# Patient Record
Sex: Female | Born: 1940 | Race: White | Hispanic: No | Smoking: Never smoker
Health system: Southern US, Community
[De-identification: ages and names within clinical notes are randomized; demographics above are authoritative.]

## PROBLEM LIST (undated history)

## (undated) DIAGNOSIS — J45909 Unspecified asthma, uncomplicated: Secondary | ICD-10-CM

## (undated) DIAGNOSIS — R519 Headache, unspecified: Secondary | ICD-10-CM

## (undated) DIAGNOSIS — R0600 Dyspnea, unspecified: Secondary | ICD-10-CM

## (undated) DIAGNOSIS — G4733 Obstructive sleep apnea (adult) (pediatric): Secondary | ICD-10-CM

## (undated) DIAGNOSIS — M1990 Unspecified osteoarthritis, unspecified site: Secondary | ICD-10-CM

## (undated) DIAGNOSIS — I10 Essential (primary) hypertension: Secondary | ICD-10-CM

## (undated) DIAGNOSIS — K219 Gastro-esophageal reflux disease without esophagitis: Secondary | ICD-10-CM

## (undated) DIAGNOSIS — N179 Acute kidney failure, unspecified: Secondary | ICD-10-CM

## (undated) DIAGNOSIS — I509 Heart failure, unspecified: Secondary | ICD-10-CM

## (undated) DIAGNOSIS — I480 Paroxysmal atrial fibrillation: Secondary | ICD-10-CM

## (undated) DIAGNOSIS — G8929 Other chronic pain: Secondary | ICD-10-CM

## (undated) DIAGNOSIS — E6609 Other obesity due to excess calories: Secondary | ICD-10-CM

## (undated) DIAGNOSIS — E66812 Other obesity due to excess calories: Secondary | ICD-10-CM

## (undated) HISTORY — PX: ABDOMINAL SURGERY: SHX537

## (undated) HISTORY — PX: CHOLECYSTECTOMY: SHX55

---

## 2000-12-02 ENCOUNTER — Encounter: Payer: Self-pay | Admitting: Family Medicine

## 2000-12-02 ENCOUNTER — Encounter: Admission: RE | Admit: 2000-12-02 | Discharge: 2000-12-02 | Payer: Self-pay | Admitting: Family Medicine

## 2001-01-27 ENCOUNTER — Ambulatory Visit (HOSPITAL_COMMUNITY): Admission: RE | Admit: 2001-01-27 | Discharge: 2001-01-27 | Payer: Self-pay | Admitting: Gastroenterology

## 2005-04-07 ENCOUNTER — Encounter: Admission: RE | Admit: 2005-04-07 | Discharge: 2005-04-07 | Payer: Self-pay | Admitting: Family Medicine

## 2012-12-03 ENCOUNTER — Other Ambulatory Visit: Payer: Self-pay | Admitting: Rehabilitation

## 2012-12-03 DIAGNOSIS — M542 Cervicalgia: Secondary | ICD-10-CM

## 2012-12-18 ENCOUNTER — Ambulatory Visit
Admission: RE | Admit: 2012-12-18 | Discharge: 2012-12-18 | Disposition: A | Discharge status: Home / Self Care | Payer: Medicare Other | Source: Ambulatory Visit | Attending: Rehabilitation | Admitting: Rehabilitation

## 2013-04-05 ENCOUNTER — Ambulatory Visit
Admission: RE | Admit: 2013-04-05 | Discharge: 2013-04-05 | Disposition: A | Discharge status: Home / Self Care | Payer: Medicare Other | Source: Ambulatory Visit | Attending: Family Medicine | Admitting: Family Medicine

## 2013-04-05 ENCOUNTER — Other Ambulatory Visit: Payer: Self-pay | Admitting: Family Medicine

## 2013-04-05 DIAGNOSIS — R51 Headache: Secondary | ICD-10-CM

## 2013-10-17 DIAGNOSIS — J449 Chronic obstructive pulmonary disease, unspecified: Secondary | ICD-10-CM | POA: Diagnosis not present

## 2013-10-17 DIAGNOSIS — J45901 Unspecified asthma with (acute) exacerbation: Secondary | ICD-10-CM | POA: Diagnosis not present

## 2013-10-17 DIAGNOSIS — K219 Gastro-esophageal reflux disease without esophagitis: Secondary | ICD-10-CM | POA: Diagnosis not present

## 2013-10-17 DIAGNOSIS — J479 Bronchiectasis, uncomplicated: Secondary | ICD-10-CM | POA: Diagnosis not present

## 2013-10-17 DIAGNOSIS — R059 Cough, unspecified: Secondary | ICD-10-CM | POA: Diagnosis not present

## 2013-11-04 DIAGNOSIS — S46819A Strain of other muscles, fascia and tendons at shoulder and upper arm level, unspecified arm, initial encounter: Secondary | ICD-10-CM | POA: Diagnosis not present

## 2013-11-04 DIAGNOSIS — S43499A Other sprain of unspecified shoulder joint, initial encounter: Secondary | ICD-10-CM | POA: Diagnosis not present

## 2013-11-14 DIAGNOSIS — F411 Generalized anxiety disorder: Secondary | ICD-10-CM | POA: Diagnosis not present

## 2013-11-14 DIAGNOSIS — J449 Chronic obstructive pulmonary disease, unspecified: Secondary | ICD-10-CM | POA: Diagnosis not present

## 2013-11-14 DIAGNOSIS — E669 Obesity, unspecified: Secondary | ICD-10-CM | POA: Diagnosis not present

## 2013-11-14 DIAGNOSIS — I10 Essential (primary) hypertension: Secondary | ICD-10-CM | POA: Diagnosis not present

## 2013-11-23 DIAGNOSIS — R51 Headache: Secondary | ICD-10-CM | POA: Diagnosis not present

## 2013-11-23 DIAGNOSIS — M4310 Spondylolisthesis, site unspecified: Secondary | ICD-10-CM | POA: Diagnosis not present

## 2013-11-23 DIAGNOSIS — M542 Cervicalgia: Secondary | ICD-10-CM | POA: Diagnosis not present

## 2013-12-14 DIAGNOSIS — E669 Obesity, unspecified: Secondary | ICD-10-CM | POA: Diagnosis not present

## 2013-12-15 DIAGNOSIS — G4733 Obstructive sleep apnea (adult) (pediatric): Secondary | ICD-10-CM | POA: Diagnosis not present

## 2013-12-15 DIAGNOSIS — J449 Chronic obstructive pulmonary disease, unspecified: Secondary | ICD-10-CM | POA: Diagnosis not present

## 2013-12-15 DIAGNOSIS — K219 Gastro-esophageal reflux disease without esophagitis: Secondary | ICD-10-CM | POA: Diagnosis not present

## 2013-12-15 DIAGNOSIS — J45901 Unspecified asthma with (acute) exacerbation: Secondary | ICD-10-CM | POA: Diagnosis not present

## 2013-12-21 DIAGNOSIS — M47812 Spondylosis without myelopathy or radiculopathy, cervical region: Secondary | ICD-10-CM | POA: Diagnosis not present

## 2013-12-21 DIAGNOSIS — M542 Cervicalgia: Secondary | ICD-10-CM | POA: Diagnosis not present

## 2013-12-21 DIAGNOSIS — M67919 Unspecified disorder of synovium and tendon, unspecified shoulder: Secondary | ICD-10-CM | POA: Diagnosis not present

## 2013-12-21 DIAGNOSIS — M719 Bursopathy, unspecified: Secondary | ICD-10-CM | POA: Diagnosis not present

## 2014-01-25 DIAGNOSIS — L570 Actinic keratosis: Secondary | ICD-10-CM | POA: Diagnosis not present

## 2014-01-25 DIAGNOSIS — L723 Sebaceous cyst: Secondary | ICD-10-CM | POA: Diagnosis not present

## 2014-01-25 DIAGNOSIS — M4712 Other spondylosis with myelopathy, cervical region: Secondary | ICD-10-CM | POA: Diagnosis not present

## 2014-01-25 DIAGNOSIS — Q828 Other specified congenital malformations of skin: Secondary | ICD-10-CM | POA: Diagnosis not present

## 2014-01-25 DIAGNOSIS — D239 Other benign neoplasm of skin, unspecified: Secondary | ICD-10-CM | POA: Diagnosis not present

## 2014-01-25 DIAGNOSIS — L821 Other seborrheic keratosis: Secondary | ICD-10-CM | POA: Diagnosis not present

## 2014-01-25 DIAGNOSIS — L719 Rosacea, unspecified: Secondary | ICD-10-CM | POA: Diagnosis not present

## 2014-01-25 DIAGNOSIS — D219 Benign neoplasm of connective and other soft tissue, unspecified: Secondary | ICD-10-CM | POA: Diagnosis not present

## 2014-01-25 DIAGNOSIS — D692 Other nonthrombocytopenic purpura: Secondary | ICD-10-CM | POA: Diagnosis not present

## 2014-01-25 DIAGNOSIS — D1801 Hemangioma of skin and subcutaneous tissue: Secondary | ICD-10-CM | POA: Diagnosis not present

## 2014-01-26 DIAGNOSIS — G4733 Obstructive sleep apnea (adult) (pediatric): Secondary | ICD-10-CM | POA: Diagnosis not present

## 2014-01-26 DIAGNOSIS — J449 Chronic obstructive pulmonary disease, unspecified: Secondary | ICD-10-CM | POA: Diagnosis not present

## 2014-01-26 DIAGNOSIS — J479 Bronchiectasis, uncomplicated: Secondary | ICD-10-CM | POA: Diagnosis not present

## 2014-02-16 DIAGNOSIS — M542 Cervicalgia: Secondary | ICD-10-CM | POA: Diagnosis not present

## 2014-02-16 DIAGNOSIS — M47812 Spondylosis without myelopathy or radiculopathy, cervical region: Secondary | ICD-10-CM | POA: Diagnosis not present

## 2014-02-16 DIAGNOSIS — IMO0002 Reserved for concepts with insufficient information to code with codable children: Secondary | ICD-10-CM | POA: Diagnosis not present

## 2014-02-16 DIAGNOSIS — M75100 Unspecified rotator cuff tear or rupture of unspecified shoulder, not specified as traumatic: Secondary | ICD-10-CM | POA: Diagnosis not present

## 2014-02-20 DIAGNOSIS — M47812 Spondylosis without myelopathy or radiculopathy, cervical region: Secondary | ICD-10-CM | POA: Diagnosis not present

## 2014-02-20 DIAGNOSIS — M25519 Pain in unspecified shoulder: Secondary | ICD-10-CM | POA: Diagnosis not present

## 2014-02-22 DIAGNOSIS — Z1231 Encounter for screening mammogram for malignant neoplasm of breast: Secondary | ICD-10-CM | POA: Diagnosis not present

## 2014-02-22 DIAGNOSIS — M47812 Spondylosis without myelopathy or radiculopathy, cervical region: Secondary | ICD-10-CM | POA: Diagnosis not present

## 2014-02-22 DIAGNOSIS — M25519 Pain in unspecified shoulder: Secondary | ICD-10-CM | POA: Diagnosis not present

## 2014-02-23 DIAGNOSIS — Z0000 Encounter for general adult medical examination without abnormal findings: Secondary | ICD-10-CM | POA: Diagnosis not present

## 2014-02-27 DIAGNOSIS — M47812 Spondylosis without myelopathy or radiculopathy, cervical region: Secondary | ICD-10-CM | POA: Diagnosis not present

## 2014-02-27 DIAGNOSIS — M25519 Pain in unspecified shoulder: Secondary | ICD-10-CM | POA: Diagnosis not present

## 2014-03-01 DIAGNOSIS — J449 Chronic obstructive pulmonary disease, unspecified: Secondary | ICD-10-CM | POA: Diagnosis not present

## 2014-03-01 DIAGNOSIS — G4733 Obstructive sleep apnea (adult) (pediatric): Secondary | ICD-10-CM | POA: Diagnosis not present

## 2014-03-02 DIAGNOSIS — M25519 Pain in unspecified shoulder: Secondary | ICD-10-CM | POA: Diagnosis not present

## 2014-03-02 DIAGNOSIS — M47812 Spondylosis without myelopathy or radiculopathy, cervical region: Secondary | ICD-10-CM | POA: Diagnosis not present

## 2014-03-08 DIAGNOSIS — M47812 Spondylosis without myelopathy or radiculopathy, cervical region: Secondary | ICD-10-CM | POA: Diagnosis not present

## 2014-03-08 DIAGNOSIS — M25519 Pain in unspecified shoulder: Secondary | ICD-10-CM | POA: Diagnosis not present

## 2014-03-10 DIAGNOSIS — M47812 Spondylosis without myelopathy or radiculopathy, cervical region: Secondary | ICD-10-CM | POA: Diagnosis not present

## 2014-03-10 DIAGNOSIS — M25519 Pain in unspecified shoulder: Secondary | ICD-10-CM | POA: Diagnosis not present

## 2014-03-13 DIAGNOSIS — M25519 Pain in unspecified shoulder: Secondary | ICD-10-CM | POA: Diagnosis not present

## 2014-03-13 DIAGNOSIS — M47812 Spondylosis without myelopathy or radiculopathy, cervical region: Secondary | ICD-10-CM | POA: Diagnosis not present

## 2014-03-15 DIAGNOSIS — M47812 Spondylosis without myelopathy or radiculopathy, cervical region: Secondary | ICD-10-CM | POA: Diagnosis not present

## 2014-03-15 DIAGNOSIS — M25519 Pain in unspecified shoulder: Secondary | ICD-10-CM | POA: Diagnosis not present

## 2014-03-21 DIAGNOSIS — M25519 Pain in unspecified shoulder: Secondary | ICD-10-CM | POA: Diagnosis not present

## 2014-03-21 DIAGNOSIS — M47812 Spondylosis without myelopathy or radiculopathy, cervical region: Secondary | ICD-10-CM | POA: Diagnosis not present

## 2014-03-24 DIAGNOSIS — M47812 Spondylosis without myelopathy or radiculopathy, cervical region: Secondary | ICD-10-CM | POA: Diagnosis not present

## 2014-03-24 DIAGNOSIS — M25519 Pain in unspecified shoulder: Secondary | ICD-10-CM | POA: Diagnosis not present

## 2014-03-27 DIAGNOSIS — M47812 Spondylosis without myelopathy or radiculopathy, cervical region: Secondary | ICD-10-CM | POA: Diagnosis not present

## 2014-03-27 DIAGNOSIS — M25519 Pain in unspecified shoulder: Secondary | ICD-10-CM | POA: Diagnosis not present

## 2014-03-29 DIAGNOSIS — M25519 Pain in unspecified shoulder: Secondary | ICD-10-CM | POA: Diagnosis not present

## 2014-03-29 DIAGNOSIS — M47812 Spondylosis without myelopathy or radiculopathy, cervical region: Secondary | ICD-10-CM | POA: Diagnosis not present

## 2014-03-30 DIAGNOSIS — M47812 Spondylosis without myelopathy or radiculopathy, cervical region: Secondary | ICD-10-CM | POA: Diagnosis not present

## 2014-03-30 DIAGNOSIS — M67919 Unspecified disorder of synovium and tendon, unspecified shoulder: Secondary | ICD-10-CM | POA: Diagnosis not present

## 2014-03-30 DIAGNOSIS — M542 Cervicalgia: Secondary | ICD-10-CM | POA: Diagnosis not present

## 2014-03-30 DIAGNOSIS — M719 Bursopathy, unspecified: Secondary | ICD-10-CM | POA: Diagnosis not present

## 2014-04-03 DIAGNOSIS — M47812 Spondylosis without myelopathy or radiculopathy, cervical region: Secondary | ICD-10-CM | POA: Diagnosis not present

## 2014-04-03 DIAGNOSIS — M25519 Pain in unspecified shoulder: Secondary | ICD-10-CM | POA: Diagnosis not present

## 2014-04-05 DIAGNOSIS — M25519 Pain in unspecified shoulder: Secondary | ICD-10-CM | POA: Diagnosis not present

## 2014-04-05 DIAGNOSIS — M47812 Spondylosis without myelopathy or radiculopathy, cervical region: Secondary | ICD-10-CM | POA: Diagnosis not present

## 2014-04-10 DIAGNOSIS — M47812 Spondylosis without myelopathy or radiculopathy, cervical region: Secondary | ICD-10-CM | POA: Diagnosis not present

## 2014-04-10 DIAGNOSIS — M25519 Pain in unspecified shoulder: Secondary | ICD-10-CM | POA: Diagnosis not present

## 2014-04-12 DIAGNOSIS — M25519 Pain in unspecified shoulder: Secondary | ICD-10-CM | POA: Diagnosis not present

## 2014-04-12 DIAGNOSIS — M47812 Spondylosis without myelopathy or radiculopathy, cervical region: Secondary | ICD-10-CM | POA: Diagnosis not present

## 2014-04-17 DIAGNOSIS — M25519 Pain in unspecified shoulder: Secondary | ICD-10-CM | POA: Diagnosis not present

## 2014-04-17 DIAGNOSIS — M47812 Spondylosis without myelopathy or radiculopathy, cervical region: Secondary | ICD-10-CM | POA: Diagnosis not present

## 2014-04-19 DIAGNOSIS — M25519 Pain in unspecified shoulder: Secondary | ICD-10-CM | POA: Diagnosis not present

## 2014-04-19 DIAGNOSIS — M47812 Spondylosis without myelopathy or radiculopathy, cervical region: Secondary | ICD-10-CM | POA: Diagnosis not present

## 2014-04-26 DIAGNOSIS — M4712 Other spondylosis with myelopathy, cervical region: Secondary | ICD-10-CM | POA: Diagnosis not present

## 2014-04-27 DIAGNOSIS — Z780 Asymptomatic menopausal state: Secondary | ICD-10-CM | POA: Diagnosis not present

## 2014-05-10 DIAGNOSIS — R51 Headache: Secondary | ICD-10-CM | POA: Diagnosis not present

## 2014-05-10 DIAGNOSIS — IMO0001 Reserved for inherently not codable concepts without codable children: Secondary | ICD-10-CM | POA: Diagnosis not present

## 2014-05-10 DIAGNOSIS — G518 Other disorders of facial nerve: Secondary | ICD-10-CM | POA: Diagnosis not present

## 2014-05-10 DIAGNOSIS — M542 Cervicalgia: Secondary | ICD-10-CM | POA: Diagnosis not present

## 2014-05-10 DIAGNOSIS — M4712 Other spondylosis with myelopathy, cervical region: Secondary | ICD-10-CM | POA: Diagnosis not present

## 2014-05-24 DIAGNOSIS — M4712 Other spondylosis with myelopathy, cervical region: Secondary | ICD-10-CM | POA: Diagnosis not present

## 2014-05-24 DIAGNOSIS — G518 Other disorders of facial nerve: Secondary | ICD-10-CM | POA: Diagnosis not present

## 2014-05-24 DIAGNOSIS — M542 Cervicalgia: Secondary | ICD-10-CM | POA: Diagnosis not present

## 2014-05-24 DIAGNOSIS — IMO0001 Reserved for inherently not codable concepts without codable children: Secondary | ICD-10-CM | POA: Diagnosis not present

## 2014-05-24 DIAGNOSIS — R51 Headache: Secondary | ICD-10-CM | POA: Diagnosis not present

## 2014-06-01 ENCOUNTER — Other Ambulatory Visit: Payer: Self-pay | Admitting: Rehabilitation

## 2014-06-01 DIAGNOSIS — M545 Low back pain, unspecified: Secondary | ICD-10-CM

## 2014-06-01 DIAGNOSIS — M67919 Unspecified disorder of synovium and tendon, unspecified shoulder: Secondary | ICD-10-CM | POA: Diagnosis not present

## 2014-06-01 DIAGNOSIS — M47817 Spondylosis without myelopathy or radiculopathy, lumbosacral region: Secondary | ICD-10-CM | POA: Diagnosis not present

## 2014-06-01 DIAGNOSIS — M719 Bursopathy, unspecified: Secondary | ICD-10-CM | POA: Diagnosis not present

## 2014-06-01 DIAGNOSIS — M47812 Spondylosis without myelopathy or radiculopathy, cervical region: Secondary | ICD-10-CM | POA: Diagnosis not present

## 2014-06-02 DIAGNOSIS — E669 Obesity, unspecified: Secondary | ICD-10-CM | POA: Diagnosis not present

## 2014-06-02 DIAGNOSIS — Z23 Encounter for immunization: Secondary | ICD-10-CM | POA: Diagnosis not present

## 2014-06-02 DIAGNOSIS — J449 Chronic obstructive pulmonary disease, unspecified: Secondary | ICD-10-CM | POA: Diagnosis not present

## 2014-06-02 DIAGNOSIS — Z0000 Encounter for general adult medical examination without abnormal findings: Secondary | ICD-10-CM | POA: Diagnosis not present

## 2014-06-02 DIAGNOSIS — I10 Essential (primary) hypertension: Secondary | ICD-10-CM | POA: Diagnosis not present

## 2014-06-02 DIAGNOSIS — F411 Generalized anxiety disorder: Secondary | ICD-10-CM | POA: Diagnosis not present

## 2014-06-07 DIAGNOSIS — M542 Cervicalgia: Secondary | ICD-10-CM | POA: Diagnosis not present

## 2014-06-07 DIAGNOSIS — R51 Headache: Secondary | ICD-10-CM | POA: Diagnosis not present

## 2014-06-07 DIAGNOSIS — K219 Gastro-esophageal reflux disease without esophagitis: Secondary | ICD-10-CM | POA: Diagnosis not present

## 2014-06-07 DIAGNOSIS — J449 Chronic obstructive pulmonary disease, unspecified: Secondary | ICD-10-CM | POA: Diagnosis not present

## 2014-06-07 DIAGNOSIS — M4712 Other spondylosis with myelopathy, cervical region: Secondary | ICD-10-CM | POA: Diagnosis not present

## 2014-06-07 DIAGNOSIS — G4733 Obstructive sleep apnea (adult) (pediatric): Secondary | ICD-10-CM | POA: Diagnosis not present

## 2014-06-07 DIAGNOSIS — J479 Bronchiectasis, uncomplicated: Secondary | ICD-10-CM | POA: Diagnosis not present

## 2014-06-07 DIAGNOSIS — G518 Other disorders of facial nerve: Secondary | ICD-10-CM | POA: Diagnosis not present

## 2014-06-07 DIAGNOSIS — IMO0001 Reserved for inherently not codable concepts without codable children: Secondary | ICD-10-CM | POA: Diagnosis not present

## 2014-06-10 ENCOUNTER — Ambulatory Visit
Admission: RE | Admit: 2014-06-10 | Discharge: 2014-06-10 | Disposition: A | Discharge status: Home / Self Care | Payer: Medicare Other | Source: Ambulatory Visit | Attending: Rehabilitation | Admitting: Rehabilitation

## 2014-06-10 DIAGNOSIS — M47817 Spondylosis without myelopathy or radiculopathy, lumbosacral region: Secondary | ICD-10-CM | POA: Diagnosis not present

## 2014-06-10 DIAGNOSIS — M545 Low back pain, unspecified: Secondary | ICD-10-CM

## 2014-06-16 DIAGNOSIS — M2140 Flat foot [pes planus] (acquired), unspecified foot: Secondary | ICD-10-CM | POA: Diagnosis not present

## 2014-06-16 DIAGNOSIS — M25579 Pain in unspecified ankle and joints of unspecified foot: Secondary | ICD-10-CM | POA: Diagnosis not present

## 2014-06-21 DIAGNOSIS — R51 Headache: Secondary | ICD-10-CM | POA: Diagnosis not present

## 2014-06-21 DIAGNOSIS — IMO0001 Reserved for inherently not codable concepts without codable children: Secondary | ICD-10-CM | POA: Diagnosis not present

## 2014-06-21 DIAGNOSIS — M4712 Other spondylosis with myelopathy, cervical region: Secondary | ICD-10-CM | POA: Diagnosis not present

## 2014-06-21 DIAGNOSIS — G518 Other disorders of facial nerve: Secondary | ICD-10-CM | POA: Diagnosis not present

## 2014-06-21 DIAGNOSIS — M542 Cervicalgia: Secondary | ICD-10-CM | POA: Diagnosis not present

## 2014-06-22 DIAGNOSIS — M545 Low back pain, unspecified: Secondary | ICD-10-CM | POA: Diagnosis not present

## 2014-06-22 DIAGNOSIS — M5137 Other intervertebral disc degeneration, lumbosacral region: Secondary | ICD-10-CM | POA: Diagnosis not present

## 2014-06-22 DIAGNOSIS — M47817 Spondylosis without myelopathy or radiculopathy, lumbosacral region: Secondary | ICD-10-CM | POA: Diagnosis not present

## 2014-06-22 DIAGNOSIS — M48061 Spinal stenosis, lumbar region without neurogenic claudication: Secondary | ICD-10-CM | POA: Diagnosis not present

## 2014-07-03 DIAGNOSIS — M545 Low back pain, unspecified: Secondary | ICD-10-CM | POA: Diagnosis not present

## 2014-07-03 DIAGNOSIS — M47817 Spondylosis without myelopathy or radiculopathy, lumbosacral region: Secondary | ICD-10-CM | POA: Diagnosis not present

## 2014-07-05 DIAGNOSIS — M47817 Spondylosis without myelopathy or radiculopathy, lumbosacral region: Secondary | ICD-10-CM | POA: Diagnosis not present

## 2014-07-05 DIAGNOSIS — M545 Low back pain, unspecified: Secondary | ICD-10-CM | POA: Diagnosis not present

## 2014-07-07 DIAGNOSIS — M47817 Spondylosis without myelopathy or radiculopathy, lumbosacral region: Secondary | ICD-10-CM | POA: Diagnosis not present

## 2014-07-07 DIAGNOSIS — M545 Low back pain, unspecified: Secondary | ICD-10-CM | POA: Diagnosis not present

## 2014-07-10 DIAGNOSIS — M47817 Spondylosis without myelopathy or radiculopathy, lumbosacral region: Secondary | ICD-10-CM | POA: Diagnosis not present

## 2014-07-10 DIAGNOSIS — M545 Low back pain, unspecified: Secondary | ICD-10-CM | POA: Diagnosis not present

## 2014-07-12 DIAGNOSIS — M545 Low back pain, unspecified: Secondary | ICD-10-CM | POA: Diagnosis not present

## 2014-07-12 DIAGNOSIS — M47817 Spondylosis without myelopathy or radiculopathy, lumbosacral region: Secondary | ICD-10-CM | POA: Diagnosis not present

## 2014-07-13 DIAGNOSIS — M545 Low back pain: Secondary | ICD-10-CM | POA: Diagnosis not present

## 2014-07-17 DIAGNOSIS — M545 Low back pain: Secondary | ICD-10-CM | POA: Diagnosis not present

## 2014-07-17 DIAGNOSIS — Z23 Encounter for immunization: Secondary | ICD-10-CM | POA: Diagnosis not present

## 2014-07-18 DIAGNOSIS — Z1211 Encounter for screening for malignant neoplasm of colon: Secondary | ICD-10-CM | POA: Diagnosis not present

## 2014-07-18 DIAGNOSIS — G4730 Sleep apnea, unspecified: Secondary | ICD-10-CM | POA: Diagnosis not present

## 2014-07-19 DIAGNOSIS — M545 Low back pain: Secondary | ICD-10-CM | POA: Diagnosis not present

## 2014-07-21 DIAGNOSIS — M545 Low back pain: Secondary | ICD-10-CM | POA: Diagnosis not present

## 2014-07-24 DIAGNOSIS — M545 Low back pain: Secondary | ICD-10-CM | POA: Diagnosis not present

## 2014-07-26 DIAGNOSIS — M545 Low back pain: Secondary | ICD-10-CM | POA: Diagnosis not present

## 2014-07-27 DIAGNOSIS — M545 Low back pain: Secondary | ICD-10-CM | POA: Diagnosis not present

## 2014-07-27 DIAGNOSIS — M47896 Other spondylosis, lumbar region: Secondary | ICD-10-CM | POA: Diagnosis not present

## 2014-07-27 DIAGNOSIS — M5136 Other intervertebral disc degeneration, lumbar region: Secondary | ICD-10-CM | POA: Diagnosis not present

## 2014-07-27 DIAGNOSIS — M4806 Spinal stenosis, lumbar region: Secondary | ICD-10-CM | POA: Diagnosis not present

## 2014-07-28 DIAGNOSIS — M545 Low back pain: Secondary | ICD-10-CM | POA: Diagnosis not present

## 2014-07-31 DIAGNOSIS — G4733 Obstructive sleep apnea (adult) (pediatric): Secondary | ICD-10-CM | POA: Diagnosis not present

## 2014-07-31 DIAGNOSIS — J479 Bronchiectasis, uncomplicated: Secondary | ICD-10-CM | POA: Diagnosis not present

## 2014-07-31 DIAGNOSIS — J449 Chronic obstructive pulmonary disease, unspecified: Secondary | ICD-10-CM | POA: Diagnosis not present

## 2014-07-31 DIAGNOSIS — M545 Low back pain: Secondary | ICD-10-CM | POA: Diagnosis not present

## 2014-07-31 DIAGNOSIS — J309 Allergic rhinitis, unspecified: Secondary | ICD-10-CM | POA: Diagnosis not present

## 2014-08-02 DIAGNOSIS — M545 Low back pain: Secondary | ICD-10-CM | POA: Diagnosis not present

## 2014-08-03 DIAGNOSIS — M545 Low back pain: Secondary | ICD-10-CM | POA: Diagnosis not present

## 2014-08-07 DIAGNOSIS — M545 Low back pain: Secondary | ICD-10-CM | POA: Diagnosis not present

## 2014-08-09 DIAGNOSIS — M545 Low back pain: Secondary | ICD-10-CM | POA: Diagnosis not present

## 2014-08-11 DIAGNOSIS — M545 Low back pain: Secondary | ICD-10-CM | POA: Diagnosis not present

## 2014-08-22 DIAGNOSIS — Z1211 Encounter for screening for malignant neoplasm of colon: Secondary | ICD-10-CM | POA: Diagnosis not present

## 2014-08-22 DIAGNOSIS — K5730 Diverticulosis of large intestine without perforation or abscess without bleeding: Secondary | ICD-10-CM | POA: Diagnosis not present

## 2014-08-29 DIAGNOSIS — M47896 Other spondylosis, lumbar region: Secondary | ICD-10-CM | POA: Diagnosis not present

## 2014-09-13 DIAGNOSIS — M25572 Pain in left ankle and joints of left foot: Secondary | ICD-10-CM | POA: Diagnosis not present

## 2014-09-18 DIAGNOSIS — M47896 Other spondylosis, lumbar region: Secondary | ICD-10-CM | POA: Diagnosis not present

## 2014-09-20 DIAGNOSIS — M25572 Pain in left ankle and joints of left foot: Secondary | ICD-10-CM | POA: Diagnosis not present

## 2014-09-28 DIAGNOSIS — M25572 Pain in left ankle and joints of left foot: Secondary | ICD-10-CM | POA: Diagnosis not present

## 2014-09-28 DIAGNOSIS — M19072 Primary osteoarthritis, left ankle and foot: Secondary | ICD-10-CM | POA: Diagnosis not present

## 2014-09-28 DIAGNOSIS — M2140 Flat foot [pes planus] (acquired), unspecified foot: Secondary | ICD-10-CM | POA: Diagnosis not present

## 2014-11-15 DIAGNOSIS — M47896 Other spondylosis, lumbar region: Secondary | ICD-10-CM | POA: Diagnosis not present

## 2015-09-13 ENCOUNTER — Emergency Department (HOSPITAL_COMMUNITY): Payer: Medicare HMO

## 2015-09-13 ENCOUNTER — Encounter (HOSPITAL_COMMUNITY): Payer: Self-pay | Admitting: Emergency Medicine

## 2015-09-13 ENCOUNTER — Emergency Department (HOSPITAL_COMMUNITY)
Admission: EM | Admit: 2015-09-13 | Discharge: 2015-09-13 | Disposition: A | Discharge status: Home / Self Care | Payer: Medicare HMO | Attending: Emergency Medicine | Admitting: Emergency Medicine

## 2015-09-13 DIAGNOSIS — Y9389 Activity, other specified: Secondary | ICD-10-CM | POA: Insufficient documentation

## 2015-09-13 DIAGNOSIS — J45909 Unspecified asthma, uncomplicated: Secondary | ICD-10-CM | POA: Insufficient documentation

## 2015-09-13 DIAGNOSIS — S6992XA Unspecified injury of left wrist, hand and finger(s), initial encounter: Secondary | ICD-10-CM | POA: Diagnosis present

## 2015-09-13 DIAGNOSIS — Y998 Other external cause status: Secondary | ICD-10-CM | POA: Diagnosis not present

## 2015-09-13 DIAGNOSIS — S60212A Contusion of left wrist, initial encounter: Secondary | ICD-10-CM | POA: Insufficient documentation

## 2015-09-13 DIAGNOSIS — I10 Essential (primary) hypertension: Secondary | ICD-10-CM | POA: Diagnosis not present

## 2015-09-13 DIAGNOSIS — W010XXA Fall on same level from slipping, tripping and stumbling without subsequent striking against object, initial encounter: Secondary | ICD-10-CM | POA: Diagnosis not present

## 2015-09-13 DIAGNOSIS — Z8739 Personal history of other diseases of the musculoskeletal system and connective tissue: Secondary | ICD-10-CM | POA: Insufficient documentation

## 2015-09-13 DIAGNOSIS — M25532 Pain in left wrist: Secondary | ICD-10-CM

## 2015-09-13 DIAGNOSIS — Y92009 Unspecified place in unspecified non-institutional (private) residence as the place of occurrence of the external cause: Secondary | ICD-10-CM | POA: Insufficient documentation

## 2015-09-13 HISTORY — DX: Unspecified asthma, uncomplicated: J45.909

## 2015-09-13 HISTORY — DX: Unspecified osteoarthritis, unspecified site: M19.90

## 2015-09-13 HISTORY — DX: Essential (primary) hypertension: I10

## 2015-09-13 NOTE — Discharge Instructions (Signed)
Continue taking home medications as directed.  May wish to apply heat to help stimulate blood flow to hematoma and promote healing. Area will likely bruise and turn black -- blue-- green-- yellow which is normal. Follow-up with your primary care physician. Heat Therapy Heat therapy can help ease sore, stiff, injured, and tight muscles and joints. Heat relaxes your muscles, which may help ease your pain.  RISKS AND COMPLICATIONS If you have any of the following conditions, do not use heat therapy unless your health care provider has approved:  Poor circulation.  Healing wounds or scarred skin in the area being treated.  Diabetes, heart disease, or high blood pressure.  Not being able to feel (numbness) the area being treated.  Unusual swelling of the area being treated.  Active infections.  Blood clots.  Cancer.  Inability to communicate pain. This may include young children and people who have problems with their brain function (dementia).  Pregnancy. Heat therapy should only be used on old, pre-existing, or long-lasting (chronic) injuries. Do not use heat therapy on new injuries unless directed by your health care provider. HOW TO USE HEAT THERAPY There are several different kinds of heat therapy, including:  Moist heat pack.  Warm water bath.  Hot water bottle.  Electric heating pad.  Heated gel pack.  Heated wrap.  Electric heating pad. Use the heat therapy method suggested by your health care provider. Follow your health care provider's instructions on when and how to use heat therapy. GENERAL HEAT THERAPY RECOMMENDATIONS  Do not sleep while using heat therapy. Only use heat therapy while you are awake.  Your skin may turn pink while using heat therapy. Do not use heat therapy if your skin turns red.  Do not use heat therapy if you have new pain.  High heat or long exposure to heat can cause burns. Be careful when using heat therapy to avoid burning your  skin.  Do not use heat therapy on areas of your skin that are already irritated, such as with a rash or sunburn. SEEK MEDICAL CARE IF:  You have blisters, redness, swelling, or numbness.  You have new pain.  Your pain is worse. MAKE SURE YOU:  Understand these instructions.  Will watch your condition.  Will get help right away if you are not doing well or get worse.   This information is not intended to replace advice given to you by your health care provider. Make sure you discuss any questions you have with your health care provider.   Document Released: 12/22/2011 Document Revised: 10/20/2014 Document Reviewed: 11/22/2013 Elsevier Interactive Patient Education [employer].  Return to the ED for new or worsening symptoms.

## 2015-09-13 NOTE — ED Provider Notes (Signed)
CSN: [id number]     Arrival date & time 09/13/15  1907 History  By signing my name below, I, Evelene Croon, attest that this documentation has been prepared under the direction and in the presence of non-physician practitioner, Quincy Carnes, PA-C. Electronically Signed: Evelene Croon, Scribe. 09/13/2015. 8:51 PM.    Chief Complaint  Patient presents with  . Fall    The history is provided by the patient. No language interpreter was used.    HPI Comments:  Alisha Terrell is a 74 y.o. female who presents to the Emergency Department s/p fall PTA complaining of moderate constant left wrist pain following the incident. She tripped on uneven bricks on the patio and fell onto outstretched hand. She denies pain to fingers and elbow. No head injury and LOC. Pt takes ASA daily;  no other blood thinners. No alleviating factors noted. Pt has no other complaints or symptoms at this time.  Pt is right hand dominant.   Past Medical History  Diagnosis Date  . Asthma   . Hypertension   . Arthritis    Past Surgical History  Procedure Laterality Date  . Abdominal surgery    . Cholecystectomy    . Cesarean section     No family history on file. Social History  Substance Use Topics  . Smoking status: Passive Smoke Exposure - Never Smoker    Types: Cigarettes  . Smokeless tobacco: None  . Alcohol Use: Yes   OB History    No data available     Review of Systems  Constitutional: Negative for fever and chills.  Respiratory: Negative for shortness of breath.   Cardiovascular: Negative for chest pain.  Musculoskeletal: Positive for arthralgias (left wrist).  All other systems reviewed and are negative.   Allergies  Codeine  Home Medications   Prior to Admission medications   Not on File   BP 152/76 mmHg  Pulse 77  Temp(Src) 98 F (36.7 C) (Oral)  Resp 16  Ht 4\' 11"  (1.499 m)  Wt 199 lb (90.266 kg)  BMI 40.17 kg/m2  SpO2 98%   Physical Exam  Constitutional: She is oriented to  person, place, and time. She appears well-developed and well-nourished.  HENT:  Head: Normocephalic and atraumatic.  Mouth/Throat: Oropharynx is clear and moist.  Eyes: Conjunctivae and EOM are normal. Pupils are equal, round, and reactive to light.  Neck: Normal range of motion.  Cardiovascular: Normal rate, regular rhythm and normal heart sounds.   Pulmonary/Chest: Effort normal and breath sounds normal. No respiratory distress. She has no wheezes.  Abdominal: Soft. Bowel sounds are normal.  Musculoskeletal: Normal range of motion.  Left radial wrist with hematoma and bruising noted, no acute bony deformity; full range of motion maintained, minimal pain; strong radial pulse and cap refill, normal sensation throughout, normal grip strength  Neurological: She is alert and oriented to person, place, and time.  Skin: Skin is warm and dry.  Psychiatric: She has a normal mood and affect.  Nursing note and vitals reviewed.   ED Course  ORTHOPEDIC INJURY TREATMENT Date/Time: 09/13/2015 9:00 PM Performed by: Larene Pickett Authorized by: Larene Pickett Consent: Verbal consent obtained. Risks and benefits: risks, benefits and alternatives were discussed Consent given by: patient Patient understanding: patient states understanding of the procedure being performed Required items: required blood products, implants, devices, and special equipment available Patient identity confirmed: verbally with patient Injury location: wrist Location details: left wrist Injury type: soft tissue Pre-procedure neurovascular assessment: neurovascularly intact  Immobilization: brace Supplies used: elastic bandage Post-procedure neurovascular assessment: post-procedure neurovascularly intact Patient tolerance: Patient tolerated the procedure well with no immediate complications     DIAGNOSTIC STUDIES:  Oxygen Saturation is 98% on RA, normal by my interpretation.    COORDINATION OF CARE:  8:49 PM  Discussed treatment plan with pt at bedside and pt agreed to plan. Ace wrap applied to left wrist. Pt NVI.   Imaging Review Dg Wrist Complete Left  09/13/2015  CLINICAL DATA:  74 year old female with history of trauma from a fall complaining of left wrist pain. EXAM: LEFT WRIST - COMPLETE 3+ VIEW COMPARISON:  No priors. FINDINGS: Four views of the left wrist demonstrate no acute displaced fracture or dislocation. Severe degenerative changes are noted, most evident at the first carpal/metacarpal joint where there is profound joint space narrowing, subchondral sclerosis, subchondral cyst formation, osteophyte formation and lateral subluxation, compatible with advanced osteoarthritis. Calcifications in the triangular fibrocartilage complex. IMPRESSION: 1. Soft tissue swelling around the left wrist without acute displaced fracture. 2. Advanced degenerative changes in the left wrist, as above, compatible with severe osteoarthritis. 3. Chondrocalcinosis. Electronically Signed   By: Vinnie Langton M.D.   On: 09/13/2015 20:29   I have personally reviewed and evaluated these images as part of my medical decision-making.   MDM   Final diagnoses:  Left wrist pain   74 year old female with a mechanical fall prior to arrival. No head injury or loss of consciousness. Left wrist with hematoma noted to radial aspect. No acute bony deformity. Hand is neurovascularly intact. X-rays negative for acute bony findings. Ace wrap was applied for comfort. Discussed supportive care home. Patient is to follow-up with her PCP.  Discussed plan with patient, he/she acknowledged understanding and agreed with plan of care.  Return precautions given for new or worsening symptoms.  I personally performed the services described in this documentation, which was scribed in my presence. The recorded information has been reviewed and is accurate.   Larene Pickett, PA-C 09/13/15 2101  Orlie Dakin, MD 09/14/15 [phone]

## 2015-09-13 NOTE — ED Notes (Signed)
PA at bedside applying ace bandage

## 2015-09-13 NOTE — ED Notes (Signed)
PA at bedside with RN doing assessment.  Please see PA assessment note

## 2015-09-13 NOTE — ED Notes (Signed)
Pt able to ambulate independently 

## 2015-09-13 NOTE — ED Notes (Signed)
Pt. tripped and fell at home this evening , no LOC /ambulatory , reports pain with mild swelling at left wrist.

## 2015-10-12 ENCOUNTER — Emergency Department (HOSPITAL_COMMUNITY): Payer: Medicare HMO

## 2015-10-12 ENCOUNTER — Encounter (HOSPITAL_COMMUNITY): Payer: Self-pay | Admitting: Emergency Medicine

## 2015-10-12 ENCOUNTER — Emergency Department (HOSPITAL_COMMUNITY)
Admission: EM | Admit: 2015-10-12 | Discharge: 2015-10-12 | Disposition: A | Discharge status: Home / Self Care | Payer: Medicare HMO | Attending: Emergency Medicine | Admitting: Emergency Medicine

## 2015-10-12 DIAGNOSIS — Z79899 Other long term (current) drug therapy: Secondary | ICD-10-CM | POA: Insufficient documentation

## 2015-10-12 DIAGNOSIS — R200 Anesthesia of skin: Secondary | ICD-10-CM | POA: Diagnosis present

## 2015-10-12 DIAGNOSIS — I10 Essential (primary) hypertension: Secondary | ICD-10-CM | POA: Diagnosis not present

## 2015-10-12 DIAGNOSIS — J45909 Unspecified asthma, uncomplicated: Secondary | ICD-10-CM | POA: Diagnosis not present

## 2015-10-12 DIAGNOSIS — M1990 Unspecified osteoarthritis, unspecified site: Secondary | ICD-10-CM | POA: Diagnosis not present

## 2015-10-12 DIAGNOSIS — Z7982 Long term (current) use of aspirin: Secondary | ICD-10-CM | POA: Insufficient documentation

## 2015-10-12 DIAGNOSIS — Z7951 Long term (current) use of inhaled steroids: Secondary | ICD-10-CM | POA: Diagnosis not present

## 2015-10-12 DIAGNOSIS — G459 Transient cerebral ischemic attack, unspecified: Secondary | ICD-10-CM | POA: Insufficient documentation

## 2015-10-12 DIAGNOSIS — Z791 Long term (current) use of non-steroidal anti-inflammatories (NSAID): Secondary | ICD-10-CM | POA: Insufficient documentation

## 2015-10-12 LAB — I-STAT CHEM 8, ED
BUN: 16 mg/dL (ref 6–20)
CHLORIDE: 103 mmol/L (ref 101–111)
Calcium, Ion: 1.15 mmol/L (ref 1.13–1.30)
Creatinine, Ser: 0.9 mg/dL (ref 0.44–1.00)
Glucose, Bld: 113 mg/dL — ABNORMAL HIGH (ref 65–99)
HEMATOCRIT: 42 % (ref 36.0–46.0)
Hemoglobin: 14.3 g/dL (ref 12.0–15.0)
Potassium: 3.8 mmol/L (ref 3.5–5.1)
Sodium: 139 mmol/L (ref 135–145)
TCO2: 25 mmol/L (ref 0–100)

## 2015-10-12 LAB — DIFFERENTIAL
Basophils Absolute: 0 10*3/uL (ref 0.0–0.1)
Basophils Relative: 0 %
EOS PCT: 2 %
Eosinophils Absolute: 0.2 10*3/uL (ref 0.0–0.7)
Lymphocytes Relative: 22 %
Lymphs Abs: 1.8 10*3/uL (ref 0.7–4.0)
MONO ABS: 0.5 10*3/uL (ref 0.1–1.0)
Monocytes Relative: 7 %
Neutro Abs: 5.6 10*3/uL (ref 1.7–7.7)
Neutrophils Relative %: 69 %

## 2015-10-12 LAB — CBC
HCT: 38.1 % (ref 36.0–46.0)
Hemoglobin: 12.6 g/dL (ref 12.0–15.0)
MCH: 28.5 pg (ref 26.0–34.0)
MCHC: 33.1 g/dL (ref 30.0–36.0)
MCV: 86.2 fL (ref 78.0–100.0)
PLATELETS: 296 10*3/uL (ref 150–400)
RBC: 4.42 MIL/uL (ref 3.87–5.11)
RDW: 14.6 % (ref 11.5–15.5)
WBC: 8.1 10*3/uL (ref 4.0–10.5)

## 2015-10-12 LAB — COMPREHENSIVE METABOLIC PANEL
ALT: 12 U/L — ABNORMAL LOW (ref 14–54)
ANION GAP: 12 (ref 5–15)
AST: 18 U/L (ref 15–41)
Albumin: 3.9 g/dL (ref 3.5–5.0)
Alkaline Phosphatase: 61 U/L (ref 38–126)
BUN: 13 mg/dL (ref 6–20)
CALCIUM: 9.4 mg/dL (ref 8.9–10.3)
CO2: 22 mmol/L (ref 22–32)
Chloride: 105 mmol/L (ref 101–111)
Creatinine, Ser: 1 mg/dL (ref 0.44–1.00)
GFR calc non Af Amer: 54 mL/min — ABNORMAL LOW (ref >60)
GLUCOSE: 116 mg/dL — AB (ref 65–99)
POTASSIUM: 3.9 mmol/L (ref 3.5–5.1)
Sodium: 139 mmol/L (ref 135–145)
TOTAL PROTEIN: 6.8 g/dL (ref 6.5–8.1)
Total Bilirubin: 0.5 mg/dL (ref 0.3–1.2)

## 2015-10-12 LAB — APTT: aPTT: 29 seconds (ref 24–37)

## 2015-10-12 LAB — PROTIME-INR
INR: 1.03 (ref 0.00–1.49)
Prothrombin Time: 13.7 seconds (ref 11.6–15.2)

## 2015-10-12 LAB — I-STAT TROPONIN, ED: Troponin i, poc: 0 ng/mL (ref 0.00–0.08)

## 2015-10-12 NOTE — ED Notes (Signed)
Pt from home for eval of HTN and facial numbness to right side of face that started at 1200 today and lasted 10 minutes. Pt denies any numbness at this time, no neuro deficits noted. Pt noted to have wrist device to left hand. Pt denies any n/v/d. States home BP was [id number] systolic. Nad ntoed.

## 2015-10-12 NOTE — ED Notes (Signed)
Pt remains monitored by blood pressure, pulse ox, and 5 lead. pts family remains at bedside. Dr Kathrynn Humble at bedside.

## 2015-10-12 NOTE — Discharge Instructions (Signed)
We think you had a TIA. Please see your primary care doctor or Neurology for further workup. Keep taking aspirin. Return to the ER immediately if you get one sided numbness, tingling, weakness or confusion, seizures, poor balance or poor vision.   Transient Ischemic Attack A transient ischemic attack (TIA) is a "warning stroke" that causes stroke-like symptoms. Unlike a stroke, a TIA does not cause permanent damage to the brain. The symptoms of a TIA can happen very fast and do not last long. It is important to know the symptoms of a TIA and what to do. This can help prevent a major stroke or death. CAUSES  A TIA is caused by a temporary blockage in an artery in the brain or neck (carotid artery). The blockage does not allow the brain to get the blood supply it needs and can cause different symptoms. The blockage can be caused by either:  A blood clot.  Fatty buildup (plaque) in a neck or brain artery. RISK FACTORS  High blood pressure (hypertension).  High cholesterol.  Diabetes mellitus.  Heart disease.  The buildup of plaque in the blood vessels (peripheral artery disease or atherosclerosis).  The buildup of plaque in the blood vessels that provide blood and oxygen to the brain (carotid artery stenosis).  An abnormal heart rhythm (atrial fibrillation).  Obesity.  Using any tobacco products, including cigarettes, chewing tobacco, or electronic cigarettes.  Taking oral contraceptives, especially in combination with using tobacco.  Physical inactivity.  A diet high in fats, salt (sodium), and calories.  Excessive alcohol use.  Use of illegal drugs (especially cocaine and methamphetamine).  Being female.  Being African American.  Being over the age of 54 years.  Family history of stroke.  Previous history of blood clots, stroke, TIA, or heart attack.  Sickle cell disease. SIGNS AND SYMPTOMS  TIA symptoms are the same as a stroke but are temporary. These symptoms  usually develop suddenly, or may be newly present upon waking from sleep:  Sudden weakness or numbness of the face, arm, or leg, especially on one side of the body.  Sudden trouble walking or difficulty moving arms or legs.  Sudden confusion.  Sudden personality changes.  Trouble speaking (aphasia) or understanding.  Difficulty swallowing.  Sudden trouble seeing in one or both eyes.  Double vision.  Dizziness.  Loss of balance or coordination.  Sudden severe headache with no known cause.  Trouble reading or writing.  Loss of bowel or bladder control.  Loss of consciousness. DIAGNOSIS  Your health care provider may be able to determine the presence or absence of a TIA based on your symptoms, history, and physical exam. CT scan of the brain is usually performed to help identify a TIA. Other tests may include:  Electrocardiography (ECG).  Continuous heart monitoring.  Echocardiography.  Carotid ultrasonography.  MRI.  A scan of the brain circulation.  Blood tests. TREATMENT  Since the symptoms of TIA are the same as a stroke, it is important to seek treatment as soon as possible. You may need a medicine to dissolve a blood clot (thrombolytic) if that is the cause of the TIA. This medicine cannot be given if too much time has passed. Treatment may also include:   Rest, oxygen, fluids through an IV tube, and medicines to thin the blood (anticoagulants).  Measures will be taken to prevent short-term and long-term complications, including infection from breathing foreign material into the lungs (aspiration pneumonia), blood clots in the legs, and falls.  Procedures  to either remove plaque in the carotid arteries or dilate carotid arteries that have narrowed due to plaque. Those procedures are:  Carotid endarterectomy.  Carotid angioplasty and stenting.  Medicines and diet may be used to address diabetes, high blood pressure, and other underlying risk factors. HOME  CARE INSTRUCTIONS   Take medicines only as directed by your health care provider. Follow the directions carefully. Medicines may be used to control risk factors for a stroke. Be sure you understand all your medicine instructions.  You may be told to take aspirin or the anticoagulant warfarin. Warfarin needs to be taken exactly as instructed.  Taking too much or too little warfarin is dangerous. Too much warfarin increases the risk of bleeding. Too little warfarin continues to allow the risk for blood clots. While taking warfarin, you will need to have regular blood tests to measure your blood clotting time. A PT blood test measures how long it takes for blood to clot. Your PT is used to calculate another value called an INR. Your PT and INR help your health care provider to adjust your dose of warfarin. The dose can change for many reasons. It is critically important that you take warfarin exactly as prescribed.  Many foods, especially foods high in vitamin K can interfere with warfarin and affect the PT and INR. Foods high in vitamin K include spinach, kale, broccoli, cabbage, collard and turnip greens, Brussels sprouts, peas, cauliflower, seaweed, and parsley, as well as beef and pork liver, green tea, and soybean oil. You should eat a consistent amount of foods high in vitamin K. Avoid major changes in your diet, or notify your health care provider before changing your diet. Arrange a visit with a dietitian to answer your questions.  Many medicines can interfere with warfarin and affect the PT and INR. You must tell your health care provider about any and all medicines you take; this includes all vitamins and supplements. Be especially cautious with aspirin and anti-inflammatory medicines. Do not take or discontinue any prescribed or over-the-counter medicine except on the advice of your health care provider or pharmacist.  Warfarin can have side effects, such as excessive bruising or bleeding. You  will need to hold pressure over cuts for longer than usual. Your health care provider or pharmacist will discuss other potential side effects.  Avoid sports or activities that may cause injury or bleeding.  Be careful when shaving, flossing your teeth, or handling sharp objects.  Alcohol can change the body's ability to handle warfarin. It is best to avoid alcoholic drinks or consume only very small amounts while taking warfarin. Notify your health care provider if you change your alcohol intake.  Notify your dentist or other health care providers before procedures.  Eat a diet that includes 5 or more servings of fruits and vegetables each day. This may reduce the risk of stroke. Certain diets may be prescribed to address high blood pressure, high cholesterol, diabetes, or obesity.  A diet low in sodium, saturated fat, trans fat, and cholesterol is recommended to manage high blood pressure.  A diet low in saturated fat, trans fat, and cholesterol, and high in fiber may control cholesterol levels.  A controlled-carbohydrate, controlled-sugar diet is recommended to manage diabetes.  A reduced-calorie diet that is low in sodium, saturated fat, trans fat, and cholesterol is recommended to manage obesity.  Maintain a healthy weight.  Stay physically active. It is recommended that you get at least 30 minutes of activity on most or all  days.  Do not use any tobacco products, including cigarettes, chewing tobacco, or electronic cigarettes. If you need help quitting, ask your health care provider.  Limit alcohol intake to no more than 1 drink per day for nonpregnant women and 2 drinks per day for men. One drink equals 12 ounces of beer, 5 ounces of wine, or 1 ounces of hard liquor.  Do not abuse drugs.  A safe home environment is important to reduce the risk of falls. Your health care provider may arrange for specialists to evaluate your home. Having grab bars in the bedroom and bathroom is  often important. Your health care provider may arrange for equipment to be used at home, such as raised toilets and a seat for the shower.  Follow all instructions for follow-up with your health care provider. This is very important. This includes any referrals and lab tests. Proper follow-up can prevent a stroke or another TIA from occurring. PREVENTION  The risk of a TIA can be decreased by appropriately treating high blood pressure, high cholesterol, diabetes, heart disease, and obesity, and by quitting smoking, limiting alcohol, and staying physically active. SEEK MEDICAL CARE IF:  You have personality changes.  You have difficulty swallowing.  You are seeing double.  You have dizziness.  You have a fever. SEEK IMMEDIATE MEDICAL CARE IF:  Any of the following symptoms may represent a serious problem that is an emergency. Do not wait to see if the symptoms will go away. Get medical help right away. Call your local emergency services (911 in [place] Do not drive yourself to the hospital.  You have sudden weakness or numbness of the face, arm, or leg, especially on one side of the body.  You have sudden trouble walking or difficulty moving arms or legs.  You have sudden confusion.  You have trouble speaking (aphasia) or understanding.  You have sudden trouble seeing in one or both eyes.  You have a loss of balance or coordination.  You have a sudden, severe headache with no known cause.  You have new chest pain or an irregular heartbeat.  You have a partial or total loss of consciousness. MAKE SURE YOU:   Understand these instructions.  Will watch your condition.  Will get help right away if you are not doing well or get worse.   This information is not intended to replace advice given to you by your health care provider. Make sure you discuss any questions you have with your health care provider.   Document Released: 07/09/2005 Document Revised: 10/20/2014 Document  Reviewed: 01/04/2014 Elsevier Interactive Patient Education [employer].

## 2015-10-12 NOTE — ED Notes (Signed)
Pt placed into gown and on to monitor upon arrival to room. Pt monitored by blood pressure, pulse ox, and 5 lead. Pt has visitor at bedside.

## 2015-10-12 NOTE — ED Provider Notes (Signed)
CSN: [id number]     Arrival date & time 10/12/15  1326 History   First MD Initiated Contact with Patient 10/12/15 1553     Chief Complaint  Patient presents with  . Numbness  . Hypertension     (Consider location/radiation/quality/duration/timing/severity/associated sxs/prior Treatment) HPI Comments: Pt comes in with cc of R sided numbness and tingling. Around noon today she started noticing numbness to the right side of the lower face and lips and finger 4 and 5. The symptoms lasted for 10 minutes and resolved. Pt also felt unsteady with her gait at that time, no dizziness however. No chest pain, headache, dib, palpitations. Pt has HTN, 2nd hand smoke exposure. She has no hx of strokes, CAD. She had an episode about Month ago of Rt lip numbness for 10-15 minutes.       Patient is a 74 y.o. female presenting with hypertension. The history is provided by the patient and medical records.  Hypertension Pertinent negatives include no chest pain, no abdominal pain, no headaches and no shortness of breath.    Past Medical History  Diagnosis Date  . Asthma   . Hypertension   . Arthritis    Past Surgical History  Procedure Laterality Date  . Abdominal surgery    . Cholecystectomy    . Cesarean section     No family history on file. Social History  Substance Use Topics  . Smoking status: Passive Smoke Exposure - Never Smoker    Types: Cigarettes  . Smokeless tobacco: None  . Alcohol Use: Yes   OB History    No data available     Review of Systems  Constitutional: Positive for activity change.  Respiratory: Negative for shortness of breath.   Cardiovascular: Negative for chest pain.  Gastrointestinal: Negative for nausea, vomiting and abdominal pain.  Musculoskeletal: Negative for neck pain.  Skin: Negative for rash and wound.  Neurological: Positive for numbness. Negative for dizziness, facial asymmetry, weakness and headaches.  All other systems reviewed and are  negative.     Allergies  Codeine  Home Medications   Prior to Admission medications   Medication Sig Start Date End Date Taking? Authorizing Provider  acetaminophen (TYLENOL) 500 MG tablet Take 1,000 mg by mouth daily as needed for mild pain or headache.   Yes Historical Provider, MD  ADVAIR DISKUS 250-50 MCG/DOSE AEPB Inhale 1 puff into the lungs 2 (two) times daily.  08/22/15  Yes Historical Provider, MD  ALPRAZolam Duanne Moron) 0.25 MG tablet Take 0.125-0.25 mg by mouth daily as needed for anxiety or sleep.  08/30/15  Yes Historical Provider, MD  amLODipine (NORVASC) 5 MG tablet Take 5 mg by mouth daily. 07/21/15  Yes Historical Provider, MD  aspirin EC 81 MG tablet Take 81 mg by mouth daily.   Yes Historical Provider, MD  atenolol (TENORMIN) 50 MG tablet Take 50 mg by mouth 2 (two) times daily. 09/10/15  Yes Historical Provider, MD  celecoxib (CELEBREX) 200 MG capsule Take 200 mg by mouth daily. 09/10/15  Yes Historical Provider, MD  Dextromethorphan-Guaifenesin (ROBITUSSIN COUGH/CHEST DM MAX PO) Take 1 tablet by mouth 2 (two) times daily as needed (for cold symptoms).   Yes Historical Provider, MD  fexofenadine (ALLEGRA) 180 MG tablet Take 180 mg by mouth daily.   Yes Historical Provider, MD  fluticasone Asencion Islam) 50 MCG/ACT nasal spray  09/26/15  Yes Historical Provider, MD  losartan (COZAAR) 100 MG tablet Take 100 mg by mouth daily. 07/21/15  Yes Historical Provider, MD  Melatonin 3 MG TABS Take 3 mg by mouth at bedtime as needed (for sleep).   Yes Historical Provider, MD  montelukast (SINGULAIR) 10 MG tablet Take 10 mg by mouth at bedtime. 09/10/15  Yes Historical Provider, MD  Multiple Vitamins-Minerals (CENTRUM SILVER ADULT 50+ PO) Take 1 tablet by mouth daily.   Yes Historical Provider, MD  omeprazole (PRILOSEC) 40 MG capsule Take 40 mg by mouth 2 (two) times daily. 09/10/15  Yes Historical Provider, MD  PROAIR HFA 108 (90 Base) MCG/ACT inhaler Inhale 1-2 puffs into the lungs every 6  (six) hours as needed for wheezing or shortness of breath.  10/05/15  Yes Historical Provider, MD  azelastine (ASTELIN) 0.1 % nasal spray Place 1 spray into both nostrils daily as needed for rhinitis or allergies.  09/26/15   Historical Provider, MD  traMADol (ULTRAM) 50 MG tablet Take 50 mg by mouth daily as needed for moderate pain.  10/03/15   Historical Provider, MD   BP 151/79 mmHg  Pulse 70  Temp(Src) 97.8 F (36.6 C) (Oral)  Resp 12  Ht 4\' 11"  (1.499 m)  Wt 195 lb (88.451 kg)  BMI 39.36 kg/m2  SpO2 98% Physical Exam  Constitutional: She is oriented to person, place, and time. She appears well-developed.  HENT:  Head: Normocephalic and atraumatic.  Eyes: EOM are normal.  Neck: Normal range of motion. Neck supple.  Cardiovascular: Normal rate.   Pulmonary/Chest: Effort normal.  Abdominal: Bowel sounds are normal.  Neurological: She is alert and oriented to person, place, and time. No cranial nerve deficit. Coordination normal.  Skin: Skin is warm and dry.  Nursing note and vitals reviewed.   ED Course  Procedures (including critical care time) Labs Review Labs Reviewed  COMPREHENSIVE METABOLIC PANEL - Abnormal; Notable for the following:    Glucose, Bld 116 (*)    ALT 12 (*)    GFR calc non Af Amer 54 (*)    All other components within normal limits  I-STAT CHEM 8, ED - Abnormal; Notable for the following:    Glucose, Bld 113 (*)    All other components within normal limits  PROTIME-INR  APTT  CBC  DIFFERENTIAL  I-STAT TROPOININ, ED  CBG MONITORING, ED    Imaging Review Ct Head Wo Contrast  10/12/2015  CLINICAL DATA:  Hypertensive this morning come numbness to lips, tongue and right side of face lasting 15-20 minutes beginning at noon. EXAM: CT HEAD WITHOUT CONTRAST TECHNIQUE: Contiguous axial images were obtained from the base of the skull through the vertex without intravenous contrast. COMPARISON:  Head CT dated 04/05/2013. FINDINGS: There is generalized  brain atrophy, frontotemporal lobe predominant, with commensurate dilatation of the ventricles and sulci. This is not significantly changed compared to the previous study of 04/05/2013. There is no mass, hemorrhage, edema or other evidence of acute parenchymal abnormality. No extra-axial hemorrhage. There are chronic calcified atherosclerotic changes of the large vessels at the skull base. No osseous abnormality. Visualized upper paranasal sinuses are clear. Mastoid air cells are clear. Superficial soft tissues are unremarkable. IMPRESSION: 1. No evidence of acute intracranial abnormality. No intracranial mass, hemorrhage or edema. 2. Generalized atrophy, frontotemporal lobe predominant. Electronically Signed   By: Franki Cabot M.D.   On: 10/12/2015 14:27   I have personally reviewed and evaluated these images and lab results as part of my medical decision-making.   EKG Interpretation   Date/Time:  Friday October 12 2015 13:33:01 EST Ventricular Rate:  80 PR Interval:  212  QRS Duration: 120 QT Interval:  392 QTC Calculation: 452 R Axis:   41 Text Interpretation:  Sinus rhythm with 1st degree A-V block with  occasional Premature ventricular complexes Right bundle branch block  Abnormal ECG Nonspecific ST and T wave abnormality No old tracing to  compare Confirmed by Sarabelle Genson, MD, Thelma Comp [id number]) on 10/12/2015 4:17:15 PM      MDM   Final diagnoses:  Transient cerebral ischemia, unspecified transient cerebral ischemia type   Pt comes in with cc of numbness to the R side - lower face and fingers. Symptoms lasted for 10 min.  ABCD2 score is 2 (3 if we state that the symptoms lasted for > 10 min).  Low risk. MRI offered - but pt has severe claustrophobia. Offered Ativan or Valium - but she has used them with extreme stress in the past and prefers the open MRIs.  Outpatient f.u info given. Strict return precautions discussed.    Varney Biles, MD 10/12/15 [phone]

## 2015-11-13 ENCOUNTER — Ambulatory Visit (INDEPENDENT_AMBULATORY_CARE_PROVIDER_SITE_OTHER): Payer: Medicare HMO | Admitting: Neurology

## 2015-11-13 ENCOUNTER — Encounter: Payer: Self-pay | Admitting: Neurology

## 2015-11-13 VITALS — BP 156/94 | HR 90 | Ht 59.0 in | Wt 201.0 lb

## 2015-11-13 DIAGNOSIS — G43109 Migraine with aura, not intractable, without status migrainosus: Secondary | ICD-10-CM

## 2015-11-13 DIAGNOSIS — R739 Hyperglycemia, unspecified: Secondary | ICD-10-CM

## 2015-11-13 DIAGNOSIS — G459 Transient cerebral ischemic attack, unspecified: Secondary | ICD-10-CM | POA: Diagnosis not present

## 2015-11-13 NOTE — Progress Notes (Signed)
NEUROLOGY CONSULTATION NOTE  Alisha Terrell MRN: [id number] DOB: 1940-11-22  Referring provider: Dr. Tollie Pizza Primary care provider: Dr. Tollie Pizza  Reason for consult:  TIA  HISTORY OF PRESENT ILLNESS: Alisha Terrell is a 75 year old right-handed female with hypertension who presents for TIA.  History obtained by patient and ED note.  Labs and imaging of head CT reviewed.  On 10/12/15, she developed numbness on the right lower side of her face and lips, as well as the 4th and 5th digits of her right hand.  It lasted 15 minutes and resolved.  Systolic blood pressure was in the low 200s (typically it is in the 140s).  At the time, she felt unsteady but denied dizziness, visual disturbance or focal weakness.  She was evaluated at the ED.  EKG showed sinus rhythm.  Troponins, CBC and BMP were unremarkable.  Glucose was 113.  CT of head showed no acute intracranial process.  She declined MRI due to claustrophobia.  She has been on ASA 81mg  daily for several years and has doubled the dose since this event.  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  In November, she had increase in these auras.  However, on one occasion, she developed perioral numbness and tingling for short time.  It was preceded earlier in the day by a headache.    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  PAST MEDICAL HISTORY: Past Medical History  Diagnosis Date  . Asthma   . Hypertension   . Arthritis     PAST SURGICAL HISTORY: Past Surgical History  Procedure Laterality Date  . Abdominal surgery    . Cholecystectomy    . Cesarean section      MEDICATIONS: Current Outpatient Prescriptions on File Prior to Visit  Medication Sig Dispense Refill  . acetaminophen (TYLENOL) 500 MG tablet Take 1,000 mg by mouth daily as needed for mild  pain or headache.    . ADVAIR DISKUS 250-50 MCG/DOSE AEPB Inhale 1 puff into the lungs 2 (two) times daily.     Marland Kitchen ALPRAZolam (XANAX) 0.25 MG tablet Take 0.125-0.25 mg by mouth daily as needed for anxiety or sleep.     Marland Kitchen amLODipine (NORVASC) 5 MG tablet Take 5 mg by mouth daily.    Marland Kitchen aspirin EC 81 MG tablet Take 81 mg by mouth daily.    Marland Kitchen atenolol (TENORMIN) 50 MG tablet Take 50 mg by mouth 2 (two) times daily.    Marland Kitchen azelastine (ASTELIN) 0.1 % nasal spray Place 1 spray into both nostrils daily as needed for rhinitis or allergies.     . celecoxib (CELEBREX) 200 MG capsule Take 200 mg by mouth daily.    Marland Kitchen Dextromethorphan-Guaifenesin (ROBITUSSIN COUGH/CHEST DM MAX PO) Take 1 mL by mouth 2 (two) times daily as needed (for cold symptoms).     . fexofenadine (ALLEGRA) 180 MG tablet Take 180 mg by mouth daily.    . fluticasone (FLONASE) 50 MCG/ACT nasal spray     . losartan (COZAAR) 100 MG tablet Take 100 mg by mouth daily.    . Melatonin 3 MG TABS Take 3 mg by mouth at bedtime as needed (for sleep).    . montelukast (SINGULAIR) 10 MG tablet Take 10 mg by mouth at bedtime.    . Multiple Vitamins-Minerals (CENTRUM SILVER ADULT 50+ PO) Take  1 tablet by mouth daily.    Marland Kitchen omeprazole (PRILOSEC) 40 MG capsule Take 40 mg by mouth 2 (two) times daily.    Marland Kitchen PROAIR HFA 108 (90 Base) MCG/ACT inhaler Inhale 1-2 puffs into the lungs every 6 (six) hours as needed for wheezing or shortness of breath.     . traMADol (ULTRAM) 50 MG tablet Take 50 mg by mouth daily as needed for moderate pain.      No current facility-administered medications on file prior to visit.    ALLERGIES: Allergies  Allergen Reactions  . Codeine Other (See Comments)    Agitation, bad dreams  . Omnicef [Cefdinir] Nausea And Vomiting    FAMILY HISTORY: History reviewed. No pertinent family history.  SOCIAL HISTORY: Social History   Social History  . Marital Status: Married    Spouse Name: N/A  . Number of Children: N/A  . Years  of Education: N/A   Occupational History  . Not on file.   Social History Main Topics  . Smoking status: Passive Smoke Exposure - Never Smoker    Types: Cigarettes  . Smokeless tobacco: Not on file  . Alcohol Use: Yes  . Drug Use: No  . Sexual Activity: Not on file   Other Topics Concern  . Not on file   Social History Narrative    REVIEW OF SYSTEMS: Constitutional: No fevers, chills, or sweats, no generalized fatigue, change in appetite Eyes: No visual changes, double vision, eye pain Ear, nose and throat: No hearing loss, ear pain, nasal congestion, sore throat Cardiovascular: No chest pain, palpitations Respiratory:  No shortness of breath at rest or with exertion, wheezes GastrointestinaI: No nausea, vomiting, diarrhea, abdominal pain, fecal incontinence Genitourinary:  No dysuria, urinary retention or frequency Musculoskeletal:  No neck pain, back pain Integumentary: No rash, pruritus, skin lesions Neurological: as above Psychiatric: No depression, insomnia, anxiety Endocrine: No palpitations, fatigue, diaphoresis, mood swings, change in appetite, change in weight, increased thirst Hematologic/Lymphatic:  No anemia, purpura, petechiae. Allergic/Immunologic: no itchy/runny eyes, nasal congestion, recent allergic reactions, rashes  PHYSICAL EXAM: Filed Vitals:   11/13/15 1011  BP: 156/94  Pulse: 90   General: No acute distress.  Patient appears well-groomed.  Head:  Normocephalic/atraumatic Eyes:  fundi unremarkable, without vessel changes, exudates, hemorrhages or papilledema. Neck: supple, no paraspinal tenderness, full range of motion Back: No paraspinal tenderness Heart: regular rate and rhythm Lungs: Clear to auscultation bilaterally. Vascular: No carotid bruits. Neurological Exam: Mental status: alert and oriented to person, place, and time, recent and remote memory intact, fund of knowledge intact, attention and concentration intact, speech fluent and not  dysarthric, language intact. Cranial nerves: CN I: not tested CN II: pupils equal, round and reactive to light, visual fields intact, fundi unremarkable, without vessel changes, exudates, hemorrhages or papilledema. CN III, IV, VI:  full range of motion, no nystagmus, no ptosis CN V: facial sensation intact CN VII: upper and lower face symmetric CN VIII: hearing intact CN IX, X: gag intact, uvula midline CN XI: sternocleidomastoid and trapezius muscles intact CN XII: tongue midline Bulk & Tone: normal, no fasciculations. Motor:  5/5 throughout  Sensation: temperature and vibration sensation intact. Deep Tendon Reflexes:  2+ throughout, toes downgoing.  Finger to nose testing:  Without dysmetria.  Heel to shin:  Without dysmetria.  Gait:  Mild limp.  Able to turn and tandem walk. Romberg with sway.  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 Morbid obesity (BMI  greater than 40)  PLAN: 1.  We will continue ASA 81mg  daily.  I would not switch to Plavix as she is on omeprazole, which decreases efficacy of Plavix anyway.  Regardless, switching from ASA 81mg  to 325mg  or Plavix is a lateral change and statistically, has not been shown to be inferior to these agents. 2.  Will check fasting lipid panel.  Will need to be on some statin.  Will also check Hgb A1c 3.  2D echo and carotid doppler 4.  Optimize blood pressure control.  Recheck with PCP. 5.  Weight loss 6.  Follow up in 6 months.  Thank you for allowing me to take part in the care of this patient.  Metta Clines, DO  CC:  Juanita Craver, MD

## 2015-11-13 NOTE — Patient Instructions (Addendum)
1.  I would continue aspirin 81mg  daily 2.  We will check fasting lipid panel and Hgb A1c 3.  We will check 2D echo and carotid doppler  ECHO: [facility]. [address] [place]  [place], [place] [address]  [id number]  11/19/15 @ 3:45    Carotid Doppler: [facility]   [address], [place] [address]  [id number]  11/19/15 @ 12:40    4.  Optimize blood pressure control 5.  Follow up in 6 months.

## 2015-11-13 NOTE — Progress Notes (Signed)
Chart forwarded.  

## 2015-11-14 ENCOUNTER — Other Ambulatory Visit (INDEPENDENT_AMBULATORY_CARE_PROVIDER_SITE_OTHER): Payer: Medicare HMO

## 2015-11-14 DIAGNOSIS — G459 Transient cerebral ischemic attack, unspecified: Secondary | ICD-10-CM

## 2015-11-14 DIAGNOSIS — I10 Essential (primary) hypertension: Secondary | ICD-10-CM | POA: Insufficient documentation

## 2015-11-14 DIAGNOSIS — M129 Arthropathy, unspecified: Secondary | ICD-10-CM | POA: Insufficient documentation

## 2015-11-14 DIAGNOSIS — Z780 Asymptomatic menopausal state: Secondary | ICD-10-CM | POA: Insufficient documentation

## 2015-11-14 DIAGNOSIS — E669 Obesity, unspecified: Secondary | ICD-10-CM | POA: Insufficient documentation

## 2015-11-14 DIAGNOSIS — M545 Low back pain, unspecified: Secondary | ICD-10-CM | POA: Insufficient documentation

## 2015-11-14 DIAGNOSIS — J309 Allergic rhinitis, unspecified: Secondary | ICD-10-CM | POA: Insufficient documentation

## 2015-11-14 DIAGNOSIS — G4733 Obstructive sleep apnea (adult) (pediatric): Secondary | ICD-10-CM | POA: Insufficient documentation

## 2015-11-14 DIAGNOSIS — K219 Gastro-esophageal reflux disease without esophagitis: Secondary | ICD-10-CM | POA: Insufficient documentation

## 2015-11-14 DIAGNOSIS — R739 Hyperglycemia, unspecified: Secondary | ICD-10-CM | POA: Diagnosis not present

## 2015-11-14 DIAGNOSIS — M542 Cervicalgia: Secondary | ICD-10-CM | POA: Insufficient documentation

## 2015-11-14 DIAGNOSIS — F5109 Other insomnia not due to a substance or known physiological condition: Secondary | ICD-10-CM | POA: Insufficient documentation

## 2015-11-14 LAB — LIPID PANEL
Cholesterol: 134 mg/dL (ref 0–200)
HDL: 78.7 mg/dL (ref >39.00)
LDL Cholesterol: 51 mg/dL (ref 0–99)
NONHDL: 55.68
Total CHOL/HDL Ratio: 2
Triglycerides: 24 mg/dL (ref 0.0–149.0)
VLDL: 4.8 mg/dL (ref 0.0–40.0)

## 2015-11-14 LAB — HEMOGLOBIN A1C: Hgb A1c MFr Bld: 6 % (ref 4.6–6.5)

## 2015-11-16 ENCOUNTER — Telehealth: Payer: Self-pay

## 2015-11-16 NOTE — Telephone Encounter (Signed)
Message relayed to patient. Verbalized understanding and denied questions.   

## 2015-11-16 NOTE — Telephone Encounter (Signed)
-----   Message from Pieter Partridge, DO sent at 11/16/2015 10:04 AM EST ----- Her cholesterol panel looks fantastic.  I would not start any cholesterol medication

## 2015-11-19 ENCOUNTER — Ambulatory Visit
Admission: RE | Admit: 2015-11-19 | Discharge: 2015-11-19 | Disposition: A | Discharge status: Home / Self Care | Payer: Medicare HMO | Source: Ambulatory Visit | Attending: Neurology | Admitting: Neurology

## 2015-11-19 ENCOUNTER — Other Ambulatory Visit: Payer: Self-pay

## 2015-11-19 ENCOUNTER — Ambulatory Visit (HOSPITAL_COMMUNITY): Payer: Medicare HMO | Attending: Cardiovascular Disease

## 2015-11-19 DIAGNOSIS — I10 Essential (primary) hypertension: Secondary | ICD-10-CM | POA: Diagnosis not present

## 2015-11-19 DIAGNOSIS — G459 Transient cerebral ischemic attack, unspecified: Secondary | ICD-10-CM | POA: Insufficient documentation

## 2015-11-19 DIAGNOSIS — I517 Cardiomegaly: Secondary | ICD-10-CM | POA: Insufficient documentation

## 2015-11-20 ENCOUNTER — Telehealth: Payer: Self-pay

## 2015-11-20 NOTE — Telephone Encounter (Signed)
Message relayed to patient. Verbalized understanding and denied questions.   

## 2015-11-20 NOTE — Telephone Encounter (Signed)
-----   Message from Pieter Partridge, DO sent at 11/20/2015  7:00 AM EST ----- Carotid doppler does not reveal any significant narrowing of the carotid arteries, which is good.  The left carotid shows minimal plaque but nothing significant.

## 2015-11-20 NOTE — Telephone Encounter (Signed)
That would be okay

## 2015-11-20 NOTE — Telephone Encounter (Signed)
-----   Message from Pieter Partridge, DO sent at 11/20/2015  7:01 AM EST ----- 2D echo looks okay

## 2015-11-20 NOTE — Telephone Encounter (Signed)
Message relayed to patient. Verbalized understanding. While on the phone pt did ask about having some PT and a crown. Pt stated that her physical therapist and dentist wanted her to make sure that you were okay with her having those respective procedures done. Please advise.

## 2015-12-14 ENCOUNTER — Emergency Department (HOSPITAL_COMMUNITY)
Admission: EM | Admit: 2015-12-14 | Discharge: 2015-12-14 | Disposition: A | Discharge status: Home / Self Care | Payer: Medicare HMO | Attending: Emergency Medicine | Admitting: Emergency Medicine

## 2015-12-14 ENCOUNTER — Emergency Department (HOSPITAL_COMMUNITY): Payer: Medicare HMO

## 2015-12-14 ENCOUNTER — Encounter (HOSPITAL_COMMUNITY): Payer: Self-pay | Admitting: *Deleted

## 2015-12-14 DIAGNOSIS — R2990 Unspecified symptoms and signs involving the nervous system: Secondary | ICD-10-CM

## 2015-12-14 DIAGNOSIS — Z7982 Long term (current) use of aspirin: Secondary | ICD-10-CM | POA: Diagnosis not present

## 2015-12-14 DIAGNOSIS — Q758 Other specified congenital malformations of skull and face bones: Secondary | ICD-10-CM

## 2015-12-14 DIAGNOSIS — M1990 Unspecified osteoarthritis, unspecified site: Secondary | ICD-10-CM | POA: Diagnosis not present

## 2015-12-14 DIAGNOSIS — Z7951 Long term (current) use of inhaled steroids: Secondary | ICD-10-CM | POA: Insufficient documentation

## 2015-12-14 DIAGNOSIS — R200 Anesthesia of skin: Secondary | ICD-10-CM | POA: Diagnosis not present

## 2015-12-14 DIAGNOSIS — Z79899 Other long term (current) drug therapy: Secondary | ICD-10-CM | POA: Insufficient documentation

## 2015-12-14 DIAGNOSIS — R202 Paresthesia of skin: Secondary | ICD-10-CM | POA: Diagnosis not present

## 2015-12-14 DIAGNOSIS — J45909 Unspecified asthma, uncomplicated: Secondary | ICD-10-CM | POA: Diagnosis not present

## 2015-12-14 DIAGNOSIS — Z791 Long term (current) use of non-steroidal anti-inflammatories (NSAID): Secondary | ICD-10-CM | POA: Insufficient documentation

## 2015-12-14 DIAGNOSIS — R29810 Facial weakness: Secondary | ICD-10-CM | POA: Insufficient documentation

## 2015-12-14 DIAGNOSIS — I10 Essential (primary) hypertension: Secondary | ICD-10-CM | POA: Insufficient documentation

## 2015-12-14 LAB — URINALYSIS, ROUTINE W REFLEX MICROSCOPIC
Bilirubin Urine: NEGATIVE
Glucose, UA: NEGATIVE mg/dL
Hgb urine dipstick: NEGATIVE
Ketones, ur: NEGATIVE mg/dL
NITRITE: NEGATIVE
PH: 5 (ref 5.0–8.0)
Protein, ur: NEGATIVE mg/dL
SPECIFIC GRAVITY, URINE: 1.004 — AB (ref 1.005–1.030)

## 2015-12-14 LAB — ETHANOL

## 2015-12-14 LAB — CBC
HEMATOCRIT: 37 % (ref 36.0–46.0)
Hemoglobin: 12.5 g/dL (ref 12.0–15.0)
MCH: 28.7 pg (ref 26.0–34.0)
MCHC: 33.8 g/dL (ref 30.0–36.0)
MCV: 85.1 fL (ref 78.0–100.0)
PLATELETS: 295 10*3/uL (ref 150–400)
RBC: 4.35 MIL/uL (ref 3.87–5.11)
RDW: 14.6 % (ref 11.5–15.5)
WBC: 9.4 10*3/uL (ref 4.0–10.5)

## 2015-12-14 LAB — URINE MICROSCOPIC-ADD ON

## 2015-12-14 LAB — DIFFERENTIAL
BASOS PCT: 0 %
Basophils Absolute: 0 10*3/uL (ref 0.0–0.1)
EOS PCT: 4 %
Eosinophils Absolute: 0.3 10*3/uL (ref 0.0–0.7)
Lymphocytes Relative: 24 %
Lymphs Abs: 2.3 10*3/uL (ref 0.7–4.0)
MONO ABS: 0.5 10*3/uL (ref 0.1–1.0)
MONOS PCT: 6 %
NEUTROS ABS: 6.2 10*3/uL (ref 1.7–7.7)
Neutrophils Relative %: 66 %

## 2015-12-14 LAB — COMPREHENSIVE METABOLIC PANEL
ALK PHOS: 56 U/L (ref 38–126)
ALT: 10 U/L — AB (ref 14–54)
AST: 20 U/L (ref 15–41)
Albumin: 3.7 g/dL (ref 3.5–5.0)
Anion gap: 15 (ref 5–15)
BUN: 12 mg/dL (ref 6–20)
CALCIUM: 9.7 mg/dL (ref 8.9–10.3)
CHLORIDE: 103 mmol/L (ref 101–111)
CO2: 23 mmol/L (ref 22–32)
CREATININE: 0.93 mg/dL (ref 0.44–1.00)
GFR, EST NON AFRICAN AMERICAN: 59 mL/min — AB (ref >60)
Glucose, Bld: 154 mg/dL — ABNORMAL HIGH (ref 65–99)
Potassium: 4 mmol/L (ref 3.5–5.1)
SODIUM: 141 mmol/L (ref 135–145)
Total Bilirubin: 0.3 mg/dL (ref 0.3–1.2)
Total Protein: 6.3 g/dL — ABNORMAL LOW (ref 6.5–8.1)

## 2015-12-14 LAB — I-STAT TROPONIN, ED: TROPONIN I, POC: 0 ng/mL (ref 0.00–0.08)

## 2015-12-14 LAB — RAPID URINE DRUG SCREEN, HOSP PERFORMED
Amphetamines: NOT DETECTED
Barbiturates: NOT DETECTED
Benzodiazepines: NOT DETECTED
Cocaine: NOT DETECTED
OPIATES: NOT DETECTED
Tetrahydrocannabinol: NOT DETECTED

## 2015-12-14 LAB — I-STAT CHEM 8, ED
BUN: 14 mg/dL (ref 6–20)
CHLORIDE: 103 mmol/L (ref 101–111)
Calcium, Ion: 1.17 mmol/L (ref 1.13–1.30)
Creatinine, Ser: 0.8 mg/dL (ref 0.44–1.00)
GLUCOSE: 150 mg/dL — AB (ref 65–99)
HEMATOCRIT: 39 % (ref 36.0–46.0)
Hemoglobin: 13.3 g/dL (ref 12.0–15.0)
POTASSIUM: 3.8 mmol/L (ref 3.5–5.1)
Sodium: 140 mmol/L (ref 135–145)
TCO2: 24 mmol/L (ref 0–100)

## 2015-12-14 LAB — APTT: APTT: 31 s (ref 24–37)

## 2015-12-14 LAB — PROTIME-INR
INR: 1.04 (ref 0.00–1.49)
PROTHROMBIN TIME: 13.8 s (ref 11.6–15.2)

## 2015-12-14 MED ORDER — LORAZEPAM 2 MG/ML IJ SOLN: 1.0000 mg | Freq: Once | INTRAMUSCULAR | Status: DC

## 2015-12-14 MED ORDER — LORAZEPAM 2 MG/ML IJ SOLN
2.0000 mg | Freq: Once | INTRAMUSCULAR | Status: AC
  Administered 2015-12-14: 2 mg via INTRAMUSCULAR

## 2015-12-14 MED ORDER — LORAZEPAM 2 MG/ML IJ SOLN
1.0000 mg | Freq: Once | INTRAMUSCULAR | Status: DC
  Filled 2015-12-14: qty 1

## 2015-12-14 NOTE — ED Notes (Signed)
Pt placed in gown and on cardiac monitor. Nero at bedside along with rapid response.

## 2015-12-14 NOTE — ED Notes (Signed)
Pt verbalized understanding of d/c instructions and has no further questions. Pt stable and NAD, neuro complete intact.

## 2015-12-14 NOTE — ED Notes (Signed)
Pt states R sided facial and arm numbness since 1630.  No other deficits noted.  Hx of tias.

## 2015-12-14 NOTE — ED Provider Notes (Signed)
CSN: [id number]     Arrival date & time 12/14/15  1704 History   First MD Initiated Contact with Patient 12/14/15 1724     Chief Complaint  Patient presents with  . Numbness     (Consider location/radiation/quality/duration/timing/severity/associated sxs/prior Treatment) HPI At approximately 4:30 this afternoon the patient developed numbness feeling over her right lower face and right arm. No associated headache. No associated visual changes. She reports similar symptoms in the past diagnosed as possible TIA. Patient reports that she has had these episodes occur before and had a carotid ultrasound and echocardiogram done without abnormal results. She reports once before she had symptoms that included decreased sensation in her feet. Past Medical History  Diagnosis Date  . Asthma   . Hypertension   . Arthritis    Past Surgical History  Procedure Laterality Date  . Abdominal surgery    . Cholecystectomy    . Cesarean section     No family history on file. Social History  Substance Use Topics  . Smoking status: Passive Smoke Exposure - Never Smoker    Types: Cigarettes  . Smokeless tobacco: None  . Alcohol Use: Yes   OB History    No data available     Review of Systems  10 Systems reviewed and are negative for acute change except as noted in the HPI.   Allergies  Codeine and Omnicef  Home Medications   Prior to Admission medications   Medication Sig Start Date End Date Taking? Authorizing Provider  acetaminophen (TYLENOL) 500 MG tablet Take 1,000 mg by mouth daily as needed for mild pain or headache.   Yes Historical Provider, MD  ADVAIR DISKUS 250-50 MCG/DOSE AEPB Inhale 1 puff into the lungs 2 (two) times daily.  08/22/15  Yes Historical Provider, MD  ALPRAZolam Duanne Moron) 0.25 MG tablet Take 0.125-0.25 mg by mouth daily as needed for anxiety or sleep.  08/30/15  Yes Historical Provider, MD  amLODipine (NORVASC) 5 MG tablet Take 5 mg by mouth daily. 07/21/15  Yes  Historical Provider, MD  aspirin EC 81 MG tablet Take 81 mg by mouth daily.   Yes Historical Provider, MD  atenolol (TENORMIN) 50 MG tablet Take 50 mg by mouth 2 (two) times daily. 09/10/15  Yes Historical Provider, MD  azelastine (ASTELIN) 0.1 % nasal spray Place 1 spray into both nostrils daily as needed for rhinitis or allergies.  09/26/15  Yes Historical Provider, MD  celecoxib (CELEBREX) 200 MG capsule Take 200 mg by mouth every morning.  09/10/15  Yes Historical Provider, MD  Dextromethorphan-Guaifenesin (ROBITUSSIN COUGH/CHEST DM MAX PO) Take 1 mL by mouth 2 (two) times daily as needed (for cold symptoms).    Yes Historical Provider, MD  fexofenadine (ALLEGRA) 180 MG tablet Take 180 mg by mouth daily.   Yes Historical Provider, MD  fluticasone (FLONASE) 50 MCG/ACT nasal spray Place 1 spray into both nostrils at bedtime.  09/26/15  Yes Historical Provider, MD  losartan (COZAAR) 100 MG tablet Take 100 mg by mouth daily. 07/21/15  Yes Historical Provider, MD  Melatonin 3 MG TABS Take 3 mg by mouth at bedtime as needed (for sleep).   Yes Historical Provider, MD  montelukast (SINGULAIR) 10 MG tablet Take 10 mg by mouth at bedtime. 09/10/15  Yes Historical Provider, MD  Multiple Vitamins-Minerals (CENTRUM SILVER ADULT 50+ PO) Take 1 tablet by mouth daily.   Yes Historical Provider, MD  omeprazole (PRILOSEC) 40 MG capsule Take 40 mg by mouth 2 (two) times daily. 09/10/15  Yes Historical Provider, MD  PROAIR HFA 108 [phone] Base) MCG/ACT inhaler Inhale 1-2 puffs into the lungs every 6 (six) hours as needed for wheezing or shortness of breath.  10/05/15  Yes Historical Provider, MD   BP 148/74 mmHg  Pulse 84  Temp(Src) 98.3 F (36.8 C) (Oral)  Resp 18  Ht 4\' 11"  (1.499 m)  Wt 201 lb (91.173 kg)  BMI 40.58 kg/m2  SpO2 99% Physical Exam  Constitutional: She is oriented to person, place, and time. She appears well-developed and well-nourished.  Patient is moderately obese. She is alert and nontoxic.  No respiratory distress.  HENT:  Head: Normocephalic and atraumatic.  Nose: Nose normal.  Mouth/Throat: Oropharynx is clear and moist.  Eyes: EOM are normal. Pupils are equal, round, and reactive to light.  Neck: Neck supple.  Cardiovascular: Normal rate, regular rhythm, normal heart sounds and intact distal pulses.   Pulmonary/Chest: Effort normal and breath sounds normal.  Abdominal: Soft. Bowel sounds are normal. She exhibits no distension. There is no tenderness.  Musculoskeletal: Normal range of motion. She exhibits no edema or tenderness.  Neurological: She is alert and oriented to person, place, and time. She has normal strength. She exhibits normal muscle tone. Coordination normal. GCS eye subscore is 4. GCS verbal subscore is 5. GCS motor subscore is 6.  On initial evaluation, patient did appear to have slight droop to the right mouth. No pronator drift. Bilateral upper extremity strength symmetric. Lower extremity flexion and extension 5 out of 5.  Skin: Skin is warm, dry and intact.  Psychiatric: She has a normal mood and affect.    ED Course  Procedures (including critical care time) Labs Review Labs Reviewed  COMPREHENSIVE METABOLIC PANEL - Abnormal; Notable for the following:    Glucose, Bld 154 (*)    Total Protein 6.3 (*)    ALT 10 (*)    GFR calc non Af Amer 59 (*)    All other components within normal limits  URINALYSIS, ROUTINE W REFLEX MICROSCOPIC (NOT AT [facility]) - Abnormal; Notable for the following:    Specific Gravity, Urine 1.004 (*)    Leukocytes, UA SMALL (*)    All other components within normal limits  URINE MICROSCOPIC-ADD ON - Abnormal; Notable for the following:    Squamous Epithelial / LPF 0-5 (*)    Bacteria, UA RARE (*)    All other components within normal limits  I-STAT CHEM 8, ED - Abnormal; Notable for the following:    Glucose, Bld 150 (*)    All other components within normal limits  ETHANOL  PROTIME-INR  APTT  CBC  DIFFERENTIAL  URINE  RAPID DRUG SCREEN, HOSP PERFORMED  I-STAT TROPOININ, ED    Imaging Review Ct Head Wo Contrast  12/14/2015  ADDENDUM REPORT: 12/14/2015 17:48 ADDENDUM: Critical Value/emergent results were called by telephone at the time of interpretation on 12/14/2015 at 5:30 pm to Dr. Silverio Decamp , who verbally acknowledged these results. Electronically Signed   By: Skipper Cliche M.D.   On: 12/14/2015 17:48  12/14/2015  CLINICAL DATA:  Right-sided face and arm numbness since 4:30 p.m. EXAM: CT HEAD WITHOUT CONTRAST TECHNIQUE: Contiguous axial images were obtained from the base of the skull through the vertex without intravenous contrast. COMPARISON:  10/12/2015 FINDINGS: Severe diffuse age-related atrophy. Mild low attenuation in the deep white matter. No evidence of vascular territory infarct. No intracranial hemorrhage or extra-axial fluid. Calvarium intact. Visualized portions of the paranasal sinuses clear. Questionable empty sella. IMPRESSION: Age-related involutional  change with no acute findings. Electronically Signed: By: Skipper Cliche M.D. On: 12/14/2015 17:33   Mr Brain Wo Contrast (neuro Protocol)  12/14/2015  CLINICAL DATA:  RIGHT facial and RIGHT arm numbness beginning at 1630 hours. History of transient ischemic attack and hypertension. EXAM: MRI HEAD WITHOUT CONTRAST TECHNIQUE: Multiplanar, multiecho pulse sequences of the brain and surrounding structures were obtained without intravenous contrast. COMPARISON:  CT head December 14, 2015 and MRI of the cervical spine December 18, 2012 FINDINGS: The ventricles and sulci are normal for patient's age. No abnormal parenchymal signal, mass lesions, mass effect. A few scattered subcentimeter supratentorial white matter FLAIR T2 hyperintensities are less than expected for age. No reduced diffusion to suggest acute ischemia. Nonspecific small focus susceptibility artifact RIGHT frontal lobe. No abnormal extra-axial fluid collections. No extra-axial masses though, contrast  enhanced sequences would be more sensitive. Normal major intracranial vascular flow voids seen at the skull base. Ocular status post bilateral ocular lens implants. No abnormal sellar expansion. Linear low T1 through base of dens is not present though, there is no definite bone marrow edema on the coronal T2. New basilar invagination, with effacement of the basion axial interval. Odontoid tip is now flattened in appearance, and there is increased synovial proliferation resulting in ventral medullary deformity, and effacement of the cerebral spinal fluid space at the foramen magnum. The Visualized paranasal sinuses and mastoid air cells are well-aerated. IMPRESSION: No acute intracranial process, specifically no acute ischemia. Negative MRI of the brain for age. Interval suspected nondisplaced base of dens (type 2) C2 fracture appears old with progressed basilar invagination, deforming the ventral medulla, effacing the cerebral spinal fluid space at foramen magnum. Recommend MRI of the cervical spine/craniocervical junction on a nonemergent basis. Electronically Signed   By: Elon Alas M.D.   On: 12/14/2015 21:13   I have personally reviewed and evaluated these images and lab results as part of my medical decision-making.   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 No sig change from previous except increase PVCs. Confirmed by  Johnney Killian, MD, Jeannie Done [phone]) on 12/14/2015 10:38:27 PM     Consult: This case was reviewed with Dr. Silverio Decamp who has seen the patient in the emergency department. At this time he advises for MRI brain. She has otherwise had diagnostic workup for TIA. If MRI shows no acute findings  patient is stable for discharge. MDM   Final diagnoses:  Paresthesia of arm  Facial numbness   A she presents as outlined above with right-sided facial numbness and right arm numbness. MRI does not show CVA pattern. She has had symptoms previously that resolved. This time her symptoms are resolved. A she has been seen by neurology in the past. She is advised to follow-up for recheck next week. MRI noted cervical spine anomaly for further outpatient follow-up. 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    Charlesetta Shanks, MD 12/14/15 2241

## 2015-12-14 NOTE — Code Documentation (Signed)
Patient at home this afternoon when she suddenly felt numbness in her right face and half her tongue.  She took her BP which was 176/100.  She arrived via private vehicle with complaints of right face and arm numbness.  Code Stroke called, Stat head CT and labs done.  Upon my assessment symptoms have resolved.  NIHSS 0.  Dr Silverio Decamp at bedside to assess patient.  Patient educated to alert staff if symptoms return.

## 2015-12-14 NOTE — ED Notes (Signed)
Pt taken to MRI  

## 2015-12-14 NOTE — Discharge Instructions (Signed)
Paresthesia Paresthesia is an abnormal burning or prickling sensation. This sensation is generally felt in the hands, arms, legs, or feet. However, it may occur in any part of the body. Usually, it is not painful. The feeling may be described as:  Tingling or numbness.  Pins and needles.  Skin crawling.  Buzzing.  Limbs falling asleep.  Itching. Most people experience temporary (transient) paresthesia at some time in their lives. Paresthesia may occur when you breathe too quickly (hyperventilation). It can also occur without any apparent cause. Commonly, paresthesia occurs when pressure is placed on a nerve. The sensation quickly goes away after the pressure is removed. For some people, however, paresthesia is a long-lasting (chronic) condition that is caused by an underlying disorder. If you continue to have paresthesia, you may need further medical evaluation. HOME CARE INSTRUCTIONS Watch your condition for any changes. Taking the following actions may help to lessen any discomfort that you are feeling:  Avoid drinking alcohol.  Try acupuncture or massage to help relieve your symptoms.  Keep all follow-up visits as directed by your health care provider. This is important. SEEK MEDICAL CARE IF:  You continue to have episodes of paresthesia.  Your burning or prickling feeling gets worse when you walk.  You have pain, cramps, or dizziness.  You develop a rash. SEEK IMMEDIATE MEDICAL CARE IF:  You feel weak.  You have trouble walking or moving.  You have problems with speech, understanding, or vision.  You feel confused.  You cannot control your bladder or bowel movements.  You have numbness after an injury.  You faint.   This information is not intended to replace advice given to you by your health care provider. Make sure you discuss any questions you have with your health care provider.   Document Released: 09/19/2002 Document Revised: 02/13/2015 Document Reviewed:  09/25/2014 Elsevier Interactive Patient Education 2016 [employer]  

## 2015-12-14 NOTE — Progress Notes (Signed)
Requesting Physician: Dr.  Vallery Ridge    Reason for consultation: Stroke code  HPI:                                                                                                                                         Alisha Terrell is an 75 y.o. female patient who presented to the emergency room for evaluation of acute onset of right face and right arm subjective feeling of numbness earlier this afternoon. Last known normal is unclear.. Patient denies any other neurological symptoms, no vision or speech problems or facial droop or motor weakness in upper or lower extremities or gait or balance problems. She reported having some lightheaded feeling and dizziness. No headache or neck pain symptoms. She had similar symptoms in the past with a similar episode lasting over 20 minutes in December 2016 when she presented to the ER. Apparently she was told that she had a TIA had a negative CT scan of the brain at that time. She called her PCP office today with his symptoms and advised her that she should go to the emergency room for further evaluation.  Date last known well:  12/14/15 Time last known well:  Unknown tPA Given: No: Nonfocal neurological examination, unknown time of last normal  Stroke Risk Factors - hypertension  Past Medical History: Past Medical History  Diagnosis Date  . Asthma   . Hypertension   . Arthritis     Past Surgical History  Procedure Laterality Date  . Abdominal surgery    . Cholecystectomy    . Cesarean section      Family History: No family history on file.  Social History:   reports that she has been passively smoking Cigarettes.  She does not have any smokeless tobacco history on file. She reports that she drinks alcohol. She reports that she does not use illicit drugs.  Allergies:  Allergies  Allergen Reactions  . Codeine Rash and Other (See Comments)    Agitation, bad dreams  . Omnicef [Cefdinir] Nausea And Vomiting and Rash     Medications:                                                                                                                         No current facility-administered medications for this encounter.  Current outpatient prescriptions:  .  acetaminophen (TYLENOL) 500 MG tablet, Take  1,000 mg by mouth daily as needed for mild pain or headache., Disp: , Rfl:  .  ADVAIR DISKUS 250-50 MCG/DOSE AEPB, Inhale 1 puff into the lungs 2 (two) times daily. , Disp: , Rfl:  .  ALPRAZolam (XANAX) 0.25 MG tablet, Take 0.125-0.25 mg by mouth daily as needed for anxiety or sleep. , Disp: , Rfl:  .  amLODipine (NORVASC) 5 MG tablet, Take 5 mg by mouth daily., Disp: , Rfl:  .  aspirin EC 81 MG tablet, Take 81 mg by mouth daily., Disp: , Rfl:  .  atenolol (TENORMIN) 50 MG tablet, Take 50 mg by mouth 2 (two) times daily., Disp: , Rfl:  .  azelastine (ASTELIN) 0.1 % nasal spray, Place 1 spray into both nostrils daily as needed for rhinitis or allergies. , Disp: , Rfl:  .  celecoxib (CELEBREX) 200 MG capsule, Take 200 mg by mouth daily., Disp: , Rfl:  .  Dextromethorphan-Guaifenesin (ROBITUSSIN COUGH/CHEST DM MAX PO), Take 1 mL by mouth 2 (two) times daily as needed (for cold symptoms). , Disp: , Rfl:  .  fexofenadine (ALLEGRA) 180 MG tablet, Take 180 mg by mouth daily., Disp: , Rfl:  .  fluticasone (FLONASE) 50 MCG/ACT nasal spray, , Disp: , Rfl:  .  losartan (COZAAR) 100 MG tablet, Take 100 mg by mouth daily., Disp: , Rfl:  .  Melatonin 3 MG TABS, Take 3 mg by mouth at bedtime as needed (for sleep)., Disp: , Rfl:  .  montelukast (SINGULAIR) 10 MG tablet, Take 10 mg by mouth at bedtime., Disp: , Rfl:  .  Multiple Vitamins-Minerals (CENTRUM SILVER ADULT 50+ PO), Take 1 tablet by mouth daily., Disp: , Rfl:  .  omeprazole (PRILOSEC) 40 MG capsule, Take 40 mg by mouth 2 (two) times daily., Disp: , Rfl:  .  PROAIR HFA 108 (90 Base) MCG/ACT inhaler, Inhale 1-2 puffs into the lungs every 6 (six) hours as needed for wheezing or shortness of  breath. , Disp: , Rfl:  .  traMADol (ULTRAM) 50 MG tablet, Take 50 mg by mouth daily as needed for moderate pain. , Disp: , Rfl:    ROS:                                                                                                                                       History obtained from the patient  General ROS: negative for - chills, fatigue, fever, night sweats, weight gain or weight loss Psychological ROS: negative for - behavioral disorder, hallucinations, memory difficulties, mood swings or suicidal ideation Ophthalmic ROS: negative for - blurry vision, double vision, eye pain or loss of vision ENT ROS: negative for - epistaxis, nasal discharge, oral lesions, sore throat, tinnitus or vertigo Allergy and Immunology ROS: negative for - hives or itchy/watery eyes Hematological and Lymphatic ROS: negative for - bleeding problems, bruising or swollen lymph nodes Endocrine ROS: negative for -  galactorrhea, hair pattern changes, polydipsia/polyuria or temperature intolerance Respiratory ROS: negative for - cough, hemoptysis, shortness of breath or wheezing Cardiovascular ROS: negative for - chest pain, dyspnea on exertion, edema or irregular heartbeat Gastrointestinal ROS: negative for - abdominal pain, diarrhea, hematemesis, nausea/vomiting or stool incontinence Genito-Urinary ROS: negative for - dysuria, hematuria, incontinence or urinary frequency/urgency Musculoskeletal ROS: negative for - joint swelling or muscular weakness Neurological ROS: as noted in HPI Dermatological ROS: negative for rash and skin lesion changes  Neurologic Examination:                                                                                                      Blood pressure 161/94, pulse 85, temperature 98.1 F (36.7 C), temperature source Oral, resp. rate 16, height 4\' 11"  (1.499 m), weight 91.173 kg (201 lb), SpO2 99 %.  Evaluation of higher integrative functions including: Level of alertness:  Alert,  Oriented to time, place and person Speech: fluent, no evidence of dysarthria or aphasia noted.  Test the following cranial nerves: 2-12 grossly intact Motor examination: Normal tone, bulk, full 5/5 motor strength in all 4 extremities Examination of sensation : Normal and symmetric sensation to pinprick in all 4 extremities and on face Examination of deep tendon reflexes: 2+, normal and symmetric in all extremities, normal plantars bilaterally Test coordination: Normal finger nose testing, with no evidence of limb appendicular ataxia or abnormal involuntary movements or tremors noted.     Lab Results: Basic Metabolic Panel:  Recent Labs Lab 12/14/15 1726  NA 140  K 3.8  CL 103  GLUCOSE 150*  BUN 14  CREATININE 0.80    Liver Function Tests: No results for input(s): AST, ALT, ALKPHOS, BILITOT, PROT, ALBUMIN in the last 168 hours. No results for input(s): LIPASE, AMYLASE in the last 168 hours. No results for input(s): AMMONIA in the last 168 hours.  CBC:  Recent Labs Lab 12/14/15 1726 12/14/15 1732  WBC  --  9.4  NEUTROABS  --  6.2  HGB 13.3 12.5  HCT 39.0 37.0  MCV  --  85.1  PLT  --  295    Cardiac Enzymes: No results for input(s): CKTOTAL, CKMB, CKMBINDEX, TROPONINI in the last 168 hours.  Lipid Panel: No results for input(s): CHOL, TRIG, HDL, CHOLHDL, VLDL, LDLCALC in the last 168 hours.  CBG: No results for input(s): GLUCAP in the last 168 hours.  Microbiology: No results found for this or any previous visit.   Imaging: Ct Head Wo Contrast  12/14/2015  ADDENDUM REPORT: 12/14/2015 17:48 ADDENDUM: Critical Value/emergent results were called by telephone at the time of interpretation on 12/14/2015 at 5:30 pm to Dr. Silverio Decamp , who verbally acknowledged these results. Electronically Signed   By: Skipper Cliche M.D.   On: 12/14/2015 17:48  12/14/2015  CLINICAL DATA:  Right-sided face and arm numbness since 4:30 p.m. EXAM: CT HEAD WITHOUT CONTRAST TECHNIQUE:  Contiguous axial images were obtained from the base of the skull through the vertex without intravenous contrast. COMPARISON:  10/12/2015 FINDINGS: Severe diffuse age-related atrophy. Mild low attenuation in the  deep white matter. No evidence of vascular territory infarct. No intracranial hemorrhage or extra-axial fluid. Calvarium intact. Visualized portions of the paranasal sinuses clear. Questionable empty sella. IMPRESSION: Age-related involutional change with no acute findings. Electronically Signed: By: Skipper Cliche M.D. On: 12/14/2015 17:33    Assessment and plan:   Alisha Terrell is an 75 y.o. female patient who presented for further evaluation of subjective feeling of right face and right arm numbness symptoms. At the time of evaluation in the ER, she had no numbness to pinprick sensation. Grossly nonfocal neurological examination. NIH SS of 0. Time of onset of her symptoms is also unclear. Hence, she is not considered a candidate for  acute IV TPA.  Given the significant concern about possible TIA, recommend further neurodiagnostic workup with a brain MRI without contrast. If the MRI does not show any acute pathology, she can be discharged home and follow-up with her regular outpatient neurologist, continue her home medications. CT of the head done in the ER did not show any acute pathology.  At the time of finalizing this note, MRI of the brain is completed which showed no evidence of acute stroke. But incidental interval suspected nondisplaced base of dens (type 2) C2 fracture appears old with progressed basilar invagination, deforming the ventral medulla, effacing the cerebral spinal fluid space at foramen magnum. Recommend further evaluation of the cervical spine MRI.  Since patient has no active symptoms or abnormality noted on neurological exam at this time concerning for low brainstem pathology, cervical spine MRI can be completed on a non-emergent basis through her outpatient  neurologist with a follow-up visit in 1-2 weeks.   Several of the patient's, and her husband's questions have been answered to their satisfaction.

## 2015-12-24 ENCOUNTER — Encounter: Payer: Self-pay | Admitting: Neurology

## 2015-12-24 ENCOUNTER — Ambulatory Visit (INDEPENDENT_AMBULATORY_CARE_PROVIDER_SITE_OTHER): Payer: Medicare HMO | Admitting: Neurology

## 2015-12-24 VITALS — BP 134/80 | HR 83 | Ht 59.0 in | Wt 200.8 lb

## 2015-12-24 DIAGNOSIS — G43109 Migraine with aura, not intractable, without status migrainosus: Secondary | ICD-10-CM

## 2015-12-24 DIAGNOSIS — G459 Transient cerebral ischemic attack, unspecified: Secondary | ICD-10-CM

## 2015-12-24 DIAGNOSIS — I10 Essential (primary) hypertension: Secondary | ICD-10-CM | POA: Diagnosis not present

## 2015-12-24 DIAGNOSIS — G9520 Unspecified cord compression: Secondary | ICD-10-CM

## 2015-12-24 NOTE — Progress Notes (Signed)
Chart forwarded.  

## 2015-12-24 NOTE — Patient Instructions (Addendum)
I would increase aspirin to 325mg  daily.  I will send note to Dr. Tollie Pizza to see if there can be any changes made to your GERD medication in order to take Plavix I think you need to be evaluated by a neurosurgeon.  If Dr. Maia Petties has no preference, contact us and we can refer you Follow up as scheduled.

## 2015-12-24 NOTE — Progress Notes (Addendum)
NEUROLOGY FOLLOW UP OFFICE NOTE  TERRILYNN ROUNTREE [id number]  HISTORY OF PRESENT ILLNESS: Alisha Terrell is a 75 year old right-handed female with hypertension who follows up for TIA.  Recent history obtained by patient and ED note.  Labs, echo and carotid reports and imaging of brain MRI reviewed.  UPDATE: Fasting lipid panel from 11/14/15 showed cholesterol 134, TG 24, HDL 78.70, and LDL 51.  Hgb A1c was 6%.   2D echo from 11/19/15 demonstrated EF 55-60% with no cardiac source of emboli.  Carotid doppler showed no hemodynamically significant ICA stenosis.  On 12/14/15, she had a recurrent episode of numbness involving the right lower side of her face and right hand, lasting a few minutes.  She went to the ED where blood pressure was 141/94.  CBC and CMP were unremarkable.  UA showed only small leukocytes but otherwise unremarkable.  EKG showed no acute changes.  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  HISTORY: On 10/12/15, she developed numbness on the right lower side of her face and lips, as well as the 4th and 5th digits of her right hand.  It lasted 15 minutes and resolved.  Systolic blood pressure was in the low 200s (typically it is in the 140s).  At the time, she felt unsteady but denied dizziness, visual disturbance or focal weakness.  She was evaluated at the ED.  EKG showed sinus rhythm.  Troponins, CBC and BMP were unremarkable.  Glucose was 113.  CT of head showed no acute intracranial process.  She declined MRI due to claustrophobia.  She has been on ASA 81mg  daily for several years and has doubled the dose since this event.  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  In November, she had increase in these auras.  However,  on one occasion, she developed perioral numbness and tingling for short time.  It was preceded earlier in the day by a headache.    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  PAST MEDICAL HISTORY: Past Medical History  Diagnosis Date  . Asthma   . Hypertension   . Arthritis     MEDICATIONS: Current Outpatient Prescriptions on File Prior to Visit  Medication Sig Dispense Refill  . acetaminophen (TYLENOL) 500 MG tablet Take 1,000 mg by mouth daily as needed for mild pain or headache.    . ADVAIR DISKUS 250-50 MCG/DOSE AEPB Inhale 1 puff into the lungs 2 (two) times daily.     Marland Kitchen ALPRAZolam (XANAX) 0.25 MG tablet Take 0.125-0.25 mg by mouth daily as needed for anxiety or sleep.     Marland Kitchen amLODipine (NORVASC) 5 MG tablet Take 5 mg by mouth daily.    Marland Kitchen aspirin EC 81 MG tablet Take 81 mg by mouth daily.    Marland Kitchen atenolol (TENORMIN) 50 MG tablet Take 50 mg by mouth 2 (two) times daily.    Marland Kitchen azelastine (ASTELIN) 0.1 % nasal spray Place 1 spray into both nostrils daily as needed for rhinitis or allergies.     . celecoxib (CELEBREX) 200 MG capsule Take 200 mg by mouth every morning.     Marland Kitchen Dextromethorphan-Guaifenesin (ROBITUSSIN COUGH/CHEST DM MAX PO) Take 1 mL  by mouth 2 (two) times daily as needed (for cold symptoms).     . fexofenadine (ALLEGRA) 180 MG tablet Take 180 mg by mouth daily.    . fluticasone (FLONASE) 50 MCG/ACT nasal spray Place 1 spray into both nostrils at bedtime.     Marland Kitchen losartan (COZAAR) 100 MG tablet Take 100 mg by mouth daily.    . Melatonin 3 MG TABS Take 3 mg by mouth at bedtime as needed (for sleep).    . montelukast (SINGULAIR) 10 MG tablet Take 10 mg by mouth at bedtime.    . Multiple Vitamins-Minerals (CENTRUM SILVER ADULT 50+ PO) Take 1 tablet by mouth daily.    Marland Kitchen omeprazole (PRILOSEC) 40 MG capsule Take 40 mg by mouth 2 (two) times daily.    Marland Kitchen PROAIR HFA 108 (90 Base) MCG/ACT inhaler Inhale 1-2  puffs into the lungs every 6 (six) hours as needed for wheezing or shortness of breath.      No current facility-administered medications on file prior to visit.    ALLERGIES: Allergies  Allergen Reactions  . Codeine Rash and Other (See Comments)    Agitation, bad dreams  . Omnicef [Cefdinir] Nausea And Vomiting and Rash    FAMILY HISTORY: History reviewed. No pertinent family history.  SOCIAL HISTORY: Social History   Social History  . Marital Status: Married    Spouse Name: N/A  . Number of Children: N/A  . Years of Education: N/A   Occupational History  . Not on file.   Social History Main Topics  . Smoking status: Passive Smoke Exposure - Never Smoker    Types: Cigarettes  . Smokeless tobacco: Not on file  . Alcohol Use: Yes  . Drug Use: No  . Sexual Activity: Not on file   Other Topics Concern  . Not on file   Social History Narrative    REVIEW OF SYSTEMS: Constitutional: No fevers, chills, or sweats, no generalized fatigue, change in appetite Eyes: No visual changes, double vision, eye pain Ear, nose and throat: No hearing loss, ear pain, nasal congestion, sore throat Cardiovascular: No chest pain, palpitations Respiratory:  No shortness of breath at rest or with exertion, wheezes GastrointestinaI: No nausea, vomiting, diarrhea, abdominal pain, fecal incontinence Genitourinary:  No dysuria, urinary retention or frequency Musculoskeletal:  No neck pain, back pain Integumentary: No rash, pruritus, skin lesions Neurological: as above Psychiatric: No depression, insomnia, anxiety Endocrine: No palpitations, fatigue, diaphoresis, mood swings, change in appetite, change in weight, increased thirst Hematologic/Lymphatic:  No anemia, purpura, petechiae. Allergic/Immunologic: no itchy/runny eyes, nasal congestion, recent allergic reactions, rashes  PHYSICAL EXAM: Filed Vitals:   12/24/15 1513  BP: 134/80  Pulse: 83   General: No acute distress.  Patient  appears well-groomed.  Head:  Normocephalic/atraumatic Eyes:  Fundi examined but not visualized. Neck: supple, no paraspinal tenderness, full range of motion Heart:  Regular rate and rhythm Lungs:  Clear to auscultation bilaterally Back: No paraspinal tenderness Neurological Exam: alert and oriented to person, place, and time. Attention span and concentration intact, recent and remote memory intact, fund of knowledge intact.  Speech fluent and not dysarthric, language intact.  CN II-XII intact. Bulk and tone normal, muscle strength 5/5 throughout.  Sensation to light touch intact.  Deep tendon reflexes 2+ throughout.  Finger to nose testing intact.  Gait normal  IMPRESSION: TIA, affecting the lateral postcentral gyrus.  Complex migraine or simple partial seizure is also considered. 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 Morbid obesity (BMI greater than 40)  PLAN: 1.  I advised her to increase ASA from 81mg  to 325mg  daily.  I would like to switch her antiplatelet therapy to Plavix.  She takes high-dose omeprazole for GERD, which will decrease efficacy of Plavix.  Therefore, I defer to Dr. Tollie Pizza regarding any alternative medication that will not decrease the efficacy of Plavix.  If this is not possible, then she may remain on ASA 325mg  daily. 2.  Will check routine EEG to look for evidence of risk for partial seizures 3.  I think she will need referral to neurosurgery for evaluation of the dens.  She is following up with her long-term pain specialist, Dr. Maia Petties, in 3 days.  She will check with him to see if there is somebody he recommends.  Otherwise, she will contact us and we will make the referral.  ADDENDUM:  Unilateral lower facial and extremity numbness may be seen in upper cord lesions.  Given pathology on MRI of brain, will get MRI of cervical spine as well. 4.  Weight loss 5.  Follow up as already scheduled.  Metta Clines, DO  CC:  Juanita Craver, MD  Zonia Kief, MD

## 2015-12-25 ENCOUNTER — Telehealth: Payer: Self-pay

## 2015-12-25 DIAGNOSIS — R200 Anesthesia of skin: Secondary | ICD-10-CM

## 2015-12-25 NOTE — Telephone Encounter (Signed)
Unilateral lower facial and extremity numbness may be seen in upper cord lesions. Given pathology on MRI of brain, will get MRI of cervical spine as well.

## 2015-12-25 NOTE — Telephone Encounter (Signed)
Scheduled for 01/02/16 @ 1:30 at [facility] [address]. Pt aware.

## 2015-12-25 NOTE — Telephone Encounter (Signed)
Order placed. Will call pt later and have her schedule MRI appointment at GI.

## 2015-12-26 ENCOUNTER — Ambulatory Visit (INDEPENDENT_AMBULATORY_CARE_PROVIDER_SITE_OTHER): Payer: Medicare HMO | Admitting: Neurology

## 2015-12-26 DIAGNOSIS — G459 Transient cerebral ischemic attack, unspecified: Secondary | ICD-10-CM | POA: Diagnosis not present

## 2015-12-26 NOTE — Procedures (Signed)
ELECTROENCEPHALOGRAM REPORT  Date of Study: 12/26/2015  Patient's Name: Alisha Terrell MRN: [id number] Date of Birth: April 19, 1941  Indication: 75 year old female with recurrent left facial and hand numbness  Medications: celebrex Melatonin Xanax Atenolol norvasc Cozaar  Technical Summary: This is a multichannel digital EEG recording, using the international 10-20 placement system with electrodes applied with paste and impedances below 5000 ohms.    Description: The EEG background is symmetric, with a well-developed posterior dominant rhythm of 9 Hz, which is reactive to eye opening and closing.  Diffuse beta activity is seen, with a bilateral frontal preponderance.  No focal or generalized abnormalities are seen.  No focal or generalized epileptiform discharges are seen.  Stage II sleep is not seen.  Photic stimulation was performed, and produced no abnormalities.  ECG revealed normal cardiac rate and rhythm.  Impression: This is a normal routine EEG of the awake and drowsy states, with activating procedures.  A normal study does not rule out the possibility of a seizure disorder in this patient.  Donnalynn Wheeless R. Tomi Likens, DO

## 2015-12-27 ENCOUNTER — Telehealth: Payer: Self-pay

## 2015-12-27 NOTE — Telephone Encounter (Signed)
Mychart message sent.

## 2015-12-27 NOTE — Telephone Encounter (Signed)
-----   Message from Pieter Partridge, DO sent at 12/27/2015  7:07 AM EDT ----- eeg is normal.

## 2016-01-01 MED ORDER — DIAZEPAM 5 MG PO TABS: ORAL_TABLET | ORAL | Status: DC

## 2016-01-01 NOTE — Addendum Note (Signed)
Addended by: Gerda Diss A on: 01/01/2016 12:00 PM   Modules accepted: Orders

## 2016-01-02 ENCOUNTER — Ambulatory Visit
Admission: RE | Admit: 2016-01-02 | Discharge: 2016-01-02 | Disposition: A | Discharge status: Home / Self Care | Payer: Medicare HMO | Source: Ambulatory Visit | Attending: Neurology | Admitting: Neurology

## 2016-01-02 DIAGNOSIS — R200 Anesthesia of skin: Secondary | ICD-10-CM

## 2016-01-08 ENCOUNTER — Telehealth: Payer: Self-pay

## 2016-01-08 NOTE — Telephone Encounter (Signed)
Pt calling for MRI results done 01/02/16. I dont see results. Please advise.

## 2016-01-08 NOTE — Telephone Encounter (Signed)
Pt is aware.  

## 2016-01-08 NOTE — Telephone Encounter (Signed)
See result note for MRI cervical spine

## 2016-01-08 NOTE — Telephone Encounter (Signed)
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 I don't think it is causing episodic numbness into the face, but it is a cause for neck pain. Does she have a rheumatologist? She should probably discuss with PCP regarding treatment.

## 2016-01-08 NOTE — Telephone Encounter (Signed)
Pt wondering if you had decided what she needs to do about the abnormal size of the brain seen on last MRI. Pt states you and her had discussed Neurosurgery. Please advise.

## 2016-01-09 NOTE — Telephone Encounter (Signed)
The question on the previous MRI was the arthritis in the upper neck, not the brain.  I don't think there is anything surgical, but she does have significant arthritis which should be addressed to her PCP.

## 2016-01-17 ENCOUNTER — Emergency Department (HOSPITAL_COMMUNITY)
Admission: EM | Admit: 2016-01-17 | Discharge: 2016-01-17 | Disposition: A | Discharge status: Home / Self Care | Payer: Medicare HMO | Attending: Emergency Medicine | Admitting: Emergency Medicine

## 2016-01-17 ENCOUNTER — Emergency Department (HOSPITAL_COMMUNITY): Payer: Medicare HMO

## 2016-01-17 ENCOUNTER — Telehealth: Payer: Self-pay

## 2016-01-17 DIAGNOSIS — Z7982 Long term (current) use of aspirin: Secondary | ICD-10-CM | POA: Diagnosis not present

## 2016-01-17 DIAGNOSIS — R51 Headache: Secondary | ICD-10-CM | POA: Diagnosis not present

## 2016-01-17 DIAGNOSIS — Q670 Congenital facial asymmetry: Secondary | ICD-10-CM | POA: Diagnosis not present

## 2016-01-17 DIAGNOSIS — Z79899 Other long term (current) drug therapy: Secondary | ICD-10-CM | POA: Diagnosis not present

## 2016-01-17 DIAGNOSIS — R42 Dizziness and giddiness: Secondary | ICD-10-CM | POA: Insufficient documentation

## 2016-01-17 DIAGNOSIS — G459 Transient cerebral ischemic attack, unspecified: Secondary | ICD-10-CM

## 2016-01-17 DIAGNOSIS — R531 Weakness: Secondary | ICD-10-CM | POA: Diagnosis present

## 2016-01-17 DIAGNOSIS — I10 Essential (primary) hypertension: Secondary | ICD-10-CM | POA: Diagnosis not present

## 2016-01-17 DIAGNOSIS — M1990 Unspecified osteoarthritis, unspecified site: Secondary | ICD-10-CM | POA: Insufficient documentation

## 2016-01-17 DIAGNOSIS — J45909 Unspecified asthma, uncomplicated: Secondary | ICD-10-CM | POA: Diagnosis not present

## 2016-01-17 DIAGNOSIS — Z7951 Long term (current) use of inhaled steroids: Secondary | ICD-10-CM | POA: Insufficient documentation

## 2016-01-17 LAB — CBC
HEMATOCRIT: 40.5 % (ref 36.0–46.0)
HEMOGLOBIN: 13.3 g/dL (ref 12.0–15.0)
MCH: 27.7 pg (ref 26.0–34.0)
MCHC: 32.8 g/dL (ref 30.0–36.0)
MCV: 84.4 fL (ref 78.0–100.0)
Platelets: 294 10*3/uL (ref 150–400)
RBC: 4.8 MIL/uL (ref 3.87–5.11)
RDW: 14.5 % (ref 11.5–15.5)
WBC: 12.2 10*3/uL — ABNORMAL HIGH (ref 4.0–10.5)

## 2016-01-17 LAB — COMPREHENSIVE METABOLIC PANEL
ALBUMIN: 3.6 g/dL (ref 3.5–5.0)
ALK PHOS: 57 U/L (ref 38–126)
ALT: 19 U/L (ref 14–54)
ANION GAP: 9 (ref 5–15)
AST: 19 U/L (ref 15–41)
BUN: 18 mg/dL (ref 6–20)
CALCIUM: 9.2 mg/dL (ref 8.9–10.3)
CO2: 24 mmol/L (ref 22–32)
Chloride: 108 mmol/L (ref 101–111)
Creatinine, Ser: 0.91 mg/dL (ref 0.44–1.00)
GFR calc Af Amer: >60 mL/min (ref >60)
GFR calc non Af Amer: >60 mL/min (ref >60)
GLUCOSE: 127 mg/dL — AB (ref 65–99)
Potassium: 3.9 mmol/L (ref 3.5–5.1)
Sodium: 141 mmol/L (ref 135–145)
Total Bilirubin: 0.6 mg/dL (ref 0.3–1.2)
Total Protein: 6.5 g/dL (ref 6.5–8.1)

## 2016-01-17 LAB — DIFFERENTIAL
Basophils Absolute: 0 10*3/uL (ref 0.0–0.1)
Basophils Relative: 0 %
EOS PCT: 1 %
Eosinophils Absolute: 0.1 10*3/uL (ref 0.0–0.7)
LYMPHS ABS: 1.9 10*3/uL (ref 0.7–4.0)
LYMPHS PCT: 16 %
MONO ABS: 1 10*3/uL (ref 0.1–1.0)
Monocytes Relative: 8 %
NEUTROS PCT: 75 %
Neutro Abs: 9.2 10*3/uL — ABNORMAL HIGH (ref 1.7–7.7)

## 2016-01-17 LAB — I-STAT CHEM 8, ED
BUN: 22 mg/dL — ABNORMAL HIGH (ref 6–20)
CHLORIDE: 104 mmol/L (ref 101–111)
Calcium, Ion: 1.15 mmol/L (ref 1.13–1.30)
Creatinine, Ser: 0.7 mg/dL (ref 0.44–1.00)
Glucose, Bld: 120 mg/dL — ABNORMAL HIGH (ref 65–99)
HEMATOCRIT: 43 % (ref 36.0–46.0)
HEMOGLOBIN: 14.6 g/dL (ref 12.0–15.0)
POTASSIUM: 3.8 mmol/L (ref 3.5–5.1)
Sodium: 140 mmol/L (ref 135–145)
TCO2: 23 mmol/L (ref 0–100)

## 2016-01-17 LAB — I-STAT TROPONIN, ED: TROPONIN I, POC: 0 ng/mL (ref 0.00–0.08)

## 2016-01-17 LAB — APTT: aPTT: 26 seconds (ref 24–37)

## 2016-01-17 LAB — PROTIME-INR
INR: 1.03 (ref 0.00–1.49)
Prothrombin Time: 13.7 seconds (ref 11.6–15.2)

## 2016-01-17 MED ORDER — VALPROATE SODIUM 500 MG/5ML IV SOLN
500.0000 mg | Freq: Once | INTRAVENOUS | Status: AC
Start: 2016-01-17 — End: 2016-01-17
  Administered 2016-01-17: 500 mg via INTRAVENOUS
  Filled 2016-01-17: qty 5

## 2016-01-17 MED ORDER — FAMOTIDINE 20 MG PO TABS
20.0000 mg | ORAL_TABLET | Freq: Two times a day (BID) | ORAL | Status: DC
  Administered 2016-01-17: 20 mg via ORAL
  Filled 2016-01-17: qty 1

## 2016-01-17 MED ORDER — DIVALPROEX SODIUM 250 MG PO DR TAB: 250.0000 mg | DELAYED_RELEASE_TABLET | Freq: Two times a day (BID) | ORAL | Status: DC

## 2016-01-17 MED ORDER — KETOROLAC TROMETHAMINE 15 MG/ML IJ SOLN
15.0000 mg | Freq: Once | INTRAMUSCULAR | Status: AC
  Administered 2016-01-17: 15 mg via INTRAVENOUS
  Filled 2016-01-17: qty 1

## 2016-01-17 MED ORDER — DIVALPROEX SODIUM 250 MG PO DR TAB
250.0000 mg | DELAYED_RELEASE_TABLET | Freq: Two times a day (BID) | ORAL | Status: DC
  Filled 2016-01-17: qty 1

## 2016-01-17 NOTE — ED Notes (Signed)
[facility] contacted to cancel Code Stroke 

## 2016-01-17 NOTE — ED Provider Notes (Signed)
CSN: [id number]     Arrival date & time 01/17/16  1116 History   First MD Initiated Contact with Patient 01/17/16 1125     Chief Complaint  Patient presents with  . Code Stroke     (Consider location/radiation/quality/duration/timing/severity/associated sxs/prior Treatment) Patient is a 75 y.o. female presenting with neurologic complaint. The history is provided by the patient and the spouse.  Neurologic Problem This is a recurrent problem. The current episode started today (onset at 10:10am, symptoms of right sided facial droop, right sided paresthesias). The problem has been resolved. Associated symptoms include headaches and weakness. Pertinent negatives include no abdominal pain, arthralgias, chest pain, chills, coughing, diaphoresis, fatigue, fever, myalgias, nausea, rash, sore throat, vertigo (light headedness but no vertigo) or vomiting. Nothing aggravates the symptoms. She has tried nothing for the symptoms.    Past Medical History  Diagnosis Date  . Asthma   . Hypertension   . Arthritis    Past Surgical History  Procedure Laterality Date  . Abdominal surgery    . Cholecystectomy    . Cesarean section     No family history on file. Social History  Substance Use Topics  . Smoking status: Passive Smoke Exposure - Never Smoker    Types: Cigarettes  . Smokeless tobacco: Not on file  . Alcohol Use: Yes   OB History    No data available     Review of Systems  Constitutional: Negative for fever, chills, diaphoresis, activity change, appetite change and fatigue.  HENT: Negative for facial swelling, rhinorrhea, sore throat, trouble swallowing and voice change.   Eyes: Negative for photophobia, pain and visual disturbance.  Respiratory: Negative for cough, shortness of breath, wheezing and stridor.   Cardiovascular: Negative for chest pain, palpitations and leg swelling.  Gastrointestinal: Negative for nausea, vomiting, abdominal pain, constipation and anal bleeding.   Endocrine: Negative.   Genitourinary: Negative for dysuria, vaginal bleeding, vaginal discharge and vaginal pain.  Musculoskeletal: Negative for myalgias, back pain and arthralgias.  Skin: Negative.  Negative for rash.  Allergic/Immunologic: Negative.   Neurological: Positive for facial asymmetry, weakness, light-headedness and headaches. Negative for dizziness, vertigo (light headedness but no vertigo), tremors and syncope.  Psychiatric/Behavioral: Negative for suicidal ideas, sleep disturbance and self-injury.  All other systems reviewed and are negative.     Allergies  Codeine and Omnicef  Home Medications   Prior to Admission medications   Medication Sig Start Date End Date Taking? Authorizing Provider  ADVAIR DISKUS 250-50 MCG/DOSE AEPB Inhale 1 puff into the lungs 2 (two) times daily.  08/22/15  Yes Historical Provider, MD  amLODipine (NORVASC) 5 MG tablet Take 5 mg by mouth daily. 07/21/15  Yes Historical Provider, MD  aspirin 325 MG tablet Take 325 mg by mouth daily.   Yes Historical Provider, MD  atenolol (TENORMIN) 50 MG tablet Take 50 mg by mouth 2 (two) times daily. 09/10/15  Yes Historical Provider, MD  celecoxib (CELEBREX) 200 MG capsule Take 200 mg by mouth every morning.  09/10/15  Yes Historical Provider, MD  Dextromethorphan-Guaifenesin (ROBITUSSIN COUGH/CHEST DM MAX PO) Take 1 mL by mouth 2 (two) times daily as needed (for cold symptoms).    Yes Historical Provider, MD  fexofenadine (ALLEGRA) 180 MG tablet Take 180 mg by mouth daily.   Yes Historical Provider, MD  fluticasone (FLONASE) 50 MCG/ACT nasal spray Place 1 spray into both nostrils at bedtime.  09/26/15  Yes Historical Provider, MD  losartan (COZAAR) 100 MG tablet Take 100 mg by mouth daily.  07/21/15  Yes Historical Provider, MD  montelukast (SINGULAIR) 10 MG tablet Take 10 mg by mouth at bedtime. 09/10/15  Yes Historical Provider, MD  Multiple Vitamins-Minerals (CENTRUM SILVER ADULT 50+ PO) Take 1 tablet by  mouth daily.   Yes Historical Provider, MD  omeprazole (PRILOSEC) 40 MG capsule Take 40 mg by mouth at bedtime.  09/10/15  Yes Historical Provider, MD  PROAIR HFA 108 (90 Base) MCG/ACT inhaler Inhale 1-2 puffs into the lungs every 6 (six) hours as needed for wheezing or shortness of breath.  10/05/15  Yes Historical Provider, MD  sodium chloride (OCEAN) 0.65 % SOLN nasal spray Place 1 spray into both nostrils as needed for congestion.   Yes Historical Provider, MD   BP 115/72 mmHg  Pulse 66  Temp(Src) 97.9 F (36.6 C) (Oral)  Resp 17  SpO2 96% Physical Exam  Constitutional: She is oriented to person, place, and time. She appears well-developed and well-nourished. No distress.  HENT:  Head: Normocephalic and atraumatic.  Right Ear: External ear normal.  Left Ear: External ear normal.  Mouth/Throat: No oropharyngeal exudate.  Eyes: Conjunctivae and EOM are normal. Pupils are equal, round, and reactive to light. No scleral icterus.  Neck: Normal range of motion. Neck supple. No JVD present. No tracheal deviation present. No thyromegaly present.  Cardiovascular: Normal rate, regular rhythm and intact distal pulses.   Pulmonary/Chest: Effort normal and breath sounds normal. No respiratory distress. She has no wheezes. She has no rales.  Abdominal: Soft. Bowel sounds are normal. She exhibits no distension. There is no tenderness.  Musculoskeletal: Normal range of motion. She exhibits no edema or tenderness.  Neurological: She is alert and oriented to person, place, and time. No cranial nerve deficit. She exhibits normal muscle tone. Coordination normal.  Clear speech. No facial droop or CN deficit appreciated. 5/5 strength in all 4 extremities. Sensation intact and normal in all 4 extremities. Normal gait. Normal finger to nose and heel to shin. Negative romberg.   Skin: Skin is warm and dry. She is not diaphoretic. No pallor.  Psychiatric: She has a normal mood and affect. She expresses no  homicidal and no suicidal ideation. She expresses no suicidal plans and no homicidal plans.  Nursing note and vitals reviewed.   ED Course  Procedures (including critical care time) Labs Review Labs Reviewed  CBC - Abnormal; Notable for the following:    WBC 12.2 (*)    All other components within normal limits  DIFFERENTIAL - Abnormal; Notable for the following:    Neutro Abs 9.2 (*)    All other components within normal limits  COMPREHENSIVE METABOLIC PANEL - Abnormal; Notable for the following:    Glucose, Bld 127 (*)    All other components within normal limits  I-STAT CHEM 8, ED - Abnormal; Notable for the following:    BUN 22 (*)    Glucose, Bld 120 (*)    All other components within normal limits  PROTIME-INR  APTT  I-STAT TROPOININ, ED  CBG MONITORING, ED    Imaging Review Ct Head Wo Contrast  01/17/2016  CLINICAL DATA:  Code stroke, sudden onset RIGHT facial numbness at 1000 hours, history asthma, hypertension EXAM: CT HEAD WITHOUT CONTRAST TECHNIQUE: Contiguous axial images were obtained from the base of the skull through the vertex without intravenous contrast. COMPARISON:  12/14/2015 CT head, correlation MR cervical spine 01/02/2016 FINDINGS: Generalized atrophy. Normal ventricular morphology. No midline shift or mass effect. Otherwise normal appearance of brain parenchyma. No intracranial  hemorrhage, mass lesion or evidence acute infarction. No extra-axial fluid collections. Prominent degenerative changes at the tip of the odontoid process again identified, effacing CSF at the ventral aspect of the thecal sac and abutting the brainstem ; no definite mass effect upon the brainstem was seen on recent MR. Atherosclerotic calcifications at the carotid siphons. Bones and sinuses otherwise unremarkable. IMPRESSION: Generalized atrophy. No acute intracranial abnormalities. Findings called to Dr.  Silverio Decamp on 01/17/2016 at 1143 hours. Electronically Signed   By: Lavonia Dana M.D.    On: 01/17/2016 11:43   I have personally reviewed and evaluated these images and lab results as part of my medical decision-making.   EKG Interpretation None      MDM   Final diagnoses:  Transient cerebral ischemia, unspecified transient cerebral ischemia type    The patient is a 75 year old female with a history of hypertension, headaches, and previous TIAs with a negative stroke workup who presents with sudden onset of right-sided facial droop, right hand paresthesias, lightheadedness, and headache today at 10:10 AM. These symptoms have resolved by the time the patient has arrived in the emergency department. Patient is afebrile hemolytically stable. Neurologic exam unremarkable. Head CT shows no acute findings. Neurology's consultation evaluates the patient ED, please see their note for more detail. Given patient's current neurologic exam and previous recent negative workup stay do not feel further workup beyond head CT is indicated in the emergency department. They recommend treatment of headache with Depakote and discharged home in headache has improved with outpatient neurology follow-up. Patient treated with Depakote with significant resolution of headache. She feels comfortable with this plan. Repeat neurologic exam again completely within normal limits. Patient discharged with standard ED return precautions.  Patient seen with attending, Dr. Jeanell Sparrow, who oversaw clinical decision making.   Margaretann Loveless, MD 01/17/16 1438  Pattricia Boss, MD 01/17/16 [id number]

## 2016-01-17 NOTE — ED Notes (Signed)
[facility] paged out at [facility]	[place] by [employer]

## 2016-01-17 NOTE — ED Notes (Signed)
Pt in c/o onset of R sided facial droop, R hand numbness & dizziness onset today @ 10:10, Code Stroke called in Triage, airway intact

## 2016-01-17 NOTE — Telephone Encounter (Signed)
Pt called w/ complaints of R facial numbness, R facial drooping, R hand and finger numbness, right side numbness for 20 minutes. Pt states these are the same symptoms she has experience during her TIAs. Pt was advised to go to emergency room.

## 2016-01-18 ENCOUNTER — Telehealth: Payer: Self-pay | Admitting: Neurology

## 2016-01-18 MED ORDER — VENLAFAXINE HCL ER 37.5 MG PO CP24: ORAL_CAPSULE | ORAL | Status: DC

## 2016-01-18 NOTE — Telephone Encounter (Signed)
Pt presented to ER yesterday with TIA symptoms. Provider felt they were migraine related. Started pt on Depakote. Patient would like to know if there is something else she can take. Feels depakote is too strong, and has too many side effects. Please advise.   "They recommend treatment of headache with Depakote and discharged home in headache has improved with outpatient neurology follow-up. Patient treated with Depakote with significant resolution of headache. She feels comfortable with this plan. Repeat neurologic exam again completely within normal limits. Patient discharged with standard ED return precautions. - ER NOTE

## 2016-01-18 NOTE — Telephone Encounter (Signed)
Message relayed to patient. Verbalized understanding and denied questions.   

## 2016-01-18 NOTE — Telephone Encounter (Signed)
I think PT for the shoulder is fine.

## 2016-01-18 NOTE — Telephone Encounter (Signed)
Pt aware. Would prefer venlafaxine. RX sent in. Pt would like to know if it is still okay for her to proceed with physical therapy?

## 2016-01-18 NOTE — Telephone Encounter (Signed)
I agree that these episodes are migraine.  Instead of Depakote, we can try venlafaxine ER 37.5mg  daily for 7 days, then increase to 75mg  daily.

## 2016-01-18 NOTE — Telephone Encounter (Signed)
Pt would like to talk to someone about some medication that the er gave her when she had a TIA yesterday please call [phone]

## 2016-03-03 DIAGNOSIS — J449 Chronic obstructive pulmonary disease, unspecified: Secondary | ICD-10-CM | POA: Insufficient documentation

## 2016-03-29 IMAGING — MR MR LUMBAR SPINE W/O CM
4 of 5 series · 17 of 48 positions shown · non-contrast
Comparison: MRI cervical spine 12/18/2012.

CLINICAL DATA: Low back pain increasing over the past year.

EXAM:
MRI LUMBAR SPINE WITHOUT CONTRAST
TECHNIQUE: Multiplanar, multisequence MR imaging of the lumbar spine was
performed. No intravenous contrast was administered.

[Series 3: T1 · sagittal · 4.0mm · 0.84mm/px · 3 of 14 slices shown (1 of 2)]
[im 3/14]
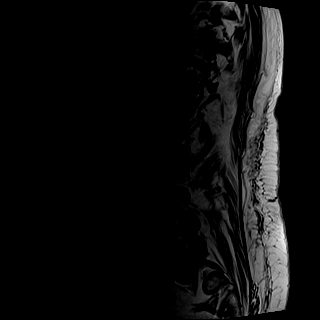
[im 8/14]
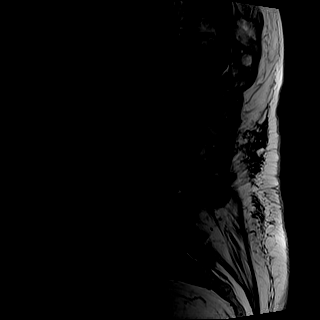
[im 14/14]
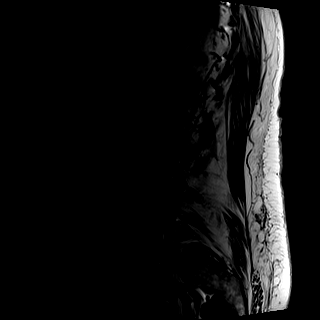

[Series 4: T2 · sagittal · 4.0mm · 0.42mm/px · 6 of 14 slices shown (1 of 2)]
[im 1/14]
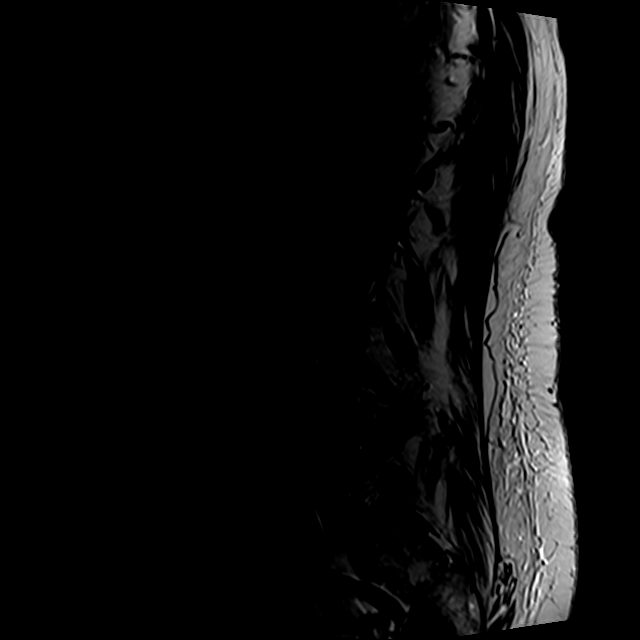
[im 3/14]
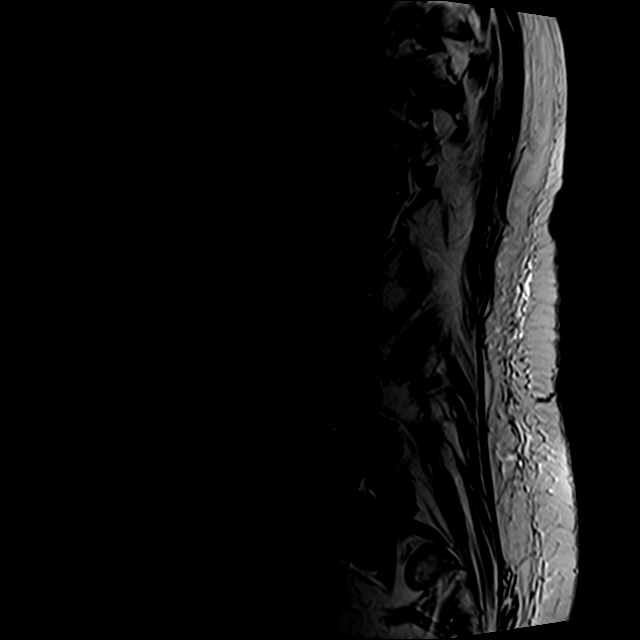
[im 6/14]
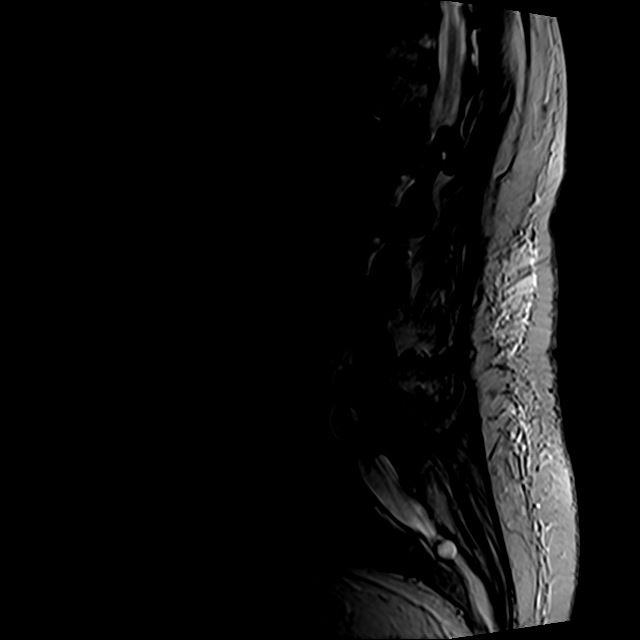
[im 8/14]
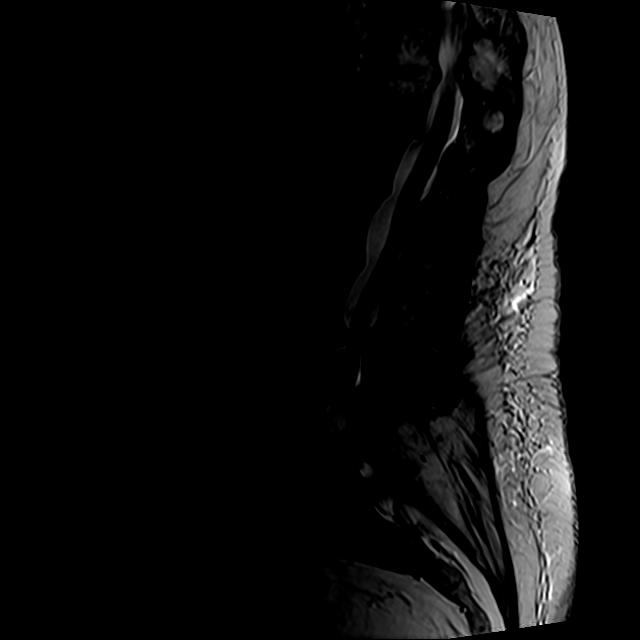
[im 11/14]
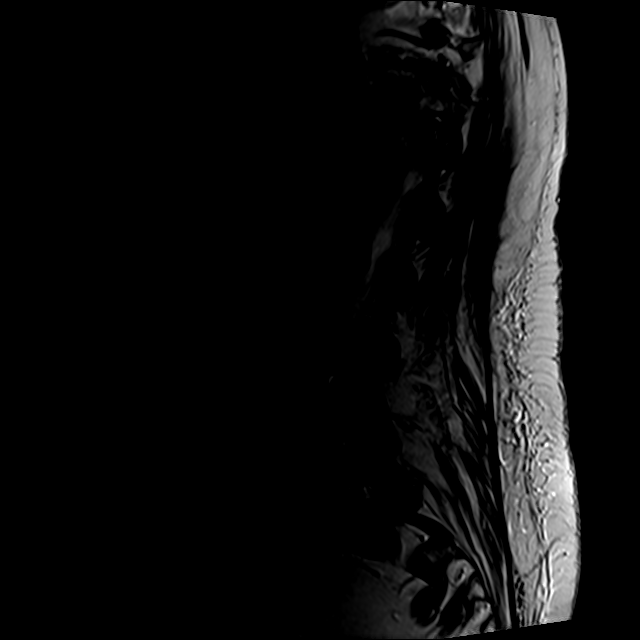
[im 14/14]
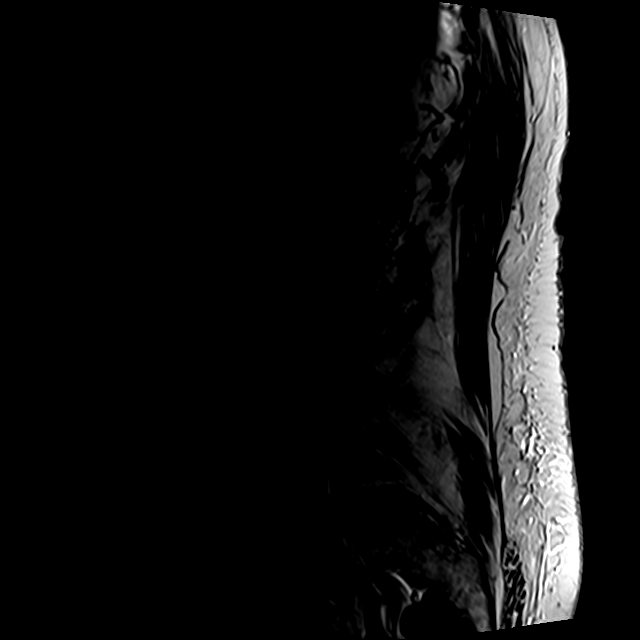

[Series 5: T2 · axial · 4.0mm · 0.37mm/px · z∈[-78,+99]mm · 5 of 38 slices shown (2 of 2)]
[im 1/38]
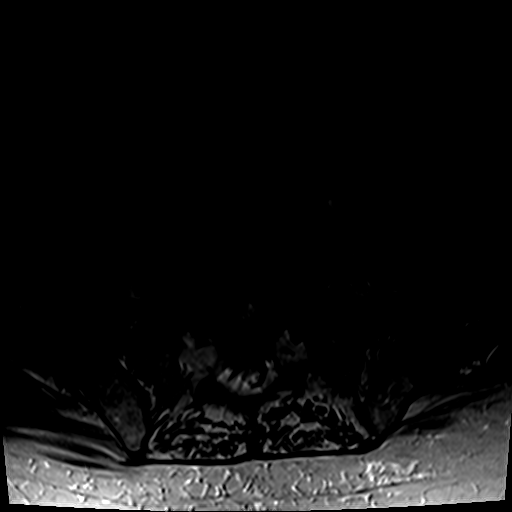
[im 6/38]
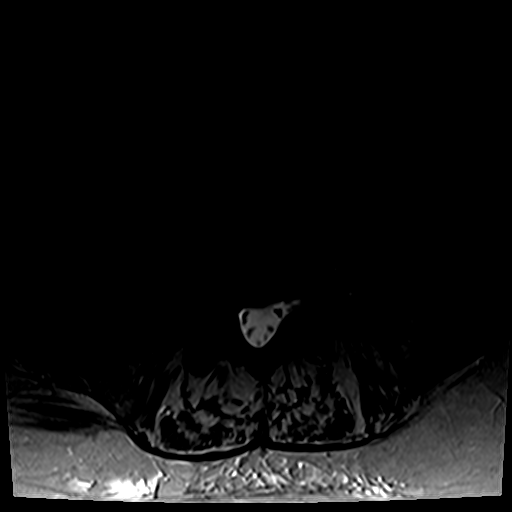
[im 11/38]
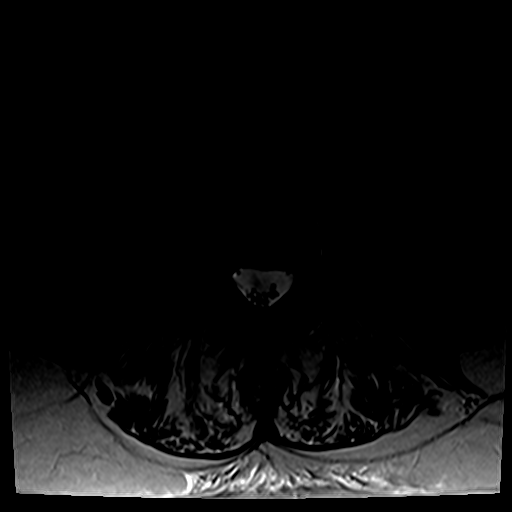
[im 19/38]
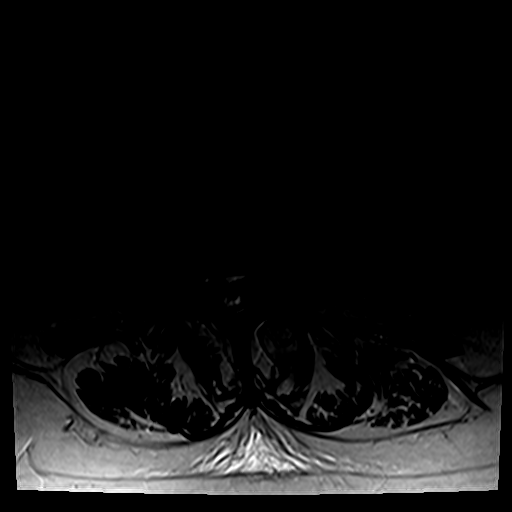
[im 32/38]
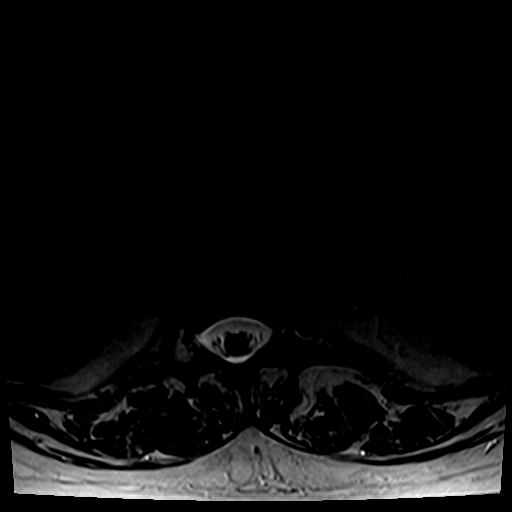

[Series 7: T1 · axial · 4.0mm · 0.37mm/px · z∈[-52,+99]mm · 3 of 38 slices shown (2 of 2)]
[im 6/38]
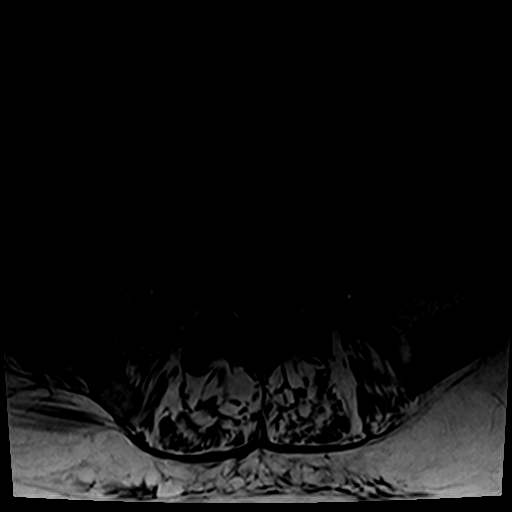
[im 19/38]
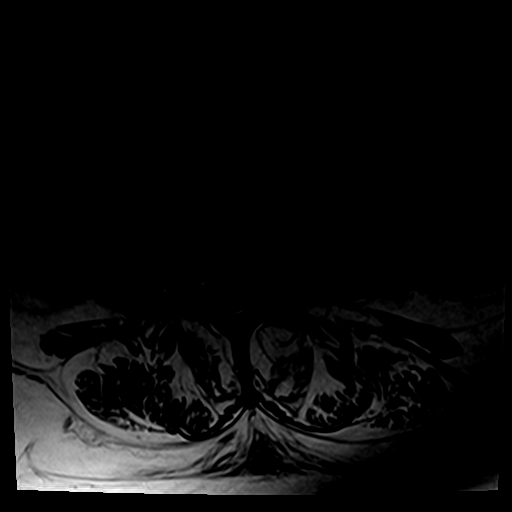
[im 32/38]
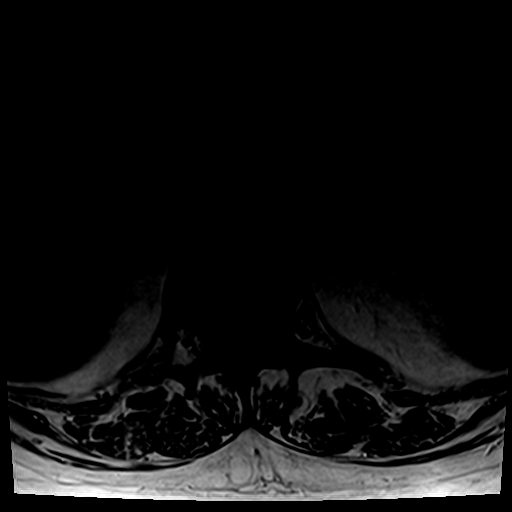

[17 of 48 positions shown; findings below may reference images not displayed]

FINDINGS: There are multiple areas of ankylosis observed in the visualized
portions of the thoracic and lumbar spine. There is ankylosis
between the T9, T10, and T11 vertebral bodies. There is ankylosis
between the L4 and L5 vertebral bodies. In addition, on the cervical
MRI there is ankylosis at C5-6 and T1-2. While ankylosing
spondylitis is more prevalent in men, this may be an early
manifestation of AS in a woman.

Slight bone marrow edema at T12, without loss of vertebral body
height. This may represent a reaction to the severe disc pathology
at T11-12. There is moderately advanced T2 discal hyperintensity of
the T11-12 disc space. This likely represents severe adjacent
segment disease. In the appropriate clinical setting, diskitis can
have this appearance, but this is not strongly favored.

At T11-T12, there is asymmetric facet arthropathy on the LEFT
associated with disc osteophyte complex projecting to the into the
foramen. LEFT T11 nerve root could be compressed.

At T12-L1, the disc space bulges mildly.  There is no impingement.

At L1-2, the disc appears relatively normal. Mild facet arthropathy
is noted.

At L2-L3, there is severe disc space narrowing with a central disc
osteophyte complex. Advanced facet arthropathy and ligamentum flavum
hypertrophy result in moderate to severe spinal stenosis. BILATERAL
L2 and L3 nerve root impingement are observed.

At L3-4, the disc is severely narrowed. There is mild central disc
osteophyte complex. There is advanced facet arthropathy and
ligamentum flavum hypertrophy. Mild to moderate central canal
stenosis could affect the L3 and L4 nerve roots.

At L4-5, there is complete fusion across the interspace this has
occurred in a position of 5 mm anterolisthesis. Moderate to severe
central canal stenosis with BILATERAL RIGHT greater than LEFT L4 and
L5 nerve root impingement.

At L5-S1, there is severe disc space narrowing. There is no
impingement.
IMPRESSION: Multilevel ankylosis. Correlate clinically for ankylosing
spondylitis.

Discal hyperintensity at T11-T12 likely represents severe
degenerative change due to adjacent segment disease. There is also
bone marrow edema in the T12 vertebral body, likely reactive. Doubt
diskitis. See discussion above.

Multilevel spondylosis at L2-3, L3-4, and L4-5 with potentially
symptomatic neural impingement at these levels.

## 2016-05-12 ENCOUNTER — Encounter: Payer: Self-pay | Admitting: Neurology

## 2016-05-12 ENCOUNTER — Ambulatory Visit (INDEPENDENT_AMBULATORY_CARE_PROVIDER_SITE_OTHER): Payer: Medicare HMO | Admitting: Neurology

## 2016-05-12 VITALS — BP 144/80 | HR 74 | Ht 59.0 in | Wt 202.0 lb

## 2016-05-12 DIAGNOSIS — G43909 Migraine, unspecified, not intractable, without status migrainosus: Secondary | ICD-10-CM

## 2016-05-12 DIAGNOSIS — G43109 Migraine with aura, not intractable, without status migrainosus: Secondary | ICD-10-CM

## 2016-05-12 DIAGNOSIS — I10 Essential (primary) hypertension: Secondary | ICD-10-CM | POA: Diagnosis not present

## 2016-05-12 DIAGNOSIS — H8112 Benign paroxysmal vertigo, left ear: Secondary | ICD-10-CM | POA: Diagnosis not present

## 2016-05-12 NOTE — Progress Notes (Signed)
NEUROLOGY FOLLOW UP OFFICE NOTE  MONYETTE CLAYBURN JC:[id number]  HISTORY OF PRESENT ILLNESS: Alisha Terrell is a 75 year old right-handed female with hypertension who follows up for TIA.     UPDATE: EEG from 12/26/15 was normal.  MRI of cervical spine from 01/02/16 was personally reviewed and revealed progressive degenerative changes at C1-2 and dens without upper cord deformity.  She did see the surgeon who did not recommend any intervention.  On 01/17/16, she had a recurrent spell of right sided facial droop and numbness and tingling.  It lasted about 20 minutes.  It was similar to prior spells.  There was associated headache.  She presented to the ED.  Head CT was personally reviewed and was negative for acute abnormality.  CBC was unremarkable except for mildly elevated WBC of 12.2 and unremarkable CMP except for elevated glucose of 127.  She was treated with Depakote in the ED, with resolution of headache.  She had a couple of episodes of visual aura but no recurrent spells since end of April.  She never started the Effexor.  Of note, she reports longstanding episodic dizziness, described as brief sense of movement with head turn to left.  She also reports a roaring sound in the left ear.  No other lateralizing symptoms.     HISTORY: On 10/12/15, she developed numbness on the right lower side of her face and lips, as well as the 4th and 5th digits of her right hand.  It lasted 15 minutes and resolved.  Systolic blood pressure was in the low 200s (typically it is in the 140s).  At the time, she felt unsteady but denied dizziness, visual disturbance or focal weakness.  She was evaluated at the ED.  EKG showed sinus rhythm.  Troponins, CBC and BMP were unremarkable.  Glucose was 113.  CT of head showed no acute intracranial process.  She declined MRI due to claustrophobia.  She has been on ASA 81mg  daily for several years and has doubled the dose since this event.  2D echo from 11/19/15 demonstrated  EF 55-60% with no cardiac source of emboli.  Carotid doppler showed no hemodynamically significant ICA stenosis.  On 12/14/15, she had a recurrent episode of numbness involving the right lower side of her face and right hand, lasting a few minutes.  She went to the ED where blood pressure was 141/94.  CBC and CMP were unremarkable.  UA showed only small leukocytes but otherwise unremarkable.  EKG showed no acute changes.  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  In November, she had increase in these auras.  However, on one occasion, she developed perioral numbness and tingling for short time.  It was preceded earlier in the day by a headache.     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  PAST MEDICAL HISTORY: Past Medical History:  Diagnosis Date  . Arthritis   . Asthma   . Hypertension     MEDICATIONS: Current Outpatient Prescriptions on File Prior to Visit  Medication Sig Dispense Refill  . ADVAIR DISKUS 250-50 MCG/DOSE AEPB Inhale 1 puff into the lungs  2 (two) times daily.     Marland Kitchen amLODipine (NORVASC) 5 MG tablet Take 5 mg by mouth daily.    Marland Kitchen aspirin 325 MG tablet Take 325 mg by mouth daily.    Marland Kitchen atenolol (TENORMIN) 50 MG tablet Take 50 mg by mouth 2 (two) times daily.    . celecoxib (CELEBREX) 200 MG capsule Take 200 mg by mouth every morning.     Marland Kitchen Dextromethorphan-Guaifenesin (ROBITUSSIN COUGH/CHEST DM MAX PO) Take 1 mL by mouth 2 (two) times daily as needed (for cold symptoms).     . fexofenadine (ALLEGRA) 180 MG tablet Take 180 mg by mouth as needed.     . fluticasone (FLONASE) 50 MCG/ACT nasal spray  Place 1 spray into both nostrils at bedtime.     Marland Kitchen losartan (COZAAR) 100 MG tablet Take 100 mg by mouth daily.    . montelukast (SINGULAIR) 10 MG tablet Take 10 mg by mouth at bedtime.    . Multiple Vitamins-Minerals (CENTRUM SILVER ADULT 50+ PO) Take 1 tablet by mouth daily.    Marland Kitchen omeprazole (PRILOSEC) 40 MG capsule Take 40 mg by mouth at bedtime.     Marland Kitchen PROAIR HFA 108 (90 Base) MCG/ACT inhaler Inhale 1-2 puffs into the lungs every 6 (six) hours as needed for wheezing or shortness of breath.     . sodium chloride (OCEAN) 0.65 % SOLN nasal spray Place 1 spray into both nostrils as needed for congestion.     No current facility-administered medications on file prior to visit.     ALLERGIES: Allergies  Allergen Reactions  . Codeine Rash and Other (See Comments)    Agitation, bad dreams  . Omnicef [Cefdinir] Nausea And Vomiting and Rash    FAMILY HISTORY: No family history of seizure, stroke, brain cancer  SOCIAL HISTORY: Social History   Social History  . Marital status: Married    Spouse name: N/A  . Number of children: N/A  . Years of education: N/A   Occupational History  . Not on file.   Social History Main Topics  . Smoking status: Passive Smoke Exposure - Never Smoker    Types: Cigarettes  . Smokeless tobacco: Not on file  . Alcohol use Yes  . Drug use: No  . Sexual activity: Not on file   Other Topics Concern  . Not on file   Social History Narrative  . No narrative on file    REVIEW OF SYSTEMS: Constitutional: No fevers, chills, or sweats, no generalized fatigue, change in appetite Eyes: No visual changes, double vision, eye pain Ear, nose and throat: No hearing loss, ear pain, nasal congestion, sore throat Cardiovascular: No chest pain, palpitations Respiratory:  No shortness of breath at rest or with exertion, wheezes GastrointestinaI: No nausea, vomiting, diarrhea, abdominal pain, fecal incontinence Genitourinary:  No dysuria, urinary retention or  frequency Musculoskeletal:  No neck pain, back pain Integumentary: No rash, pruritus, skin lesions Neurological: as above Psychiatric: No depression, insomnia, anxiety Endocrine: No palpitations, fatigue, diaphoresis, mood swings, change in appetite, change in weight, increased thirst Hematologic/Lymphatic:  No purpura, petechiae. Allergic/Immunologic: no itchy/runny eyes, nasal congestion, recent allergic reactions, rashes  PHYSICAL EXAM: Vitals:   05/12/16 1050  BP: (!) 144/80  Pulse: 74   General: No acute distress.  Patient appears well-groomed.  normal body habitus. Head:  Normocephalic/atraumatic Eyes:  Fundi examined but not visualized Neck: supple, no paraspinal tenderness, full range of motion Heart:  Regular rate and rhythm Lungs:  Clear to auscultation bilaterally Back: No  paraspinal tenderness Neurological Exam: alert and oriented to person, place, and time. Attention span and concentration intact, recent and remote memory intact, fund of knowledge intact.  Speech fluent and not dysarthric, language intact.  CN II-XII intact. Bulk and tone normal, muscle strength 5/5 throughout.  Sensation to light touch intact.  Deep tendon reflexes 2+ throughout.  Finger to nose and heel to shin testing intact.  Gait normal.  IMPRESSION: Spells are now more likely to be complicated migraines.  I do not suspect TIAs.. Dizziness, sounds like positional vertigo.  However, with associated roaring sound in the ear, recommend seeing ENT. HTN.  BP borderline elevated today.  Should follow up with PCP  PLAN: Will continue to monitor. She is feeling well.  We will hold off on starting a migraine preventative. Follow up in 6 months.  25 minutes spent face to face with patient, over 50% spent counseling.  Metta Clines, DO  CC:  Juanita Craver, MD

## 2016-05-12 NOTE — Patient Instructions (Signed)
We will monitor episodes of migraine for now.  If you have a recurrent episode which is like the previous episodes, you do not need to go to the ER.  The dizziness is likely an inner ear issue, but since you hear a "roaring sound", I would recommend seeing ear, nose and throat for evaluation.

## 2016-05-12 NOTE — Progress Notes (Signed)
Chart forwarded.  

## 2016-10-13 DIAGNOSIS — G459 Transient cerebral ischemic attack, unspecified: Secondary | ICD-10-CM

## 2016-10-13 HISTORY — DX: Transient cerebral ischemic attack, unspecified: G45.9

## 2016-11-12 ENCOUNTER — Ambulatory Visit: Payer: Medicare HMO | Admitting: Neurology

## 2017-01-29 ENCOUNTER — Ambulatory Visit (INDEPENDENT_AMBULATORY_CARE_PROVIDER_SITE_OTHER): Payer: Medicare HMO | Admitting: Neurology

## 2017-01-29 ENCOUNTER — Encounter: Payer: Self-pay | Admitting: Neurology

## 2017-01-29 VITALS — BP 110/68 | HR 65 | Ht 59.0 in | Wt 198.0 lb

## 2017-01-29 DIAGNOSIS — G43109 Migraine with aura, not intractable, without status migrainosus: Secondary | ICD-10-CM

## 2017-01-29 NOTE — Progress Notes (Signed)
Note sent to Dr. Tollie Pizza.

## 2017-01-29 NOTE — Progress Notes (Signed)
NEUROLOGY FOLLOW UP OFFICE NOTE  [name] [id number]  HISTORY OF PRESENT ILLNESS: Alisha Terrell is a 76 year old right-handed female with hypertension who follows up for complicated migraines   UPDATE:  She has not had any recurrent visual auras or complicated migraines since last April.   HISTORY: On 10/12/15, she developed numbness on the right lower side of her face and lips, as well as the 4th and 5th digits of her right hand.  It lasted 15 minutes and resolved.  Systolic blood pressure was in the low 200s (typically it is in the 140s).  At the time, she felt unsteady but denied dizziness, visual disturbance or focal weakness.  She was evaluated at the ED.  EKG showed sinus rhythm.  Troponins, CBC and BMP were unremarkable.  Glucose was 113.  CT of head showed no acute intracranial process.  She declined MRI due to claustrophobia.  She has been on ASA 81mg  daily for several years and has doubled the dose since this event.  2D echo from 11/19/15 demonstrated EF 55-60% with no cardiac source of emboli.  Carotid doppler showed no hemodynamically significant ICA stenosis.   On 12/14/15, she had a recurrent episode of numbness involving the right lower side of her face and right hand, lasting a few minutes.  She went to the ED where blood pressure was 141/94.  CBC and CMP were unremarkable.  UA showed only small leukocytes but otherwise unremarkable.  EKG showed no acute changes.  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  EEG from 12/26/15 was normal.  MRI of cervical spine from 01/02/16 was personally reviewed and revealed progressive degenerative changes at C1-2 and dens without upper cord deformity.  She did see the surgeon who did not recommend any intervention.  On 01/17/16, she had a recurrent spell of right sided facial  droop and numbness and tingling.  It lasted about 20 minutes.  It was similar to prior spells.  There was associated headache.  She presented to the ED.  Head CT was personally reviewed and was negative for acute abnormality.  CBC was unremarkable except for mildly elevated WBC of 12.2 and unremarkable CMP except for elevated glucose of 127.  She was treated with Depakote in the ED, with resolution of headache.  She had a couple of episodes of visual aura but no recurrent spells since end of April.  She never started the Effexor.   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  In November, she had increase in these auras.  However, on one occasion, she developed perioral numbness and tingling for short time.  It was preceded earlier in the day by a headache.     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  Of note, she reports longstanding episodic dizziness, described as brief sense of movement with head turn to left.  She also reports a roaring sound in the left ear.  No other lateralizing symptoms.  PAST MEDICAL HISTORY: Past Medical History:  Diagnosis Date  . Arthritis   . Asthma   . Hypertension     MEDICATIONS: Current Outpatient Prescriptions on File Prior to Visit  Medication Sig Dispense  Refill  . ADVAIR DISKUS 250-50 MCG/DOSE AEPB Inhale 1 puff into the lungs 2 (two) times daily.     Marland Kitchen amLODipine (NORVASC) 5 MG tablet Take 5 mg by mouth daily.    Marland Kitchen aspirin 325 MG tablet Take 325 mg by mouth daily.    Marland Kitchen atenolol (TENORMIN) 50 MG tablet Take 50 mg by mouth 2 (two) times daily.    . celecoxib (CELEBREX) 200 MG capsule Take 200 mg by mouth every morning.     Marland Kitchen Dextromethorphan-Guaifenesin (ROBITUSSIN COUGH/CHEST DM MAX PO) Take 1 mL by mouth 2 (two) times daily as needed (for cold symptoms).     . fexofenadine  (ALLEGRA) 180 MG tablet Take 180 mg by mouth as needed.     . fluticasone (FLONASE) 50 MCG/ACT nasal spray Place 1 spray into both nostrils at bedtime.     Marland Kitchen losartan (COZAAR) 100 MG tablet Take 100 mg by mouth daily.    . montelukast (SINGULAIR) 10 MG tablet Take 10 mg by mouth at bedtime.    . Multiple Vitamins-Minerals (CENTRUM SILVER ADULT 50+ PO) Take 1 tablet by mouth daily.    Marland Kitchen omeprazole (PRILOSEC) 40 MG capsule Take 40 mg by mouth at bedtime.     Marland Kitchen PROAIR HFA 108 (90 Base) MCG/ACT inhaler Inhale 1-2 puffs into the lungs every 6 (six) hours as needed for wheezing or shortness of breath.     . sodium chloride (OCEAN) 0.65 % SOLN nasal spray Place 1 spray into both nostrils as needed for congestion.     No current facility-administered medications on file prior to visit.     ALLERGIES: Allergies  Allergen Reactions  . Codeine Rash and Other (See Comments)    Agitation, bad dreams  . Omnicef [Cefdinir] Nausea And Vomiting and Rash    FAMILY HISTORY: No family history on file.  SOCIAL HISTORY: Social History   Social History  . Marital status: Married    Spouse name: N/A  . Number of children: N/A  . Years of education: N/A   Occupational History  . Not on file.   Social History Main Topics  . Smoking status: Passive Smoke Exposure - Never Smoker    Types: Cigarettes  . Smokeless tobacco: Never Used  . Alcohol use Yes  . Drug use: No  . Sexual activity: Not on file   Other Topics Concern  . Not on file   Social History Narrative  . No narrative on file    REVIEW OF SYSTEMS: Constitutional: No fevers, chills, or sweats, no generalized fatigue, change in appetite Eyes: No visual changes, double vision, eye pain Ear, nose and throat: No hearing loss, ear pain, nasal congestion, sore throat Cardiovascular: No chest pain, palpitations Respiratory:  No shortness of breath at rest or with exertion, wheezes GastrointestinaI: No nausea, vomiting, diarrhea,  abdominal pain, fecal incontinence Genitourinary:  No dysuria, urinary retention or frequency Musculoskeletal:  Neck pain Integumentary: No rash, pruritus, skin lesions Neurological: as above Psychiatric: No depression, insomnia, anxiety Endocrine: No palpitations, fatigue, diaphoresis, mood swings, change in appetite, change in weight, increased thirst Hematologic/Lymphatic:  No purpura, petechiae. Allergic/Immunologic: no itchy/runny eyes, nasal congestion, recent allergic reactions, rashes  PHYSICAL EXAM: Vitals:   01/29/17 1056  BP: 110/68  Pulse: 65   General: No acute distress.  Patient appears well-groomed.   Head:  Normocephalic/atraumatic Eyes:  Fundi examined but not visualized Neck: supple, no paraspinal tenderness, full range of motion Heart:  Regular rate and rhythm Lungs:  Clear to  auscultation bilaterally Back: No paraspinal tenderness Neurological Exam: alert and oriented to person, place, and time. Attention span and concentration intact, recent and remote memory intact, fund of knowledge intact.  Speech fluent and not dysarthric, language intact.  CN II-XII intact. Bulk and tone normal, muscle strength 5/5 throughout.  Sensation to light touch  intact.  Deep tendon reflexes trace throughout.  Finger to nose testing intact.  Gait wobbling  IMPRESSION: Complicated migraine Ocular migrane  PLAN: Continue to monitor Follow up in one year or as needed.  16 minutes spent face to face with patient, over 50% spent discussing diagnosis and management.  Metta Clines, DO  CC: Juanita Craver, MD

## 2017-07-02 IMAGING — CR DG WRIST COMPLETE 3+V*L*
4 series · 4 of 4 positions shown · non-contrast
Comparison: No priors.

CLINICAL DATA: 74-year-old female with history of trauma from a
fall complaining of left wrist pain.

EXAM:
LEFT WRIST - COMPLETE 3+ VIEW

[wrist pa]
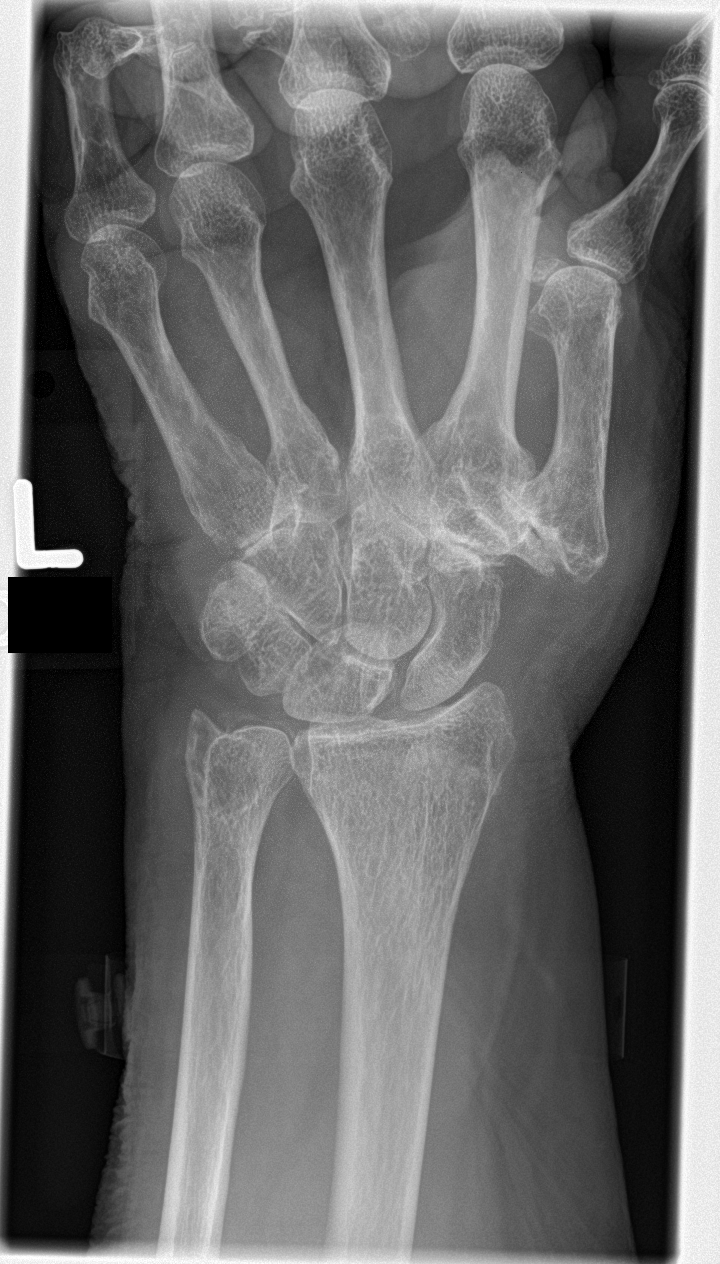

[wrist obl]
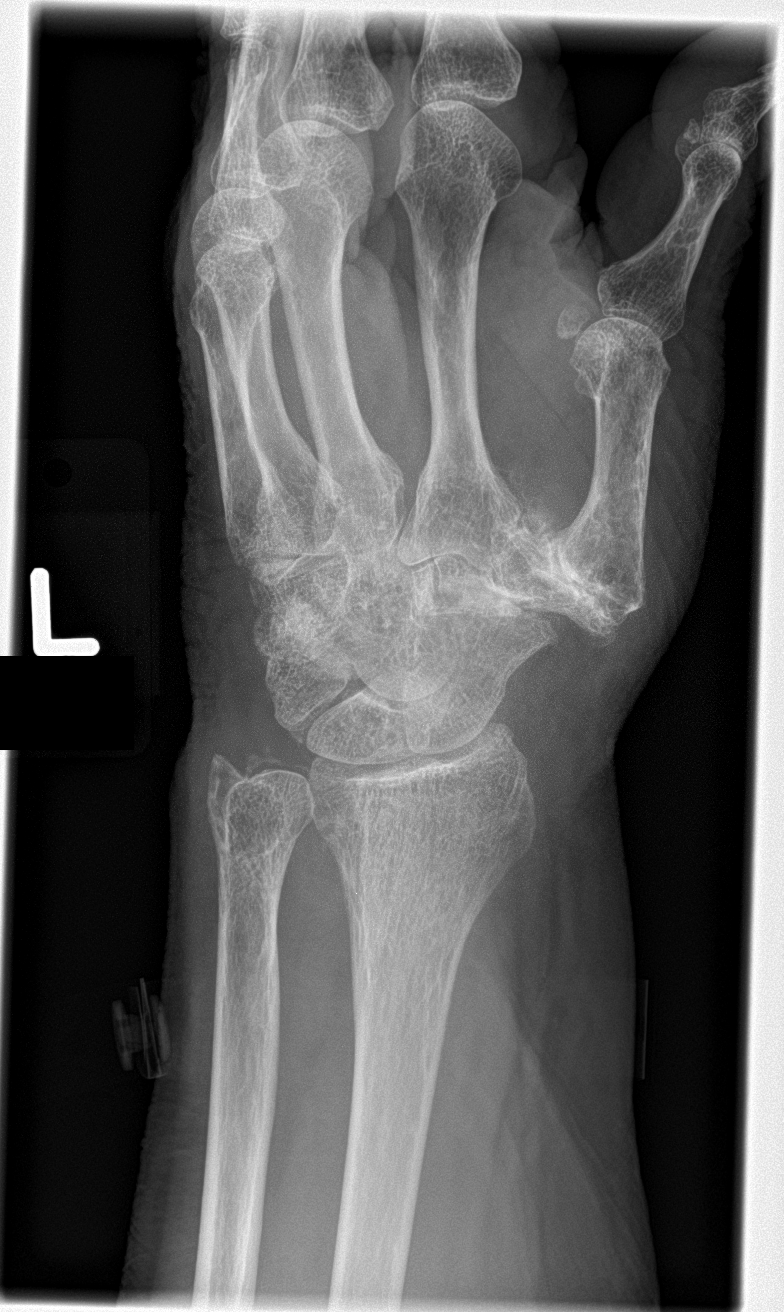

[wrist lat]
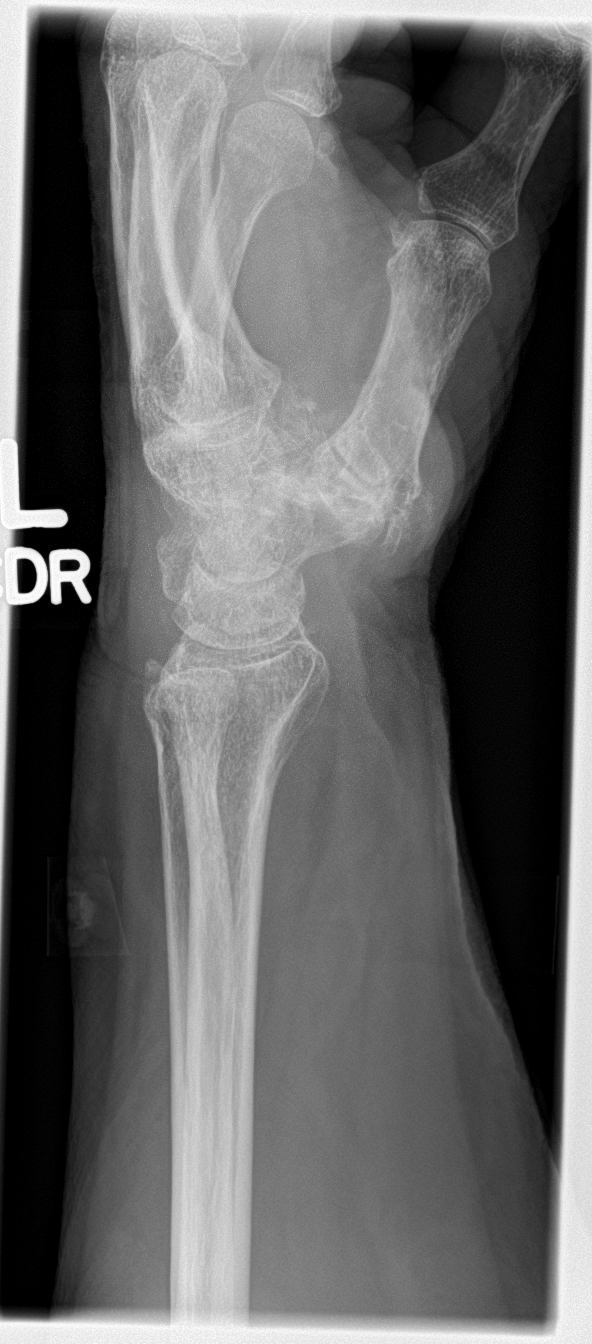

[wrist navicular]
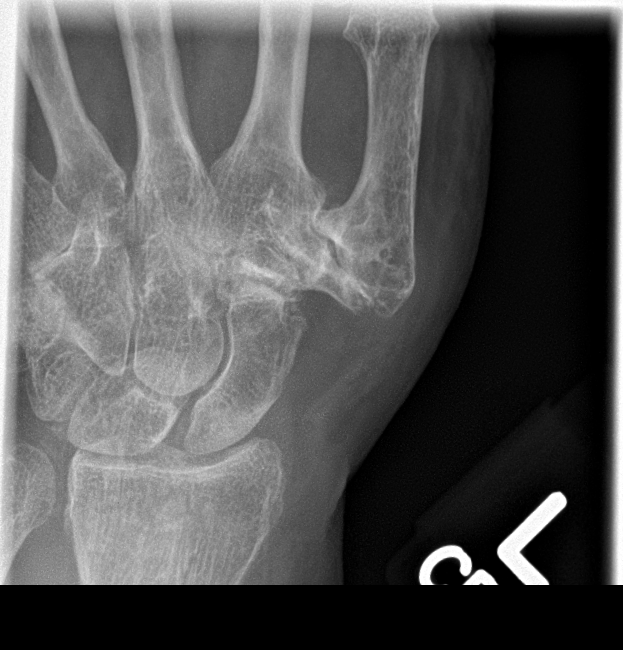

[4 of 4 positions shown; findings below may reference images not displayed]

FINDINGS: Four views of the left wrist demonstrate no acute displaced fracture
or dislocation. Severe degenerative changes are noted, most evident
at the first carpal/metacarpal joint where there is profound joint
space narrowing, subchondral sclerosis, subchondral cyst formation,
osteophyte formation and lateral subluxation, compatible with
advanced osteoarthritis. Calcifications in the triangular
fibrocartilage complex.
IMPRESSION: 1. Soft tissue swelling around the left wrist without acute
displaced fracture.
2. Advanced degenerative changes in the left wrist, as above,
compatible with severe osteoarthritis.
3. Chondrocalcinosis.

## 2017-07-29 DIAGNOSIS — Z0000 Encounter for general adult medical examination without abnormal findings: Secondary | ICD-10-CM | POA: Insufficient documentation

## 2018-01-16 ENCOUNTER — Emergency Department (HOSPITAL_COMMUNITY)
Admission: EM | Admit: 2018-01-16 | Discharge: 2018-01-16 | Disposition: A | Discharge status: Home / Self Care | Payer: Medicare HMO | Attending: Emergency Medicine | Admitting: Emergency Medicine

## 2018-01-16 ENCOUNTER — Encounter (HOSPITAL_COMMUNITY): Payer: Self-pay

## 2018-01-16 ENCOUNTER — Emergency Department (HOSPITAL_COMMUNITY): Payer: Medicare HMO

## 2018-01-16 DIAGNOSIS — Z79899 Other long term (current) drug therapy: Secondary | ICD-10-CM | POA: Insufficient documentation

## 2018-01-16 DIAGNOSIS — J45909 Unspecified asthma, uncomplicated: Secondary | ICD-10-CM | POA: Diagnosis not present

## 2018-01-16 DIAGNOSIS — S0083XA Contusion of other part of head, initial encounter: Secondary | ICD-10-CM | POA: Insufficient documentation

## 2018-01-16 DIAGNOSIS — W19XXXA Unspecified fall, initial encounter: Secondary | ICD-10-CM

## 2018-01-16 DIAGNOSIS — E041 Nontoxic single thyroid nodule: Secondary | ICD-10-CM | POA: Diagnosis not present

## 2018-01-16 DIAGNOSIS — S0990XA Unspecified injury of head, initial encounter: Secondary | ICD-10-CM | POA: Diagnosis present

## 2018-01-16 DIAGNOSIS — W108XXA Fall (on) (from) other stairs and steps, initial encounter: Secondary | ICD-10-CM | POA: Insufficient documentation

## 2018-01-16 DIAGNOSIS — Y9301 Activity, walking, marching and hiking: Secondary | ICD-10-CM | POA: Insufficient documentation

## 2018-01-16 DIAGNOSIS — Z7982 Long term (current) use of aspirin: Secondary | ICD-10-CM | POA: Insufficient documentation

## 2018-01-16 DIAGNOSIS — Z7722 Contact with and (suspected) exposure to environmental tobacco smoke (acute) (chronic): Secondary | ICD-10-CM | POA: Diagnosis not present

## 2018-01-16 DIAGNOSIS — I10 Essential (primary) hypertension: Secondary | ICD-10-CM | POA: Insufficient documentation

## 2018-01-16 DIAGNOSIS — S8001XA Contusion of right knee, initial encounter: Secondary | ICD-10-CM | POA: Insufficient documentation

## 2018-01-16 DIAGNOSIS — Y92008 Other place in unspecified non-institutional (private) residence as the place of occurrence of the external cause: Secondary | ICD-10-CM | POA: Diagnosis not present

## 2018-01-16 DIAGNOSIS — Y998 Other external cause status: Secondary | ICD-10-CM | POA: Diagnosis not present

## 2018-01-16 NOTE — ED Triage Notes (Signed)
To room via EMS.  Onset 30 minutes PTA pt was walking down 2 steps and fell.  Swelling, bruising to left eye, abrasions to left arm, abrasion/bruising to right knee, abrasions/pain to left wrist.  Pt stood at scene but right knee could not support pt.

## 2018-01-16 NOTE — Discharge Instructions (Signed)
You have a nodule on the right side of your thyroid gland. Please follow-up with your family doctor for recheck.

## 2018-01-16 NOTE — ED Notes (Signed)
Pt ambulated with little difficulty. Pt needed one person assist while ambulating. Pt walks with a limp due to knee hurting.

## 2018-01-16 NOTE — ED Provider Notes (Signed)
[facility] Provider Note   CSN: [id number] Arrival date & time: 01/16/18  1150     History   Chief Complaint Chief Complaint  Patient presents with  . Fall  . Abrasion  . Knee Injury  . Wrist Injury  . Facial Swelling    HPI Alisha Terrell is a 77 y.o. female.  The history is provided by the patient. No language interpreter was used.  Fall   Wrist Injury      Alisha Terrell is a 77 y.o. female who presents to the Emergency Department complaining of fall. He presents to the emergency department via EMS for evaluation of injuries following a fall that occurred at 10 AM this morning. She was helping at her daughter's house and taking out the recycling when she slipped down two steps. She struck her head, left wrist and right knee on the ground. No loss of consciousness. She did require assistance to get up. She takes a baby aspirin daily. She denies any head pain. She has chronic neck pain that is at her baseline. She does have pain in the right knee with weight-bearing as well as pain in the left wrist with range of motion. No additional symptoms.  Past Medical History:  Diagnosis Date  . Arthritis   . Asthma   . Hypertension     Patient Active Problem List   Diagnosis Date Noted  . 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 TIA (transient ischemic attack) 11/13/2015    Past Surgical History:  Procedure Laterality Date  . ABDOMINAL SURGERY    . CESAREAN SECTION    . CHOLECYSTECTOMY       OB History   None      Home Medications    Prior to Admission medications   Medication Sig Start Date End Date Taking? Authorizing Provider  ADVAIR DISKUS 250-50 MCG/DOSE AEPB Inhale 1 puff into the lungs 2 (two) times daily.  08/22/15   [provider]  amLODipine (NORVASC) 5 MG tablet Take 5 mg by mouth daily. 07/21/15   [provider]  aspirin 325 MG tablet Take 325 mg by mouth daily.    [provider]  atenolol (TENORMIN) 50 MG tablet Take 50 mg by mouth 2 (two) times daily. 09/10/15   [provider]  azelastine (ASTELIN) 0.1 % nasal spray Place into the nose. 07/31/16   [provider]  celecoxib (CELEBREX) 200 MG capsule Take 200 mg by mouth every morning.  09/10/15   [provider]  Dextromethorphan-Guaifenesin (ROBITUSSIN COUGH/CHEST DM MAX PO) Take 1 mL by mouth 2 (two) times daily as needed (for cold symptoms).     [provider]  fexofenadine (ALLEGRA) 180 MG tablet Take 180 mg by mouth as needed.     [provider]  fluticasone (FLONASE) 50 MCG/ACT nasal spray Place 1 spray into both nostrils at bedtime.  09/26/15   [provider]  losartan (COZAAR) 100 MG tablet Take 100 mg by mouth daily. 07/21/15   [provider]  montelukast (SINGULAIR) 10 MG tablet Take 10 mg by mouth at bedtime. 09/10/15   [provider]  Multiple Vitamins-Minerals (CENTRUM SILVER ADULT 50+ PO) Take 1 tablet by mouth daily.    [provider]  omeprazole (PRILOSEC) 40 MG capsule Take 40 mg by mouth at bedtime.  09/10/15   [provider]  PROAIR HFA 108 (90 Base) MCG/ACT inhaler Inhale 1-2 puffs into the lungs every 6 (  six) hours as needed for wheezing or shortness of breath.  10/05/15   [provider]  sodium chloride (OCEAN) 0.65 % SOLN nasal spray Place 1 spray into both nostrils as needed for congestion.    [provider]    Family History History reviewed. No pertinent family history.  Social History Social History   Tobacco Use  . Smoking status: Passive Smoke Exposure - Never Smoker  . Smokeless tobacco: Never Used  Substance Use Topics  . Alcohol use: Yes    Alcohol/week: 1.8 oz    Types: 3 Glasses of wine per week  . Drug use: No     Allergies   Codeine and Omnicef [cefdinir]   Review of Systems Review of Systems  All other systems reviewed and are  negative.    Physical Exam Updated Vital Signs BP (!) 152/109   Pulse 77   Temp 98.2 F (36.8 C) (Oral)   Resp 16   SpO2 100%   Physical Exam  Constitutional: She is oriented to person, place, and time. She appears well-developed and well-nourished.  HENT:  Head: Normocephalic.  Eyes: Pupils are equal, round, and reactive to light. EOM are normal.  Left periorbital ecchymosis  Cardiovascular: Normal rate and regular rhythm.  No murmur heard. Pulmonary/Chest: Effort normal and breath sounds normal. No respiratory distress.  Abdominal: Soft. There is no tenderness. There is no rebound and no guarding.  Musculoskeletal:  2+ DP pulses in the right lower extremity. There is ecchymosis over the right anterior knee with no significant tenderness to the knee. Flexion extension intact in the knee. There is an abrasion over the left dorsal wrist. There is no significant tenderness to palpation over the wrist but there is pain with flexion extension at the wrist.  Neurological: She is alert and oriented to person, place, and time.  Five out of five strength in all four extremities.  Skin: Skin is warm and dry.  Psychiatric: She has a normal mood and affect. Her behavior is normal.  Nursing note and vitals reviewed.    ED Treatments / Results  Labs (all labs ordered are listed, but only abnormal results are displayed) Labs Reviewed - No data to display  EKG None  Radiology Dg Wrist Complete Left  Result Date: 01/16/2018 CLINICAL DATA:  Pt was walking down 2 steps and fell PTA. Swelling, bruising to left eye, abrasions to left arm, abrasion/bruising to right knee, abrasions/pain to left wrist. Pt stood at scene but right knee could not support pt. Pt landed on her left hand and right knee. EXAM: LEFT WRIST - COMPLETE 3+ VIEW COMPARISON:  09/13/2015 FINDINGS: No acute fracture. Deformity of the distal radius is consistent with an old, healed fracture. There is flattening of the trapezium  with joint space narrowing, subchondral sclerosis and small marginal osteophytes at the trapezium first metacarpal articulation, consistent with osteoarthritis, stable from prior exam. No dislocation. Bones are diffusely demineralized. Soft tissues are unremarkable. IMPRESSION: No acute fracture or dislocation. Electronically Signed   By: Lajean Manes M.D.   On: 01/16/2018 13:58   Ct Head Wo Contrast  Result Date: 01/16/2018 CLINICAL DATA:  77 year old who fell while walking down 2 steps earlier today. Swelling and bruising to the LEFT eye. No loss of consciousness. Initial encounter. EXAM: CT HEAD WITHOUT CONTRAST CT CERVICAL SPINE WITHOUT CONTRAST TECHNIQUE: Multidetector CT imaging of the head and cervical spine was performed following the standard protocol without intravenous contrast. Multiplanar CT image reconstructions of the cervical spine  were also generated. COMPARISON:  CT head 01/17/2016, 12/14/2015 and earlier. Cervical spine MRI 01/02/2016, 12/18/2012. MRI brain 12/14/2015. FINDINGS: CT HEAD FINDINGS Brain: Moderate cortical atrophy and mild to moderate deep atrophy, unchanged. No mass lesion. No midline shift. No acute hemorrhage or hematoma. No extra-axial fluid collections. No evidence of acute infarction. 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 No hyperdense vessel. Skull: No skull fracture or other focal osseous abnormality involving the skull. Sinuses/Orbits: Visualized paranasal sinuses, bilateral mastoid air cells and bilateral middle ear cavities well-aerated. Large LEFT periorbital subcutaneous hematoma extending into the LEFT frontal scalp. No evidence of intraorbital hemorrhage. Normal-appearing RIGHT orbit. Other: None. CT CERVICAL SPINE FINDINGS Alignment: Grade 1 spondylolisthesis of C4 on C5 measuring approximately 3 mm, unchanged dating back to the 2014 MRI. Skull base and vertebrae: No fractures identified involving the cervical spine. No  evidence of perched facets. Severe degenerative changes with partially calcified pannus at the C1-C2 articulation. Since the MRI in 2017, the patient has developed mild basilar invagination. Soft tissues and spinal canal: No evidence of paraspinous or spinal canal hematoma. No evidence of spinal stenosis. At its narrowest, the cervical canal measures 12 mm at the C4-5 level. Disc levels: Chronic fusion of the C5 and C6 vertebral bodies and the LEFT C5-6 facet joint. Chronic fusion of the T1 and T2 vertebral bodies and the BILATERAL T1-T2 facet joints. Partial fusion of the C4 and C5 POSTERIOR vertebral bodies which has progressed since the 2017 MRI and fusion of the RIGHT C4-5 facet joint. Severe disc space narrowing at C3-4. Moderate to severe disc space narrowing at C6-7 and C7-T1. Coronal reformatted images demonstrate intact LATERAL masses throughout. Combination of facet and uncinate hypertrophy account for multilevel foraminal stenoses including: Severe BILATERAL C3-4, moderate BILATERAL C4-5, mild LEFT C5-6, mild RIGHT C6-7. Upper chest: Approximate 1.5 cm nodule involving the RIGHT side of the thyroid isthmus. No other visible thyroid nodules. Calcified tracheobronchial cartilages. Visualized SUPERIOR mediastinum otherwise unremarkable. Visualized lung apices clear. Other: Mild atherosclerosis involving the carotid bulbs bilaterally. IMPRESSION: CT Head: 1. No acute intracranial abnormality. 2. Large hematoma involving the subcutaneous tissues adjacent to the LEFT orbit extending into the LEFT frontal scalp without underlying orbital fracture or skull fracture. No evidence of intraorbital hemorrhage. 3. Stable moderate generalized atrophy. CT Cervical Spine: 1. No cervical spine fractures identified. 2. Severe diffuse degenerative changes as detailed above. 3. 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 Partially calcified pannus at the C1-C2  articulation has been present on prior MRIs. 4. 1.5 cm nodule involving the RIGHT side of the isthmus of the thyroid gland, not imaged on the prior cervical spine MRIs. Non emergent thyroid ultrasound is recommended in further evaluation. This follows ACR consensus guidelines: Managing Incidental Thyroid Nodules Detected on Imaging: White Paper of the ACR Incidental Thyroid Findings Committee. J Am Coll Radiol 2015; 12:143-150. Electronically Signed   By: Evangeline Dakin M.D.   On: 01/16/2018 13:40   Ct Cervical Spine Wo Contrast  Result Date: 01/16/2018 CLINICAL DATA:  77 year old who fell while walking down 2 steps earlier today. Swelling and bruising to the LEFT eye. No loss of consciousness. Initial encounter. EXAM: CT HEAD WITHOUT CONTRAST CT CERVICAL SPINE WITHOUT CONTRAST TECHNIQUE: Multidetector CT imaging of the head and cervical spine was performed following the standard protocol without intravenous contrast. Multiplanar CT image reconstructions of the cervical spine were also generated. COMPARISON:  CT head 01/17/2016, 12/14/2015 and earlier. Cervical spine MRI  01/02/2016, 12/18/2012. MRI brain 12/14/2015. FINDINGS: CT HEAD FINDINGS Brain: Moderate cortical atrophy and mild to moderate deep atrophy, unchanged. No mass lesion. No midline shift. No acute hemorrhage or hematoma. No extra-axial fluid collections. No evidence of acute infarction. 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 No hyperdense vessel. Skull: No skull fracture or other focal osseous abnormality involving the skull. Sinuses/Orbits: Visualized paranasal sinuses, bilateral mastoid air cells and bilateral middle ear cavities well-aerated. Large LEFT periorbital subcutaneous hematoma extending into the LEFT frontal scalp. No evidence of intraorbital hemorrhage. Normal-appearing RIGHT orbit. Other: None. CT CERVICAL SPINE FINDINGS Alignment: Grade 1 spondylolisthesis of C4 on C5 measuring approximately  3 mm, unchanged dating back to the 2014 MRI. Skull base and vertebrae: No fractures identified involving the cervical spine. No evidence of perched facets. Severe degenerative changes with partially calcified pannus at the C1-C2 articulation. Since the MRI in 2017, the patient has developed mild basilar invagination. Soft tissues and spinal canal: No evidence of paraspinous or spinal canal hematoma. No evidence of spinal stenosis. At its narrowest, the cervical canal measures 12 mm at the C4-5 level. Disc levels: Chronic fusion of the C5 and C6 vertebral bodies and the LEFT C5-6 facet joint. Chronic fusion of the T1 and T2 vertebral bodies and the BILATERAL T1-T2 facet joints. Partial fusion of the C4 and C5 POSTERIOR vertebral bodies which has progressed since the 2017 MRI and fusion of the RIGHT C4-5 facet joint. Severe disc space narrowing at C3-4. Moderate to severe disc space narrowing at C6-7 and C7-T1. Coronal reformatted images demonstrate intact LATERAL masses throughout. Combination of facet and uncinate hypertrophy account for multilevel foraminal stenoses including: Severe BILATERAL C3-4, moderate BILATERAL C4-5, mild LEFT C5-6, mild RIGHT C6-7. Upper chest: Approximate 1.5 cm nodule involving the RIGHT side of the thyroid isthmus. No other visible thyroid nodules. Calcified tracheobronchial cartilages. Visualized SUPERIOR mediastinum otherwise unremarkable. Visualized lung apices clear. Other: Mild atherosclerosis involving the carotid bulbs bilaterally. IMPRESSION: CT Head: 1. No acute intracranial abnormality. 2. Large hematoma involving the subcutaneous tissues adjacent to the LEFT orbit extending into the LEFT frontal scalp without underlying orbital fracture or skull fracture. No evidence of intraorbital hemorrhage. 3. Stable moderate generalized atrophy. CT Cervical Spine: 1. No cervical spine fractures identified. 2. Severe diffuse degenerative changes as detailed above. 3. Development of mild  basilar invagination due to severe degenerative changes at the C1-C2 articulation. Partially calcified pannus at the C1-C2 articulation has been present on prior MRIs. 4. 1.5 cm nodule involving the RIGHT side of the isthmus of the thyroid gland, not imaged on the prior cervical spine MRIs. Non emergent thyroid ultrasound is recommended in further evaluation. This follows ACR consensus guidelines: Managing Incidental Thyroid Nodules Detected on Imaging: White Paper of the ACR Incidental Thyroid Findings Committee. J Am Coll Radiol 2015; 12:143-150. Electronically Signed   By: Evangeline Dakin M.D.   On: 01/16/2018 13:40   Dg Knee Complete 4 Views Right  Result Date: 01/16/2018 CLINICAL DATA:  Pt was walking down 2 steps and fell PTA. Swelling, bruising to left eye, abrasions to left arm, abrasion/bruising to right knee, abrasions/pain to left wrist. Pt stood at scene but right knee could not support pt. Pt landed on her left hand and right knee. EXAM: RIGHT KNEE - COMPLETE 4+ VIEW COMPARISON:  None. FINDINGS: No fracture.  No bone lesion. The knee joint is normally spaced and aligned. No arthropathic changes. No joint effusion. Bones are demineralized. Soft tissues are unremarkable. IMPRESSION: No fracture  or joint abnormality. Electronically Signed   By: Lajean Manes M.D.   On: 01/16/2018 13:59    Procedures Procedures (including critical care time)  Medications Ordered in ED Medications - No data to display   Initial Impression / Assessment and Plan / ED Course  I have reviewed the triage vital signs and the nursing notes.  Pertinent labs & imaging results that were available during my care of the patient were reviewed by me and considered in my medical decision making (see chart for details).     Patient here for evaluation of injuries following a mechanical fall. She has swelling and ecchymosis to the left. Orbital region, left wrist and right knee. She is able to arrange the wrist and knee  without difficulty. No evidence of acute intracranial hemorrhage on CT head. CT C-spine stable compared to priors. Playing films with no evidence of fracture. She is able to ambulate in the department. Discussed with patient and husband home care for contusions, fall. Discussed outpatient follow-up as well as return precautions. CT does demonstrate a thyroid nodule, present on prior imaging. Discussed with patient PCP follow-up for further evaluation.  Final Clinical Impressions(s) / ED Diagnoses   Final diagnoses:  Fall, initial encounter  Contusion of right knee, initial encounter  Contusion of face, initial encounter  Thyroid nodule    ED Discharge Orders    None       Quintella Reichert, MD 01/16/18 1432

## 2018-01-29 ENCOUNTER — Ambulatory Visit: Payer: Medicare HMO | Admitting: Neurology

## 2018-02-05 ENCOUNTER — Ambulatory Visit: Payer: Medicare HMO | Admitting: Neurology

## 2018-02-05 ENCOUNTER — Encounter: Payer: Self-pay | Admitting: Neurology

## 2018-02-05 VITALS — BP 144/82 | HR 74 | Ht 59.0 in | Wt 184.0 lb

## 2018-02-05 DIAGNOSIS — G43109 Migraine with aura, not intractable, without status migrainosus: Secondary | ICD-10-CM

## 2018-02-05 DIAGNOSIS — I10 Essential (primary) hypertension: Secondary | ICD-10-CM

## 2018-02-05 DIAGNOSIS — H8112 Benign paroxysmal vertigo, left ear: Secondary | ICD-10-CM

## 2018-02-05 NOTE — Progress Notes (Signed)
NEUROLOGY FOLLOW UP OFFICE NOTE  Alisha Terrell [id number]  HISTORY OF PRESENT ILLNESS: Alisha Terrell is a 77 year old right-handed female with hypertension who follows up for complicated migraines   UPDATE:  She has not had any recurrent visual auras or complicated migraines since April 2017.  She fell on 01/16/18, slipping on the concrete steps without holding on to the rail.  She struck the left side of her temple.  CT of head was personally reviewed and demonstrated no acute findings.  She still gets the brief vertigo when she turns her head to the left, such as while driving.  It only lasts 5 to 10 seconds.  No other associated symptoms.    About 6 months ago, she had an eye appointment and was told there was something concerning on her exam and wanted me to be aware of it.  She had a recheck recently and wasn't told there was anything wrong.   HISTORY: On 10/12/15, she developed numbness on the right lower side of her face and lips, as well as the 4th and 5th digits of her right hand.  It lasted 15 minutes and resolved.  Systolic blood pressure was in the low 200s (typically it is in the 140s).  At the time, she felt unsteady but denied dizziness, visual disturbance or focal weakness.  She was evaluated at the ED.  CT of head showed no acute intracranial process.  She declined MRI due to claustrophobia.  She has been on ASA 81mg  daily for several years and has doubled the dose since this event.  2D echo from 11/19/15 demonstrated EF 55-60% with no cardiac source of emboli.  Carotid doppler showed no hemodynamically significant ICA stenosis.   On 12/14/15, she had a recurrent episode of numbness involving the right lower side of her face and right hand, lasting a few minutes.  She went to the ED where blood pressure was 141/94.  MRI of brain was normal but did show old nondisplaced base of dens C2 fracture causing basilar invagination and deforming the ventral medulla and effacing the  cerebral spinal fluid space at the foramen magnum.  EEG from 12/26/15 was normal.  MRI of cervical spine from 01/02/16 revealed progressive degenerative changes at C1-2 and dens without upper cord deformity.  She did see the surgeon who did not recommend any intervention.   On 01/17/16, she had a recurrent spell of right sided facial droop and numbness and tingling.  It lasted about 20 minutes.  It was similar to prior spells.  There was associated headache.  She presented to the ED.  Head CT was personally reviewed and was negative for acute abnormality.  She was treated with Depakote in the ED, with resolution of headache.  She had a couple of episodes of visual aura but no recurrent spells since end of April.  She never started the Effexor.   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  In November, she had increase in these auras.  However, on one occasion, she developed perioral numbness and tingling for short time.  It was preceded earlier in the day by a headache.     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   Of note, she reports longstanding episodic dizziness, described as brief sense of movement with head turn to left.  She also reports a roaring sound in the left ear.  No other lateralizing symptoms.  PAST MEDICAL HISTORY: Past Medical History:  Diagnosis Date  . Arthritis   . Asthma   . Hypertension     MEDICATIONS: Current Outpatient Medications on File Prior to Visit  Medication Sig Dispense Refill  . ADVAIR DISKUS 250-50 MCG/DOSE AEPB Inhale 1 puff into the lungs 2 (two) times daily.     Marland Kitchen amLODipine (NORVASC) 5 MG tablet Take 5 mg by mouth daily.    Marland Kitchen aspirin 325 MG tablet Take 325 mg by mouth daily.    Marland Kitchen atenolol (TENORMIN) 50 MG tablet Take 50 mg by mouth 2 (two) times daily.    Marland Kitchen azelastine (ASTELIN)  0.1 % nasal spray Place into the nose.    . celecoxib (CELEBREX) 200 MG capsule Take 200 mg by mouth every morning.     Marland Kitchen Dextromethorphan-Guaifenesin (ROBITUSSIN COUGH/CHEST DM MAX PO) Take 1 mL by mouth 2 (two) times daily as needed (for cold symptoms).     . fexofenadine (ALLEGRA) 180 MG tablet Take 180 mg by mouth as needed.     . fluticasone (FLONASE) 50 MCG/ACT nasal spray Place 1 spray into both nostrils at bedtime.     Marland Kitchen losartan (COZAAR) 100 MG tablet Take 100 mg by mouth daily.    . montelukast (SINGULAIR) 10 MG tablet Take 10 mg by mouth at bedtime.    . Multiple Vitamins-Minerals (CENTRUM SILVER ADULT 50+ PO) Take 1 tablet by mouth daily.    Marland Kitchen omeprazole (PRILOSEC) 40 MG capsule Take 40 mg by mouth at bedtime.     Marland Kitchen PROAIR HFA 108 (90 Base) MCG/ACT inhaler Inhale 1-2 puffs into the lungs every 6 (six) hours as needed for wheezing or shortness of breath.     . sodium chloride (OCEAN) 0.65 % SOLN nasal spray Place 1 spray into both nostrils as needed for congestion.     No current facility-administered medications on file prior to visit.     ALLERGIES: Allergies  Allergen Reactions  . Codeine Rash and Other (See Comments)    Agitation, bad dreams  . Omnicef [Cefdinir] Nausea And Vomiting and Rash    FAMILY HISTORY: History reviewed. No pertinent family history.  SOCIAL HISTORY: Social History   Socioeconomic History  . Marital status: Married    Spouse name: Not on file  . Number of children: Not on file  . Years of education: Not on file  . Highest education level: Not on file  Occupational History  . Not on file  Social Needs  . Financial resource strain: Not on file  . Food insecurity:    Worry: Not on file    Inability: Not on file  . Transportation needs:    Medical: Not on file    Non-medical: Not on file  Tobacco Use  . Smoking status: Passive Smoke Exposure - Never Smoker  . Smokeless tobacco: Never Used  Substance and Sexual Activity  . Alcohol use:  Yes    Alcohol/week: 1.8 oz    Types: 3 Glasses of wine per week  . Drug use: No  . Sexual activity: Not on file  Lifestyle  . Physical activity:    Days per week: Not on file    Minutes per session: Not on file  . Stress: Not on file  Relationships  . Social connections:    Talks on phone: Not on file    Gets together: Not on file  Attends religious service: Not on file    Active member of club or organization: Not on file    Attends meetings of clubs or organizations: Not on file    Relationship status: Not on file  . Intimate partner violence:    Fear of current or ex partner: Not on file    Emotionally abused: Not on file    Physically abused: Not on file    Forced sexual activity: Not on file  Other Topics Concern  . Not on file  Social History Narrative  . Not on file    REVIEW OF SYSTEMS: Constitutional: No fevers, chills, or sweats, no generalized fatigue, change in appetite Eyes: No visual changes, double vision, eye pain Ear, nose and throat: No hearing loss, ear pain, nasal congestion, sore throat Cardiovascular: No chest pain, palpitations Respiratory:  No shortness of breath at rest or with exertion, wheezes GastrointestinaI: No nausea, vomiting, diarrhea, abdominal pain, fecal incontinence Genitourinary:  No dysuria, urinary retention or frequency Musculoskeletal:  No neck pain, back pain Integumentary: No rash, pruritus, skin lesions Neurological: as above Psychiatric: No depression, insomnia, anxiety Endocrine: No palpitations, fatigue, diaphoresis, mood swings, change in appetite, change in weight, increased thirst Hematologic/Lymphatic:  No purpura, petechiae. Allergic/Immunologic: no itchy/runny eyes, nasal congestion, recent allergic reactions, rashes  PHYSICAL EXAM: Vitals:   02/05/18 1123  BP: (!) 144/82  Pulse: 74  SpO2: 98%   General: No acute distress.  Patient appears well-groomed.   Head:  Normocephalic/atraumatic Eyes:  Fundi  examined but not visualized Neck: supple, no paraspinal tenderness, full range of motion Heart:  Regular rate and rhythm Lungs:  Clear to auscultation bilaterally Back: No paraspinal tenderness Neurological Exam: alert and oriented to person, place, and time. Attention span and concentration intact, recent and remote memory intact, fund of knowledge intact.  Speech fluent and not dysarthric, language intact.  CN II-XII intact. Bulk and tone normal, muscle strength 5/5 throughout.  Sensation to light touch  intact.  Deep tendon reflexes 2+ throughout.  Finger to nose testing intact.  Gait wobbling.    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  25 minutes spent face to face with patient, over 50% spent discussing management.  Metta Clines, DO  CC:  Kathryne Eriksson, MD

## 2018-02-05 NOTE — Patient Instructions (Signed)
1)  Follow up in one year.

## 2018-02-09 ENCOUNTER — Telehealth: Payer: Self-pay | Admitting: Neurology

## 2018-02-09 NOTE — Telephone Encounter (Signed)
ROI faxed to Dr. Monna Fam.

## 2018-03-05 ENCOUNTER — Telehealth: Payer: Self-pay | Admitting: Neurology

## 2018-03-05 NOTE — Telephone Encounter (Signed)
Rec'd from [facility] forwarded 5 pages to Metta Clines DO

## 2018-06-01 DIAGNOSIS — M79641 Pain in right hand: Secondary | ICD-10-CM | POA: Insufficient documentation

## 2018-06-17 DIAGNOSIS — M19049 Primary osteoarthritis, unspecified hand: Secondary | ICD-10-CM | POA: Insufficient documentation

## 2018-08-11 ENCOUNTER — Other Ambulatory Visit: Payer: Self-pay | Admitting: Family Medicine

## 2018-08-11 DIAGNOSIS — E041 Nontoxic single thyroid nodule: Secondary | ICD-10-CM

## 2018-08-11 DIAGNOSIS — M810 Age-related osteoporosis without current pathological fracture: Secondary | ICD-10-CM | POA: Insufficient documentation

## 2018-08-16 ENCOUNTER — Ambulatory Visit
Admission: RE | Admit: 2018-08-16 | Discharge: 2018-08-16 | Disposition: A | Discharge status: Home / Self Care | Payer: Medicare HMO | Source: Ambulatory Visit | Attending: Family Medicine | Admitting: Family Medicine

## 2018-08-16 DIAGNOSIS — E041 Nontoxic single thyroid nodule: Secondary | ICD-10-CM

## 2019-02-07 ENCOUNTER — Ambulatory Visit: Payer: Medicare HMO | Admitting: Neurology

## 2019-05-16 NOTE — Progress Notes (Signed)
Virtual Visit via Telephone Note The purpose of this virtual visit is to provide medical care while limiting exposure to the novel coronavirus.    Consent was obtained for phone visit:  Yes Answered questions that patient had about telehealth interaction:  Yes I discussed the limitations, risks, security and privacy concerns of performing an evaluation and management service by telephone. I also discussed with the patient that there may be a patient responsible charge related to this service. The patient expressed understanding and agreed to proceed.  Pt location: Home Physician Location: Home Name of referring provider:  Christain Sacramento, MD I connected with .Alisha Terrell at patients initiation/request on 05/18/2019 at  9:50 AM EDT by telephone and verified that I am speaking with the correct person using two identifiers.  Pt MRN:  [id number] Pt DOB:  October 27, 1940   History of Present Illness:  Alisha Terrell is a 78 year old right-handed female with hypertension who follows up for complicated migraines  UPDATE: She has had one ocular migraine in March (zigzag lines), lasting a minute.  No other spells with TIA-like symptoms.    HISTORY: On 10/12/15, she developed numbness on the right lower side of her face and lips, as well as the 4th and 5th digits of her right hand. It lasted 15 minutes and resolved. Systolic blood pressure was in the low 200s (typically it is in the 140s). At the time, she felt unsteady but denied dizziness, visual disturbance or focal weakness. She was evaluated at the ED. CT of head showed no acute intracranial process. She declined MRI due to claustrophobia. She has been on ASA 81mg  daily for several years and has doubled the dose since this event.  2D echo from 11/19/15 demonstrated EF 55-60% with no cardiac source of emboli.  Carotid doppler showed no hemodynamically significant ICA stenosis.  On 12/14/15, she had a recurrent episode of numbness  involving the right lower side of her face and right hand, lasting a few minutes.  She went to the ED where blood pressure was 141/94.  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  EEG from 12/26/15 was normal.  MRI of cervical spine from 01/02/16 revealed progressive degenerative changes at C1-2 and dens without upper cord deformity.  She did see the surgeon who did not recommend any intervention.  On 01/17/16, she had a recurrent spell of right sided facial droop and numbness and tingling.  It lasted about 20 minutes.  It was similar to prior spells.  There was associated headache.  She presented to the ED.  Head CT was personally reviewed and was negative for acute abnormality.  She was treated with Depakote in the ED, with resolution of headache.  She had a couple of episodes of visual aura but no recurrent spells since end of April.  She never started the Effexor.  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 In November, she had increase in these auras. However, on one occasion, she developed perioral numbness and tingling for short time. It was preceded earlier in the day by a headache.   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  Of note, she reports longstanding  episodic dizziness, described as brief sense of movement with head turn to left.  She also reports a roaring sound in the left ear.  No other lateralizing symptoms.    Observations/Objective:   Height 4\' 10"  (1.473 m), weight 189 lb (85.7 kg). No acute distress.  Alert and oriented.  Speech fluent and not dysarthric.  Language intact.    Assessment and Plan:   1.  Migraine with aura, without  status migrainosus, not intractable 2.  Ocular migraine  1.  Continue ASA and optimize secondary stroke prevention. 2.  Follow up in one year  Follow Up Instructions:    -I discussed the assessment and treatment plan with the patient. The patient was provided an opportunity to ask questions and all were answered. The patient agreed with the plan and demonstrated an understanding of the instructions.   The patient was advised to call back or seek an in-person evaluation if the symptoms worsen or if the condition fails to improve as anticipated.    Total Time spent in visit with the patient was:  13 minutes  Dudley Major, DO

## 2019-05-18 ENCOUNTER — Encounter: Payer: Self-pay | Admitting: Neurology

## 2019-05-18 ENCOUNTER — Telehealth (INDEPENDENT_AMBULATORY_CARE_PROVIDER_SITE_OTHER): Payer: Medicare HMO | Admitting: Neurology

## 2019-05-18 ENCOUNTER — Other Ambulatory Visit: Payer: Self-pay

## 2019-05-18 VITALS — Ht <= 58 in | Wt 189.0 lb

## 2019-05-18 DIAGNOSIS — G43109 Migraine with aura, not intractable, without status migrainosus: Secondary | ICD-10-CM | POA: Diagnosis not present

## 2019-08-29 ENCOUNTER — Other Ambulatory Visit: Payer: Self-pay

## 2019-08-29 ENCOUNTER — Encounter: Payer: Self-pay | Admitting: Podiatry

## 2019-08-29 ENCOUNTER — Ambulatory Visit: Payer: Medicare HMO | Admitting: Podiatry

## 2019-08-29 ENCOUNTER — Other Ambulatory Visit: Payer: Self-pay | Admitting: Podiatry

## 2019-08-29 ENCOUNTER — Ambulatory Visit (INDEPENDENT_AMBULATORY_CARE_PROVIDER_SITE_OTHER): Payer: Medicare HMO

## 2019-08-29 DIAGNOSIS — M76822 Posterior tibial tendinitis, left leg: Secondary | ICD-10-CM

## 2019-08-29 DIAGNOSIS — M79671 Pain in right foot: Secondary | ICD-10-CM

## 2019-08-31 NOTE — Progress Notes (Signed)
Subjective:   Patient ID: Alisha Terrell, female   DOB: 78 y.o.   MRN: [id number]   HPI Patient states that she has had complete collapse of her left foot over the last few years and states that she has tried an AFO in the past which helped temporarily but still bothering her and states that it is sore to walk on.  Patient does not smoke likes to be active if possible   Review of Systems  All other systems reviewed and are negative.       Objective:  Physical Exam Vitals signs and nursing note reviewed.  Constitutional:      Appearance: She is well-developed.  Pulmonary:     Effort: Pulmonary effort is normal.  Musculoskeletal: Normal range of motion.  Skin:    General: Skin is warm.  Neurological:     Mental Status: She is alert.     Neurovascular status was found to be intact with range of motion loss of the subtalar joint left collapse of medial longitudinal arch left with inflammation pain around the navicular posterior tib insertion left secondary to the collapse of the medial longitudinal arch.  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  Today her get a try steroidal injection to reduce the inflammatory complex along with continued AFO usage and the utilization of support.  Discussed possible new AFO and ultimately this could require external frame in order to stabilize the arch.  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

## 2019-11-02 ENCOUNTER — Ambulatory Visit: Payer: Medicare Other | Attending: Anesthesiology

## 2019-11-02 DIAGNOSIS — Z23 Encounter for immunization: Secondary | ICD-10-CM

## 2019-11-02 NOTE — Progress Notes (Signed)
   Covid-19 Vaccination Clinic  Name:  Alisha Terrell    MRN: [id number] DOB: [birth year]  11/02/2019  Alisha Terrell was observed post Covid-19 immunization for 15 minutes without incidence. She was provided with Vaccine Information Sheet and instruction to access the V-Safe system.   Alisha Terrell was instructed to call 911 with any severe reactions post vaccine: Alisha Terrell Kitchen Difficulty breathing  . Swelling of your face and throat  . A fast heartbeat  . A bad rash all over your body  . Dizziness and weakness    Immunizations Administered    Name Date Dose VIS Date Route   Pfizer COVID-19 Vaccine 11/02/2019  1:52 PM 0.3 mL 09/23/2019 Intramuscular   Manufacturer: [place]   Lot: [id number]   Aline: [id number]

## 2019-11-12 ENCOUNTER — Ambulatory Visit: Payer: Medicare HMO

## 2019-11-23 ENCOUNTER — Ambulatory Visit: Payer: Medicare HMO | Attending: Internal Medicine

## 2019-11-23 ENCOUNTER — Ambulatory Visit: Payer: Medicare HMO

## 2019-11-23 DIAGNOSIS — Z23 Encounter for immunization: Secondary | ICD-10-CM | POA: Insufficient documentation

## 2019-11-23 NOTE — Progress Notes (Signed)
   Covid-19 Vaccination Clinic  Name:  Alisha Terrell    MRN: [id number] DOB: [birth year]  11/23/2019  [name] was observed post Covid-19 immunization for 15 minutes without incidence. She was provided with Vaccine Information Sheet and instruction to access the V-Safe system.   [name] was instructed to call 911 with any severe reactions post vaccine: Marland Kitchen Difficulty breathing  . Swelling of your face and throat  . A fast heartbeat  . A bad rash all over your body  . Dizziness and weakness    Immunizations Administered    Name Date Dose VIS Date Route   Pfizer COVID-19 Vaccine 11/23/2019  1:08 PM 0.3 mL 09/23/2019 Intramuscular   Manufacturer: [place]   Lot: [id number]   [place]: [id number]

## 2019-12-26 ENCOUNTER — Ambulatory Visit: Payer: Medicare HMO | Admitting: Podiatry

## 2020-01-02 ENCOUNTER — Other Ambulatory Visit: Payer: Self-pay

## 2020-01-02 ENCOUNTER — Encounter: Payer: Self-pay | Admitting: Podiatry

## 2020-01-02 ENCOUNTER — Ambulatory Visit: Payer: Medicare HMO | Admitting: Podiatry

## 2020-01-02 ENCOUNTER — Other Ambulatory Visit: Payer: Self-pay | Admitting: Podiatry

## 2020-01-02 ENCOUNTER — Ambulatory Visit (INDEPENDENT_AMBULATORY_CARE_PROVIDER_SITE_OTHER): Payer: Medicare HMO

## 2020-01-02 VITALS — Temp 97.7°F

## 2020-01-02 DIAGNOSIS — M76822 Posterior tibial tendinitis, left leg: Secondary | ICD-10-CM | POA: Diagnosis not present

## 2020-01-02 DIAGNOSIS — M79671 Pain in right foot: Secondary | ICD-10-CM | POA: Diagnosis not present

## 2020-01-02 DIAGNOSIS — M779 Enthesopathy, unspecified: Secondary | ICD-10-CM | POA: Diagnosis not present

## 2020-01-02 DIAGNOSIS — M76821 Posterior tibial tendinitis, right leg: Secondary | ICD-10-CM

## 2020-01-03 NOTE — Progress Notes (Signed)
Subjective:   Patient ID: Alisha Terrell, female   DOB: 79 y.o.   MRN: [id number]   HPI Patient presents stating I got some relief on my left foot from the turning an injection but it is been sore and now my ankle also hurts in the right foot she knows she needs a new orthotic for this secondary to her orthotic being worn out.  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 with prominent navicular bone creating irritation inflammation and keratotic tissue formation with failure to respond to previous injection trimming procedure with patient noted to have sinus tarsitis left and moderate flatfoot deformity right     Assessment:  Collapsed medial longitudinal arch left with possibility for hypertrophy bone exposure along with inflammatory condition sinus tarsitis and moderate flatfoot deformity right     Plan:  H&P reviewed condition and x-ray of the right foot and today I injected sinus tarsi left 3 mg Dexasone Kenalog 5 mg Xylocaine and debrided lesion on the left.  I am going to have her see Dr. March Rummage for consideration of bone planing of the navicular and we could also consider new AFO which she will need new orthotic right by Liliane Channel but he was unable to do today so is coordinated with Dr. March Rummage at next visit.  We will reevaluate her after she sees Dr. March Rummage  X-rays indicate right foot mild depression of the arch no other indications of pathology or indications that patient is entering a severe flatfoot deformity

## 2020-01-19 ENCOUNTER — Ambulatory Visit: Payer: Medicare HMO | Admitting: Podiatry

## 2020-01-19 ENCOUNTER — Ambulatory Visit: Payer: Medicare HMO | Admitting: Orthotics

## 2020-01-19 ENCOUNTER — Other Ambulatory Visit: Payer: Self-pay

## 2020-01-19 VITALS — Temp 96.1°F

## 2020-01-19 DIAGNOSIS — M76822 Posterior tibial tendinitis, left leg: Secondary | ICD-10-CM

## 2020-01-19 DIAGNOSIS — M779 Enthesopathy, unspecified: Secondary | ICD-10-CM

## 2020-01-19 DIAGNOSIS — M79671 Pain in right foot: Secondary | ICD-10-CM

## 2020-01-19 NOTE — Progress Notes (Signed)
Dr. March Rummage to determine type of device; her old az brace is less than 3 years and she must go back to [place] for another.

## 2020-02-03 ENCOUNTER — Telehealth: Payer: Self-pay | Admitting: Podiatry

## 2020-02-03 NOTE — Telephone Encounter (Signed)
Pt has f/u 02-09-20 with Dr March Rummage for u/s review but hasn't heard from radiology to schedule  yet.  Pt is wanting to know where she can call to schedule.

## 2020-02-07 ENCOUNTER — Telehealth: Payer: Self-pay | Admitting: Podiatry

## 2020-02-07 NOTE — Telephone Encounter (Signed)
Pt called and stated that she still had not been called to sched a ultra sound has appt w/Dr.Price 02/09/20 for a review but has not had ultra spound sched yet please advise

## 2020-02-07 NOTE — Telephone Encounter (Signed)
I reviewed 01/19/2020 and there were no clinicals available or orders.

## 2020-02-09 ENCOUNTER — Ambulatory Visit: Payer: Medicare HMO | Admitting: Podiatry

## 2020-02-24 ENCOUNTER — Other Ambulatory Visit: Payer: Self-pay

## 2020-02-24 ENCOUNTER — Telehealth: Payer: Self-pay | Admitting: *Deleted

## 2020-02-24 ENCOUNTER — Ambulatory Visit: Payer: Medicare HMO | Admitting: Podiatry

## 2020-02-24 DIAGNOSIS — G8929 Other chronic pain: Secondary | ICD-10-CM

## 2020-02-24 DIAGNOSIS — M25572 Pain in left ankle and joints of left foot: Secondary | ICD-10-CM

## 2020-02-24 DIAGNOSIS — M76822 Posterior tibial tendinitis, left leg: Secondary | ICD-10-CM | POA: Diagnosis not present

## 2020-02-24 NOTE — Telephone Encounter (Signed)
Faxed orders to [facility]. 

## 2020-02-24 NOTE — Progress Notes (Signed)
  Subjective:  Patient ID: Alisha Terrell, female    DOB: [birth year],  MRN: [id number]  Chief Complaint  Patient presents with  . Follow-up    L foot and ankle pain. Pt stated, "The pain is a bit worse. I've been doing a lot of walking - my husband was hospitalized twice in the past 2 weeks. Constant 4-5/10 pain, but it increases to 8+/10.     79 y.o. female presents with the above complaint. History confirmed with patient.   Objective:  Physical Exam: warm, good capillary refill, no trophic changes or ulcerative lesions, normal DP and PT pulses and normal sensory exam. Left Foot: POP left PT tendon with local fullness, navicular prominence   Assessment:   1. 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 Capsulitis      Plan:  Patient was evaluated and treated and all questions answered.  PT Tendonitis with Kidner foot  -Order [place] left to eval PT Tendon -Continue bracing and icing.  Return in about 3 weeks (around 02/09/2020) for [place] Review.

## 2020-02-24 NOTE — Telephone Encounter (Signed)
-----   Message from Evelina Bucy, DPM sent at 02/24/2020 11:56 AM EDT ----- Can we order [place] LLE

## 2020-02-24 NOTE — Progress Notes (Signed)
  Subjective:  Patient ID: Alisha Terrell, female    DOB: [birth year],  MRN: [id number]  Chief Complaint  Patient presents with  . Foot Pain    Pt states left foot pain worse than ever.     79 y.o. female presents with the above complaint. History confirmed with patient.   Objective:  Physical Exam: warm, good capillary refill, no trophic changes or ulcerative lesions, normal DP and PT pulses and normal sensory exam. Left Foot: severe pes planus, pain to palpation left PT tendon course with prominent plantar navicular bone    Assessment:   1. 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 Chronic pain of left ankle      Plan:  Patient was evaluated and treated and all questions answered.  Tendonitis, PTT; Pes planus -Prior XR reviewed.  -Order [place] LLE to eval PT tendon. R/o tear.  Return in about 3 weeks (around 03/16/2020).

## 2020-03-14 ENCOUNTER — Ambulatory Visit
Admission: RE | Admit: 2020-03-14 | Discharge: 2020-03-14 | Disposition: A | Discharge status: Home / Self Care | Payer: Medicare HMO | Source: Ambulatory Visit | Attending: Podiatry | Admitting: Podiatry

## 2020-03-14 ENCOUNTER — Other Ambulatory Visit: Payer: Self-pay

## 2020-03-14 DIAGNOSIS — M76822 Posterior tibial tendinitis, left leg: Secondary | ICD-10-CM

## 2020-03-14 DIAGNOSIS — G8929 Other chronic pain: Secondary | ICD-10-CM

## 2020-03-16 ENCOUNTER — Other Ambulatory Visit: Payer: Self-pay

## 2020-03-16 ENCOUNTER — Ambulatory Visit: Payer: Medicare HMO | Admitting: Podiatry

## 2020-03-16 DIAGNOSIS — M79671 Pain in right foot: Secondary | ICD-10-CM | POA: Diagnosis not present

## 2020-03-16 DIAGNOSIS — M76822 Posterior tibial tendinitis, left leg: Secondary | ICD-10-CM

## 2020-03-16 DIAGNOSIS — M25572 Pain in left ankle and joints of left foot: Secondary | ICD-10-CM | POA: Diagnosis not present

## 2020-03-16 DIAGNOSIS — G8929 Other chronic pain: Secondary | ICD-10-CM | POA: Diagnosis not present

## 2020-04-16 NOTE — Progress Notes (Signed)
  Subjective:  Patient ID: Alisha Terrell, female    DOB: 1941-04-29,  MRN: [id number]  Chief Complaint  Patient presents with  . Tendonitis    Pt states ankle pain is even worse than last visit and pain is burning/shooting.    79 y.o. female presents with the above complaint. History confirmed with patient.   Objective:  Physical Exam: warm, good capillary refill, no trophic changes or ulcerative lesions, normal DP and PT pulses and normal sensory exam. Left Foot: severe pes planus, pain to palpation left PT tendon course with prominent plantar navicular bone    Assessment:   1. 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 Chronic pain of left ankle   3. Right foot pain      Plan:  Patient was evaluated and treated and all questions answered.  Tendonitis, PTT; Pes planus -Ultrasound reviewed with patient.  3.3 x 1.1 x 4.0 soft tissue mass and subcu fat at the medial aspect of the arch of the foot adventitial bursitis surrounding the abductor hallucis tendon.  Posterior tibialis tendon without tear but with calcific tendinopathy. -We did discuss the possibility of removing the mass however we discussed the possibility that this could make the pain on her prominence worse we mutually agreed that the best option for right now will be wearing her brace however we advise she could meet with the orthotist for adjustment of the brace as needed  No follow-ups on file.

## 2020-04-19 ENCOUNTER — Other Ambulatory Visit: Payer: Self-pay

## 2020-04-19 ENCOUNTER — Ambulatory Visit: Payer: Medicare HMO | Admitting: Podiatry

## 2020-04-19 VITALS — Temp 95.8°F

## 2020-04-19 DIAGNOSIS — G8929 Other chronic pain: Secondary | ICD-10-CM | POA: Diagnosis not present

## 2020-04-19 DIAGNOSIS — M76822 Posterior tibial tendinitis, left leg: Secondary | ICD-10-CM

## 2020-04-19 DIAGNOSIS — M25572 Pain in left ankle and joints of left foot: Secondary | ICD-10-CM

## 2020-04-19 NOTE — Progress Notes (Signed)
  Subjective:  Patient ID: [name], female    DOB: Aug 16, 1941,  MRN: [id number]  Chief Complaint  Patient presents with  . Follow-up    Left foot. Pt stated, "The pain isn't constant anymore, so that's an important. When it does hurt, it's 8/10. Walking or standing for 30-60 minutes usually causes it".    79 y.o. female presents with the above complaint. History confirmed with patient.   Objective:  Physical Exam: warm, good capillary refill, no trophic changes or ulcerative lesions, normal DP and PT pulses and normal sensory exam. Left Foot: severe pes planus, pain to palpation left PT tendon course with prominent plantar navicular bone . POP sinus tarsi  Assessment:   1. Sinus tarsitis of left foot    Plan:  Patient was evaluated and treated and all questions answered.  Tendonitis, PTT; Pes planus -No pain today -Discussed continued use of brace. I do not think that CMOs will do enough for her. Callus at medial navicular pared courtesy.  Sinus Tarsitis -Injection delivered as below  Procedure: Injection Intermediate Joint Consent: Verbal consent obtained. Location: Left sinus tarsi. Skin Prep: alcohol. Injectate: 1 cc 0.5% marcaine plain, 1 cc dexamethasone phosphate, 0.5 cc kenalog 10. Disposition: Patient tolerated procedure well. Injection site dressed with a band-aid.    No follow-ups on file.

## 2020-05-16 NOTE — Progress Notes (Deleted)
NEUROLOGY FOLLOW UP OFFICE NOTE  Alisha Terrell [id number]  HISTORY OF PRESENT ILLNESS: Alisha Terrell is a 79 year old right-handed female with hypertension who follows up for migraine.  UPDATE: She has had one ocular migraine in March (zigzag lines), lasting a minute.  No other spells with TIA-like symptoms.    HISTORY: On 10/12/15, she developed numbness on the right lower side of her face and lips, as well as the 4th and 5th digits of her right hand.It lasted 15 minutes and resolved.Systolic blood pressure was in the low 200s (typically it is in the 140s).At the time, she felt unsteady but denied dizziness, visual disturbance or focal weakness.She was evaluated at the ED.CT of head showed no acute intracranial process.She declined MRI due to claustrophobia.She has been on ASA 81mg  daily for several years and has doubled the dose since this event. 2D echo from 11/19/15 demonstrated EF 55-60% with no cardiac source of emboli. Carotid doppler showed no hemodynamically significant ICA stenosis.  On 12/14/15, she had a recurrent episode of numbness involving the right lower side of her face and right hand, lasting a few minutes. She went to the ED where blood pressure was 141/94. 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 EEG from 12/26/15 was normal. MRI of cervical spine from 01/02/16 revealed progressive degenerative changes at C1-2 and dens without upper cord deformity. She did see the surgeon who did not recommend any intervention.  On 01/17/16, she had a recurrent spell of right sided facial droop and numbness and tingling. It lasted about 20 minutes. It was similar to prior spells. There was associated headache. She presented to the ED. Head CT was personally reviewed and was negative for acute  abnormality. She was treated with Depakote in the ED, with resolution of headache. She had a couple of episodes of visual aura but no recurrent spells since end of April. She never started the Effexor.  She has a history of visual migraine aura without headache, described as zigzag lines in her visual field, which last about 10 minutes.In November, she had increase in these auras.However, on one occasion, she developed perioral numbness and tingling for short time.It was preceded earlier in the day by a headache.  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  Of note, she reports longstanding episodic dizziness, described as brief sense of movement with head turn to left. She also reports a roaring sound in the left ear. No other lateralizing symptoms.   PAST MEDICAL HISTORY: Past Medical History:  Diagnosis Date   Arthritis    Asthma    Hypertension     MEDICATIONS: Current Outpatient Medications on File Prior to Visit  Medication Sig Dispense Refill   ADVAIR DISKUS 250-50 MCG/DOSE AEPB Inhale 1 puff into the lungs 2 (two) times daily.      amLODipine (NORVASC) 5 MG tablet Take 5 mg by mouth daily.     aspirin EC 81 MG tablet Take 81 mg by mouth daily.     atenolol (TENORMIN) 50 MG tablet Take 50 mg by mouth 2 (two) times daily.     azelastine (ASTELIN) 0.1 % nasal spray Place into the nose.     celecoxib (CELEBREX) 200 MG capsule Take 200 mg by mouth every morning.  Dextromethorphan-Guaifenesin (ROBITUSSIN COUGH/CHEST DM MAX PO) Take 1 mL by mouth 2 (two) times daily as needed (for cold symptoms).      diclofenac Sodium (VOLTAREN) 1 % GEL APPLY TO HANDS TWICE A DAY FOR ARTHRITIS PAIN     fexofenadine (ALLEGRA) 180 MG tablet Take 180 mg by mouth as needed.      fluticasone (FLONASE) 50 MCG/ACT nasal spray Place 1 spray into both nostrils at bedtime.      ibandronate  (BONIVA) 150 MG tablet Take by mouth.     Influenza vac split quadrivalent PF (FLUZONE HIGH-DOSE) 0.5 ML injection Fluzone High-Dose 2019-20 (PF) 180 mcg/0.5 mL intramuscular syringe     losartan (COZAAR) 100 MG tablet Take 100 mg by mouth daily.     montelukast (SINGULAIR) 10 MG tablet Take 10 mg by mouth at bedtime.     Multiple Vitamins-Minerals (CENTRUM SILVER ADULT 50+ PO) Take 1 tablet by mouth daily.     omeprazole (PRILOSEC) 40 MG capsule Take 40 mg by mouth at bedtime.      PROAIR HFA 108 (90 Base) MCG/ACT inhaler Inhale 1-2 puffs into the lungs every 6 (six) hours as needed for wheezing or shortness of breath.      sodium chloride (OCEAN) 0.65 % SOLN nasal spray Place 1 spray into both nostrils as needed for congestion.     traMADol (ULTRAM) 50 MG tablet Take 50 mg by mouth as needed.     No current facility-administered medications on file prior to visit.    ALLERGIES: Allergies  Allergen Reactions   Codeine Rash and Other (See Comments)    Agitation, bad dreams   Omnicef [Cefdinir] Nausea And Vomiting and Rash    FAMILY HISTORY: Family History  Problem Relation Age of Onset   Hypertension Mother 21   Pneumonia Father 21    SOCIAL HISTORY: Social History   Socioeconomic History   Marital status: Married    Spouse name: Dorann Lodge   Number of children: 2   Years of education: Not on file   Highest education level: Not on file  Occupational History   Occupation: retired  Tobacco Use   Smoking status: Passive Smoke Exposure - Never Smoker   Smokeless tobacco: Never Used  Substance and Sexual Activity   Alcohol use: Yes    Alcohol/week: 3.0 standard drinks    Types: 3 Glasses of wine per week   Drug use: No   Sexual activity: Not on file  Other Topics Concern   Not on file  Social History Narrative   Patient is right-handed. She lives with her husband in a one level home. She does not exercise.   Social Determinants of Health    Financial Resource Strain:    Difficulty of Paying Living Expenses:   Food Insecurity:    Worried About Charity fundraiser in the Last Year:    Arboriculturist in the Last Year:   Transportation Needs:    Film/video editor (Medical):    Lack of Transportation (Non-Medical):   Physical Activity:    Days of Exercise per Week:    Minutes of Exercise per Session:   Stress:    Feeling of Stress :   Social Connections:    Frequency of Communication with Friends and Family:    Frequency of Social Gatherings with Friends and Family:    Attends Religious Services:    Active Member of Clubs or Organizations:    Attends Archivist Meetings:  Marital Status:   Intimate Partner Violence:    Fear of Current or Ex-Partner:    Emotionally Abused:    Physically Abused:    Sexually Abused:     PHYSICAL EXAM: *** General: No acute distress.  Patient appears well-groomed.   Head:  Normocephalic/atraumatic Eyes:  Fundi examined but not visualized Neck: supple, no paraspinal tenderness, full range of motion Heart:  Regular rate and rhythm Lungs:  Clear to auscultation bilaterally Back: No paraspinal tenderness Neurological Exam: alert and oriented to person, place, and time. Attention span and concentration intact, recent and remote memory intact, fund of knowledge intact.  Speech fluent and not dysarthric, language intact.  CN II-XII intact. Bulk and tone normal, muscle strength 5/5 throughout.  Sensation to light touch, temperature and vibration intact.  Deep tendon reflexes 2+ throughout, toes downgoing.  Finger to nose and heel to shin testing intact.  Gait normal, Romberg negative.  IMPRESSION: 1.  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

## 2020-05-17 ENCOUNTER — Ambulatory Visit: Payer: Medicare HMO | Admitting: Neurology

## 2020-05-24 ENCOUNTER — Ambulatory Visit: Payer: Medicare HMO | Admitting: Neurology

## 2020-07-30 ENCOUNTER — Other Ambulatory Visit: Payer: Self-pay

## 2020-07-30 ENCOUNTER — Encounter: Payer: Self-pay | Admitting: Internal Medicine

## 2020-07-30 ENCOUNTER — Encounter: Payer: Self-pay | Admitting: *Deleted

## 2020-07-30 ENCOUNTER — Ambulatory Visit: Payer: Medicare HMO | Admitting: Internal Medicine

## 2020-07-30 VITALS — BP 130/80 | HR 69 | Temp 98.4°F | Ht 59.0 in | Wt 179.0 lb

## 2020-07-30 DIAGNOSIS — I10 Essential (primary) hypertension: Secondary | ICD-10-CM

## 2020-07-30 DIAGNOSIS — R002 Palpitations: Secondary | ICD-10-CM | POA: Diagnosis not present

## 2020-07-30 DIAGNOSIS — G459 Transient cerebral ischemic attack, unspecified: Secondary | ICD-10-CM | POA: Diagnosis not present

## 2020-07-30 DIAGNOSIS — R0600 Dyspnea, unspecified: Secondary | ICD-10-CM | POA: Diagnosis not present

## 2020-07-30 DIAGNOSIS — G43109 Migraine with aura, not intractable, without status migrainosus: Secondary | ICD-10-CM | POA: Diagnosis not present

## 2020-07-30 DIAGNOSIS — G4733 Obstructive sleep apnea (adult) (pediatric): Secondary | ICD-10-CM

## 2020-07-30 NOTE — Patient Instructions (Signed)
Medication Instructions:  No Changes In Medications at this time.  *If you need a refill on your cardiac medications before your next appointment, please call your pharmacy*  Lab Work: None Ordered At This Time.  If you have labs (blood work) drawn today and your tests are completely normal, you will receive your results only by: Marland Kitchen MyChart Message (if you have MyChart) OR . A paper copy in the mail If you have any lab test that is abnormal or we need to change your treatment, we will call you to review the results.  Testing/Procedures: Your physician has requested that you have an echocardiogram. Echocardiography is a painless test that uses sound waves to create images of your heart. It provides your doctor with information about the size and shape of your heart and how well your heart's chambers and valves are working. You may receive an ultrasound enhancing agent through an IV if needed to better visualize your heart during the echo.This procedure takes approximately one hour. There are no restrictions for this procedure. This will take place at the [address].   [employer] Cardiac Event Monitor Instructions Your physician has requested you wear your cardiac event monitor for 30 days, (1-30). [employer] may call or text to confirm a shipping address. The monitor will be sent to a land address via UPS. [employer] will not ship a monitor to a PO BOX. It typically takes 3-5 days to receive your monitor after it has been enrolled. [employer] will assist with USPS tracking if your package is delayed. The telephone number for [employer] is [phone]. Once you have received your monitor, please review the enclosed instructions. Instruction tutorials can also be viewed under help and settings on the enclosed cell phone. Your monitor has already been registered assigning a specific monitor serial # to you.  Applying the monitor Remove cell phone from case and turn it on. The  cell phone works as Dealer and needs to be within [employer] of you at all times. The cell phone will need to be charged on a daily basis. We recommend you plug the cell phone into the enclosed charger at your bedside table every night.  Monitor batteries: You will receive two monitor batteries labelled #1 and #2. These are your recorders. Plug battery #2 onto the second connection on the enclosed charger. Keep one battery on the charger at all times. This will keep the monitor battery deactivated. It will also keep it fully charged for when you need to switch your monitor batteries. A small light will be blinking on the battery emblem when it is charging. The light on the battery emblem will remain on when the battery is fully charged.  Open package of a Monitor strip. Insert battery #1 into black hood on strip and gently squeeze monitor battery onto connection as indicated in instruction booklet. Set aside while preparing skin.  Choose location for your strip, vertical or horizontal, as indicated in the instruction booklet. Shave to remove all hair from location. There cannot be any lotions, oils, powders, or colognes on skin where monitor is to be applied. Wipe skin clean with enclosed Saline wipe. Dry skin completely.  Peel paper labeled #1 off the back of the Monitor strip exposing the adhesive. Place the monitor on the chest in the vertical or horizontal position shown in the instruction booklet. One arrow on the monitor strip must be pointing upward. Carefully remove paper labeled #2, attaching remainder of strip to your  skin. Try not to create any folds or wrinkles in the strip as you apply it.  Firmly press and release the circle in the center of the monitor battery. You will hear a small beep. This is turning the monitor battery on. The heart emblem on the monitor battery will light up every 5 seconds if the monitor battery in turned on and connected to the patient securely.  Do not push and hold the circle down as this turns the monitor battery off. The cell phone will locate the monitor battery. A screen will appear on the cell phone checking the connection of your monitor strip. This may read poor connection initially but change to good connection within the next minute. Once your monitor accepts the connection you will hear a series of 3 beeps followed by a climbing crescendo of beeps. A screen will appear on the cell phone showing the two monitor strip placement options. Touch the picture that demonstrates where you applied the monitor strip.  Your monitor strip and battery are waterproof. You are able to shower, bathe, or swim with the monitor on. They just ask you do not submerge deeper than 3 feet underwater. We recommend removing the monitor if you are swimming in a lake, river, or ocean.  Your monitor battery will need to be switched to a fully charged monitor battery approximately once a week. The cell phone will alert you of an action which needs to be made.  On the cell phone, tap for details to reveal connection status, monitor battery status, and cell phone battery status. The green dots indicates your monitor is in good status. A red dot indicates there is something that needs your attention.  To record a symptom, click the circle on the monitor battery. In 30-60 seconds a list of symptoms will appear on the cell phone. Select your symptom and tap save. Your monitor will record a sustained or significant arrhythmia regardless of you clicking the button. Some patients do not feel the heart rhythm irregularities. [employer] will notify us of any serious or critical events.  Refer to instruction booklet for instructions on switching batteries, changing strips, the Do not disturb or Pause features, or any additional questions.  Call [employer] at [phone], to confirm your monitor is transmitting and record your baseline. They will answer any  questions you may have regarding the monitor instructions at that time.  Returning the monitor to [place] all equipment back into blue box. Peel off strip of paper to expose adhesive and close box securely. There is a prepaid UPS shipping label on this box. Drop in a UPS drop box, or at a UPS facility like [employer]. You may also contact [employer] to arrange UPS to pick up monitor package at your home.  Follow-Up: At [facility], you and your health needs are our priority.  As part of our continuing mission to provide you with exceptional heart care, we have created designated Provider Care Teams.  These Care Teams include your primary Cardiologist (physician) and Advanced Practice Providers (APPs -  Physician Assistants and Nurse Practitioners) who all work together to provide you with the care you need, when you need it.  Your next appointment:   2 month(s)  The format for your next appointment:   In Person  Provider:   Cherlynn Kaiser, MD

## 2020-07-30 NOTE — Progress Notes (Signed)
Patient ID: Alisha Terrell, female   DOB: Aug 20, 1941, 79 y.o.   MRN: [id number] Patient enrolled for [employer] to ship a 30 day cardiac event monitor to be shipped to her home.

## 2020-07-30 NOTE — Progress Notes (Signed)
Cardiology Office Note:    Date:  07/30/2020   ID:  Alisha Terrell, DOB 11-Jun-1941, MRN [id number]  PCP:  Christain Sacramento, MD  Cardiologist:  No primary care provider on file.  Electrophysiologist:  None   Referring MD: Elijio Miles, MD   Chief Complaint/Reason for Referral: Irregular heart rhythm  History of Present Illness:    Alisha Terrell is a 79 y.o. female with a history of chronic asthma, OSA, hypertension who presents for evaluation of incidentally noted irregular heart rhythm with a history of prior TIAs.  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  They both contracted Covid in September after a visit from their grandchildren.  She has mostly recovered from this illness and did not require hospitalization.  She has some chronic shortness of breath related to her asthma and is followed closely by pulmonology.  She was started on Lasix 20 mg daily after chest x-ray revealed pulmonary vascular congestion, and she is feeling much better.  She has lost 10 pounds.  She has been told for many years that she has an irregular heart rhythm but has no abnormalities documented on EKGs.  In 2017 through 2019 she has had several TIAs versus complex migraine.  In 2016 she had an episode of numbness on the right lower side of her face and lips as well as numbness of the fourth and fifth digit of the right hand.  Blood pressure was significantly elevated in the 200s.  Prior echocardiogram from 2017 unremarkable.  No carotid artery disease in 2017.  She takes aspirin for stroke prevention.  She does not have bothersome palpitations but can take her carotid pulse and feels the irregular beats.  No chest pain. Sometimes gets a "squiggly thing" in chest, separate from her GERD symptoms of nausea.   Past Medical History:  Diagnosis Date  . Arthritis   . Asthma   . Hypertension     Past Surgical History:  Procedure Laterality Date  . ABDOMINAL SURGERY    .  CESAREAN SECTION    . CHOLECYSTECTOMY      Current Medications: Current Meds  Medication Sig  . ADVAIR DISKUS 250-50 MCG/DOSE AEPB Inhale 1 puff into the lungs 2 (two) times daily.   Marland Kitchen amLODipine (NORVASC) 5 MG tablet Take 5 mg by mouth daily.  Marland Kitchen aspirin EC 81 MG tablet Take 81 mg by mouth daily.  Marland Kitchen atenolol (TENORMIN) 50 MG tablet Take 50 mg by mouth 2 (two) times daily.  Marland Kitchen azelastine (ASTELIN) 0.1 % nasal spray Place into the nose.  Marland Kitchen CALCIUM-VITAMIN D PO Take 1 tablet by mouth daily.  . celecoxib (CELEBREX) 200 MG capsule Take 200 mg by mouth every morning.   Marland Kitchen Dextromethorphan-Guaifenesin (ROBITUSSIN COUGH/CHEST DM MAX PO) Take 1 mL by mouth 2 (two) times daily as needed (for cold symptoms).   . diclofenac Sodium (VOLTAREN) 1 % GEL APPLY TO HANDS TWICE A DAY FOR ARTHRITIS PAIN  . fexofenadine (ALLEGRA) 180 MG tablet Take 180 mg by mouth as needed.   . fluticasone (FLONASE) 50 MCG/ACT nasal spray Place 1 spray into both nostrils at bedtime.   . ibandronate (BONIVA) 150 MG tablet Take 150 mg by mouth every 30 (thirty) days.   . Influenza vac split quadrivalent PF (FLUZONE HIGH-DOSE) 0.5 ML injection Fluzone High-Dose 2019-20 (PF) 180 mcg/0.5 mL intramuscular syringe  . losartan (COZAAR) 100 MG tablet Take 100 mg by mouth daily.  . montelukast (SINGULAIR) 10 MG tablet  Take 10 mg by mouth at bedtime.  . Multiple Vitamins-Minerals (CENTRUM SILVER ADULT 50+ PO) Take 1 tablet by mouth daily.  Marland Kitchen omeprazole (PRILOSEC) 40 MG capsule Take 40 mg by mouth in the morning and at bedtime.   Marland Kitchen PROAIR HFA 108 (90 Base) MCG/ACT inhaler Inhale 1-2 puffs into the lungs every 6 (six) hours as needed for wheezing or shortness of breath.   . sodium chloride (OCEAN) 0.65 % SOLN nasal spray Place 1 spray into both nostrils as needed for congestion.  . traMADol (ULTRAM) 50 MG tablet Take 50 mg by mouth as needed.     Allergies:   Codeine and Omnicef [cefdinir]   Social History   Tobacco Use  . Smoking  status: Passive Smoke Exposure - Never Smoker  . Smokeless tobacco: Never Used  Substance Use Topics  . Alcohol use: Yes    Alcohol/week: 3.0 standard drinks    Types: 3 Glasses of wine per week  . Drug use: No     Family History: The patient's family history includes Hypertension (age of onset: 68) in her mother; Pneumonia (age of onset: 38) in her father.  ROS:   Please see the history of present illness.    All other systems reviewed and are negative.  EKGs/Labs/Other Studies Reviewed:    The following studies were reviewed today:  EKG:  SR, first deg AVB, LVH, T wave abnl anteriorly  Recent Labs: No results found for requested labs within last 8760 hours.  Recent Lipid Panel    Component Value Date/Time   CHOL 134 11/14/2015 1004   TRIG 24.0 11/14/2015 1004   HDL 78.70 11/14/2015 1004   CHOLHDL 2 11/14/2015 1004   VLDL 4.8 11/14/2015 1004   LDLCALC 51 11/14/2015 1004    Physical Exam:    VS:  BP 130/80 (BP Location: Left Arm, Patient Position: Sitting, Cuff Size: Normal)   Pulse 69   Temp 98.4 F (36.9 C)   Ht 4\' 11"  (1.499 m)   Wt 179 lb (81.2 kg)   BMI 36.15 kg/m     Wt Readings from Last 5 Encounters:  05/18/19 189 lb (85.7 kg)  02/05/18 184 lb (83.5 kg)  01/29/17 198 lb (89.8 kg)  05/12/16 202 lb (91.6 kg)  12/24/15 200 lb 12.8 oz (91.1 kg)    Constitutional: No acute distress Eyes: sclera non-icteric, normal conjunctiva and lids ENMT: normal dentition, moist mucous membranes Cardiovascular: regular rhythm, normal rate, 1/6 systolic murmur. S1 and S2 normal. Radial pulses normal bilaterally. No jugular venous distention.  Respiratory: clear to auscultation bilaterally GI : normal bowel sounds, soft and nontender. No distention.   MSK: extremities warm, well perfused. No edema.  NEURO: grossly nonfocal exam, moves all extremities. PSYCH: alert and oriented x 3, normal mood and affect.   ASSESSMENT:    1. TIA (transient ischemic attack)   2.  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 Palpitations   5. Essential hypertension   6. OSA (obstructive sleep apnea)    PLAN:    TIA (transient ischemic attack) -  Palpitations - Plan: EKG 12-Lead, CARDIAC EVENT MONITOR, ECHOCARDIOGRAM COMPLETE - with a history of TIAs and physical exam finding at pulmonary appointment of irregular heart rhythm, we will need to screen for atrial fibrillation. May represent complex migraine as well.   Dyspnea, unspecified type - Plan: EKG 12-Lead, CARDIAC EVENT MONITOR, ECHOCARDIOGRAM COMPLETE  - will obtain echocardiogram. With history of OSA and asthma, would  like to screen for pulmonary hypertension and RV size and function. Diastolic function as well.    Total time of encounter: 45 minutes total time of encounter, including 25 minutes spent in face-to-face patient care on the date of this encounter. This time includes coordination of care and counseling regarding above mentioned problem list. Remainder of non-face-to-face time involved reviewing chart documents/testing relevant to the patient encounter and documentation in the medical record. I have independently reviewed documentation from referring provider. >15 pages of outside records reviewed in conjunction with this consultation.   Alisha Kaiser, MD [place]  [facility]    Medication Adjustments/Labs and Tests Ordered: Current medicines are reviewed at length with the patient today.  Concerns regarding medicines are outlined above.   Orders Placed This Encounter  Procedures  . CARDIAC EVENT MONITOR  . EKG 12-Lead  . ECHOCARDIOGRAM COMPLETE    No orders of the defined types were placed in this encounter.   Patient Instructions  Medication Instructions:  No Changes In Medications at this time.  *If you need a refill on your cardiac medications before your next appointment, please call your pharmacy*  Lab Work: None Ordered At This Time.  If you  have labs (blood work) drawn today and your tests are completely normal, you will receive your results only by: Marland Kitchen MyChart Message (if you have MyChart) OR . A paper copy in the mail If you have any lab test that is abnormal or we need to change your treatment, we will call you to review the results.  Testing/Procedures: Your physician has requested that you have an echocardiogram. Echocardiography is a painless test that uses sound waves to create images of your heart. It provides your doctor with information about the size and shape of your heart and how well your heart's chambers and valves are working. You may receive an ultrasound enhancing agent through an IV if needed to better visualize your heart during the echo.This procedure takes approximately one hour. There are no restrictions for this procedure. This will take place at the [address].   [employer] Cardiac Event Monitor Instructions Your physician has requested you wear your cardiac event monitor for 30 days, (1-30). [employer] may call or text to confirm a shipping address. The monitor will be sent to a land address via UPS. [employer] will not ship a monitor to a PO BOX. It typically takes 3-5 days to receive your monitor after it has been enrolled. [employer] will assist with USPS tracking if your package is delayed. The telephone number for [employer] is [phone]. Once you have received your monitor, please review the enclosed instructions. Instruction tutorials can also be viewed under help and settings on the enclosed cell phone. Your monitor has already been registered assigning a specific monitor serial # to you.  Applying the monitor Remove cell phone from case and turn it on. The cell phone works as Dealer and needs to be within [employer] of you at all times. The cell phone will need to be charged on a daily basis. We recommend you plug the cell phone into the enclosed charger at your  bedside table every night.  Monitor batteries: You will receive two monitor batteries labelled #1 and #2. These are your recorders. Plug battery #2 onto the second connection on the enclosed charger. Keep one battery on the charger at all times. This will keep the monitor battery deactivated. It will also keep it fully charged for when you  need to switch your monitor batteries. A small light will be blinking on the battery emblem when it is charging. The light on the battery emblem will remain on when the battery is fully charged.  Open package of a Monitor strip. Insert battery #1 into black hood on strip and gently squeeze monitor battery onto connection as indicated in instruction booklet. Set aside while preparing skin.  Choose location for your strip, vertical or horizontal, as indicated in the instruction booklet. Shave to remove all hair from location. There cannot be any lotions, oils, powders, or colognes on skin where monitor is to be applied. Wipe skin clean with enclosed Saline wipe. Dry skin completely.  Peel paper labeled #1 off the back of the Monitor strip exposing the adhesive. Place the monitor on the chest in the vertical or horizontal position shown in the instruction booklet. One arrow on the monitor strip must be pointing upward. Carefully remove paper labeled #2, attaching remainder of strip to your skin. Try not to create any folds or wrinkles in the strip as you apply it.  Firmly press and release the circle in the center of the monitor battery. You will hear a small beep. This is turning the monitor battery on. The heart emblem on the monitor battery will light up every 5 seconds if the monitor battery in turned on and connected to the patient securely. Do not push and hold the circle down as this turns the monitor battery off. The cell phone will locate the monitor battery. A screen will appear on the cell phone checking the connection of your monitor strip. This  may read poor connection initially but change to good connection within the next minute. Once your monitor accepts the connection you will hear a series of 3 beeps followed by a climbing crescendo of beeps. A screen will appear on the cell phone showing the two monitor strip placement options. Touch the picture that demonstrates where you applied the monitor strip.  Your monitor strip and battery are waterproof. You are able to shower, bathe, or swim with the monitor on. They just ask you do not submerge deeper than 3 feet underwater. We recommend removing the monitor if you are swimming in a lake, river, or ocean.  Your monitor battery will need to be switched to a fully charged monitor battery approximately once a week. The cell phone will alert you of an action which needs to be made.  On the cell phone, tap for details to reveal connection status, monitor battery status, and cell phone battery status. The green dots indicates your monitor is in good status. A red dot indicates there is something that needs your attention.  To record a symptom, click the circle on the monitor battery. In 30-60 seconds a list of symptoms will appear on the cell phone. Select your symptom and tap save. Your monitor will record a sustained or significant arrhythmia regardless of you clicking the button. Some patients do not feel the heart rhythm irregularities. [employer] will notify us of any serious or critical events.  Refer to instruction booklet for instructions on switching batteries, changing strips, the Do not disturb or Pause features, or any additional questions.  Call [employer] at [phone], to confirm your monitor is transmitting and record your baseline. They will answer any questions you may have regarding the monitor instructions at that time.  Returning the monitor to [place] all equipment back into blue box. Peel off strip of paper to expose adhesive and  close box securely.  There is a prepaid UPS shipping label on this box. Drop in a UPS drop box, or at a UPS facility like [employer]. You may also contact [employer] to arrange UPS to pick up monitor package at your home.  Follow-Up: At [facility], you and your health needs are our priority.  As part of our continuing mission to provide you with exceptional heart care, we have created designated Provider Care Teams.  These Care Teams include your primary Cardiologist (physician) and Advanced Practice Providers (APPs -  Physician Assistants and Nurse Practitioners) who all work together to provide you with the care you need, when you need it.  Your next appointment:   2 month(s)  The format for your next appointment:   In Person  Provider:   Cherlynn Kaiser, MD

## 2020-08-07 ENCOUNTER — Ambulatory Visit (INDEPENDENT_AMBULATORY_CARE_PROVIDER_SITE_OTHER): Payer: Medicare HMO

## 2020-08-07 DIAGNOSIS — R0600 Dyspnea, unspecified: Secondary | ICD-10-CM

## 2020-08-07 DIAGNOSIS — G459 Transient cerebral ischemic attack, unspecified: Secondary | ICD-10-CM

## 2020-08-07 DIAGNOSIS — R002 Palpitations: Secondary | ICD-10-CM

## 2020-08-17 ENCOUNTER — Ambulatory Visit (HOSPITAL_COMMUNITY): Payer: Medicare HMO | Attending: Cardiology

## 2020-08-17 ENCOUNTER — Other Ambulatory Visit: Payer: Self-pay

## 2020-08-17 DIAGNOSIS — R002 Palpitations: Secondary | ICD-10-CM

## 2020-08-17 DIAGNOSIS — R0600 Dyspnea, unspecified: Secondary | ICD-10-CM | POA: Diagnosis not present

## 2020-08-17 DIAGNOSIS — G459 Transient cerebral ischemic attack, unspecified: Secondary | ICD-10-CM | POA: Insufficient documentation

## 2020-08-17 LAB — ECHOCARDIOGRAM COMPLETE
Area-P 1/2: 3.77 cm2
S' Lateral: 3.2 cm

## 2020-08-21 ENCOUNTER — Other Ambulatory Visit: Payer: Self-pay | Admitting: Family Medicine

## 2020-08-21 DIAGNOSIS — Z09 Encounter for follow-up examination after completed treatment for conditions other than malignant neoplasm: Secondary | ICD-10-CM

## 2020-08-31 ENCOUNTER — Other Ambulatory Visit: Payer: Medicare HMO

## 2020-09-13 ENCOUNTER — Ambulatory Visit
Admission: RE | Admit: 2020-09-13 | Discharge: 2020-09-13 | Disposition: A | Discharge status: Home / Self Care | Payer: Medicare HMO | Source: Ambulatory Visit | Attending: Family Medicine | Admitting: Family Medicine

## 2020-09-13 DIAGNOSIS — Z09 Encounter for follow-up examination after completed treatment for conditions other than malignant neoplasm: Secondary | ICD-10-CM

## 2020-09-28 ENCOUNTER — Other Ambulatory Visit: Payer: Self-pay | Admitting: Orthopaedic Surgery

## 2020-09-28 DIAGNOSIS — G894 Chronic pain syndrome: Secondary | ICD-10-CM

## 2020-10-16 ENCOUNTER — Other Ambulatory Visit: Payer: Self-pay

## 2020-10-16 ENCOUNTER — Ambulatory Visit (INDEPENDENT_AMBULATORY_CARE_PROVIDER_SITE_OTHER): Payer: Medicare HMO | Admitting: Internal Medicine

## 2020-10-16 ENCOUNTER — Encounter: Payer: Self-pay | Admitting: Internal Medicine

## 2020-10-16 VITALS — BP 140/88 | HR 71 | Ht 59.0 in | Wt 177.0 lb

## 2020-10-16 DIAGNOSIS — R002 Palpitations: Secondary | ICD-10-CM

## 2020-10-16 DIAGNOSIS — R0600 Dyspnea, unspecified: Secondary | ICD-10-CM

## 2020-10-16 DIAGNOSIS — I10 Essential (primary) hypertension: Secondary | ICD-10-CM | POA: Diagnosis not present

## 2020-10-16 DIAGNOSIS — G459 Transient cerebral ischemic attack, unspecified: Secondary | ICD-10-CM

## 2020-10-16 NOTE — Patient Instructions (Signed)
Medication Instructions:  No Changes In Medications at this time.  *If you need a refill on your cardiac medications before your next appointment, please call your pharmacy*  Follow-Up: At [facility], you and your health needs are our priority.  As part of our continuing mission to provide you with exceptional heart care, we have created designated Provider Care Teams.  These Care Teams include your primary Cardiologist (physician) and Advanced Practice Providers (APPs -  Physician Assistants and Nurse Practitioners) who all work together to provide you with the care you need, when you need it.  Your next appointment:   3 month(s)  The format for your next appointment:   In Person  Provider:   Gayatri Acharya, MD  

## 2020-10-16 NOTE — Progress Notes (Signed)
Cardiology Office Note:    Date:  10/16/2020   ID:  [name], DOB Mar 26, 1941, MRN [id number]  PCP:  Barbie Banner, MD  Cardiologist:  No primary care provider on file.  Electrophysiologist:  None   Referring MD: Barbie Banner, MD   Chief Complaint/Reason for Referral: Irregular heart beats  History of Present Illness:    Alisha Terrell is a 80 y.o. female with a history of chronic asthma, OSA, hypertension who presents for evaluation of incidentally noted irregular heart rhythm with a history of prior TIAs.   Reviewed echo which was grossly normal.  Monitor showed 20-30% burden of bradycardia but no afib. No diary events.   She is overall feeling well and improving from prior covid infection in Sept 2021.  Past Medical History:  Diagnosis Date  . Arthritis   . Asthma   . Hypertension     Past Surgical History:  Procedure Laterality Date  . ABDOMINAL SURGERY    . CESAREAN SECTION    . CHOLECYSTECTOMY      Current Medications: Current Meds  Medication Sig  . ADVAIR DISKUS 250-50 MCG/DOSE AEPB Inhale 1 puff into the lungs 2 (two) times daily.   Marland Kitchen amLODipine (NORVASC) 5 MG tablet Take 5 mg by mouth daily.  Marland Kitchen aspirin EC 81 MG tablet Take 81 mg by mouth daily.  Marland Kitchen atenolol (TENORMIN) 50 MG tablet Take 50 mg by mouth 2 (two) times daily.  Marland Kitchen azelastine (ASTELIN) 0.1 % nasal spray Place into the nose.  Marland Kitchen CALCIUM-VITAMIN D PO Take 1 tablet by mouth daily.  . celecoxib (CELEBREX) 200 MG capsule Take 200 mg by mouth every morning.   Marland Kitchen Dextromethorphan-Guaifenesin (ROBITUSSIN COUGH/CHEST DM MAX PO) Take 1 mL by mouth 2 (two) times daily as needed (for cold symptoms).   . diclofenac Sodium (VOLTAREN) 1 % GEL APPLY TO HANDS TWICE A DAY FOR ARTHRITIS PAIN  . fexofenadine (ALLEGRA) 180 MG tablet Take 180 mg by mouth as needed.   . fluticasone (FLONASE) 50 MCG/ACT nasal spray Place 1 spray into both nostrils at bedtime.   . furosemide (LASIX) 20 MG tablet Take one  tablet every morning for excess fluid  . ibandronate (BONIVA) 150 MG tablet Take 150 mg by mouth every 30 (thirty) days.   . Influenza vac split quadrivalent PF (FLUZONE HIGH-DOSE) 0.5 ML injection Fluzone High-Dose 2019-20 (PF) 180 mcg/0.5 mL intramuscular syringe  . losartan (COZAAR) 100 MG tablet Take 100 mg by mouth daily.  . montelukast (SINGULAIR) 10 MG tablet Take 10 mg by mouth at bedtime.  . Multiple Vitamins-Minerals (CENTRUM SILVER ADULT 50+ PO) Take 1 tablet by mouth daily.  Marland Kitchen omeprazole (PRILOSEC) 40 MG capsule Take 40 mg by mouth in the morning and at bedtime.   Marland Kitchen PROAIR HFA 108 (90 Base) MCG/ACT inhaler Inhale 1-2 puffs into the lungs every 6 (six) hours as needed for wheezing or shortness of breath.   . sodium chloride (OCEAN) 0.65 % SOLN nasal spray Place 1 spray into both nostrils as needed for congestion.  . traMADol (ULTRAM) 50 MG tablet Take 50 mg by mouth as needed.     Allergies:   Codeine and Omnicef [cefdinir]   Social History   Tobacco Use  . Smoking status: Passive Smoke Exposure - Never Smoker  . Smokeless tobacco: Never Used  Substance Use Topics  . Alcohol use: Yes    Alcohol/week: 3.0 standard drinks    Types: 3 Glasses of wine per week  .  Drug use: No     Family History: The patient's family history includes Hypertension (age of onset: 58) in her mother; Pneumonia (age of onset: 73) in her father.  ROS:   Please see the history of present illness.    All other systems reviewed and are negative.  EKGs/Labs/Other Studies Reviewed:    The following studies were reviewed today: Echo Monitor  Recent Labs: No results found for requested labs within last 8760 hours.  Recent Lipid Panel    Component Value Date/Time   CHOL 134 11/14/2015 1004   TRIG 24.0 11/14/2015 1004   HDL 78.70 11/14/2015 1004   CHOLHDL 2 11/14/2015 1004   VLDL 4.8 11/14/2015 1004   LDLCALC 51 11/14/2015 1004    Physical Exam:    VS:  BP 140/88   Pulse 71   Ht 4\' 11"   (1.499 m)   Wt 177 lb (80.3 kg)   SpO2 99%   BMI 35.75 kg/m     Wt Readings from Last 5 Encounters:  10/16/20 177 lb (80.3 kg)  07/30/20 179 lb (81.2 kg)  05/18/19 189 lb (85.7 kg)  02/05/18 184 lb (83.5 kg)  01/29/17 198 lb (89.8 kg)    Constitutional: No acute distress Eyes: sclera non-icteric, normal conjunctiva and lids ENMT: normal dentition, moist mucous membranes Cardiovascular: regular rhythm, normal rate, no murmurs. S1 and S2 normal. Radial pulses normal bilaterally. No jugular venous distention.  Respiratory: clear to auscultation bilaterally GI : normal bowel sounds, soft and nontender. No distention.   MSK: extremities warm, well perfused. No edema.  NEURO: grossly nonfocal exam, moves all extremities. PSYCH: alert and oriented x 3, normal mood and affect.   ASSESSMENT:    1. TIA (transient ischemic attack)   2. Palpitations   3. Dyspnea, unspecified type   4. Essential hypertension    PLAN:    TIA (transient ischemic attack) -  Palpitations - no afib on monitor. No recorded symptoms.  - frequent bradycardia, would consider decrease dose of atenolol. Will consider. Currently no dizziness or lightheadedness.   Dyspnea, unspecified type - grade 1 diastolic dysfunction and no pulmonary htn. Suspect residual from covid infection. Continue to observe.  HTN - BP mildly elevated but has not been significant elevated outside of office. Would continue amlodipine 5 mg daily, lasix, losartan 100 mg daily, atenolol. If we decrease dose of atenolol, could increase amlodipine or add thiazide or spironolactone.  Total time of encounter: 30 minutes total time of encounter, including 25 minutes spent in face-to-face patient care on the date of this encounter. This time includes coordination of care and counseling regarding above mentioned problem list. Remainder of non-face-to-face time involved reviewing chart documents/testing relevant to the patient encounter and  documentation in the medical record. I have independently reviewed documentation from referring provider.   Cherlynn Kaiser, MD [place]  [facility]    Medication Adjustments/Labs and Tests Ordered: Current medicines are reviewed at length with the patient today.  Concerns regarding medicines are outlined above.     Patient Instructions  Medication Instructions:  No Changes In Medications at this time.  *If you need a refill on your cardiac medications before your next appointment, please call your pharmacy*  Follow-Up: At [facility], you and your health needs are our priority.  As part of our continuing mission to provide you with exceptional heart care, we have created designated Provider Care Teams.  These Care Teams include your primary Cardiologist (physician) and Advanced Practice Providers (APPs -  Physician Assistants and Nurse Practitioners) who all work together to provide you with the care you need, when you need it.  Your next appointment:   3 month(s)  The format for your next appointment:   In Person  Provider:   Cherlynn Kaiser, MD

## 2020-10-22 ENCOUNTER — Other Ambulatory Visit: Payer: Self-pay

## 2020-10-22 ENCOUNTER — Ambulatory Visit
Admission: RE | Admit: 2020-10-22 | Discharge: 2020-10-22 | Disposition: A | Discharge status: Home / Self Care | Payer: Medicare HMO | Source: Ambulatory Visit | Attending: Orthopaedic Surgery | Admitting: Orthopaedic Surgery

## 2020-10-22 DIAGNOSIS — G894 Chronic pain syndrome: Secondary | ICD-10-CM

## 2021-01-15 ENCOUNTER — Ambulatory Visit: Payer: Medicare HMO | Admitting: Internal Medicine

## 2021-01-15 ENCOUNTER — Other Ambulatory Visit: Payer: Self-pay

## 2021-01-15 ENCOUNTER — Encounter: Payer: Self-pay | Admitting: Internal Medicine

## 2021-01-15 VITALS — BP 130/82 | HR 84 | Ht 59.0 in | Wt 183.6 lb

## 2021-01-15 DIAGNOSIS — R0600 Dyspnea, unspecified: Secondary | ICD-10-CM

## 2021-01-15 DIAGNOSIS — R002 Palpitations: Secondary | ICD-10-CM | POA: Diagnosis not present

## 2021-01-15 DIAGNOSIS — I10 Essential (primary) hypertension: Secondary | ICD-10-CM

## 2021-01-15 DIAGNOSIS — G459 Transient cerebral ischemic attack, unspecified: Secondary | ICD-10-CM

## 2021-01-15 DIAGNOSIS — G4733 Obstructive sleep apnea (adult) (pediatric): Secondary | ICD-10-CM

## 2021-01-15 NOTE — Patient Instructions (Signed)

## 2021-01-15 NOTE — Progress Notes (Signed)
Cardiology Office Note:    Date:  01/15/2021   ID:  Alisha Terrell, DOB Jun 23, 1941, MRN [id number]  PCP:  Christain Sacramento, MD  Cardiologist:  No primary care provider on file.  Electrophysiologist:  None   Referring MD: Christain Sacramento, MD   Chief Complaint/Reason for Referral: Irregular heart beats  History of Present Illness:    Alisha Terrell is a 80 y.o. female with a history of chronic asthma, OSA, hypertension who presents for evaluation of incidentally noted irregular heart rhythm with a history of prior TIAs.    Reviewed echo which was grossly normal. Monitor showed 20-30% burden of bradycardia but no afib. No diary events.  One episode of palpitations - she had just gotten up - rush of adrenaline in chest, lasted for seconds and then happened again 4-5 x in a row - took pepto and got better.   The patient denies chest pain, chest pressure, dyspnea at rest or with exertion, PND, orthopnea, or leg swelling. Denies cough, fever, chills. Denies nausea, vomiting. Denies syncope or presyncope. Denies dizziness or lightheadedness.    Past Medical History:  Diagnosis Date   Arthritis    Asthma    Hypertension     Past Surgical History:  Procedure Laterality Date   ABDOMINAL SURGERY     CESAREAN SECTION     CHOLECYSTECTOMY      Current Medications: Current Meds  Medication Sig   ADVAIR DISKUS 250-50 MCG/DOSE AEPB Inhale 1 puff into the lungs 2 (two) times daily.    amLODipine (NORVASC) 5 MG tablet Take 5 mg by mouth daily.   aspirin EC 81 MG tablet Take 81 mg by mouth daily.   atenolol (TENORMIN) 50 MG tablet Take 50 mg by mouth 2 (two) times daily.   azelastine (ASTELIN) 0.1 % nasal spray Place into the nose.   CALCIUM-VITAMIN D PO Take 1 tablet by mouth daily.   celecoxib (CELEBREX) 200 MG capsule Take 200 mg by mouth every morning.    Dextromethorphan-Guaifenesin (ROBITUSSIN COUGH/CHEST DM MAX PO) Take 1 mL by mouth 2 (two) times daily as needed (for cold  symptoms).    diclofenac Sodium (VOLTAREN) 1 % GEL APPLY TO HANDS TWICE A DAY FOR ARTHRITIS PAIN   fexofenadine (ALLEGRA) 180 MG tablet Take 180 mg by mouth as needed.    fluticasone (FLONASE) 50 MCG/ACT nasal spray Place 1 spray into both nostrils at bedtime.    furosemide (LASIX) 20 MG tablet Take one tablet every morning for excess fluid   ibandronate (BONIVA) 150 MG tablet Take 150 mg by mouth every 30 (thirty) days.    losartan (COZAAR) 100 MG tablet Take 100 mg by mouth daily.   montelukast (SINGULAIR) 10 MG tablet Take 10 mg by mouth at bedtime.   Multiple Vitamins-Minerals (CENTRUM SILVER ADULT 50+ PO) Take 1 tablet by mouth daily.   omeprazole (PRILOSEC) 40 MG capsule Take 40 mg by mouth in the morning and at bedtime.    PROAIR HFA 108 (90 Base) MCG/ACT inhaler Inhale 1-2 puffs into the lungs every 6 (six) hours as needed for wheezing or shortness of breath.    sodium chloride (OCEAN) 0.65 % SOLN nasal spray Place 1 spray into both nostrils as needed for congestion.   traMADol (ULTRAM) 50 MG tablet Take 50 mg by mouth as needed.   [DISCONTINUED] Influenza vac split quadrivalent PF (FLUZONE HIGH-DOSE) 0.5 ML injection Fluzone High-Dose 2019-20 (PF) 180 mcg/0.5 mL intramuscular syringe     Allergies:  Codeine and Omnicef [cefdinir]   Social History   Tobacco Use   Smoking status: Passive Smoke Exposure - Never Smoker   Smokeless tobacco: Never Used  Substance Use Topics   Alcohol use: Yes    Alcohol/week: 3.0 standard drinks    Types: 3 Glasses of wine per week   Drug use: No     Family History: The patient's family history includes Hypertension (age of onset: 50) in her mother; Pneumonia (age of onset: 29) in her father.  ROS:   Please see the history of present illness.    All other systems reviewed and are negative.  EKGs/Labs/Other Studies Reviewed:    The following studies were reviewed today:  EKG:  SR, first deg AVB, LVH, ST t wave abnl inferior and  anterolateral.   Recent Labs: No results found for requested labs within last 8760 hours.  Recent Lipid Panel    Component Value Date/Time   CHOL 134 11/14/2015 1004   TRIG 24.0 11/14/2015 1004   HDL 78.70 11/14/2015 1004   CHOLHDL 2 11/14/2015 1004   VLDL 4.8 11/14/2015 1004   LDLCALC 51 11/14/2015 1004    Physical Exam:    VS:  BP 130/82   Pulse 84   Ht 4\' 11"  (1.499 m)   Wt 183 lb 9.6 oz (83.3 kg)   BMI 37.08 kg/m     Wt Readings from Last 5 Encounters:  01/15/21 183 lb 9.6 oz (83.3 kg)  10/16/20 177 lb (80.3 kg)  07/30/20 179 lb (81.2 kg)  05/18/19 189 lb (85.7 kg)  02/05/18 184 lb (83.5 kg)    Constitutional: No acute distress Eyes: sclera non-icteric, normal conjunctiva and lids ENMT: normal dentition, moist mucous membranes Cardiovascular: regular rhythm, normal rate, no murmurs. S1 and S2 normal. Radial pulses normal bilaterally. No jugular venous distention.  Respiratory: clear to auscultation bilaterally GI : normal bowel sounds, soft and nontender. No distention.   MSK: extremities warm, well perfused. No edema.  NEURO: grossly nonfocal exam, moves all extremities. PSYCH: alert and oriented x 3, normal mood and affect.   ASSESSMENT:    1. Palpitations   2. TIA (transient ischemic attack)   3. Dyspnea, unspecified type   4. Essential hypertension   5. OSA (obstructive sleep apnea)    PLAN:    Palpitations - Plan: EKG 12-Lead TIA (transient ischemic attack) - no evidence of afib on monitor or ECG. Continue BB.  Dyspnea, unspecified type - stable, no active issues.  Essential hypertension - BP stable continue current meds.  Total time of encounter: 20 minutes total time of encounter, including 15 minutes spent in face-to-face patient care on the date of this encounter. This time includes coordination of care and counseling regarding above mentioned problem list. Remainder of non-face-to-face time involved reviewing chart documents/testing relevant  to the patient encounter and documentation in the medical record. I have independently reviewed documentation from referring provider.   Cherlynn Kaiser, MD, [facility]    Medication Adjustments/Labs and Tests Ordered: Current medicines are reviewed at length with the patient today.  Concerns regarding medicines are outlined above.   Orders Placed This Encounter  Procedures   EKG 12-Lead    No orders of the defined types were placed in this encounter.   Patient Instructions  Medication Instructions:  No Changes In Medications at this time.  *If you need a refill on your cardiac medications before your next appointment, please call your pharmacy*  Follow-Up: At [facility], you and your health needs are our priority.  As part of our continuing mission to provide you with exceptional heart care, we have created designated Provider Care Teams.  These Care Teams include your primary Cardiologist (physician) and Advanced Practice Providers (APPs -  Physician Assistants and Nurse Practitioners) who all work together to provide you with the care you need, when you need it.  Your next appointment:   6 month(s)  The format for your next appointment:   In Person  Provider:   Cherlynn Kaiser, MD

## 2021-03-06 ENCOUNTER — Encounter (HOSPITAL_COMMUNITY): Payer: Self-pay | Admitting: Emergency Medicine

## 2021-03-06 ENCOUNTER — Emergency Department (HOSPITAL_COMMUNITY): Payer: Medicare HMO

## 2021-03-06 ENCOUNTER — Inpatient Hospital Stay (HOSPITAL_COMMUNITY)
Admission: EM | Admit: 2021-03-06 | Discharge: 2021-03-15 | DRG: 871 | Disposition: A | Discharge status: Home Health | Payer: Medicare HMO | Attending: Internal Medicine | Admitting: Internal Medicine

## 2021-03-06 ENCOUNTER — Inpatient Hospital Stay (HOSPITAL_COMMUNITY): Payer: Medicare HMO

## 2021-03-06 DIAGNOSIS — N170 Acute kidney failure with tubular necrosis: Secondary | ICD-10-CM | POA: Diagnosis present

## 2021-03-06 DIAGNOSIS — E876 Hypokalemia: Secondary | ICD-10-CM | POA: Diagnosis present

## 2021-03-06 DIAGNOSIS — Z8673 Personal history of transient ischemic attack (TIA), and cerebral infarction without residual deficits: Secondary | ICD-10-CM

## 2021-03-06 DIAGNOSIS — G4733 Obstructive sleep apnea (adult) (pediatric): Secondary | ICD-10-CM | POA: Diagnosis present

## 2021-03-06 DIAGNOSIS — E860 Dehydration: Secondary | ICD-10-CM | POA: Diagnosis present

## 2021-03-06 DIAGNOSIS — Z6835 Body mass index (BMI) 35.0-35.9, adult: Secondary | ICD-10-CM | POA: Diagnosis not present

## 2021-03-06 DIAGNOSIS — M21612 Bunion of left foot: Secondary | ICD-10-CM | POA: Diagnosis present

## 2021-03-06 DIAGNOSIS — Z79899 Other long term (current) drug therapy: Secondary | ICD-10-CM | POA: Diagnosis not present

## 2021-03-06 DIAGNOSIS — I10 Essential (primary) hypertension: Secondary | ICD-10-CM | POA: Diagnosis present

## 2021-03-06 DIAGNOSIS — E669 Obesity, unspecified: Secondary | ICD-10-CM | POA: Diagnosis present

## 2021-03-06 DIAGNOSIS — E872 Acidosis: Secondary | ICD-10-CM | POA: Diagnosis present

## 2021-03-06 DIAGNOSIS — R000 Tachycardia, unspecified: Secondary | ICD-10-CM | POA: Diagnosis not present

## 2021-03-06 DIAGNOSIS — Z7982 Long term (current) use of aspirin: Secondary | ICD-10-CM

## 2021-03-06 DIAGNOSIS — R627 Adult failure to thrive: Secondary | ICD-10-CM | POA: Diagnosis present

## 2021-03-06 DIAGNOSIS — I4819 Other persistent atrial fibrillation: Secondary | ICD-10-CM | POA: Diagnosis present

## 2021-03-06 DIAGNOSIS — J45909 Unspecified asthma, uncomplicated: Secondary | ICD-10-CM | POA: Diagnosis present

## 2021-03-06 DIAGNOSIS — K219 Gastro-esophageal reflux disease without esophagitis: Secondary | ICD-10-CM | POA: Diagnosis present

## 2021-03-06 DIAGNOSIS — L02612 Cutaneous abscess of left foot: Secondary | ICD-10-CM | POA: Diagnosis present

## 2021-03-06 DIAGNOSIS — N179 Acute kidney failure, unspecified: Secondary | ICD-10-CM | POA: Diagnosis not present

## 2021-03-06 DIAGNOSIS — G43109 Migraine with aura, not intractable, without status migrainosus: Secondary | ICD-10-CM | POA: Diagnosis present

## 2021-03-06 DIAGNOSIS — A419 Sepsis, unspecified organism: Secondary | ICD-10-CM | POA: Diagnosis not present

## 2021-03-06 DIAGNOSIS — Z20822 Contact with and (suspected) exposure to covid-19: Secondary | ICD-10-CM | POA: Diagnosis present

## 2021-03-06 DIAGNOSIS — L03116 Cellulitis of left lower limb: Secondary | ICD-10-CM | POA: Diagnosis present

## 2021-03-06 DIAGNOSIS — R6520 Severe sepsis without septic shock: Secondary | ICD-10-CM

## 2021-03-06 DIAGNOSIS — Z7951 Long term (current) use of inhaled steroids: Secondary | ICD-10-CM | POA: Diagnosis not present

## 2021-03-06 DIAGNOSIS — L03032 Cellulitis of left toe: Secondary | ICD-10-CM | POA: Diagnosis present

## 2021-03-06 DIAGNOSIS — I480 Paroxysmal atrial fibrillation: Secondary | ICD-10-CM | POA: Diagnosis present

## 2021-03-06 DIAGNOSIS — Z7901 Long term (current) use of anticoagulants: Secondary | ICD-10-CM

## 2021-03-06 DIAGNOSIS — I4891 Unspecified atrial fibrillation: Secondary | ICD-10-CM | POA: Diagnosis not present

## 2021-03-06 DIAGNOSIS — A4102 Sepsis due to Methicillin resistant Staphylococcus aureus: Secondary | ICD-10-CM | POA: Diagnosis present

## 2021-03-06 DIAGNOSIS — L97521 Non-pressure chronic ulcer of other part of left foot limited to breakdown of skin: Secondary | ICD-10-CM | POA: Diagnosis present

## 2021-03-06 DIAGNOSIS — L0390 Cellulitis, unspecified: Secondary | ICD-10-CM | POA: Diagnosis not present

## 2021-03-06 DIAGNOSIS — J189 Pneumonia, unspecified organism: Secondary | ICD-10-CM

## 2021-03-06 HISTORY — DX: Other obesity due to excess calories: E66.09

## 2021-03-06 HISTORY — DX: Other chronic pain: G89.29

## 2021-03-06 HISTORY — DX: Obstructive sleep apnea (adult) (pediatric): G47.33

## 2021-03-06 HISTORY — DX: Other obesity due to excess calories: E66.812

## 2021-03-06 LAB — CBC WITH DIFFERENTIAL/PLATELET
Abs Immature Granulocytes: 0.26 10*3/uL — ABNORMAL HIGH (ref 0.00–0.07)
Basophils Absolute: 0 10*3/uL (ref 0.0–0.1)
Basophils Relative: 0 %
Eosinophils Absolute: 0.2 10*3/uL (ref 0.0–0.5)
Eosinophils Relative: 1 %
HCT: 45.9 % (ref 36.0–46.0)
Hemoglobin: 15 g/dL (ref 12.0–15.0)
Immature Granulocytes: 2 %
Lymphocytes Relative: 3 %
Lymphs Abs: 0.6 10*3/uL — ABNORMAL LOW (ref 0.7–4.0)
MCH: 28.8 pg (ref 26.0–34.0)
MCHC: 32.7 g/dL (ref 30.0–36.0)
MCV: 88.3 fL (ref 80.0–100.0)
Monocytes Absolute: 0.2 10*3/uL (ref 0.1–1.0)
Monocytes Relative: 1 %
Neutro Abs: 16.5 10*3/uL — ABNORMAL HIGH (ref 1.7–7.7)
Neutrophils Relative %: 93 %
Platelets: 185 10*3/uL (ref 150–400)
RBC: 5.2 MIL/uL — ABNORMAL HIGH (ref 3.87–5.11)
RDW: 14.7 % (ref 11.5–15.5)
WBC: 17.7 10*3/uL — ABNORMAL HIGH (ref 4.0–10.5)
nRBC: 0 % (ref 0.0–0.2)

## 2021-03-06 LAB — CBC
HCT: 23.8 % — ABNORMAL LOW (ref 36.0–46.0)
Hemoglobin: 7.6 g/dL — ABNORMAL LOW (ref 12.0–15.0)
MCH: 29.7 pg (ref 26.0–34.0)
MCHC: 31.9 g/dL (ref 30.0–36.0)
MCV: 93 fL (ref 80.0–100.0)
Platelets: 73 10*3/uL — ABNORMAL LOW (ref 150–400)
RBC: 2.56 MIL/uL — ABNORMAL LOW (ref 3.87–5.11)
RDW: 14.7 % (ref 11.5–15.5)
WBC: 9.7 10*3/uL (ref 4.0–10.5)
nRBC: 0 % (ref 0.0–0.2)

## 2021-03-06 LAB — PROTIME-INR
INR: 1.2 (ref 0.8–1.2)
Prothrombin Time: 15.6 s — ABNORMAL HIGH (ref 11.4–15.2)

## 2021-03-06 LAB — RESP PANEL BY RT-PCR (FLU A&B, COVID) ARPGX2
Influenza A by PCR: NEGATIVE
Influenza B by PCR: NEGATIVE
SARS Coronavirus 2 by RT PCR: NEGATIVE

## 2021-03-06 LAB — COMPREHENSIVE METABOLIC PANEL
ALT: 23 U/L (ref 0–44)
AST: 26 U/L (ref 15–41)
Albumin: 3 g/dL — ABNORMAL LOW (ref 3.5–5.0)
Alkaline Phosphatase: 49 U/L (ref 38–126)
Anion gap: 18 — ABNORMAL HIGH (ref 5–15)
BUN: 55 mg/dL — ABNORMAL HIGH (ref 8–23)
CO2: 19 mmol/L — ABNORMAL LOW (ref 22–32)
Calcium: 8.2 mg/dL — ABNORMAL LOW (ref 8.9–10.3)
Chloride: 99 mmol/L (ref 98–111)
Creatinine, Ser: 3.41 mg/dL — ABNORMAL HIGH (ref 0.44–1.00)
GFR, Estimated: 13 mL/min — ABNORMAL LOW (ref >60)
Glucose, Bld: 135 mg/dL — ABNORMAL HIGH (ref 70–99)
Potassium: 3.4 mmol/L — ABNORMAL LOW (ref 3.5–5.1)
Sodium: 136 mmol/L (ref 135–145)
Total Bilirubin: 1.2 mg/dL (ref 0.3–1.2)
Total Protein: 6.3 g/dL — ABNORMAL LOW (ref 6.5–8.1)

## 2021-03-06 LAB — LACTIC ACID, PLASMA
Lactic Acid, Venous: 2.2 mmol/L (ref 0.5–1.9)
Lactic Acid, Venous: 2.3 mmol/L (ref 0.5–1.9)
Lactic Acid, Venous: 2.7 mmol/L (ref 0.5–1.9)

## 2021-03-06 LAB — C-REACTIVE PROTEIN: CRP: 26.6 mg/dL — ABNORMAL HIGH (ref <1.0)

## 2021-03-06 LAB — PROCALCITONIN: Procalcitonin: 12.36 ng/mL

## 2021-03-06 LAB — PREALBUMIN: Prealbumin: 7 mg/dL — ABNORMAL LOW (ref 18–38)

## 2021-03-06 LAB — APTT: aPTT: 21 s — ABNORMAL LOW (ref 24–36)

## 2021-03-06 MED ORDER — VANCOMYCIN HCL 1500 MG/300ML IV SOLN
1500.0000 mg | Freq: Once | INTRAVENOUS | Status: AC
  Administered 2021-03-06: 1500 mg via INTRAVENOUS
  Filled 2021-03-06: qty 300

## 2021-03-06 MED ORDER — LACTATED RINGERS IV BOLUS (SEPSIS)
1000.0000 mL | Freq: Once | INTRAVENOUS | Status: AC
  Administered 2021-03-06: 1000 mL via INTRAVENOUS

## 2021-03-06 MED ORDER — SODIUM CHLORIDE 0.9 % IV SOLN
2.0000 g | INTRAVENOUS | Status: DC
  Administered 2021-03-06 – 2021-03-08 (×3): 2 g via INTRAVENOUS
  Filled 2021-03-06 (×3): qty 20

## 2021-03-06 MED ORDER — VANCOMYCIN HCL 1000 MG/200ML IV SOLN: 1000.0000 mg | Freq: Once | INTRAVENOUS | Status: DC

## 2021-03-06 MED ORDER — ONDANSETRON HCL 4 MG PO TABS
4.0000 mg | ORAL_TABLET | Freq: Four times a day (QID) | ORAL | Status: DC | PRN
  Administered 2021-03-13 – 2021-03-14 (×2): 4 mg via ORAL
  Filled 2021-03-06 (×2): qty 1

## 2021-03-06 MED ORDER — DILTIAZEM HCL-DEXTROSE 125-5 MG/125ML-% IV SOLN (PREMIX)
5.0000 mg/h | INTRAVENOUS | Status: DC
Start: 2021-03-06 — End: 2021-03-08
  Administered 2021-03-07: 5 mg/h via INTRAVENOUS
  Filled 2021-03-06 (×2): qty 125

## 2021-03-06 MED ORDER — MAGNESIUM SULFATE 2 GM/50ML IV SOLN
2.0000 g | Freq: Once | INTRAVENOUS | Status: AC
  Administered 2021-03-06: 2 g via INTRAVENOUS
  Filled 2021-03-06: qty 50

## 2021-03-06 MED ORDER — DILTIAZEM LOAD VIA INFUSION
20.0000 mg | Freq: Once | INTRAVENOUS | Status: AC
  Administered 2021-03-07: 20 mg via INTRAVENOUS
  Filled 2021-03-06: qty 20

## 2021-03-06 MED ORDER — HEPARIN (PORCINE) 25000 UT/250ML-% IV SOLN
1800.0000 [IU]/h | INTRAVENOUS | Status: DC
  Administered 2021-03-06: 750 [IU]/h via INTRAVENOUS
  Administered 2021-03-08: 1450 [IU]/h via INTRAVENOUS
  Administered 2021-03-09 – 2021-03-11 (×5): 1800 [IU]/h via INTRAVENOUS
  Filled 2021-03-06 (×4): qty 250
  Filled 2021-03-06: qty 500
  Filled 2021-03-06 (×5): qty 250

## 2021-03-06 MED ORDER — PANTOPRAZOLE SODIUM 40 MG PO TBEC
40.0000 mg | DELAYED_RELEASE_TABLET | Freq: Two times a day (BID) | ORAL | Status: DC
  Administered 2021-03-06 – 2021-03-15 (×18): 40 mg via ORAL
  Filled 2021-03-06 (×18): qty 1

## 2021-03-06 MED ORDER — TRAMADOL HCL 50 MG PO TABS
50.0000 mg | ORAL_TABLET | Freq: Four times a day (QID) | ORAL | Status: DC | PRN
Start: 2021-03-06 — End: 2021-03-15
  Administered 2021-03-06 – 2021-03-15 (×15): 50 mg via ORAL
  Filled 2021-03-06 (×15): qty 1

## 2021-03-06 MED ORDER — SODIUM CHLORIDE 0.9% FLUSH
3.0000 mL | Freq: Two times a day (BID) | INTRAVENOUS | Status: DC
  Administered 2021-03-06 – 2021-03-15 (×15): 3 mL via INTRAVENOUS

## 2021-03-06 MED ORDER — LACTATED RINGERS IV BOLUS (SEPSIS)
500.0000 mL | Freq: Once | INTRAVENOUS | Status: AC
  Administered 2021-03-06: 500 mL via INTRAVENOUS

## 2021-03-06 MED ORDER — LACTATED RINGERS IV SOLN: INTRAVENOUS | Status: DC

## 2021-03-06 MED ORDER — LACTATED RINGERS IV BOLUS: 1000.0000 mL | Freq: Once | INTRAVENOUS | Status: DC

## 2021-03-06 MED ORDER — ACETAMINOPHEN 325 MG PO TABS
650.0000 mg | ORAL_TABLET | Freq: Four times a day (QID) | ORAL | Status: DC | PRN
  Administered 2021-03-10: 650 mg via ORAL
  Filled 2021-03-06: qty 2

## 2021-03-06 MED ORDER — MOMETASONE FURO-FORMOTEROL FUM 200-5 MCG/ACT IN AERO
2.0000 | INHALATION_SPRAY | Freq: Two times a day (BID) | RESPIRATORY_TRACT | Status: DC
  Administered 2021-03-07 – 2021-03-15 (×17): 2 via RESPIRATORY_TRACT
  Filled 2021-03-06: qty 8.8

## 2021-03-06 MED ORDER — VANCOMYCIN VARIABLE DOSE PER UNSTABLE RENAL FUNCTION (PHARMACIST DOSING): Status: DC

## 2021-03-06 MED ORDER — ACETAMINOPHEN 650 MG RE SUPP: 650.0000 mg | Freq: Four times a day (QID) | RECTAL | Status: DC | PRN

## 2021-03-06 MED ORDER — ONDANSETRON HCL 4 MG/2ML IJ SOLN
4.0000 mg | Freq: Four times a day (QID) | INTRAMUSCULAR | Status: DC | PRN
  Administered 2021-03-06 – 2021-03-11 (×3): 4 mg via INTRAVENOUS
  Filled 2021-03-06 (×3): qty 2

## 2021-03-06 MED ORDER — AMIODARONE HCL IN DEXTROSE 360-4.14 MG/200ML-% IV SOLN
60.0000 mg/h | INTRAVENOUS | Status: AC
  Administered 2021-03-06: 60 mg/h via INTRAVENOUS
  Filled 2021-03-06: qty 200

## 2021-03-06 MED ORDER — HEPARIN BOLUS VIA INFUSION
3500.0000 [IU] | Freq: Once | INTRAVENOUS | Status: AC
  Administered 2021-03-06: 3500 [IU] via INTRAVENOUS
  Filled 2021-03-06: qty 3500

## 2021-03-06 MED ORDER — MONTELUKAST SODIUM 10 MG PO TABS
10.0000 mg | ORAL_TABLET | Freq: Every day | ORAL | Status: DC
  Administered 2021-03-06 – 2021-03-14 (×9): 10 mg via ORAL
  Filled 2021-03-06 (×10): qty 1

## 2021-03-06 MED ORDER — POTASSIUM CHLORIDE 10 MEQ/100ML IV SOLN
10.0000 meq | INTRAVENOUS | Status: AC
  Administered 2021-03-06 (×3): 10 meq via INTRAVENOUS
  Filled 2021-03-06 (×3): qty 100

## 2021-03-06 MED ORDER — ALBUTEROL SULFATE (2.5 MG/3ML) 0.083% IN NEBU
3.0000 mL | INHALATION_SOLUTION | Freq: Four times a day (QID) | RESPIRATORY_TRACT | Status: DC | PRN
  Administered 2021-03-07 (×3): 3 mL via RESPIRATORY_TRACT
  Filled 2021-03-06 (×3): qty 3

## 2021-03-06 MED ORDER — SODIUM CHLORIDE 0.9 % IV SOLN: 2.0000 g | Freq: Once | INTRAVENOUS | Status: DC

## 2021-03-06 MED ORDER — SODIUM CHLORIDE 0.9 % IV BOLUS
1000.0000 mL | Freq: Once | INTRAVENOUS | Status: AC
  Administered 2021-03-06: 1000 mL via INTRAVENOUS

## 2021-03-06 MED ORDER — LACTATED RINGERS IV BOLUS (SEPSIS)
1000.0000 mL | Freq: Once | INTRAVENOUS | Status: AC
Start: 2021-03-06 — End: 2021-03-06
  Administered 2021-03-06: 1000 mL via INTRAVENOUS

## 2021-03-06 MED ORDER — AMIODARONE HCL IN DEXTROSE 360-4.14 MG/200ML-% IV SOLN
30.0000 mg/h | INTRAVENOUS | Status: DC
  Administered 2021-03-06 – 2021-03-11 (×10): 30 mg/h via INTRAVENOUS
  Filled 2021-03-06 (×11): qty 200

## 2021-03-06 NOTE — Progress Notes (Signed)
ANTICOAGULATION CONSULT NOTE - Initial Consult  Pharmacy Consult for heparin Indication: atrial fibrillation  Allergies  Allergen Reactions  . Codeine Rash and Other (See Comments)    Agitation, bad dreams  . Omnicef [Cefdinir] Nausea And Vomiting    Confirmed with patient at bedside that no rash - just had significant GI effects    Patient Measurements: Height: 4\' 11"  (149.9 cm) Weight: 79.4 kg (175 lb) IBW/kg (Calculated) : 43.2 Heparin Dosing Weight: 61 kg   Vital Signs: Temp: 98.4 F (36.9 C) (05/25 1556) Temp Source: Oral (05/25 1134) BP: 151/72 (05/25 1645) Pulse Rate: 108 (05/25 1700)  Labs: Recent Labs    03/06/21 1133 03/06/21 1201 03/06/21 1305  HGB 7.6*  --  15.0  HCT 23.8*  --  45.9  PLT 73*  --  185  APTT  --  21*  --   LABPROT  --  15.6*  --   INR  --  1.2  --   CREATININE  --   --  3.41*    Estimated Creatinine Clearance: 12 mL/min (A) (by C-G formula based on SCr of 3.41 mg/dL (H)).   Medical History: Past Medical History:  Diagnosis Date  . Arthritis   . Asthma   . Hypertension     Medications:  (Not in a hospital admission)   Assessment: 46 YOF who presented with Afib to start IV heparin. Not currently on anticoagulation.   H/H and Plt wnl. SCr 3.41  Goal of Therapy:  Heparin level 0.3-0.7 units/ml Monitor platelets by anticoagulation protocol: Yes   Plan:  -Heparin 3500 units IV bolus followed by heparin infusion at 750 units/hr  -F/u 8 hr HL -Monitor daily HL, CBC and s/s of bleeding  Albertina Parr, PharmD., BCPS, BCCCP Clinical Pharmacist Please refer to [facility] for unit-specific pharmacist

## 2021-03-06 NOTE — Consult Note (Signed)
Reason for Consult: Cellulitis, sepsis Referring Physician: Dr. Karmen Bongo, MD  Alisha Terrell is an 80 y.o. female.  HPI: 80 year old female with past medical history significant for TIA, OSA, presented to the emergency department dehydration, AKI with elevated lactate A. fib with RVR found to have swelling or redness of her toes and possible sepsis.  Podiatry was consulted for cellulitis, large blister on the left foot.  Patient states that she noticed last week that the toe started become sore.  On Sunday symptoms seem to worsen in the progress since then.  EMS was called today where she was brought to the emergency department.  She states that she wears a brace on the left ankle and she is worn the same shoe.  Denies any recent injury or trauma to her foot.  No change in activity level that she reports when this started.  Past Medical History:  Diagnosis Date  . Arthritis   . Asthma   . Hypertension     Past Surgical History:  Procedure Laterality Date  . ABDOMINAL SURGERY    . CESAREAN SECTION    . CHOLECYSTECTOMY      Family History  Problem Relation Age of Onset  . Hypertension Mother 45  . Pneumonia Father 39    Social History:  reports that she is a non-smoker but has been exposed to tobacco smoke. She has never used smokeless tobacco. She reports current alcohol use of about 3.0 standard drinks of alcohol per week. She reports that she does not use drugs.  Allergies:  Allergies  Allergen Reactions  . Codeine Rash and Other (See Comments)    Agitation, bad dreams  . Omnicef [Cefdinir] Nausea And Vomiting    Confirmed with patient at bedside that no rash - just had significant GI effects    Medications: I have reviewed the patient's current medications.  Results for orders placed or performed during the hospital encounter of 03/06/21 (from the past 48 hour(s))  CBC     Status: Abnormal   Collection Time: 03/06/21 11:33 AM  Result Value Ref Range   WBC 9.7  4.0 - 10.5 K/uL   RBC 2.56 (L) 3.87 - 5.11 MIL/uL   Hemoglobin 7.6 (L) 12.0 - 15.0 g/dL   HCT 23.8 (L) 36.0 - 46.0 %   MCV 93.0 80.0 - 100.0 fL   MCH 29.7 26.0 - 34.0 pg   MCHC 31.9 30.0 - 36.0 g/dL   RDW 14.7 11.5 - 15.5 %   Platelets 73 (L) 150 - 400 K/uL    Comment: Immature Platelet Fraction may be clinically indicated, consider ordering this additional test [id number] REPEATED TO VERIFY    nRBC 0.0 0.0 - 0.2 %    Comment: Performed at [facility], [place] [address], [place], [place] [address]  Lactic acid, plasma     Status: Abnormal   Collection Time: 03/06/21 12:01 PM  Result Value Ref Range   Lactic Acid, Venous 2.2 (HH) 0.5 - 1.9 mmol/L    Comment: CRITICAL RESULT CALLED TO, READ BACK BY AND VERIFIED WITH: LYON,J RN @ 1327 03/06/21 LEONARD,A Performed at [facility], [place] [address] [place], [place] [address]   Protime-INR     Status: Abnormal   Collection Time: 03/06/21 12:01 PM  Result Value Ref Range   Prothrombin Time 15.6 (H) 11.4 - 15.2 seconds   INR 1.2 0.8 - 1.2    Comment: (NOTE) INR goal varies based on device and disease states. Performed  at [facility], [place] [address], [place], [place] [address]   APTT     Status: Abnormal   Collection Time: 03/06/21 12:01 PM  Result Value Ref Range   aPTT 21 (L) 24 - 36 seconds    Comment: Performed at [place] [address], [place], [place] [address]  Comprehensive metabolic panel     Status: Abnormal   Collection Time: 03/06/21  1:05 PM  Result Value Ref Range   Sodium 136 135 - 145 mmol/L   Potassium 3.4 (L) 3.5 - 5.1 mmol/L   Chloride 99 98 - 111 mmol/L   CO2 19 (L) 22 - 32 mmol/L   Glucose, Bld 135 (H) 70 - 99 mg/dL    Comment: Glucose reference range applies only to samples taken after fasting for at least 8 hours.   BUN 55 (H) 8 - 23 mg/dL   Creatinine, Ser 3.41 (H) 0.44 - 1.00 mg/dL   Calcium 8.2 (L) 8.9 - 10.3 mg/dL   Total Protein 6.3 (L) 6.5 - 8.1 g/dL   Albumin 3.0 (L)  3.5 - 5.0 g/dL   AST 26 15 - 41 U/L   ALT 23 0 - 44 U/L   Alkaline Phosphatase 49 38 - 126 U/L   Total Bilirubin 1.2 0.3 - 1.2 mg/dL   GFR, Estimated 13 (L) >60 mL/min    Comment: (NOTE) Calculated using the CKD-EPI Creatinine Equation (2021)    Anion gap 18 (H) 5 - 15    Comment: Performed at [facility], [place] [address], [place], [place] [address]  CBC with Differential     Status: Abnormal   Collection Time: 03/06/21  1:05 PM  Result Value Ref Range   WBC 17.7 (H) 4.0 - 10.5 K/uL   RBC 5.20 (H) 3.87 - 5.11 MIL/uL   Hemoglobin 15.0 12.0 - 15.0 g/dL    Comment: REPEATED TO VERIFY RESULTS VERIFIED VIA RECOLLECT NOTIFIED J LYONS RN 03/06/21 215P BY PGRAHAM    HCT 45.9 36.0 - 46.0 %   MCV 88.3 80.0 - 100.0 fL   MCH 28.8 26.0 - 34.0 pg   MCHC 32.7 30.0 - 36.0 g/dL   RDW 14.7 11.5 - 15.5 %   Platelets 185 150 - 400 K/uL    Comment: REPEATED TO VERIFY DELTA CHECK NOTED    nRBC 0.0 0.0 - 0.2 %   Neutrophils Relative % 93 %   Neutro Abs 16.5 (H) 1.7 - 7.7 K/uL   Lymphocytes Relative 3 %   Lymphs Abs 0.6 (L) 0.7 - 4.0 K/uL   Monocytes Relative 1 %   Monocytes Absolute 0.2 0.1 - 1.0 K/uL   Eosinophils Relative 1 %   Eosinophils Absolute 0.2 0.0 - 0.5 K/uL   Basophils Relative 0 %   Basophils Absolute 0.0 0.0 - 0.1 K/uL   Immature Granulocytes 2 %   Abs Immature Granulocytes 0.26 (H) 0.00 - 0.07 K/uL    Comment: Performed at [facility], [address] [place], [place] [address]  Resp Panel by RT-PCR (Flu A&B, Covid) Nasopharyngeal Swab     Status: None   Collection Time: 03/06/21  2:23 PM   Specimen: Nasopharyngeal Swab; Nasopharyngeal(NP) swabs in vial transport medium  Result Value Ref Range   SARS Coronavirus 2 by RT PCR NEGATIVE NEGATIVE    Comment: (NOTE) SARS-CoV-2 target nucleic acids are NOT DETECTED.  The SARS-CoV-2 RNA is generally detectable in upper respiratory specimens during the acute phase of  infection. The lowest concentration of SARS-CoV-2  viral copies this assay can detect is 138 copies/mL. A negative result does not preclude SARS-Cov-2 infection and should not be used as the sole basis for treatment or other patient management decisions. A negative result may occur with  improper specimen collection/handling, submission of specimen other than nasopharyngeal swab, presence of viral mutation(s) within the areas targeted by this assay, and inadequate number of viral copies(<138 copies/mL). A negative result must be combined with clinical observations, patient history, and epidemiological information. The expected result is Negative.  Fact Sheet for Patients:  EntrepreneurPulse.com.au  Fact Sheet for Healthcare Providers:  IncredibleEmployment.be  This test is no t yet approved or cleared by the [place] FDA and  has been authorized for detection and/or diagnosis of SARS-CoV-2 by FDA under an Emergency Use Authorization (EUA). This EUA will remain  in effect (meaning this test can be used) for the duration of the COVID-19 declaration under Section 564(b)(1) of the Act, 21 U.S.C.section 360bbb-3(b)(1), unless the authorization is terminated  or revoked sooner.       Influenza A by PCR NEGATIVE NEGATIVE   Influenza B by PCR NEGATIVE NEGATIVE    Comment: (NOTE) The Xpert Xpress SARS-CoV-2/FLU/RSV plus assay is intended as an aid in the diagnosis of influenza from Nasopharyngeal swab specimens and should not be used as a sole basis for treatment. Nasal washings and aspirates are unacceptable for Xpert Xpress SARS-CoV-2/FLU/RSV testing.  Fact Sheet for Patients: EntrepreneurPulse.com.au  Fact Sheet for Healthcare Providers: IncredibleEmployment.be  This test is not yet approved or cleared by the [place] FDA and has been authorized for detection and/or diagnosis of SARS-CoV-2 by FDA under an Emergency Use Authorization (EUA). This EUA  will remain in effect (meaning this test can be used) for the duration of the COVID-19 declaration under Section 564(b)(1) of the Act, 21 U.S.C. section 360bbb-3(b)(1), unless the authorization is terminated or revoked.  Performed at [facility], [place] [address] [place], [place] [address]     DG Chest Port 1 View  Result Date: 03/06/2021 CLINICAL DATA:  Atrial fibrillation EXAM: PORTABLE CHEST 1 VIEW COMPARISON:  06/13/2020 FINDINGS: Low lung volumes. Probable central bronchiectasis. Mild interstitial prominence. No significant pleural effusion. No pneumothorax. Cardiomediastinal contours are likely within normal limits for technique. IMPRESSION: Mild interstitial prominence, which may reflect chronic changes or edema. Electronically Signed   By: Macy Mis M.D.   On: 03/06/2021 12:53    Review of Systems Blood pressure (!) 151/72, pulse 64, temperature 98.4 F (36.9 C), resp. rate (!) 31, height 4\' 11"  (1.499 m), weight 79.4 kg, SpO2 93 %. Physical Exam General: AAO x3-nauseous during the visit today.  Dermatological: Large bulla present on the left second toe with edema erythema to the toe and surrounding erythema with mild streaking of the foot.  There is warmth of the foot.  Upon drainage of the blister extensive purulence was identified.  I debrided the tissue and underlying skin intact without any deep ulceration only superficial ulcers identified.      Vascular: Dorsalis Pedis artery and Posterior Tibial artery pedal pulses are 2/4 bilateral with immedate capillary fill time.   Neruologic: Sensation decreased.  Musculoskeletal: Tenderness of the second toe along the bulla but no other areas of discomfort.  Assessment/Plan: Superficial abscess left second toe, cellulitis with sepsis  White blood cell count is elevated to 17.7 with a lactic acid level of 2.3 and creatinine up to 3.41.  Obvious infection noted to  the left foot.  With verbal consent I cleaned the  foot with Betadine and I utilized an 18-gauge needle in order to drain the blister.  Extensive purulence was identified in the culture was given to the nurse.  X-rays ordered.  Continue IV antibiotics, elevation with close monitoring.  X-rays were negative.  If symptoms continue recommend MRI.  Podiatry will continue to follow  Trula Slade 03/06/2021, 5:00 PM

## 2021-03-06 NOTE — Consult Note (Addendum)
CONSULTATION NOTE   Patient Name: Alisha Terrell Date of Encounter: 03/06/2021 Cardiologist: Elouise Munroe, MD Electrophysiologist: None Advanced Heart Failure: None   Chief Complaint   Weakness, nausea, vomiting  Patient Profile   80 yo female with history of asthma, TIA, OSA, presented to the ER with dehydration and AKI, with elevated lactate and afib with RVR - found to have swelling and redness of her toes and possible sepsis. Cardiology asked to evaluate for afib.  HPI   Alisha Terrell is a 80 y.o. female who is being seen today for the evaluation of afib at the request of Dr. Lorin Mercy. This is an 80 year old female who had established with Dr. Margaretann Loveless, with a history of OSA, asthma, TIA, palpitations and hypertension who presents to the ER with several days of progressive weakness, nausea, vomiting and failure to thrive, found to have elevated lactate, acute renal failure and A. fib with RVR.  In addition she has swelling and redness of her toes including a large bullae on her left foot which was currently being drained by Dr. Jacqualyn Posey in the emergency department.  On my arrival she was in A. fib with RVR and had been started on amiodarone per the critical care service.  He had recently seen Dr. Margaretann Loveless in April 2022 for palpitations, but had also previously been seen for TIA.  She had worn monitors in the office which showed about 20 to 30% burden of bradycardia but no evidence for A. fib.  She has not been anticoagulated.  Her CHA2DS2-VASc score is 5.  Currently she denies chest pain or worsening shortness of breath but is nauseated and did vomit when I was in the room.  PMHx   Past Medical History:  Diagnosis Date  . Arthritis   . Asthma   . Hypertension     Past Surgical History:  Procedure Laterality Date  . ABDOMINAL SURGERY    . CESAREAN SECTION    . CHOLECYSTECTOMY      FAMHx   Family History  Problem Relation Age of Onset  . Hypertension Mother 27   . Pneumonia Father 80    SOCHx    reports that she is a non-smoker but has been exposed to tobacco smoke. She has never used smokeless tobacco. She reports current alcohol use of about 3.0 standard drinks of alcohol per week. She reports that she does not use drugs.  Outpatient Medications   No current facility-administered medications on file prior to encounter.   Current Outpatient Medications on File Prior to Encounter  Medication Sig Dispense Refill  . ADVAIR DISKUS 250-50 MCG/DOSE AEPB Inhale 1 puff into the lungs 2 (two) times daily.     Marland Kitchen amLODipine (NORVASC) 5 MG tablet Take 5 mg by mouth daily.    Marland Kitchen aspirin EC 81 MG tablet Take 81 mg by mouth daily.    Marland Kitchen atenolol (TENORMIN) 50 MG tablet Take 50 mg by mouth 2 (two) times daily.    Marland Kitchen azelastine (ASTELIN) 0.1 % nasal spray Place into the nose.    Marland Kitchen CALCIUM-VITAMIN D PO Take 1 tablet by mouth daily.    . celecoxib (CELEBREX) 200 MG capsule Take 200 mg by mouth every morning.     Marland Kitchen Dextromethorphan-Guaifenesin (ROBITUSSIN COUGH/CHEST DM MAX PO) Take 1 mL by mouth 2 (two) times daily as needed (for cold symptoms).     . diclofenac Sodium (VOLTAREN) 1 % GEL APPLY TO HANDS TWICE A DAY FOR ARTHRITIS PAIN    .  fexofenadine (ALLEGRA) 180 MG tablet Take 180 mg by mouth as needed.     . fluticasone (FLONASE) 50 MCG/ACT nasal spray Place 1 spray into both nostrils at bedtime.     . furosemide (LASIX) 20 MG tablet Take one tablet every morning for excess fluid    . ibandronate (BONIVA) 150 MG tablet Take 150 mg by mouth every 30 (thirty) days.     Marland Kitchen losartan (COZAAR) 100 MG tablet Take 100 mg by mouth daily.    . montelukast (SINGULAIR) 10 MG tablet Take 10 mg by mouth at bedtime.    . Multiple Vitamins-Minerals (CENTRUM SILVER ADULT 50+ PO) Take 1 tablet by mouth daily.    Marland Kitchen omeprazole (PRILOSEC) 40 MG capsule Take 40 mg by mouth in the morning and at bedtime.     Marland Kitchen PROAIR HFA 108 (90 Base) MCG/ACT inhaler Inhale 1-2 puffs into the lungs  every 6 (six) hours as needed for wheezing or shortness of breath.     . sodium chloride (OCEAN) 0.65 % SOLN nasal spray Place 1 spray into both nostrils as needed for congestion.    . traMADol (ULTRAM) 50 MG tablet Take 50 mg by mouth as needed.      Inpatient Medications    Scheduled Meds: . vancomycin variable dose per unstable renal function (pharmacist dosing)   Does not apply See admin instructions    Continuous Infusions: . amiodarone 60 mg/hr (03/06/21 1633)  . amiodarone    . cefTRIAXone (ROCEPHIN)  IV Stopped (03/06/21 1419)  . lactated ringers    . magnesium sulfate bolus IVPB      PRN Meds:    ALLERGIES   Allergies  Allergen Reactions  . Codeine Rash and Other (See Comments)    Agitation, bad dreams  . Omnicef [Cefdinir] Nausea And Vomiting    Confirmed with patient at bedside that no rash - just had significant GI effects    ROS   Pertinent items noted in HPI and remainder of comprehensive ROS otherwise negative.  Vitals   Vitals:   03/06/21 1530 03/06/21 1545 03/06/21 1556 03/06/21 1645  BP: 104/79   (!) 151/72  Pulse: (!) 141 82  64  Resp: (!) 26 (!) 25  (!) 31  Temp:   98.4 F (36.9 C)   TempSrc:      SpO2: 98% 97%  93%  Weight:      Height:        Intake/Output Summary (Last 24 hours) at 03/06/2021 1653 Last data filed at 03/06/2021 1632 Gross per 24 hour  Intake 2600 ml  Output --  Net 2600 ml   Filed Weights   03/06/21 1149  Weight: 79.4 kg    Physical Exam   General appearance: alert, mild distress and toxic Neck: no carotid bruit, no JVD and thyroid not enlarged, symmetric, no tenderness/mass/nodules Lungs: diminished breath sounds bilaterally Heart: irregularly irregular rhythm and Tachycardic Abdomen: soft, non-tender; bowel sounds normal; no masses,  no organomegaly Extremities: extremities normal, atraumatic, no cyanosis or edema Pulses: 2+ and symmetric Skin: Skin color, texture, turgor normal. No rashes or  lesions Neurologic: Mental status: Alert, oriented, thought content appropriate Psych: Pleasant  Labs   Results for orders placed or performed during the hospital encounter of 03/06/21 (from the past 48 hour(s))  CBC     Status: Abnormal   Collection Time: 03/06/21 11:33 AM  Result Value Ref Range   WBC 9.7 4.0 - 10.5 K/uL   RBC 2.56 (L) 3.87 - 5.11  MIL/uL   Hemoglobin 7.6 (L) 12.0 - 15.0 g/dL   HCT 23.8 (L) 36.0 - 46.0 %   MCV 93.0 80.0 - 100.0 fL   MCH 29.7 26.0 - 34.0 pg   MCHC 31.9 30.0 - 36.0 g/dL   RDW 14.7 11.5 - 15.5 %   Platelets 73 (L) 150 - 400 K/uL    Comment: Immature Platelet Fraction may be clinically indicated, consider ordering this additional test [id number] REPEATED TO VERIFY    nRBC 0.0 0.0 - 0.2 %    Comment: Performed at [facility], [place] [address], [place], [place] [address]  Lactic acid, plasma     Status: Abnormal   Collection Time: 03/06/21 12:01 PM  Result Value Ref Range   Lactic Acid, Venous 2.2 (HH) 0.5 - 1.9 mmol/L    Comment: CRITICAL RESULT CALLED TO, READ BACK BY AND VERIFIED WITH: LYON,J RN @ 1327 03/06/21 LEONARD,A Performed at [facility], [place] [address], [place], [place] [address]   Protime-INR     Status: Abnormal   Collection Time: 03/06/21 12:01 PM  Result Value Ref Range   Prothrombin Time 15.6 (H) 11.4 - 15.2 seconds   INR 1.2 0.8 - 1.2    Comment: (NOTE) INR goal varies based on device and disease states. Performed at [facility], [place] [address] [place], [place] [address]   APTT     Status: Abnormal   Collection Time: 03/06/21 12:01 PM  Result Value Ref Range   aPTT 21 (L) 24 - 36 seconds    Comment: Performed at [facility] [address] [place], [place] [address]  Comprehensive metabolic panel     Status: Abnormal   Collection Time: 03/06/21  1:05 PM  Result Value Ref Range   Sodium 136 135 - 145 mmol/L   Potassium 3.4 (L) 3.5 - 5.1 mmol/L   Chloride 99 98 - 111 mmol/L   CO2 19 (L) 22 -  32 mmol/L   Glucose, Bld 135 (H) 70 - 99 mg/dL    Comment: Glucose reference range applies only to samples taken after fasting for at least 8 hours.   BUN 55 (H) 8 - 23 mg/dL   Creatinine, Ser 3.41 (H) 0.44 - 1.00 mg/dL   Calcium 8.2 (L) 8.9 - 10.3 mg/dL   Total Protein 6.3 (L) 6.5 - 8.1 g/dL   Albumin 3.0 (L) 3.5 - 5.0 g/dL   AST 26 15 - 41 U/L   ALT 23 0 - 44 U/L   Alkaline Phosphatase 49 38 - 126 U/L   Total Bilirubin 1.2 0.3 - 1.2 mg/dL   GFR, Estimated 13 (L) >60 mL/min    Comment: (NOTE) Calculated using the CKD-EPI Creatinine Equation (2021)    Anion gap 18 (H) 5 - 15    Comment: Performed at [facility], [place] [address], [place], [place] [address]  CBC with Differential     Status: Abnormal   Collection Time: 03/06/21  1:05 PM  Result Value Ref Range   WBC 17.7 (H) 4.0 - 10.5 K/uL   RBC 5.20 (H) 3.87 - 5.11 MIL/uL   Hemoglobin 15.0 12.0 - 15.0 g/dL    Comment: REPEATED TO VERIFY RESULTS VERIFIED VIA RECOLLECT NOTIFIED J LYONS RN 03/06/21 215P BY PGRAHAM    HCT 45.9 36.0 - 46.0 %   MCV 88.3 80.0 - 100.0 fL   MCH 28.8 26.0 - 34.0 pg   MCHC 32.7 30.0 - 36.0 g/dL  RDW 14.7 11.5 - 15.5 %   Platelets 185 150 - 400 K/uL    Comment: REPEATED TO VERIFY DELTA CHECK NOTED    nRBC 0.0 0.0 - 0.2 %   Neutrophils Relative % 93 %   Neutro Abs 16.5 (H) 1.7 - 7.7 K/uL   Lymphocytes Relative 3 %   Lymphs Abs 0.6 (L) 0.7 - 4.0 K/uL   Monocytes Relative 1 %   Monocytes Absolute 0.2 0.1 - 1.0 K/uL   Eosinophils Relative 1 %   Eosinophils Absolute 0.2 0.0 - 0.5 K/uL   Basophils Relative 0 %   Basophils Absolute 0.0 0.0 - 0.1 K/uL   Immature Granulocytes 2 %   Abs Immature Granulocytes 0.26 (H) 0.00 - 0.07 K/uL    Comment: Performed at [facility] [address] [place], [place] [address]  Resp Panel by RT-PCR (Flu A&B, Covid) Nasopharyngeal Swab     Status: None   Collection Time: 03/06/21  2:23 PM   Specimen: Nasopharyngeal Swab; Nasopharyngeal(NP) swabs in vial  transport medium  Result Value Ref Range   SARS Coronavirus 2 by RT PCR NEGATIVE NEGATIVE    Comment: (NOTE) SARS-CoV-2 target nucleic acids are NOT DETECTED.  The SARS-CoV-2 RNA is generally detectable in upper respiratory specimens during the acute phase of infection. The lowest concentration of SARS-CoV-2 viral copies this assay can detect is 138 copies/mL. A negative result does not preclude SARS-Cov-2 infection and should not be used as the sole basis for treatment or other patient management decisions. A negative result may occur with  improper specimen collection/handling, submission of specimen other than nasopharyngeal swab, presence of viral mutation(s) within the areas targeted by this assay, and inadequate number of viral copies(<138 copies/mL). A negative result must be combined with clinical observations, patient history, and epidemiological information. The expected result is Negative.  Fact Sheet for Patients:  EntrepreneurPulse.com.au  Fact Sheet for Healthcare Providers:  IncredibleEmployment.be  This test is no t yet approved or cleared by the [place] FDA and  has been authorized for detection and/or diagnosis of SARS-CoV-2 by FDA under an Emergency Use Authorization (EUA). This EUA will remain  in effect (meaning this test can be used) for the duration of the COVID-19 declaration under Section 564(b)(1) of the Act, 21 U.S.C.section 360bbb-3(b)(1), unless the authorization is terminated  or revoked sooner.       Influenza A by PCR NEGATIVE NEGATIVE   Influenza B by PCR NEGATIVE NEGATIVE    Comment: (NOTE) The Xpert Xpress SARS-CoV-2/FLU/RSV plus assay is intended as an aid in the diagnosis of influenza from Nasopharyngeal swab specimens and should not be used as a sole basis for treatment. Nasal washings and aspirates are unacceptable for Xpert Xpress SARS-CoV-2/FLU/RSV testing.  Fact Sheet for  Patients: EntrepreneurPulse.com.au  Fact Sheet for Healthcare Providers: IncredibleEmployment.be  This test is not yet approved or cleared by the [place] FDA and has been authorized for detection and/or diagnosis of SARS-CoV-2 by FDA under an Emergency Use Authorization (EUA). This EUA will remain in effect (meaning this test can be used) for the duration of the COVID-19 declaration under Section 564(b)(1) of the Act, 21 U.S.C. section 360bbb-3(b)(1), unless the authorization is terminated or revoked.  Performed at [facility], [place] [address] [place], [place] [address]     ECG   A. fib with RVR, RBBB at 163- Personally Reviewed  Telemetry   A. fib with RVR with rates into the 180s- Personally Reviewed  Radiology  DG Chest Port 1 View  Result Date: 03/06/2021 CLINICAL DATA:  Atrial fibrillation EXAM: PORTABLE CHEST 1 VIEW COMPARISON:  06/13/2020 FINDINGS: Low lung volumes. Probable central bronchiectasis. Mild interstitial prominence. No significant pleural effusion. No pneumothorax. Cardiomediastinal contours are likely within normal limits for technique. IMPRESSION: Mild interstitial prominence, which may reflect chronic changes or edema. Electronically Signed   By: Macy Mis M.D.   On: 03/06/2021 12:53    Cardiac Studies   N/A  Impression   1. Active Problems: 2.   New onset atrial fibrillation ([place]) 3.   Recommendation   1. Ms. Harnish has new onset A. fib with RVR with rapid ventricular response in the setting of presumed sepsis with what appears to be bullae on her left toes, acute renal failure and lactic acidosis.  She has been started on amiodarone for rate control although continues to conduct rapidly.  She was hypotensive on admission, but now actually is hypertensive, with systolic blood pressure between 140 and 150.  Better rate control would be helpful and I recommend starting diltiazem in addition to  the amiodarone for rate control.  Cardioversion is not advised given the fact that her underlying illness likely is driving her A. fib and I suspect she will either not convert or revert at this point. Also, she is not yet anticoagulated - agree with stating heparin given CHADSVASC score of 5 and her acute kidney injury would preclude a DOAC. Perhaps that may be an option down the road.  Would recommend continue to treat underlying illness.  Thanks for the consultation.  Cardiology will follow with you.  Time Spent Directly with Patient:  I have spent a total of 65 minutes with the patient reviewing hospital notes, telemetry, EKGs, labs and examining the patient as well as establishing an assessment and plan that was discussed personally with the patient.  > 50% of time was spent in direct patient care.  Length of Stay:  LOS: 0 days   Pixie Casino, MD, [facility], [place] Director of the Advanced Lipid Disorders &  Cardiovascular Risk Reduction Clinic Diplomate of the American Board of Clinical Lipidology Attending Cardiologist  Direct Dial: [phone]  Fax: [phone]  Website:  [email or web address] Moncure 03/06/2021, 4:53 PM

## 2021-03-06 NOTE — ED Notes (Signed)
Report given to rn on 3e 

## 2021-03-06 NOTE — ED Notes (Signed)
RN only able to get one culture set at this time. Pt extremely difficult stick attempted x 3.

## 2021-03-06 NOTE — Progress Notes (Signed)
Pharmacy Antibiotic Note  Alisha Terrell is a 80 y.o. female admitted on 03/06/2021 with sepsis.  Pharmacy has been consulted for vancomycin dosing.  Presented hypotension and tachycardia - has boil on leg. WBC 9.7, LA 2.2, afebrile. Scr 3.41 (CrCl 12 mL/min).   Plan: Vancomycin 1500 mg IV once then will dose by level based on Scr trend Ceftriaxone 2g IV every 24 hours Monitor renal fx, cx results, clinical pic, and vanc random level  Height: 4\' 11"  (149.9 cm) Weight: 79.4 kg (175 lb) IBW/kg (Calculated) : 43.2  Temp (24hrs), Avg:98.3 F (36.8 C), Min:98.3 F (36.8 C), Max:98.3 F (36.8 C)  Recent Labs  Lab 03/06/21 1133  WBC 9.7    CrCl cannot be calculated (Patient's most recent lab result is older than the maximum 21 days allowed.).    Allergies  Allergen Reactions  . Codeine Rash and Other (See Comments)    Agitation, bad dreams  . Omnicef [Cefdinir] Nausea And Vomiting    Confirmed with patient at bedside that no rash - just had significant GI effects    Antimicrobials this admission: Vancomcyin 5/25 >>  Ceftriaxone 5/25 >>   Dose adjustments this admission: N/A  Microbiology results: 5/25 BCx: sent  Thank you for allowing pharmacy to be a part of this patient's care.  Antonietta Jewel, PharmD, [place] Clinical Pharmacist  Phone: [phone] 03/06/2021 1:07 PM  Please check AMION for all [place] phone numbers After 10:00 PM, call [place] [phone]

## 2021-03-06 NOTE — ED Notes (Signed)
Dr Elissa Hefty at  The bedside

## 2021-03-06 NOTE — Consult Note (Signed)
NAME:  Alisha Terrell, MRN:  [id number], DOB:  1941/01/05, LOS: 0 ADMISSION DATE:  03/06/2021, CONSULTATION DATE:  5/25 REFERRING MD:  Vanita Panda MD, Reason for Consult:  Sepsis  History of Present Illness:  Per chart review and patient exam,  Alisha Terrell is a 80 y.o. F who presented to the [facility] ED on 5/25 via EMS with new onset A.fib with RVR.  She states she has a past medical history of asthma, HTN, TIA, and osteoarthritis. She reports having been seen by Dr. Audelia Acton with [facility] cardiology in the past.   Alisha Terrell lives at home with her husband who she takes care of. "Starting last weekend" she had stopped eating and drinking. She endorses nausea, some vomiting, diarrhea, and decreased UOP over the last week. She also endorses chills, shortness of breath, increased dark yellow sputum production, and wheezing. She denies chest pain and palpitations. She also started do develop swelling and redness to one of her left toes with redness and pain.  EMS was called by family on 5/25 with concerns of dehydration. When EMS arrived she was found to have new onset Afib with RVR. ED workup at [facility] was notable for a new AKI (creatine 3.41, BUN 55), AGMA 18, lactate 2.2,  K 3.4, WBC 17.7. She received 2L LR and initiation of broad spectrum antibiotics.  PCCM was consulted for consideration for admission.    Pertinent  Medical History  Asthma, TIA,GERD, OSA (wears CPAP), HTN, Arthritis   Significant Hospital Events: Including procedures, antibiotic start and stop dates in addition to other pertinent events   . 5/25 Presented to [facility] with new onset AFIB with RVR. Code sepsis called  Interim History / Subjective:  See above.  Objective   Blood pressure (!) 112/59, pulse (!) 140, temperature 98.3 F (36.8 C), temperature source Oral, resp. rate (!) 24, height 4\' 11"  (1.499 m), weight 79.4 kg, SpO2 95 %.        Intake/Output Summary (Last 24 hours) at 03/06/2021 1518 Last data filed at  03/06/2021 1419 Gross per 24 hour  Intake 100 ml  Output --  Net 100 ml   Filed Weights   03/06/21 1149  Weight: 79.4 kg    Examination: General:  Conversational, no acute distress, obese HEENT: MM pink and dry, anicteric, trachea midline  Neuro: GCS 15, RASS 0, PERRL 18mm CV: Afib on bedside telemetry, irregular rate and rhythm, no m/r/g appreciated PULM:  clear in the upper lobe and  in the lower lobes, chest expansion symmetric, air movement throught GI: soft, bsx4 active, non tender Extremities: warm/dry, no pretibial edema, capillary refill less than 3 seconds  Skin: cellulitis to left toe with edema and erythema with blistering, no other rashes or lesions   Labs/imaging that I havepersonally reviewed  (right click and "Reselect all SmartList Selections" daily)  CMP CBC BMP Lactate CXR 12 lead  Resolved Hospital Problem list     Assessment & Plan:  Sepsis AGMA Lactate 2.2, WBC 17.7, glucose 135, cellulitis to Left toe. CXR does not suggest pneumonia. ?osteo. Tachycardic but normotensive upon exam. Suspect AGMA secondary to sepsis.  P: -S/P 2 Liters LR. Give another 1L of LR -Continue broad spectrum antibiotics- Vanc and ceftriaxone. Narrow as cultures result -Obtain BC and UA/UC  Afib with RVR- New onset ?secondary to hypovolemia VS electrolyte disturbances.   K 3.4 -Start amioadarone drip with load -Cardiology consulted -Fluid administration as discussed. -Goal K 4.0, MG 2.0 -Concern for contaminate CMP per  nursing. Obtain another CMP and MG level  AKI Creatinine 3.4, endorses decreased UOP, diarrhea, and inability to drink over past few days. Suspect prerenal. HGB 15, HCT 45, suspect patient is dry. Home meds include lasix, losartan, and celebrex which could have contributed further to renal insult. P: -S/P 2 Liters LR. Give another 1L of LR -Gentile continuous IVF administration.  -Hold home antihypertensives -Ensure renal perfusion. Goal MAP 65 or  greater. -Avoid neprotoxic drugs as possible. Renally dose medications -Strict I&O's -Follow up AM creatinine  Cellulitis of Left Toe P: -On Vanc and ceftriaxone -Continue to monitor -Consider orthopedics or podiatry consult if no improvement of blistering  To be admitted by the hospitalist service. Please call PCCM if needed.   Best practice (right click and "Reselect all SmartList Selections" daily)  Per Primary  Labs   CBC: Recent Labs  Lab 03/06/21 1133 03/06/21 1305  WBC 9.7 17.7*  NEUTROABS  --  16.5*  HGB 7.6* 15.0  HCT 23.8* 45.9  MCV 93.0 88.3  PLT 73* 735    Basic Metabolic Panel: Recent Labs  Lab 03/06/21 1305  NA 136  K 3.4*  CL 99  CO2 19*  GLUCOSE 135*  BUN 55*  CREATININE 3.41*  CALCIUM 8.2*   GFR: Estimated Creatinine Clearance: 12 mL/min (A) (by C-G formula based on SCr of 3.41 mg/dL (H)). Recent Labs  Lab 03/06/21 1133 03/06/21 1201 03/06/21 1305  WBC 9.7  --  17.7*  LATICACIDVEN  --  2.2*  --     Liver Function Tests: Recent Labs  Lab 03/06/21 1305  AST 26  ALT 23  ALKPHOS 49  BILITOT 1.2  PROT 6.3*  ALBUMIN 3.0*   No results for input(s): LIPASE, AMYLASE in the last 168 hours. No results for input(s): AMMONIA in the last 168 hours.  ABG    Component Value Date/Time   TCO2 23 01/17/2016 1148     Coagulation Profile: Recent Labs  Lab 03/06/21 1201  INR 1.2    Cardiac Enzymes: No results for input(s): CKTOTAL, CKMB, CKMBINDEX, TROPONINI in the last 168 hours.  HbA1C: Hgb A1c MFr Bld  Date/Time Value Ref Range Status  11/14/2015 10:04 AM 6.0 4.6 - 6.5 % Final    Comment:    Glycemic Control Guidelines for People with Diabetes:Non Diabetic:  <6%Goal of Therapy: <7%Additional Action Suggested:  >8%     CBG: No results for input(s): GLUCAP in the last 168 hours.  Review of Systems:   Positives in [place]  Gen:  fever, chills, weight change, fatigue, night sweats HEENT:  blurred vision, double vision, hearing  loss, tinnitus, sinus congestion, rhinorrhea, sore throat, neck stiffness, dysphagia PULM:shortness of breath, cough, sputum production, hemoptysis, wheezing CV:  chest pain, edema, orthopnea, paroxysmal nocturnal dyspnea, palpitations GI:  abdominal pain, nausea, vomiting, diarrhea, hematochezia, melena, constipation, change in bowel habits GU:  dysuria, hematuria, polyuria, oliguria, urethral discharge Endocrine:  hot or cold intolerance, polyuria, polyphagia or appetite change Derm: rash, dry skin, scaling or peeling skin change Heme:  easy bruising, bleeding, bleeding gums Neuro: headache, numbness, weakness, slurred speech, loss of memory or consciousness    Past Medical History:  She,  has a past medical history of Arthritis, Asthma, and Hypertension.   Surgical History:   Past Surgical History:  Procedure Laterality Date  . ABDOMINAL SURGERY    . CESAREAN SECTION    . CHOLECYSTECTOMY       Social History:   reports that she is a non-smoker but  has been exposed to tobacco smoke. She has never used smokeless tobacco. She reports current alcohol use of about 3.0 standard drinks of alcohol per week. She reports that she does not use drugs.   Family History:  Her family history includes Hypertension (age of onset: 27) in her mother; Pneumonia (age of onset: 105) in her father.   Allergies Allergies  Allergen Reactions  . Codeine Rash and Other (See Comments)    Agitation, bad dreams  . Omnicef [Cefdinir] Nausea And Vomiting    Confirmed with patient at bedside that no rash - just had significant GI effects     Home Medications  Prior to Admission medications   Medication Sig Start Date End Date Taking? Authorizing Provider  ADVAIR DISKUS 250-50 MCG/DOSE AEPB Inhale 1 puff into the lungs 2 (two) times daily.  08/22/15   [provider]  amLODipine (NORVASC) 5 MG tablet Take 5 mg by mouth daily. 07/21/15   [provider]  aspirin EC 81 MG tablet Take 81 mg  by mouth daily.    [provider]  atenolol (TENORMIN) 50 MG tablet Take 50 mg by mouth 2 (two) times daily. 09/10/15   [provider]  azelastine (ASTELIN) 0.1 % nasal spray Place into the nose. 07/31/16   [provider]  CALCIUM-VITAMIN D PO Take 1 tablet by mouth daily.    [provider]  celecoxib (CELEBREX) 200 MG capsule Take 200 mg by mouth every morning.  09/10/15   [provider]  Dextromethorphan-Guaifenesin (ROBITUSSIN COUGH/CHEST DM MAX PO) Take 1 mL by mouth 2 (two) times daily as needed (for cold symptoms).     [provider]  diclofenac Sodium (VOLTAREN) 1 % GEL APPLY TO HANDS TWICE A DAY FOR ARTHRITIS PAIN 08/16/19   [provider]  fexofenadine (ALLEGRA) 180 MG tablet Take 180 mg by mouth as needed.     [provider]  fluticasone (FLONASE) 50 MCG/ACT nasal spray Place 1 spray into both nostrils at bedtime.  09/26/15   [provider]  furosemide (LASIX) 20 MG tablet Take one tablet every morning for excess fluid 09/26/20   [provider]  ibandronate (BONIVA) 150 MG tablet Take 150 mg by mouth every 30 (thirty) days.  08/16/19   [provider]  losartan (COZAAR) 100 MG tablet Take 100 mg by mouth daily. 07/21/15   [provider]  montelukast (SINGULAIR) 10 MG tablet Take 10 mg by mouth at bedtime. 09/10/15   [provider]  Multiple Vitamins-Minerals (CENTRUM SILVER ADULT 50+ PO) Take 1 tablet by mouth daily.    [provider]  omeprazole (PRILOSEC) 40 MG capsule Take 40 mg by mouth in the morning and at bedtime.  09/10/15   [provider]  PROAIR HFA 108 (90 Base) MCG/ACT inhaler Inhale 1-2 puffs into the lungs every 6 (six) hours as needed for wheezing or shortness of breath.  10/05/15   [provider]  sodium chloride (OCEAN) 0.65 % SOLN nasal spray Place 1 spray into both nostrils as needed for congestion.    [provider]  traMADol (ULTRAM) 50 MG tablet Take 50 mg by mouth as needed. 03/23/19   [provider]     Critical care time: N/A   [employer] MSN, APRN, AGACNP-BC [facility]  03/06/2021 , 4:23 PM  Please see [email or web address] for pager details  If no response, please call [phone] After hours, please call Elink at  [phone]      

## 2021-03-06 NOTE — Progress Notes (Signed)
Pt admitted to 3e10 in Afib w/ RVR. Pt arrived on 2L O2 satting high 90's. Pt alert and oriented. Attached to tele monitor. Pt oriented to room and call bell. Falls precautions in place.

## 2021-03-06 NOTE — ED Notes (Signed)
I soke with dr Lorin Mercy about all the iv meds ordered  The pt has numerus complaints nausea some sob  .  C/o pain in both arms from arthritis  Wants some volteran ointment

## 2021-03-06 NOTE — ED Triage Notes (Signed)
Patient BIB [facility] for evaluation of afib with RVR, rate 160-200, denies history of atrial fibrillation. Initially 911 called by family for concern of dehydration. BP 92 palpated with EMS. Patient is alert and oriented. 20g saline lock in right wrist. Received 10mg  of diltiazem at approximately 1125. Patient denies chest pain and shortness of breath. SpO2 on room air 88%, 98% on 3L O2 [place].

## 2021-03-06 NOTE — ED Provider Notes (Signed)
[facility] EMERGENCY DEPARTMENT Provider Note   CSN: [id number] Arrival date & time: 03/06/21  1129     History Chief Complaint  Patient presents with  . Tachycardia    Alisha Terrell is a 80 y.o. female.  HPI Patient presents with concern of feeling poorly, malaise for several days.  Patient cannot identify specific change from baseline, nor time, but it seems as though over the past 2 or 3 days she has felt capable of performing previously normal ADL.  She now complains of weakness, fatigue, nausea, anorexia.  Patient has no history of A. fib, but per EMS the patient had A. fib in the ambulance, rate 160.  Per EMS the patient was short of breath initially as well, received 3 L via nasal cannula for appropriate saturation. Patient denies focal pain, does note an wound in her left second toe, erythematous.  She denies vomiting, diarrhea.    Past Medical History:  Diagnosis Date  . Arthritis   . Asthma   . Hypertension     Patient Active Problem List   Diagnosis Date Noted  . Age-related osteoporosis without current pathological fracture 08/11/2018  . Thyroid nodule 08/11/2018  . Localized, primary osteoarthritis of hand 06/17/2018  . Bilateral hand pain 06/01/2018  . Encounter for Medicare annual wellness exam 07/29/2017  . 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 Allergic rhinitis 11/14/2015  . Arthritis involving multiple sites 11/14/2015  . Essential hypertension 11/14/2015  . GERD (gastroesophageal reflux disease) 11/14/2015  . Low back pain 11/14/2015  . Neck pain 11/14/2015  . Obesity 11/14/2015  . OSA (obstructive sleep apnea) 11/14/2015  . Postmenopausal estrogen deficiency 11/14/2015  . Situational insomnia 11/14/2015  . 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 TIA (transient ischemic attack) 11/13/2015    Past Surgical History:  Procedure Laterality Date  . ABDOMINAL SURGERY    . CESAREAN SECTION    . CHOLECYSTECTOMY        OB History   No obstetric history on file.     Family History  Problem Relation Age of Onset  . Hypertension Mother 95  . Pneumonia Father 91    Social History   Tobacco Use  . Smoking status: Passive Smoke Exposure - Never Smoker  . Smokeless tobacco: Never Used  Substance Use Topics  . Alcohol use: Yes    Alcohol/week: 3.0 standard drinks    Types: 3 Glasses of wine per week  . Drug use: No    Home Medications Prior to Admission medications   Medication Sig Start Date End Date Taking? Authorizing Provider  ADVAIR DISKUS 250-50 MCG/DOSE AEPB Inhale 1 puff into the lungs 2 (two) times daily.  08/22/15   [provider]  amLODipine (NORVASC) 5 MG tablet Take 5 mg by mouth daily. 07/21/15   [provider]  aspirin EC 81 MG tablet Take 81 mg by mouth daily.    [provider]  atenolol (TENORMIN) 50 MG tablet Take 50 mg by mouth 2 (two) times daily. 09/10/15   [provider]  azelastine (ASTELIN) 0.1 % nasal spray Place into the nose. 07/31/16   [provider]  CALCIUM-VITAMIN D PO Take 1 tablet by mouth daily.    [provider]  celecoxib (CELEBREX) 200 MG capsule Take 200 mg by mouth every morning.  09/10/15   [provider]  Dextromethorphan-Guaifenesin (ROBITUSSIN COUGH/CHEST DM MAX PO) Take 1 mL by mouth 2 (two) times daily as needed (for cold symptoms).  [provider]  diclofenac Sodium (VOLTAREN) 1 % GEL APPLY TO HANDS TWICE A DAY FOR ARTHRITIS PAIN 08/16/19   [provider]  fexofenadine (ALLEGRA) 180 MG tablet Take 180 mg by mouth as needed.     [provider]  fluticasone (FLONASE) 50 MCG/ACT nasal spray Place 1 spray into both nostrils at bedtime.  09/26/15   [provider]  furosemide (LASIX) 20 MG tablet Take one tablet every morning for excess fluid 09/26/20   [provider]  ibandronate (BONIVA) 150 MG tablet Take 150 mg by mouth every 30  (thirty) days.  08/16/19   [provider]  losartan (COZAAR) 100 MG tablet Take 100 mg by mouth daily. 07/21/15   [provider]  montelukast (SINGULAIR) 10 MG tablet Take 10 mg by mouth at bedtime. 09/10/15   [provider]  Multiple Vitamins-Minerals (CENTRUM SILVER ADULT 50+ PO) Take 1 tablet by mouth daily.    [provider]  omeprazole (PRILOSEC) 40 MG capsule Take 40 mg by mouth in the morning and at bedtime.  09/10/15   [provider]  PROAIR HFA 108 (90 Base) MCG/ACT inhaler Inhale 1-2 puffs into the lungs every 6 (six) hours as needed for wheezing or shortness of breath.  10/05/15   [provider]  sodium chloride (OCEAN) 0.65 % SOLN nasal spray Place 1 spray into both nostrils as needed for congestion.    [provider]  traMADol (ULTRAM) 50 MG tablet Take 50 mg by mouth as needed. 03/23/19   [provider]    Allergies    Codeine and Omnicef [cefdinir]  Review of Systems   Review of Systems  Constitutional:       Per HPI, otherwise negative  HENT:       Per HPI, otherwise negative  Respiratory:       Per HPI, otherwise negative  Cardiovascular:       Per HPI, otherwise negative  Gastrointestinal: Negative for vomiting.  Endocrine:       Negative aside from HPI  Genitourinary:       Neg aside from HPI   Musculoskeletal:       Per HPI, otherwise negative  Skin: Positive for color change.  Neurological: Negative for syncope.    Physical Exam Updated Vital Signs BP 105/82   Pulse 95   Temp 98.3 F (36.8 C) (Oral)   Resp 19   Ht 4\' 11"  (1.499 m)   Wt 79.4 kg   SpO2 92%   BMI 35.35 kg/m   Physical Exam Vitals and nursing note reviewed.  Constitutional:      General: She is not in acute distress.    Appearance: She is well-developed.  HENT:     Head: Normocephalic and atraumatic.  Eyes:     Conjunctiva/sclera: Conjunctivae normal.  Cardiovascular:     Rate and Rhythm: Tachycardia  present. Rhythm irregular.  Pulmonary:     Effort: Pulmonary effort is normal. No respiratory distress.     Breath sounds: Normal breath sounds. No stridor.  Abdominal:     General: There is no distension.  Skin:    General: Skin is warm and dry.       Neurological:     Mental Status: She is alert and oriented to person, place, and time.     Cranial Nerves: No cranial nerve deficit.      ED Results / Procedures / Treatments   Labs (all labs ordered are listed, but only  abnormal results are displayed) Labs Reviewed  CBC - Abnormal; Notable for the following components:      Result Value   RBC 2.56 (*)    Hemoglobin 7.6 (*)    HCT 23.8 (*)    Platelets 73 (*)    All other components within normal limits  LACTIC ACID, PLASMA - Abnormal; Notable for the following components:   Lactic Acid, Venous 2.2 (*)    All other components within normal limits  PROTIME-INR - Abnormal; Notable for the following components:   Prothrombin Time 15.6 (*)    All other components within normal limits  APTT - Abnormal; Notable for the following components:   aPTT 21 (*)    All other components within normal limits  COMPREHENSIVE METABOLIC PANEL - Abnormal; Notable for the following components:   Potassium 3.4 (*)    CO2 19 (*)    Glucose, Bld 135 (*)    BUN 55 (*)    Creatinine, Ser 3.41 (*)    Calcium 8.2 (*)    Total Protein 6.3 (*)    Albumin 3.0 (*)    GFR, Estimated 13 (*)    Anion gap 18 (*)    All other components within normal limits  CBC WITH DIFFERENTIAL/PLATELET - Abnormal; Notable for the following components:   WBC 17.7 (*)    RBC 5.20 (*)    Neutro Abs 16.5 (*)    Lymphs Abs 0.6 (*)    Abs Immature Granulocytes 0.26 (*)    All other components within normal limits  RESP PANEL BY RT-PCR (FLU A&B, COVID) ARPGX2  CULTURE, BLOOD (ROUTINE X 2)  CULTURE, BLOOD (ROUTINE X 2)  LACTIC ACID, PLASMA  OCCULT BLOOD X 1 CARD TO LAB, STOOL    EKG EKG  Interpretation  Date/Time:  Wednesday Mar 06 2021 11:37:12 EDT Ventricular Rate:  163 PR Interval:    QRS Duration: 130 QT Interval:  313 QTC Calculation: 516 R Axis:   41 Text Interpretation: Right bundle branch block Atrial fibrillation Abnormal ECG Confirmed by Carmin Muskrat [phone]) on 03/06/2021 1:09:34 PM   Radiology DG Chest Port 1 View  Result Date: 03/06/2021 CLINICAL DATA:  Atrial fibrillation EXAM: PORTABLE CHEST 1 VIEW COMPARISON:  06/13/2020 FINDINGS: Low lung volumes. Probable central bronchiectasis. Mild interstitial prominence. No significant pleural effusion. No pneumothorax. Cardiomediastinal contours are likely within normal limits for technique. IMPRESSION: Mild interstitial prominence, which may reflect chronic changes or edema. Electronically Signed   By: Macy Mis M.D.   On: 03/06/2021 12:53    Procedures Procedures   Medications Ordered in ED Medications  lactated ringers bolus 1,000 mL (1,000 mLs Intravenous New Bag/Given 03/06/21 1230)    And  lactated ringers bolus 1,000 mL (1,000 mLs Intravenous New Bag/Given 03/06/21 1214)    And  lactated ringers bolus 500 mL (has no administration in time range)  vancomycin (VANCOREADY) IVPB 1500 mg/300 mL (1,500 mg Intravenous New Bag/Given 03/06/21 1303)  cefTRIAXone (ROCEPHIN) 2 g in sodium chloride 0.9 % 100 mL IVPB (2 g Intravenous New Bag/Given 03/06/21 1235)    ED Course  I have reviewed the triage vital signs and the nursing notes.  Pertinent labs & imaging results that were available during my care of the patient were reviewed by me and considered in my medical decision making (see chart for details). On initial evaluation patient found to be tachycardic, hypotensive, and with concern for cellulitis patient was designated as a code sepsis.  Fluid resuscitation initiated.  Update:, Patient  notes that she is feeling somewhat better.  However, she remains hypotensive after initial fluid resuscitation.  Heart  rate has reduced.  Update:, Now, following 2.5 L fluid resuscitation initiation of antibiotics, the patient has persistent hypotension, A. fib, rapid ventricular response.  I discussed her case with her cardiology colleagues, critical care colleagues for consultation and admission respectively.     3:50 PM I discussed patient's case at bedside with our critical care colleagues. Given her improved blood pressure following 2.5 L fluid resuscitation, patient is appropriate for stepdown care.  In regards to the patient's ongoing A. fib, amiodarone and started, and patient will continue fluid resuscitation. I discussed her case with our cardiology colleagues, they will follow as a consulting service as well.   Elderly female no history of A. fib presents with weakness, is septic on arrival with tachycardia, tachypnea, arrhythmia, hypotension.  Patient may have source of left foot cellulitis, though urinary tract infection remains a consideration.  Patient received empiric antibiotics, fluid resuscitation per sepsis protocol, had substantial improvement clinically, but had persistent A. fib.  Given her resolution of hypotension use it as needed is appropriate for stepdown admission by her critical care colleagues. MDM Rules/Calculators/A&P MDM Number of Diagnoses or Management Options AKI (acute kidney injury) ([place]): new, needed workup Severe sepsis ([place]): new, needed workup   Amount and/or Complexity of Data Reviewed Clinical lab tests: ordered and reviewed Tests in the radiology section of CPT: ordered and reviewed Tests in the medicine section of CPT: reviewed and ordered Decide to obtain previous medical records or to obtain history from someone other than the patient: yes Obtain history from someone other than the patient: yes Review and summarize past medical records: yes Discuss the patient with other providers: yes Independent visualization of images, tracings, or specimens: yes  Risk  of Complications, Morbidity, and/or Mortality Presenting problems: high Diagnostic procedures: high Management options: high  Critical Care Total time providing critical care: 30-74 minutes (45)  Patient Progress Patient progress: improved  Final Clinical Impression(s) / ED Diagnoses Final diagnoses:  Severe sepsis ([place])  AKI (acute kidney injury) ([place])     Carmin Muskrat, MD 03/06/21 1557

## 2021-03-06 NOTE — ED Notes (Signed)
DMITTING AT  THE BEDSIDE

## 2021-03-06 NOTE — ED Notes (Signed)
RN Notified that possible CMP was contaminated. RN requested from MD Vanita Panda to redraw CBC, CMP to verify results.

## 2021-03-06 NOTE — Sepsis Progress Note (Addendum)
Notified bedside nurse of need to draw repeat lactic acid. Difficult stick per bedside RN. Reattempting lactic acid draw.

## 2021-03-06 NOTE — ED Notes (Signed)
Dr. Lorin Mercy paged to RN per her request

## 2021-03-06 NOTE — H&P (Signed)
History and Physical    Alisha Terrell TML:[id number] DOB: 1941-05-11 DOA: 03/06/2021  PCP: Alisha Sacramento, MD Consultants:  Alisha Terrell - cardiology; Alisha Terrell - podiatry; Alisha Terrell - pulmonology; Alisha Terrell - pain management Patient coming from:  Home - lives with husband; NOK: Daughter, Alisha Terrell, [phone]  Chief Complaint: Weakness  HPI: Alisha Terrell is a 80 y.o. female with medical history significant of HTN; obesity; chronic back pain; and OSA on CPAP presenting with afib.  She reports nothing to eat and virtually nothing to drink in 3 days.  She felt terrible.  She was too weak to even get out of bed.  One episode of diarrhea today, also one episode yesterday and some on Monday.  She took 2 Imodium this AM.  She has had headache, nausea.  Emesis 2-3 times.  She is coughing up a bit of thick mucus, new for her.  Tmax 99, no true fevers.  She is not able to tell that she is in afib.  She has a history of "feeling like her heart was beating" with skipping beats and pounding in her chest - none recently.  She has never been told she had afib.  No sick contacts.  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    ED Course:   Here with sepsis, new afib.  Improved BP with IVF.  Cardiology and PCCM consulting.  Starting Amiodarone.  ?source for sepsis.  Review of Systems: As per HPI; otherwise review of systems reviewed and negative.   Ambulatory Status:  Ambulates with a cane  COVID Vaccine Status:   Complete plus booster  Past Medical History:  Diagnosis Date  . Arthritis   . Asthma   . Chronic back pain   . Class 2 obesity due to excess calories with body mass index (BMI) of 35.0 to 35.9 in adult   . Hypertension   . OSA (obstructive sleep apnea)     Past Surgical History:  Procedure Laterality Date  . ABDOMINAL SURGERY    . CESAREAN SECTION    .  CHOLECYSTECTOMY      Social History   Socioeconomic History  . Marital status: Married    Spouse name: Alisha Terrell  . Number of children: 2  . Years of education: Not on file  . Highest education level: Not on file  Occupational History  . Occupation: retired  Tobacco Use  . Smoking status: Passive Smoke Exposure - Never Smoker  . Smokeless tobacco: Never Used  Substance and Sexual Activity  . Alcohol use: Yes    Alcohol/week: 3.0 standard drinks    Types: 3 Glasses of wine per week  . Drug use: No  . Sexual activity: Not on file  Other Topics Concern  . Not on file  Social History Narrative   Patient is right-handed. She lives with her husband in a one level home. She does not exercise.   Social Determinants of Health   Financial Resource Strain: Not on file  Food Insecurity: Not on file  Transportation Needs: Not on file  Physical Activity: Not on file  Stress: Not on file  Social Connections: Not on file  Intimate Partner Violence: Not on file    Allergies  Allergen Reactions  . Codeine Rash and Other (See Comments)    Agitation, bad dreams  .  Omnicef [Cefdinir] Nausea And Vomiting    Confirmed with patient at bedside that no rash - just had significant GI effects    Family History  Problem Relation Age of Onset  . Hypertension Mother 12  . Pneumonia Father 100    Prior to Admission medications   Medication Sig Start Date End Date Taking? Authorizing Provider  ADVAIR DISKUS 250-50 MCG/DOSE AEPB Inhale 1 puff into the lungs 2 (two) times daily.  08/22/15   [provider]  amLODipine (NORVASC) 5 MG tablet Take 5 mg by mouth daily. 07/21/15   [provider]  aspirin EC 81 MG tablet Take 81 mg by mouth daily.    [provider]  atenolol (TENORMIN) 50 MG tablet Take 50 mg by mouth 2 (two) times daily. 09/10/15   [provider]  azelastine (ASTELIN) 0.1 % nasal spray Place into the nose. 07/31/16   [provider]   CALCIUM-VITAMIN D PO Take 1 tablet by mouth daily.    [provider]  celecoxib (CELEBREX) 200 MG capsule Take 200 mg by mouth every morning.  09/10/15   [provider]  Dextromethorphan-Guaifenesin (ROBITUSSIN COUGH/CHEST DM MAX PO) Take 1 mL by mouth 2 (two) times daily as needed (for cold symptoms).     [provider]  diclofenac Sodium (VOLTAREN) 1 % GEL APPLY TO HANDS TWICE A DAY FOR ARTHRITIS PAIN 08/16/19   [provider]  fexofenadine (ALLEGRA) 180 MG tablet Take 180 mg by mouth as needed.     [provider]  fluticasone (FLONASE) 50 MCG/ACT nasal spray Place 1 spray into both nostrils at bedtime.  09/26/15   [provider]  furosemide (LASIX) 20 MG tablet Take one tablet every morning for excess fluid 09/26/20   [provider]  ibandronate (BONIVA) 150 MG tablet Take 150 mg by mouth every 30 (thirty) days.  08/16/19   [provider]  losartan (COZAAR) 100 MG tablet Take 100 mg by mouth daily. 07/21/15   [provider]  montelukast (SINGULAIR) 10 MG tablet Take 10 mg by mouth at bedtime. 09/10/15   [provider]  Multiple Vitamins-Minerals (CENTRUM SILVER ADULT 50+ PO) Take 1 tablet by mouth daily.    [provider]  omeprazole (PRILOSEC) 40 MG capsule Take 40 mg by mouth in the morning and at bedtime.  09/10/15   [provider]  PROAIR HFA 108 (90 Base) MCG/ACT inhaler Inhale 1-2 puffs into the lungs every 6 (six) hours as needed for wheezing or shortness of breath.  10/05/15   [provider]  sodium chloride (OCEAN) 0.65 % SOLN nasal spray Place 1 spray into both nostrils as needed for congestion.    [provider]  traMADol (ULTRAM) 50 MG tablet Take 50 mg by mouth as needed. 03/23/19   [provider]    Physical Exam: Vitals:   03/06/21 1545 03/06/21 1556 03/06/21 1645 03/06/21 1700  BP:   (!) 151/72   Pulse: 82  64 (!) 108  Resp: (!)  25  (!) 31 (!) 22  Temp:  98.4 F (36.9 C)    TempSrc:      SpO2: 97%  93% 91%  Weight:      Height:         . General:  Appears ill with periodic productive cough and apparent nausea . Eyes:  PERRL, EOMI, normal lids, iris . ENT:  grossly normal hearing, lips & tongue, mmm; appropriate dentition . Neck:  no  LAD, masses or thyromegaly . Cardiovascular:  Severe tachycardia up to 170s. No LE edema.  Marland Kitchen Respiratory:   CTA bilaterally with no wheezes/rales/rhonchi.  Mildly to moderately increased respiratory effort. . Abdomen:  soft, NT, ND . Skin:  Bullous lesion on left 2nd toe with surrounding erythema extending along the dorsum of the foot     . Musculoskeletal:  grossly normal tone BUE/BLE, good ROM, no bony abnormality . Lower extremity:  No LE edema.  Limited foot exam with no ulcerations other than as noted above and with ?bunion along the medial collapsed arch on the left.  2+ distal pulses. Marland Kitchen Psychiatric:  blunted mood and affect, speech fluent and appropriate, AOx3 . Neurologic:  CN 2-12 grossly intact, moves all extremities in coordinated fashion    Radiological Exams on Admission: Independently reviewed - see discussion in A/P where applicable  DG Chest Port 1 View  Result Date: 03/06/2021 CLINICAL DATA:  Atrial fibrillation EXAM: PORTABLE CHEST 1 VIEW COMPARISON:  06/13/2020 FINDINGS: Low lung volumes. Probable central bronchiectasis. Mild interstitial prominence. No significant pleural effusion. No pneumothorax. Cardiomediastinal contours are likely within normal limits for technique. IMPRESSION: Mild interstitial prominence, which may reflect chronic changes or edema. Electronically Signed   By: Macy Mis M.D.   On: 03/06/2021 12:53    EKG: Independently reviewed.  Afib with rate 163; RBBB  Labs on Admission: I have personally reviewed the available labs and imaging studies at the time of the admission.  Pertinent labs:   K+ 3.4 Glucose 135 BUN  55/Creatinine 3.41/GFR 13; 16/0.85/65 on 08/22/20 Anion gap 18 WBC 17.7 Lactate 2.2 INR 1.2 COVID/flu negative  Assessment/Plan Principal Problem:   New onset atrial fibrillation (HCC) Active Problems:   Essential hypertension   Obesity   OSA (obstructive sleep apnea)   AKI (acute kidney injury) ([place])   Severe sepsis (HCC)   Toe ulcer, left, limited to breakdown of skin ([place])   Afib with RVR -Patient presenting with new-onset afib.  -Etiology is thought to be related to sepsis, although it is also possible that the afib and toe ulcer with foot infection are true-true-and unrelated -Since the afib onset is unknown, will focus on rate control and searching for the underlying cause at this time. -She previously had an event monitor in place in Dec and her HR ranged from 44 to 110 without evidence of afib - possibly a precursor to development of this issue? -Will admit to SDU for Amiodarone drip as per protocol; patient needs admission based on unstable vital signs and/or afib associated with a high-risk situation (possible sepsis). -Rate is as high as 170 sustained and Amio bolus has just been initiated -She is more hemodynamically stable at this time, but if instability recurs then DCCV may need to be considered -Will consult cardiology  -CHA2DS2-VASc Score is >2 and so patient would benefit from oral anticoagulation. There is evidence of net benefit even in the extremely elderly population. -Will start Heparin for now  Severe sepsis   -SIRS criteria in this patient includes: Leukocytosis, tachycardia  -Patient has evidence of acute organ failure with elevated lactate >2; recurrent hypotension (SBP < 90 or MAP < 65 x 2 readings); creatinine >2 that may not easily explained by another condition (afib could be the driver of these issues or the sepsis could be causing the afib). -While awaiting blood cultures, this appears to be a preseptic condition. -Sepsis protocol  initiated -Suspected source is toe infection (see below) -Blood and urine cultures pending -  Treat with IV Cefepime/Vanc for undifferentiated sepsis/toe infection -Will trend lactate to ensure improvement -Will order procalcitonin level.   -This patient is at risk for shock and may require vasopressors to keep MAP >65 and/or due to lactate >2 despite volume resuscitation; shock is associated with >40% mortality.  Toe ulcer -Patient with foot deformity (Charcot foot, likely) and probable friction blister with infection -Podiatry was consulted; Dr. Jacqualyn Posey saw the patient, unroofed the blister; obtained a culture; and ordered xray -Ongoing antibiotic treatment, as above -If sepsis is present, it appears likely related to this issue -I have ordered ABIs in case revascularization may be indicated, although she appears to have good circulation by pulses at this time -LE wound order set utilized including labs (CRP, ESR, A1c, prealbumin, HIV, and blood cultures) and consults (peripheral vascular navigator; TOC team; wound care; and nutrition)  AKI -Likely related to volume depletion in the setting of sepsis and decreased renal perfusion due to severe RVR -Hold Cozaar  HTN -Hold Norvasc and Atenolol in the setting of hypotension while in the ER  OSA -Continue CPAP  Asthma -Continue Advair ([place] formulary substitution) ,Singulair, Albuterol  Obesity -Body mass index is 35.35 kg/m..  -Weight loss should be encouraged -Outpatient PCP/bariatric medicine/bariatric surgery f/u encouraged    Note: This patient has been tested and is negative for the novel coronavirus COVID-19. She has been fully vaccinated against COVID-19.    DVT prophylaxis:  Heparin Code Status:  Full - confirmed with patient/family Family Communication: Daughter present throughout evaluation  Disposition Plan:  The patient is from: home  Anticipated d/c is to: home without [facility] services   Anticipated d/c date will  depend on clinical response to treatment, likely several days  Patient is currently: acutely ill Consults called: PCCM; Cardiology; Podiatry; peripheral vascular navigator; nutrition; TOC team Admission status: Admit - It is my clinical opinion that admission to INPATIENT is reasonable and necessary because of the expectation that this patient will require hospital care that crosses at least 2 midnights to treat this condition based on the medical complexity of the problems presented.  Given the aforementioned information, the predictability of an adverse outcome is felt to be significant.   Karmen Bongo MD [facility]   How to contact the [facility] Attending or Consulting provider [place] or covering provider during after hours [place], for this patient?  1. Check the care team in [facility] and look for a) attending/consulting [facility] provider listed and b) the [facility] team listed 2. Log into [email or web address] and use [place]'s universal password to access. If you do not have the password, please contact the hospital operator. 3. Locate the [facility] provider you are looking for under [facility] and page to a number that you can be directly reached. 4. If you still have difficulty reaching the provider, please page the [facility] (Director on Call) for the Hospitalists listed on amion for assistance.   03/06/2021, 6:06 PM

## 2021-03-06 NOTE — Sepsis Progress Note (Signed)
[facility] is following this code sepsis ?

## 2021-03-06 NOTE — ED Notes (Signed)
Unsuccessful attempt to call report  Many rings over 20 then somone answered and said she would check if report could be given  She never came back to the phone

## 2021-03-06 NOTE — ED Notes (Signed)
The pt is c/o burning in her rt iv site  Pot run slowed to 62ml /hr  Lactated ringers applied to run at 100 ml to dilute the pot drip

## 2021-03-06 NOTE — ED Notes (Signed)
Alisha Terrell was given report

## 2021-03-07 ENCOUNTER — Inpatient Hospital Stay (HOSPITAL_COMMUNITY): Payer: Medicare HMO

## 2021-03-07 DIAGNOSIS — I4891 Unspecified atrial fibrillation: Secondary | ICD-10-CM | POA: Diagnosis not present

## 2021-03-07 DIAGNOSIS — A419 Sepsis, unspecified organism: Secondary | ICD-10-CM | POA: Diagnosis not present

## 2021-03-07 DIAGNOSIS — L97521 Non-pressure chronic ulcer of other part of left foot limited to breakdown of skin: Secondary | ICD-10-CM

## 2021-03-07 DIAGNOSIS — N179 Acute kidney failure, unspecified: Secondary | ICD-10-CM | POA: Diagnosis not present

## 2021-03-07 LAB — BASIC METABOLIC PANEL
Anion gap: 13 (ref 5–15)
BUN: 53 mg/dL — ABNORMAL HIGH (ref 8–23)
CO2: 19 mmol/L — ABNORMAL LOW (ref 22–32)
Calcium: 7.4 mg/dL — ABNORMAL LOW (ref 8.9–10.3)
Chloride: 103 mmol/L (ref 98–111)
Creatinine, Ser: 2.32 mg/dL — ABNORMAL HIGH (ref 0.44–1.00)
GFR, Estimated: 21 mL/min — ABNORMAL LOW (ref >60)
Glucose, Bld: 120 mg/dL — ABNORMAL HIGH (ref 70–99)
Potassium: 3.4 mmol/L — ABNORMAL LOW (ref 3.5–5.1)
Sodium: 135 mmol/L (ref 135–145)

## 2021-03-07 LAB — CBC
HCT: 35 % — ABNORMAL LOW (ref 36.0–46.0)
Hemoglobin: 11.8 g/dL — ABNORMAL LOW (ref 12.0–15.0)
MCH: 29.2 pg (ref 26.0–34.0)
MCHC: 33.7 g/dL (ref 30.0–36.0)
MCV: 86.6 fL (ref 80.0–100.0)
Platelets: 165 10*3/uL (ref 150–400)
RBC: 4.04 MIL/uL (ref 3.87–5.11)
RDW: 14.9 % (ref 11.5–15.5)
WBC: 16.2 10*3/uL — ABNORMAL HIGH (ref 4.0–10.5)
nRBC: 0 % (ref 0.0–0.2)

## 2021-03-07 LAB — SEDIMENTATION RATE: Sed Rate: 22 mm/hr (ref 0–22)

## 2021-03-07 LAB — LACTIC ACID, PLASMA: Lactic Acid, Venous: 2.2 mmol/L (ref 0.5–1.9)

## 2021-03-07 LAB — HEPARIN LEVEL (UNFRACTIONATED)
Heparin Unfractionated: <0.1 IU/mL — ABNORMAL LOW (ref 0.30–0.70)
Heparin Unfractionated: <0.1 IU/mL — ABNORMAL LOW (ref 0.30–0.70)
Heparin Unfractionated: <0.1 IU/mL — ABNORMAL LOW (ref 0.30–0.70)

## 2021-03-07 LAB — HEMOGLOBIN A1C
Hgb A1c MFr Bld: 6.1 % — ABNORMAL HIGH (ref 4.8–5.6)
Mean Plasma Glucose: 128 mg/dL

## 2021-03-07 LAB — MAGNESIUM: Magnesium: 2.1 mg/dL (ref 1.7–2.4)

## 2021-03-07 MED ORDER — POTASSIUM CHLORIDE CRYS ER 20 MEQ PO TBCR
40.0000 meq | EXTENDED_RELEASE_TABLET | Freq: Four times a day (QID) | ORAL | Status: AC
  Administered 2021-03-07 (×2): 40 meq via ORAL
  Filled 2021-03-07 (×2): qty 2

## 2021-03-07 MED ORDER — ADULT MULTIVITAMIN W/MINERALS CH
1.0000 | ORAL_TABLET | Freq: Every day | ORAL | Status: DC
  Administered 2021-03-07 – 2021-03-12 (×5): 1 via ORAL
  Filled 2021-03-07 (×7): qty 1

## 2021-03-07 MED ORDER — HEPARIN BOLUS VIA INFUSION
2500.0000 [IU] | Freq: Once | INTRAVENOUS | Status: AC
  Administered 2021-03-07: 2500 [IU] via INTRAVENOUS
  Filled 2021-03-07: qty 2500

## 2021-03-07 MED ORDER — METOPROLOL TARTRATE 12.5 MG HALF TABLET
12.5000 mg | ORAL_TABLET | Freq: Two times a day (BID) | ORAL | Status: DC
  Administered 2021-03-07 – 2021-03-08 (×3): 12.5 mg via ORAL
  Filled 2021-03-07 (×3): qty 1

## 2021-03-07 MED ORDER — SODIUM CHLORIDE 0.9 % IV BOLUS
500.0000 mL | Freq: Once | INTRAVENOUS | Status: AC
  Administered 2021-03-07: 500 mL via INTRAVENOUS

## 2021-03-07 MED ORDER — DICLOFENAC SODIUM 1 % EX GEL
2.0000 g | Freq: Four times a day (QID) | CUTANEOUS | Status: DC
  Administered 2021-03-07 – 2021-03-15 (×30): 2 g via TOPICAL
  Filled 2021-03-07: qty 100

## 2021-03-07 MED ORDER — ENSURE MAX PROTEIN PO LIQD
11.0000 [oz_av] | Freq: Every day | ORAL | Status: DC
  Administered 2021-03-07 – 2021-03-11 (×5): 11 [oz_av] via ORAL
  Filled 2021-03-07 (×7): qty 330

## 2021-03-07 MED ORDER — GLUCERNA SHAKE PO LIQD
237.0000 mL | Freq: Two times a day (BID) | ORAL | Status: DC
  Administered 2021-03-07 – 2021-03-13 (×9): 237 mL via ORAL

## 2021-03-07 NOTE — Progress Notes (Signed)
Progress Note  Patient Name: Alisha Terrell Date of Encounter: 03/07/2021  [facility] Cardiologist: Elouise Munroe, MD   Subjective   Patient is eating breakfast. She states this is her first meal for the past few days. She is feeling slight improved, less dizzy. She denied any heart palpitation and chest pain. She is mildly SOB with movement. She states her foot was drained by the podiatry yesterday and dressing was changed today. She noted herself stating to urinate some today.   Inpatient Medications    Scheduled Meds: . mometasone-formoterol  2 puff Inhalation BID  . montelukast  10 mg Oral QHS  . pantoprazole  40 mg Oral BID  . sodium chloride flush  3 mL Intravenous Q12H  . vancomycin variable dose per unstable renal function (pharmacist dosing)   Does not apply See admin instructions   Continuous Infusions: . amiodarone 30 mg/hr (03/06/21 2112)  . cefTRIAXone (ROCEPHIN)  IV Stopped (03/06/21 1419)  . diltiazem (CARDIZEM) infusion Stopped (03/07/21 0105)  . heparin 1,000 Units/hr (03/07/21 0403)  . lactated ringers    . lactated ringers 100 mL/hr at 03/06/21 2024   PRN Meds: acetaminophen **OR** acetaminophen, albuterol, ondansetron **OR** ondansetron (ZOFRAN) IV, traMADol   Vital Signs    Vitals:   03/07/21 0106 03/07/21 0116 03/07/21 0300 03/07/21 0404  BP: (!) 84/49 (!) 91/46 (!) 91/53 94/61  Pulse: 70 77 78 82  Resp: (!) 23 (!) 25 (!) 23 20  Temp:    99 F (37.2 C)  TempSrc:    Oral  SpO2:   100% 99%  Weight:      Height:        Intake/Output Summary (Last 24 hours) at 03/07/2021 0701 Last data filed at 03/07/2021 0105 Gross per 24 hour  Intake 3607.29 ml  Output --  Net 3607.29 ml   Last 3 Weights 03/06/2021 03/06/2021 01/15/2021  Weight (lbs) 180 lb 8.9 oz 175 lb 183 lb 9.6 oz  Weight (kg) 81.9 kg 79.379 kg 83.28 kg      Telemetry    A fib with RVR 100-120s- Personally Reviewed  ECG    Repeat EKG today pending - Personally  Reviewed  Physical Exam   GEN: No acute distress. Fatigued   Neck: Short and thick neck.  Cardiac: Irregularly irregular, no murmurs, rubs, or gallops.  Respiratory: Clear but diminished at base to auscultation bilaterally. On 2LNC. Speaks full sentence.  GI: Soft, nontender, non-distended  MS: No edema; No deformity. Neuro:  Alert and oriented x3, follow commands appropriately, no focal deficit  Psych: Normal affect  Left foot with dressing in place, not interrupted for exam   Labs    High Sensitivity Troponin:  No results for input(s): TROPONINIHS in the last 720 hours.    Chemistry Recent Labs  Lab 03/06/21 1305 03/07/21 0250  NA 136 135  K 3.4* 3.4*  CL 99 103  CO2 19* 19*  GLUCOSE 135* 120*  BUN 55* 53*  CREATININE 3.41* 2.32*  CALCIUM 8.2* 7.4*  PROT 6.3*  --   ALBUMIN 3.0*  --   AST 26  --   ALT 23  --   ALKPHOS 49  --   BILITOT 1.2  --   GFRNONAA 13* 21*  ANIONGAP 18* 13     Hematology Recent Labs  Lab 03/06/21 1133 03/06/21 1305 03/07/21 0250  WBC 9.7 17.7* 16.2*  RBC 2.56* 5.20* 4.04  HGB 7.6* 15.0 11.8*  HCT 23.8* 45.9 35.0*  MCV  93.0 88.3 86.6  MCH 29.7 28.8 29.2  MCHC 31.9 32.7 33.7  RDW 14.7 14.7 14.9  PLT 73* 185 165    BNPNo results for input(s): BNP, PROBNP in the last 168 hours.   DDimer No results for input(s): DDIMER in the last 168 hours.   Radiology    DG Chest Port 1 View  Result Date: 03/06/2021 CLINICAL DATA:  Atrial fibrillation EXAM: PORTABLE CHEST 1 VIEW COMPARISON:  06/13/2020 FINDINGS: Low lung volumes. Probable central bronchiectasis. Mild interstitial prominence. No significant pleural effusion. No pneumothorax. Cardiomediastinal contours are likely within normal limits for technique. IMPRESSION: Mild interstitial prominence, which may reflect chronic changes or edema. Electronically Signed   By: Macy Mis M.D.   On: 03/06/2021 12:53   DG Foot Complete Left  Result Date: 03/06/2021 CLINICAL DATA:  Left foot  ulcer and pain. EXAM: LEFT FOOT - COMPLETE 3+ VIEW COMPARISON:  August 29, 2019. FINDINGS: There is no evidence of fracture or dislocation. There is no evidence of arthropathy or other focal bone abnormality. Soft tissues are unremarkable. IMPRESSION: Negative. Electronically Signed   By: Marijo Conception M.D.   On: 03/06/2021 18:53    Cardiac Studies   24 hour event monitor from 08/07/20:  Minimum HR (bpm): 45, burden of bradycardia appears on average to be 20-30% of the time.  Maximum HR (bpm): 111 Supraventricular Ectopy: <1% SVT: none Ventricular Ectopy: <1% NSVT: none Ventricular Tachycardia: none Pauses: none AV block: first degree AVB appears consistently.  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 No Afib, no pauses. Infrequent ectopy. No diary events.    Echo from 08/17/20:  1. Left ventricular ejection fraction, by estimation, is 60 to 65%. The  left ventricle has normal function. The left ventricle has no regional  wall motion abnormalities. There is mild concentric left ventricular  hypertrophy. Left ventricular diastolic  parameters are consistent with Grade I diastolic dysfunction (impaired  relaxation). The average left ventricular global longitudinal strain is  -23.9 %. The global longitudinal strain is normal.  2. Right ventricular systolic function is normal. The right ventricular  size is normal.  3. The mitral valve is normal in structure. Trivial mitral valve  regurgitation.  4. The aortic valve is tricuspid. Aortic valve regurgitation is not  visualized.  5. There is mild dilatation of the ascending aorta, measuring 38 mm.  6. The inferior vena cava is normal in size with <50% respiratory  variability, suggesting right atrial pressure of 8 mmHg.   Comparison(s): Compared to prior TTE in 2017, there is no significant  change.    Patient Profile     80 yo  female with history of HTN, asthma, TIA, OSA on CPAP,obesity, chronic back pain, who presented to the ER with c/o weakness, nausea, vomiting, heart palpitation, and nothing to eat and drink for 3 days, found to have sepsis with lactic acidosis >2 2/2 presumed toe blister with infection, AKI, and A. fib with RVR. Cardiology is consulted and  following for afib.  Assessment & Plan    New onset A fib with RVR - follows with Dr Margaretann Loveless outpatient for heart palpitation, 08/07/20 30 days event monitor showed sinus rhythm with first degree AVB with frequent sinus bradycardia. No Afib, no pauses. Infrequent ectopy. No diary events.   -presented with sepsis 2/2 toe infection, with new onset A fib RVR - likely acute illness mediated A fib RVR - started on amiodarone  gtt by PCCM at admission due to hypotension, diltiazem gtt was added later due to inadequate rate control, BP low and Cardizem gtt stopped at 1AM,  rate control is improved at 110s (reasonable in acute sepsis), will repeat EKG today, continue amiodarone gtt  - CHA2DS2VASc score is 6 due to age, gender, HTN, TIA; recommend anticoagulation, started on heparin gtt, may transition to Eliquis when AKI recovers  - replete K >4 and Mag >2, K 3.4, added K 80 MEQ supplement today, Mag level pending   Sepsis 2/2 left 2nd toe abscess with cellulitis  - presented with weakness, N/V, poor PO intake, hypotension, tachycardia, left 2nd toe bulla with surrounding erythema; leukocytosis, lactic acidosis >2,  and AKI, POA  - see by podiatry, s/p bedside I&D with purulent drainage  - Blood Cx no sent, Wound culture no growth so far  - currently on IV CTX +Vanco , managed per primary team   AKI/ATN - due to sepsis  - IVF continued, renal index improving, producing urine now - monitor UOP, trend BMP daily   HTN Hypotension  - home med include amlodipine, atenolol, lasix, losartan  held for hypotension currently   OSA - CPAP at night   Asthma - No acute  exacerbation, wean off oxygen as tolerated, managed per primary team       For questions or updates, please contact [facility] Please consult [email or web address] for contact info under        Signed, Margie Billet, NP  03/07/2021, 7:01 AM

## 2021-03-07 NOTE — Progress Notes (Addendum)
Initial Nutrition Assessment  DOCUMENTATION CODES:   Obesity unspecified  INTERVENTION:   -Glucerna Shake po BID, each supplement provides 220 kcal and 10 grams of protein -Ensure Max po daily, each supplement provides 150 kcal and 30 grams of protein -MVI with minerals daily  NUTRITION DIAGNOSIS:   Increased nutrient needs related to wound healing as evidenced by estimated needs.  GOAL:   Patient will meet greater than or equal to 90% of their needs  MONITOR:   PO intake,Supplement acceptance,Labs,Weight trends,Skin,I & O's  REASON FOR ASSESSMENT:   Consult Assessment of nutrition requirement/status  ASSESSMENT:   ELLIONA DODDRIDGE is a 80 y.o. female with medical history significant of HTN; obesity; chronic back pain; and OSA on CPAP presenting with afib  Pt admitted with a-fib with RVR and severe sepsis.   Reviewed I/O's: +3.6 L x 24 hours  Spoke with pt and son at bedside. Per pt, she has experienced a general decline in health over the past 4 days secondary to nausea and dizziness. Per pt, she was unable to keep food and liquids down during this time due to severe nausea. Since being admitted, her intake has improved- consumed half of a banana and oatmeal for breakfast.   Prior to acute illness, pt reports fair appetite. She is very regimented in her food intake (Breakfast: special K, fruit, and coffee; Lunch: canned soup; Dinner: meat, starch, and vegetable).   Reviewed wt hx; wt has been stable over the past 4 months. Per pt, she suspects she lost 15# within the past 4 days due to not eating. Pt reports her UBW is around 175-180# and admits to slow moderate weight loss from portion control and increased consumption of fresh fruits and vegetables.   Per podiatry notes, pt with Superficial abscess left foot (cellulitis); plan for MRI. 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 Pt then noticed that her toe started to swell- she did not seek medical attention for this. She also shares her mobility varies by the day.   Discussed importance of good meal and supplement intake to promote healing. Pt amenable to supplements. Also provided pty with apple juice and comb per her request.   Medications reviewed and include cardizem.  Labs reviewed: K: 3.4.   NUTRITION - FOCUSED PHYSICAL EXAM:  Flowsheet Row Most Recent Value  Orbital Region No depletion  Upper Arm Region Mild depletion  Thoracic and Lumbar Region No depletion  Buccal Region No depletion  Temple Region No depletion  Clavicle Bone Region No depletion  Clavicle and Acromion Bone Region No depletion  Scapular Bone Region No depletion  Dorsal Hand No depletion  Patellar Region Mild depletion  Anterior Thigh Region Mild depletion  Posterior Calf Region Mild depletion  Edema (RD Assessment) Mild  Hair Reviewed  Eyes Reviewed  Mouth Reviewed  Skin Reviewed  Nails Reviewed       Diet Order:   Diet Order            Diet Heart Room service appropriate? Yes; Fluid consistency: Thin  Diet effective now                 EDUCATION NEEDS:   Education needs have been addressed  Skin:  Skin Assessment: Skin Integrity Issues: Skin Integrity Issues:: Other (Comment) Other: Superficial abscess left foot, cellulitis  Last BM:  03/06/21  Height:   Ht Readings from Last 1 Encounters:  03/06/21 4\' 11"  (1.499 m)    Weight:   Wt Readings from Last 1 Encounters:  03/06/21 81.9 kg    Ideal Body Weight:  44.7 kg  BMI:  Body mass index is 36.47 kg/m.  Estimated Nutritional Needs:   Kcal:  1550-1750  Protein:  90-105 grams  Fluid:  > 1.5 L    Loistine Chance, RD, LDN, [place] Registered Dietitian II Certified Diabetes Care and Education Specialist Please refer to [facility] for RD and/or RD on-call/weekend/after hours pager

## 2021-03-07 NOTE — Progress Notes (Signed)
Paged by RN for HR 130-180s. Telemetry appears to still be Afib. Amiodarone gtt running. Will add metoprolol 12.5 mg OTO for now and monitor. BP 133/100 while I was in the room, but has been hypotensive. If no improvement in rate, may add another 12.5 mg as pressure tolerates.  Pt is asymptomatic.

## 2021-03-07 NOTE — Plan of Care (Signed)
  Problem: Clinical Measurements: Goal: Ability to maintain clinical measurements within normal limits will improve Outcome: Progressing Goal: Will remain free from infection Outcome: Progressing Goal: Diagnostic test results will improve Outcome: Progressing Goal: Respiratory complications will improve Outcome: Progressing Goal: Cardiovascular complication will be avoided Outcome: Progressing   Problem: Activity: Goal: Risk for activity intolerance will decrease Outcome: Progressing   Problem: Nutrition: Goal: Adequate nutrition will be maintained Outcome: Progressing   Problem: Safety: Goal: Ability to remain free from injury will improve Outcome: Progressing

## 2021-03-07 NOTE — Hospital Course (Signed)
80 year old community dwelling white female Known history of migraines followed by Dr. Tomi Likens of neurology-TIA [phone] 10/12/2015 Obstructive sleep apnea followed right coronary stone pulmonology [place] (AutoPap 5-10) Asthma COVID recovered 07/02/2020 Dry weight about 184 pounds Follows locally with cardiology-event monitor shows 20 to 30% bradycardia burden-no A. fib was noted  Presented to [facility] with several days of malaise weakness fatigue anorexia in the setting of having swelling of the toe about a week prior to coming to the ED ED with heart rates in the 200s blood pressure 92 O2 sat 88% up to 98% on 3 L  Lactic acid 2.2, BUN/creatinine up from baseline 255/3.4 Anion gap 18 WBC 17  Patient given 2.5 L crystalloid-amiodarone started-empiric vancomycin ceftriaxone started Found to have left second toe with bullae Critical care evaluated felt severe sepsis without shock Podiatrist consulted-unroofed the blister obtain cultures and got x-rays  Potassium 3.4 BUNs/creatinine 55/3.4-->53/2.3 Calcium 7.4 Prealbumin 7.0 CO2 19 White count 17.7-->16.2 Hemoglobin 15.0-->11.8 Platelet 185-->165 Foot x-rays   [-]

## 2021-03-07 NOTE — Progress Notes (Signed)
ANTICOAGULATION + ANTIBIOTIC CONSULT NOTE - Follow Up Consult  Pharmacy Consult for Heparin; Vancomycin Indication: atrial fibrillation; cellultis  Allergies  Allergen Reactions  . Codeine Rash and Other (See Comments)    Agitation, bad dreams  . Omnicef [Cefdinir] Nausea And Vomiting    Confirmed with patient at bedside that no rash - just had significant GI effects    Patient Measurements: Height: 4\' 11"  (149.9 cm) Weight: 81.9 kg (180 lb 8.9 oz) IBW/kg (Calculated) : 43.2 Heparin Dosing Weight: 62 kg  Vital Signs: Temp: 97.8 F (36.6 C) (05/26 1330) Temp Source: Oral (05/26 1330) BP: 92/57 (05/26 1330) Pulse Rate: 100 (05/26 1330)  Labs: Recent Labs    03/06/21 1133 03/06/21 1201 03/06/21 1305 03/07/21 0250 03/07/21 1229  HGB 7.6*  --  15.0 11.8*  --   HCT 23.8*  --  45.9 35.0*  --   PLT 73*  --  185 165  --   APTT  --  21*  --   --   --   LABPROT  --  15.6*  --   --   --   INR  --  1.2  --   --   --   HEPARINUNFRC  --   --   --  <0.10* <0.10*  CREATININE  --   --  3.41* 2.32*  --     Estimated Creatinine Clearance: 17.9 mL/min (A) (by C-G formula based on SCr of 2.32 mg/dL (H)).  Assessment: 85 YOF who presented with Afib, was started on IV heparin. Not currently on anticoagulation.    Heparin level remains undetectable (< 0.10) after 2000 unit bolus and rate increase from 750 > 1000 units/hr ~4am.  No known infusion issues.   Vancomycin 1500 mg IV loading dose given 5/25 at 1pm. AKI, creatinine trended down.  Estimate next Vanc dose will be needed tomorrow.  Also on Ceftriaxone 2 gm IV q24h.    S/p drainage of large blister on left toe per Podiatry. Abundant Staph aureus in culture. Also covering for sepsis  5/25Blood x 2: no growth < 24 hrs to date 5/25 urine: sent 5/25 left toe abscess: abundant Staph aureus, re-incubated 5/25 COVID and flu: neg  Goal of Therapy:  Heparin level 0.3-0.7 units/ml Monitor platelets by anticoagulation protocol: Yes    Plan:  Heparin 2500 units bolus Increase heparin drip to 1200 units/hr Heparin level ~8 hrs after increase.  Random Vancomycin level in am to help guide further dosing. Will re-dose when level is </= 15-20 mcg/ml. Follow renal function, culture data, clinical progress and antibiotic plans.  Arty Baumgartner, RPh 03/07/2021,1:36 PM

## 2021-03-07 NOTE — Progress Notes (Signed)
[place] for heparin Indication: atrial fibrillation  Allergies  Allergen Reactions  . Codeine Rash and Other (See Comments)    Agitation, bad dreams  . Omnicef [Cefdinir] Nausea And Vomiting    Confirmed with patient at bedside that no rash - just had significant GI effects    Patient Measurements: Height: 4\' 11"  (149.9 cm) Weight: 81.9 kg (180 lb 8.9 oz) IBW/kg (Calculated) : 43.2 Heparin Dosing Weight: 61 kg   Vital Signs: Temp: 99.3 F (37.4 C) (05/26 0008) Temp Source: Oral (05/26 0008) BP: 91/53 (05/26 0300) Pulse Rate: 78 (05/26 0300)  Labs: Recent Labs    03/06/21 1133 03/06/21 1201 03/06/21 1305 03/07/21 0250  HGB 7.6*  --  15.0 11.8*  HCT 23.8*  --  45.9 35.0*  PLT 73*  --  185 165  APTT  --  21*  --   --   LABPROT  --  15.6*  --   --   INR  --  1.2  --   --   HEPARINUNFRC  --   --   --  <0.10*  CREATININE  --   --  3.41*  --     Estimated Creatinine Clearance: 12.2 mL/min (A) (by C-G formula based on SCr of 3.41 mg/dL (H)).   Assessment: 38 YOF who presented with Afib to start IV heparin. Not currently on anticoagulation.   Heparin level undetectable on gtt at 750 units/hr. No issues with line or bleeding reported per RN.  Goal of Therapy:  Heparin level 0.3-0.7 units/ml Monitor platelets by anticoagulation protocol: Yes   Plan:  Rebolus heparin 2000 units Increase heparin infusion to 1000 units/hr  F/u 8 hr HL  Sherlon Handing, PharmD, BCPS Please see amion for complete clinical pharmacist phone list 03/07/2021 4:01 AM

## 2021-03-07 NOTE — Progress Notes (Signed)
PROGRESS NOTE   Alisha Terrell  ESP:[id number] DOB: [birth year] DOA: 03/06/2021 PCP: Christain [place], MD  Brief Narrative:  80 year old community dwelling white female Known history of migraines followed by Dr. Tomi Likens of neurology-TIA [phone] 10/12/2015 Obstructive sleep apnea followed right coronary stone pulmonology [place] (AutoPap 5-10) Asthma COVID recovered 07/02/2020 Dry weight about 184 pounds Follows locally with cardiology-event monitor shows 20 to 30% bradycardia burden-no A. fib was noted  Presented to [facility] with several days of malaise weakness fatigue anorexia in the setting of having swelling of the toe about a week prior to coming to the ED ED with heart rates in the 200s blood pressure 92 O2 sat 88% up to 98% on 3 L  Lactic acid 2.2, BUN/creatinine up from baseline 255/3.4 Anion gap 18 WBC 17  Patient given 2.5 L crystalloid-amiodarone started-empiric vancomycin ceftriaxone started Found to have left second toe with bullae Critical care evaluated felt severe sepsis without shock Podiatrist consulted-unroofed the blister obtain cultures and got x-rays  Hospital-Problem based course  Severe sepsis secondary to left foot cellulitis/bullae Empiric ceftriaxone vancomycin Podiatry following hopeful for recovery with supportive management-defer to them further steps including?  Surgery Continue LR 50/H Pain control tramadol 50 every 6 as needed New onset A. fib RVR CHADS2 score >4/heparin currently Appreciate input per cardiology Adjust meds as per them-note Cardizem DC 2/2 hypotension-HR = 110-112 Continues amiodarone gtt. Magnesium 2.1 [Home meds atenolol 50 twice daily]?  Addition of this HTN Holding currently losartan 100, Lasix 20, amlodipine Reimplement when pressures are improved OSA/CPAP AutoPap settings 5-10 Ordered at bedtime mandatory CPAP Complicated migraines/prior TIA followed by Dr. Susa Raring neurology Not currently on any meds Continue aspirin  81 GDMT Obstructive asthma followed by [facility] pulmonology Continue Singulair 10, ProAir/albuterol nebs, Flonase Hold at this time dextromethorphan  DVT prophylaxis: Heparin Code Status: Full Family Communication:  Disposition:  Status is: Inpatient  Remains inpatient appropriate because:Hemodynamically unstable, Unsafe d/c plan and IV treatments appropriate due to intensity of illness or inability to take PO   Dispo: The patient is from: Home              Anticipated d/c is to: Unclear at this time              Patient currently is not medically stable to d/c.   Difficult to place patient No       Consultants:   Cardiology  Critical care  Procedures: Multiple  Antimicrobials: As above   Subjective: Awake alert looks very comfortable sitting in the bed Tells me she feels fair has had something to eat did not like the oatmeal No chest pain Cannot remember what the podiatrist told her this morning-I reassured her that at this time I do not think there is any surgery planned Tells me that she had thick sputum yesterday but that is cleared up and worries if there is something going on in her lungs I reassured her that she just has a wheeze Cardiology has seen the patient and adjusted medications this morning Also asking for cream for her hands as she has arthritis and has had apparently cervical spine shots in the past  Objective: Vitals:   03/07/21 0116 03/07/21 0300 03/07/21 0404 03/07/21 0755  BP: (!) 91/46 (!) 91/53 94/61   Pulse: 77 78 82   Resp: (!) 25 (!) 23 20   Temp:   99 F (37.2 C)   TempSrc:   Oral   SpO2:  100% 99%  95%  Weight:      Height:        Intake/Output Summary (Last 24 hours) at 03/07/2021 0905 Last data filed at 03/07/2021 0854 Gross per 24 hour  Intake 3610.29 ml  Output --  Net 3610.29 ml   Filed Weights   03/06/21 1149 03/06/21 2057  Weight: 79.4 kg 81.9 kg    Examination: Awake pleasant coherent thick neck Mallampati  4 Cannot appreciate JVD at 30 degrees Chest clear but decreased air entry bilaterally posterolaterally she does have some wheeze Abdomen obese nontender no rebound I did not examine her toe as it had been debrided and worked on this morning by podiatry   Data Reviewed: personally reviewed   Potassium 3.4 BUNs/creatinine 55/3.4-->53/2.3 Calcium 7.4 Prealbumin 7.0 CO2 19 White count 17.7-->16.2 Hemoglobin 15.0-->11.8 Platelet 185-->165 Foot x-rays   [-]  Radiology Studies: DG Chest Port 1 View  Result Date: 03/06/2021 CLINICAL DATA:  Atrial fibrillation EXAM: PORTABLE CHEST 1 VIEW COMPARISON:  06/13/2020 FINDINGS: Low lung volumes. Probable central bronchiectasis. Mild interstitial prominence. No significant pleural effusion. No pneumothorax. Cardiomediastinal contours are likely within normal limits for technique. IMPRESSION: Mild interstitial prominence, which may reflect chronic changes or edema. Electronically Signed   By: Macy Mis M.D.   On: 03/06/2021 12:53   DG Foot Complete Left  Result Date: 03/06/2021 CLINICAL DATA:  Left foot ulcer and pain. EXAM: LEFT FOOT - COMPLETE 3+ VIEW COMPARISON:  August 29, 2019. FINDINGS: There is no evidence of fracture or dislocation. There is no evidence of arthropathy or other focal bone abnormality. Soft tissues are unremarkable. IMPRESSION: Negative. Electronically Signed   By: Marijo Conception M.D.   On: 03/06/2021 18:53     Scheduled Meds: . diclofenac Sodium  2 g Topical QID  . mometasone-formoterol  2 puff Inhalation BID  . montelukast  10 mg Oral QHS  . pantoprazole  40 mg Oral BID  . potassium chloride  40 mEq Oral Q6H  . sodium chloride flush  3 mL Intravenous Q12H  . vancomycin variable dose per unstable renal function (pharmacist dosing)   Does not apply See admin instructions   Continuous Infusions: . amiodarone 30 mg/hr (03/07/21 0906)  . cefTRIAXone (ROCEPHIN)  IV Stopped (03/06/21 1419)  . diltiazem (CARDIZEM)  infusion Stopped (03/07/21 0105)  . heparin 1,000 Units/hr (03/07/21 0403)  . lactated ringers    . lactated ringers 100 mL/hr at 03/06/21 2024     LOS: 1 day   Time spent: 84 minutes  Nita Sells, MD [facility] To contact the attending provider between 7A-7P or the covering provider during after hours 7P-7A, please log into the web site [email or web address] and access using universal [place] password for that web site. If you do not have the password, please call the hospital operator.  03/07/2021, 9:05 AM

## 2021-03-07 NOTE — Progress Notes (Signed)
ANTICOAGULATION CONSULT- Follow Up Consult  Pharmacy Consult for Heparin Indication: atrial fibrillation  Allergies  Allergen Reactions  . Codeine Rash and Other (See Comments)    Agitation, bad dreams  . Omnicef [Cefdinir] Nausea And Vomiting    Confirmed with patient at bedside that no rash - just had significant GI effects    Patient Measurements: Height: 4\' 11"  (149.9 cm) Weight: 81.9 kg (180 lb 8.9 oz) IBW/kg (Calculated) : 43.2 Heparin Dosing Weight: 62 kg  Vital Signs: Temp: 98 F (36.7 C) (05/26 1946) Temp Source: Oral (05/26 1946) BP: 84/60 (05/26 1946) Pulse Rate: 103 (05/26 1946)  Labs: Recent Labs    03/06/21 1133 03/06/21 1201 03/06/21 1305 03/07/21 0250 03/07/21 1229 03/07/21 2205  HGB 7.6*  --  15.0 11.8*  --   --   HCT 23.8*  --  45.9 35.0*  --   --   PLT 73*  --  185 165  --   --   APTT  --  21*  --   --   --   --   LABPROT  --  15.6*  --   --   --   --   INR  --  1.2  --   --   --   --   HEPARINUNFRC  --   --   --  <0.10* <0.10* <0.10*  CREATININE  --   --  3.41* 2.32*  --   --     Estimated Creatinine Clearance: 17.9 mL/min (A) (by C-G formula based on SCr of 2.32 mg/dL (H)).  Assessment: 29 YOF who presented with Afib, was started on IV heparin.   Heparin level remains undetectable (< 0.10) on 1200 units/hr. No issues with line or bleeding reported per RN.  Goal of Therapy:  Heparin level 0.3-0.7 units/ml Monitor platelets by anticoagulation protocol: Yes   Plan:  Heparin 2500 units bolus Increase heparin drip to 1450 units/hr Heparin level ~8 hrs after increase.  Sherlon Handing, PharmD, BCPS Please see amion for complete clinical pharmacist phone list 03/07/2021,11:15 PM

## 2021-03-07 NOTE — Progress Notes (Signed)
Subjective: 80 year old female admitted to the hospital with concerns of sepsis, infection of her left foot found to be in A. fib.  She had a large blister, superficial abscess which was drained yesterday.  She is having some occasional discomfort but the toe otherwise she is feeling a little bit better today this morning.  Objective: AAO x3, NAD DP/PT pulses palpable bilaterally, CRT less than 3 seconds Superficial area skin breakdown on the dorsal aspect left second toe with still erythema to the first MPJ with ascending cellulitis at the foot.  Still mild warmth and swelling to the foot.  There is no fluctuation or crepitation today.  No active purulence identified. No pain with calf compression, swelling, warmth, erythema        Assessment: Superficial abscess left foot, cellulitis; sepsis  Plan: X-rays were negative.  White blood cell count 16.2 this morning.  T-max of 100.1 last night.  At this point recommended continue with IV antibiotics.  This point no acute surgical intervention is needed.  However if no improvement in the cellulitis recommend MRI.  Hopefully toe will continue improved with local wound care and IV antibiotics.  Podiatry will continue to follow.  Celesta Gentile, DPM

## 2021-03-08 ENCOUNTER — Inpatient Hospital Stay (HOSPITAL_COMMUNITY): Payer: Medicare HMO

## 2021-03-08 DIAGNOSIS — A419 Sepsis, unspecified organism: Secondary | ICD-10-CM | POA: Diagnosis not present

## 2021-03-08 DIAGNOSIS — N179 Acute kidney failure, unspecified: Secondary | ICD-10-CM | POA: Diagnosis not present

## 2021-03-08 DIAGNOSIS — I4891 Unspecified atrial fibrillation: Secondary | ICD-10-CM

## 2021-03-08 DIAGNOSIS — R6520 Severe sepsis without septic shock: Secondary | ICD-10-CM | POA: Diagnosis not present

## 2021-03-08 DIAGNOSIS — L0390 Cellulitis, unspecified: Secondary | ICD-10-CM | POA: Diagnosis not present

## 2021-03-08 DIAGNOSIS — L97521 Non-pressure chronic ulcer of other part of left foot limited to breakdown of skin: Secondary | ICD-10-CM

## 2021-03-08 LAB — COMPREHENSIVE METABOLIC PANEL
ALT: 19 U/L (ref 0–44)
AST: 19 U/L (ref 15–41)
Albumin: 2 g/dL — ABNORMAL LOW (ref 3.5–5.0)
Alkaline Phosphatase: 38 U/L (ref 38–126)
Anion gap: 8 (ref 5–15)
BUN: 54 mg/dL — ABNORMAL HIGH (ref 8–23)
CO2: 19 mmol/L — ABNORMAL LOW (ref 22–32)
Calcium: 7.5 mg/dL — ABNORMAL LOW (ref 8.9–10.3)
Chloride: 106 mmol/L (ref 98–111)
Creatinine, Ser: 1.58 mg/dL — ABNORMAL HIGH (ref 0.44–1.00)
GFR, Estimated: 33 mL/min — ABNORMAL LOW (ref >60)
Glucose, Bld: 121 mg/dL — ABNORMAL HIGH (ref 70–99)
Potassium: 3.4 mmol/L — ABNORMAL LOW (ref 3.5–5.1)
Sodium: 133 mmol/L — ABNORMAL LOW (ref 135–145)
Total Bilirubin: 0.5 mg/dL (ref 0.3–1.2)
Total Protein: 4.3 g/dL — ABNORMAL LOW (ref 6.5–8.1)

## 2021-03-08 LAB — LACTIC ACID, PLASMA: Lactic Acid, Venous: 1.6 mmol/L (ref 0.5–1.9)

## 2021-03-08 LAB — CBC WITH DIFFERENTIAL/PLATELET
Abs Immature Granulocytes: 0.06 10*3/uL (ref 0.00–0.07)
Basophils Absolute: 0 10*3/uL (ref 0.0–0.1)
Basophils Relative: 0 %
Eosinophils Absolute: 0.4 10*3/uL (ref 0.0–0.5)
Eosinophils Relative: 4 %
HCT: 30.4 % — ABNORMAL LOW (ref 36.0–46.0)
Hemoglobin: 10.5 g/dL — ABNORMAL LOW (ref 12.0–15.0)
Immature Granulocytes: 1 %
Lymphocytes Relative: 5 %
Lymphs Abs: 0.6 10*3/uL — ABNORMAL LOW (ref 0.7–4.0)
MCH: 29.2 pg (ref 26.0–34.0)
MCHC: 34.5 g/dL (ref 30.0–36.0)
MCV: 84.7 fL (ref 80.0–100.0)
Monocytes Absolute: 0.6 10*3/uL (ref 0.1–1.0)
Monocytes Relative: 5 %
Neutro Abs: 9.5 10*3/uL — ABNORMAL HIGH (ref 1.7–7.7)
Neutrophils Relative %: 85 %
Platelets: 130 10*3/uL — ABNORMAL LOW (ref 150–400)
RBC: 3.59 MIL/uL — ABNORMAL LOW (ref 3.87–5.11)
RDW: 15 % (ref 11.5–15.5)
WBC: 11.1 10*3/uL — ABNORMAL HIGH (ref 4.0–10.5)
nRBC: 0 % (ref 0.0–0.2)

## 2021-03-08 LAB — ECHOCARDIOGRAM COMPLETE
AR max vel: 2.16 cm2
AV Area VTI: 1.97 cm2
AV Area mean vel: 1.73 cm2
AV Mean grad: 6 mmHg
AV Peak grad: 9.1 mmHg
Ao pk vel: 1.51 m/s
Calc EF: 49.9 %
Height: 59 in
Single Plane A2C EF: 52.4 %
Single Plane A4C EF: 46.9 %
Weight: 3075.86 oz

## 2021-03-08 LAB — BRAIN NATRIURETIC PEPTIDE: B Natriuretic Peptide: 309 pg/mL — ABNORMAL HIGH (ref 0.0–100.0)

## 2021-03-08 LAB — MAGNESIUM: Magnesium: 2.1 mg/dL (ref 1.7–2.4)

## 2021-03-08 LAB — HEPARIN LEVEL (UNFRACTIONATED)
Heparin Unfractionated: 0.19 IU/mL — ABNORMAL LOW (ref 0.30–0.70)
Heparin Unfractionated: 0.28 IU/mL — ABNORMAL LOW (ref 0.30–0.70)

## 2021-03-08 LAB — VANCOMYCIN, RANDOM: Vancomycin Rm: 9

## 2021-03-08 LAB — T4, FREE: Free T4: 1.68 ng/dL — ABNORMAL HIGH (ref 0.61–1.12)

## 2021-03-08 LAB — TSH: TSH: 3.166 u[IU]/mL (ref 0.350–4.500)

## 2021-03-08 MED ORDER — VANCOMYCIN HCL 1250 MG/250ML IV SOLN
1250.0000 mg | Freq: Once | INTRAVENOUS | Status: AC
  Administered 2021-03-08: 1250 mg via INTRAVENOUS
  Filled 2021-03-08: qty 250

## 2021-03-08 MED ORDER — HEPARIN BOLUS VIA INFUSION
2500.0000 [IU] | Freq: Once | INTRAVENOUS | Status: AC
  Administered 2021-03-08: 2500 [IU] via INTRAVENOUS
  Filled 2021-03-08: qty 2500

## 2021-03-08 MED ORDER — VANCOMYCIN HCL 1500 MG/300ML IV SOLN
1500.0000 mg | Freq: Once | INTRAVENOUS | Status: DC
  Filled 2021-03-08: qty 300

## 2021-03-08 MED ORDER — POTASSIUM CHLORIDE CRYS ER 20 MEQ PO TBCR
40.0000 meq | EXTENDED_RELEASE_TABLET | Freq: Four times a day (QID) | ORAL | Status: AC
  Administered 2021-03-08 (×2): 40 meq via ORAL
  Filled 2021-03-08 (×2): qty 2

## 2021-03-08 NOTE — Progress Notes (Signed)
PROGRESS NOTE   Alisha Terrell  [facility]:[id number] DOB: 1941-05-28 DOA: 03/06/2021 PCP: Christain [place], MD  Brief Narrative:  80 year old community dwelling white female Known history of migraines followed by Dr. Tomi Likens of neurology-TIA [phone] 10/12/2015 Obstructive sleep apnea followed right coronary [facility] pulmon[facility] [place] (AutoPap 5-10) Asthma COVID recovered 07/02/2020 Dry weight about 184 pounds Follows locally with cardiology-event monitor shows 20 to 30% bradycardia burden-no A. fib was noted  Presented to [facility] with several days of malaise weakness fatigue anorexia in the setting of having swelling of the toe about a week prior to coming to the ED ED with heart rates in the 200s blood pressure 92 O2 sat 88% up to 98% on 3 L  Lactic acid 2.2, BUN/creatinine up from baseline 255/3.4 Anion gap 18 WBC 17  Patient given 2.5 L crystalloid-amiodarone started-empiric vancomycin ceftriaxone started Found to have left second toe with bullae Critical care evaluated felt severe sepsis without shock Podiatrist consulted-unroofed the blister obtain cultures and got x-rays  Hospital-Problem based course  Severe sepsis secondary to left foot cellulitis/bullae Empiric ceftriaxone vancomycin at this time de-escalate based on final cultures Growing Staphylococcus and wound culture from admission Defer to podiatry operative management Pain control tramadol 50 every 6 as needed New onset A. fib RVR CHADS2 score >4/heparin currently note Cardizem DC 2/2 hypotension-HR = 110-112 Current meds: Metoprolol 12.5, amiodarone at 30 mg/H HTN Holding currently losartan 100, Lasix 20, amlodipine Reimplement when pressures are improved OSA/CPAP AutoPap settings 5-10 Ordered at bedtime mandatory CPAP Patient developing progressive cough with some concern for underlying volume overload/pneumonia etc.-CXR repeat 5/27 no active disease Symptomatic management Complicated migraines/prior TIA  followed by Dr. Tomi Likens neurology Not currently on any meds Continue aspirin 81 GDMT Obstructive asthma followed by [facility] Continue Singulair 10, ProAir/albuterol nebs, Flonase Hold at this time dextromethorphan  DVT prophylaxis: Heparin Code Status: Full Family Communication: I discussed with the patient's daughter on telephone in detail course up-to-date [phone] from the patient's phone Disposition:  Status is: Inpatient  Remains inpatient appropriate because:Hemodynamically unstable, Unsafe d/c plan and IV treatments appropriate due to intensity of illness or inability to take PO   Dispo: The patient is from: Home              Anticipated d/c is to: Unclear at this time              Patient currently is not medically stable to d/c.   Difficult to place patient No       Consultants:   Cardiology  Critical care  Procedures: Multiple  Antimicrobials: As above   Subjective:  A little winded this morning We got an x-ray which does not show much in terms of volume status I cut back her fluids anyway to 30 cc an hour Her hypertension limits aggressive rate control-was bolused yesterday 500 cc and we had to discontinue the Cardizem drip although metoprolol has been given by cardiology She has no fever or chills and is not producing sputum  Objective: Vitals:   03/08/21 0326 03/08/21 0759 03/08/21 0843 03/08/21 1121  BP: 110/65  100/90 94/77  Pulse: (!) 123 (!) 128 99 94  Resp: 20 (!) 22 18 20   Temp: 97.9 F (36.6 C)  (!) 97.4 F (36.3 C) (!) 97.3 F (36.3 C)  TempSrc: Oral  Oral Oral  SpO2: 95%  93% 95%  Weight: 87.2 kg     Height:        Intake/Output Summary (  Last 24 hours) at 03/08/2021 1226 Last data filed at 03/08/2021 0324 Gross per 24 hour  Intake 2572.97 ml  Output 350 ml  Net 2222.97 ml   Filed Weights   03/06/21 1149 03/06/21 2057 03/08/21 0326  Weight: 79.4 kg 81.9 kg 87.2 kg    Examination:  Slightly winded EOMI  NCAT Pulse ox is showing saturations varying anywhere from 83-93-even with putting the pulse ox on her air it is variable She has no chest pain There is no fever S1-S2 tachycardic rate irregular and uncontrolled with A. fib Abdomen soft no rebound Mild lower extremity edema Foot not examined today   Data Reviewed: personally reviewed   Potassium 3.4 BUNs/creatinine 55/3.4-->53/2.3-->54/1.5 CO2 19 White count 17.7-->16.2-->11.1 Hemoglobin 15.0-->11.8-->10.5 Platelet 185-->165-->130 Foot x-rays   [-]  Radiology Studies: DG CHEST PORT 1 VIEW  Result Date: 03/08/2021 CLINICAL DATA:  Pneumonia EXAM: PORTABLE CHEST 1 VIEW COMPARISON:  Two days ago and 06/13/2020 FINDINGS: Low volume chest with streaky density at the bases. Borderline heart size with stable aortic tortuosity. Artifact from EKG leads. No effusion or pneumothorax. Calcification over the right upper quadrant. IMPRESSION: Stable chest when compared to 2021.  No acute finding. Electronically Signed   By: Monte Fantasia M.D.   On: 03/08/2021 09:51   DG Foot Complete Left  Result Date: 03/06/2021 CLINICAL DATA:  Left foot ulcer and pain. EXAM: LEFT FOOT - COMPLETE 3+ VIEW COMPARISON:  August 29, 2019. FINDINGS: There is no evidence of fracture or dislocation. There is no evidence of arthropathy or other focal bone abnormality. Soft tissues are unremarkable. IMPRESSION: Negative. Electronically Signed   By: Marijo Conception M.D.   On: 03/06/2021 18:53     Scheduled Meds: . diclofenac Sodium  2 g Topical QID  . feeding supplement (GLUCERNA SHAKE)  237 mL Oral BID BM  . metoprolol tartrate  12.5 mg Oral BID  . mometasone-formoterol  2 puff Inhalation BID  . montelukast  10 mg Oral QHS  . multivitamin with minerals  1 tablet Oral Daily  . pantoprazole  40 mg Oral BID  . potassium chloride  40 mEq Oral Q6H  . Ensure Max Protein  11 oz Oral QHS  . sodium chloride flush  3 mL Intravenous Q12H  . vancomycin variable dose per  unstable renal function (pharmacist dosing)   Does not apply See admin instructions   Continuous Infusions: . amiodarone 30 mg/hr (03/08/21 1110)  . cefTRIAXone (ROCEPHIN)  IV 2 g (03/08/21 1114)  . heparin 1,650 Units/hr (03/08/21 0839)  . lactated ringers    . lactated ringers Stopped (03/08/21 1112)     LOS: 2 days   Time spent: 1 minutes  Nita Sells, MD [facility] To contact the attending provider between 7A-7P or the covering provider during after hours 7P-7A, please log into the web site [email or web address] and access using universal [place] password for that web site. If you do not have the password, please call the hospital operator.  03/08/2021, 12:26 PM

## 2021-03-08 NOTE — Progress Notes (Signed)
   03/08/21 0843  Vitals  Temp (!) 97.4 F (36.3 C)  Temp Source Oral  BP 100/90  MAP (mmHg) 95  BP Location Right Arm  BP Method Automatic  Patient Position (if appropriate) Lying  Pulse Rate 99  Pulse Rate Source Monitor  ECG Heart Rate (!) 133  Resp 18  MEWS COLOR  MEWS Score Color Red  Oxygen Therapy  SpO2 93 %  O2 Device Nasal Cannula  O2 Flow Rate (L/min) 2 L/min  Pain Assessment  Pain Scale 0-10  Pain Score 9  Pain Type Chronic pain  Pain Location Hand  Pain Orientation Right;Left  Pain Descriptors / Indicators Aching  Pain Frequency Constant  Pain Onset Gradual  Pain Intervention(s) Medication (See eMAR);Heat applied  MEWS Score  MEWS Temp 0  MEWS Systolic 1  MEWS Pulse 3  MEWS RR 0  MEWS LOC 0  MEWS Score 4   Patient is asymptomatic and alert and oriented x4. MEWS score is red due to SBP and increased HR due to patient's infection. Patient's VS are stable and MD is at the bedside. Cardiology on the unit and has been made aware.

## 2021-03-08 NOTE — Progress Notes (Signed)
ABI exam has been completed  Results can be found under chart review under CV PROC. 03/08/2021 4:52 PM Demetre Monaco RVT, RDMS

## 2021-03-08 NOTE — Progress Notes (Signed)
Subjective: 80 year old female admitted to the hospital with concerns of sepsis, infection of her left foot found to be in A. fib.  States that she is feeling somewhat better today.  Gets occasional discomfort of the toe.  Currently denies any fevers or chills.    Objective: AAO x3, NAD DP/PT pulses palpable bilaterally, CRT less than 3 seconds Superficial area skin breakdown on the dorsal aspect left second toe with still erythema to the first MPJ with ascending cellulitis at the foot although it appears to be somewhat improved.  There are still some mild warmth of the foot but overall the cellulitis is regressing.  There is no fluctuation or crepitation.  No significant malodor. No pain with calf compression, erythema or warmth.       Assessment: Superficial abscess left foot, cellulitis; sepsis  Plan: X-rays were negative.  White blood cell count 11.1, lactate 1.6.  Currently afebrile.  At this point no acute surgical intervention and continue IV antibiotics and monitor.  If worsening or any further concerns would recommend MRI.  Dressing changed today.  I cleaned the wound and there is a small piece of loose skin which I debrided today.  Xeroform was applied followed by dressing. Podiatry will continue to follow.  Celesta Gentile, DPM

## 2021-03-08 NOTE — Progress Notes (Signed)
Pharmacy Antibiotic Note  Alisha Terrell is a 80 y.o. female admitted on 03/06/2021 with cellulitis.  Pharmacy has been consulted for Vancomycin dosing.  Vancomycin dosing per random levels initiated due to presenting in AKI. Vancomycin 1500 mg IV loading dose given 5/25 at 1pm. Renal function continues to fluctuate, trending down considerably making scheduling vancomycin unreliable at this time.   Plan: Vancomycin 1250mg  x 1 dose 5/27 am. Further dosing to be determined by renal function and vancomycin level/s.  Re-dose vancomycin when level < 15 mcg/ml or as determined by renal function Suspect will be able to schedule vancomycin doses 5/28 pending AM labs and vancomycin random level.  Height: 4\' 11"  (149.9 cm) Weight: 87.2 kg (192 lb 3.9 oz) IBW/kg (Calculated) : 43.2  Temp (24hrs), Avg:98 F (36.7 C), Min:97.3 F (36.3 C), Max:98.9 F (37.2 C)  Recent Labs  Lab 03/06/21 1133 03/06/21 1201 03/06/21 1305 03/06/21 1701 03/06/21 1952 03/06/21 2345 03/07/21 0250 03/08/21 0735  WBC 9.7  --  17.7*  --   --   --  16.2* 11.1*  CREATININE  --   --  3.41*  --   --   --  2.32* 1.58*  LATICACIDVEN  --  2.2*  --  2.3* 2.7* 2.2*  --   --   VANCORANDOM  --   --   --   --   --   --   --  9    Estimated Creatinine Clearance: 27.3 mL/min (A) (by C-G formula based on SCr of 1.58 mg/dL (H)).    Allergies  Allergen Reactions  . Codeine Rash and Other (See Comments)    Agitation, bad dreams  . Omnicef [Cefdinir] Nausea And Vomiting    Confirmed with patient at bedside that no rash - just had significant GI effects    Antimicrobials this admission: Vancomcyin 5/25 >>  Ceftriaxone 5/25 >>   Microbiology results: 5/25Blood x 2: no growth < 24 hrs to date 5/25 urine: sent 5/25 left toe abscess: abundant Staph aureus, re-incubated 5/25 COVID and flu: neg  Thank you for allowing pharmacy to be a part of this patient's care.  Lorelei Pont, PharmD, BCPS Emergency Medicine  Clinical Pharmacist 03/08/2021 9:36 AM

## 2021-03-08 NOTE — Progress Notes (Addendum)
ANTICOAGULATION CONSULT NOTE - Follow Up Consult  Pharmacy Consult for Heparin Indication: atrial fibrillation  Allergies  Allergen Reactions  . Codeine Rash and Other (See Comments)    Agitation, bad dreams  . Omnicef [Cefdinir] Nausea And Vomiting    Confirmed with patient at bedside that no rash - just had significant GI effects    Patient Measurements: Height: 4\' 11"  (149.9 cm) Weight: 87.2 kg (192 lb 3.9 oz) IBW/kg (Calculated) : 43.2 Heparin Dosing Weight: 62  Vital Signs: Temp: 97.9 F (36.6 C) (05/27 0326) Temp Source: Oral (05/27 0326) BP: 110/65 (05/27 0326) Pulse Rate: 128 (05/27 0759)  Labs: Recent Labs    03/06/21 1201 03/06/21 1305 03/06/21 1305 03/07/21 0250 03/07/21 1229 03/07/21 2205 03/08/21 0735  HGB  --  15.0  --  11.8*  --   --  10.5*  HCT  --  45.9  --  35.0*  --   --  30.4*  PLT  --  185  --  165  --   --  130*  APTT 21*  --   --   --   --   --   --   LABPROT 15.6*  --   --   --   --   --   --   INR 1.2  --   --   --   --   --   --   HEPARINUNFRC  --   --    < > <0.10* <0.10* <0.10* 0.19*  CREATININE  --  3.41*  --  2.32*  --   --   --    < > = values in this interval not displayed.    Estimated Creatinine Clearance: 18.6 mL/min (A) (by C-G formula based on SCr of 2.32 mg/dL (H)).   Medications:  Medications Prior to Admission  Medication Sig Dispense Refill Last Dose  . ADVAIR DISKUS 250-50 MCG/DOSE AEPB Inhale 1 puff into the lungs 2 (two) times daily.    Past Week at Unknown time  . CALCIUM-VITAMIN D PO Take 1 tablet by mouth daily.   Past Week at Unknown time  . Dextromethorphan-Guaifenesin (ROBITUSSIN COUGH/CHEST DM MAX PO) Take 1 mL by mouth 2 (two) times daily as needed (for cold symptoms).    Past Week at Unknown time  . diclofenac Sodium (VOLTAREN) 1 % GEL Apply 2 g topically at bedtime.   Past Week at Unknown time  . fluticasone (FLONASE) 50 MCG/ACT nasal spray Place 1 spray into both nostrils at bedtime.    Past Week at  Unknown time  . furosemide (LASIX) 20 MG tablet Take 20 mg by mouth daily as needed for fluid.   03/03/2021 at unknown time  . losartan (COZAAR) 100 MG tablet Take 100 mg by mouth daily.   03/03/2021 at Unknown time  . montelukast (SINGULAIR) 10 MG tablet Take 10 mg by mouth at bedtime.   Past Week at Unknown time  . Multiple Vitamins-Minerals (CENTRUM SILVER ADULT 50+ PO) Take 1 tablet by mouth daily.   Past Week at Unknown time  . omeprazole (PRILOSEC) 40 MG capsule Take 40 mg by mouth in the morning and at bedtime.    Past Week at Unknown time  . PROAIR HFA 108 (90 Base) MCG/ACT inhaler Inhale 1-2 puffs into the lungs every 6 (six) hours as needed for wheezing or shortness of breath.    Past Week at Unknown time  . traMADol (ULTRAM) 50 MG tablet Take 50 mg by mouth every  6 (six) hours as needed for moderate pain or severe pain.   Past Month at Unknown time  . amLODipine (NORVASC) 5 MG tablet Take 5 mg by mouth daily.   03/04/2021 at unknown time  . aspirin EC 81 MG tablet Take 81 mg by mouth daily.   03/04/2021 at unknown  . atenolol (TENORMIN) 50 MG tablet Take 50 mg by mouth 2 (two) times daily.   03/04/2021 at unknown  . ibandronate (BONIVA) 150 MG tablet Take 150 mg by mouth every 30 (thirty) days.    over 2 months at unknown time   Scheduled:  . diclofenac Sodium  2 g Topical QID  . feeding supplement (GLUCERNA SHAKE)  237 mL Oral BID BM  . metoprolol tartrate  12.5 mg Oral BID  . mometasone-formoterol  2 puff Inhalation BID  . montelukast  10 mg Oral QHS  . multivitamin with minerals  1 tablet Oral Daily  . pantoprazole  40 mg Oral BID  . Ensure Max Protein  11 oz Oral QHS  . sodium chloride flush  3 mL Intravenous Q12H  . vancomycin variable dose per unstable renal function (pharmacist dosing)   Does not apply See admin instructions   Assessment: 11 YOF who presented with Afib, was started on IV heparin. No AC PTA.    Previous heparin levels undetectable and titrated up to 1450  units/hour. 5/27 0730 heparin level = 0.19.  Goal of Therapy:  Heparin level 0.3-0.7 units/ml Monitor platelets by anticoagulation protocol: Yes   Plan:  Give 2500 units bolus x 1 and increase heparin to 1650 units/hour. Re-check heparin level in 8 hours. Monitor daily CBC and heparin levels.  Lorelei Pont, PharmD, BCPS Emergency Medicine Clinical Pharmacist 03/08/2021 8:46 AM

## 2021-03-08 NOTE — Progress Notes (Addendum)
Progress Note  Patient Name: Alisha Terrell Date of Encounter: 03/08/2021  [facility] Cardiologist: Elouise Munroe, MD   Subjective   Patient states she feels better, is trying to eat little more. She had a bad coughing spell earlier. She feels SOB and wheezing today, states her asthma is not usually this bad.  She has not been getting out of bed. She denied any heart palpitation or flutter sensation, dizziness, chest pain. She denied any hematemesis, melena, hemoptysis. She states there was some pus coming of her left foot wound but no excessive bleeding were noted.   Inpatient Medications    Scheduled Meds: . diclofenac Sodium  2 g Topical QID  . feeding supplement (GLUCERNA SHAKE)  237 mL Oral BID BM  . metoprolol tartrate  12.5 mg Oral BID  . mometasone-formoterol  2 puff Inhalation BID  . montelukast  10 mg Oral QHS  . multivitamin with minerals  1 tablet Oral Daily  . pantoprazole  40 mg Oral BID  . potassium chloride  40 mEq Oral Q6H  . Ensure Max Protein  11 oz Oral QHS  . sodium chloride flush  3 mL Intravenous Q12H  . vancomycin variable dose per unstable renal function (pharmacist dosing)   Does not apply See admin instructions   Continuous Infusions: . amiodarone 30 mg/hr (03/07/21 2329)  . cefTRIAXone (ROCEPHIN)  IV 2 g (03/07/21 1201)  . diltiazem (CARDIZEM) infusion Stopped (03/07/21 0105)  . heparin 1,650 Units/hr (03/08/21 0839)  . lactated ringers    . lactated ringers 100 mL/hr at 03/08/21 0324  . vancomycin     PRN Meds: acetaminophen **OR** acetaminophen, albuterol, ondansetron **OR** ondansetron (ZOFRAN) IV, traMADol   Vital Signs    Vitals:   03/07/21 2340 03/08/21 0326 03/08/21 0759 03/08/21 0843  BP: (!) 95/48 110/65  100/90  Pulse:  (!) 123 (!) 128 99  Resp: 20 20 (!) 22 18  Temp: (!) 97.3 F (36.3 C) 97.9 F (36.6 C)  (!) 97.4 F (36.3 C)  TempSrc: Oral Oral  Oral  SpO2: 93% 95%  93%  Weight:  87.2 kg    Height:         Intake/Output Summary (Last 24 hours) at 03/08/2021 5784 Last data filed at 03/08/2021 0324 Gross per 24 hour  Intake 4120.88 ml  Output 350 ml  Net 3770.88 ml   Last 3 Weights 03/08/2021 03/06/2021 03/06/2021  Weight (lbs) 192 lb 3.9 oz 180 lb 8.9 oz 175 lb  Weight (kg) 87.2 kg 81.9 kg 79.379 kg      Telemetry    A fib with RVR 130-140s- Personally Reviewed  ECG    No new tracing today- Personally Reviewed  Physical Exam   GEN: No acute distress. Lying in bed.  Neck: Short and thick neck.  Cardiac: Irregularly irregular, no murmurs, rubs, or gallops.  Respiratory: Expiratory wheezing. On room air. Speaks full sentence.  GI: Soft, nontender, non-distended  MS: No edema; No deformity. Neuro:  Alert and oriented x3, follow commands appropriately, no focal deficit  Psych: Normal affect  Left foot with dressing in place, not interrupted for exam   Labs    High Sensitivity Troponin:  No results for input(s): TROPONINIHS in the last 720 hours.    Chemistry Recent Labs  Lab 03/06/21 1305 03/07/21 0250 03/08/21 0735  NA 136 135 133*  K 3.4* 3.4* 3.4*  CL 99 103 106  CO2 19* 19* 19*  GLUCOSE 135* 120* 121*  BUN  55* 53* 54*  CREATININE 3.41* 2.32* 1.58*  CALCIUM 8.2* 7.4* 7.5*  PROT 6.3*  --  4.3*  ALBUMIN 3.0*  --  2.0*  AST 26  --  19  ALT 23  --  19  ALKPHOS 49  --  38  BILITOT 1.2  --  0.5  GFRNONAA 13* 21* 33*  ANIONGAP 18* 13 8     Hematology Recent Labs  Lab 03/06/21 1305 03/07/21 0250 03/08/21 0735  WBC 17.7* 16.2* 11.1*  RBC 5.20* 4.04 3.59*  HGB 15.0 11.8* 10.5*  HCT 45.9 35.0* 30.4*  MCV 88.3 86.6 84.7  MCH 28.8 29.2 29.2  MCHC 32.7 33.7 34.5  RDW 14.7 14.9 15.0  PLT 185 165 130*    BNPNo results for input(s): BNP, PROBNP in the last 168 hours.   DDimer No results for input(s): DDIMER in the last 168 hours.   Radiology    DG Chest Port 1 View  Result Date: 03/06/2021 CLINICAL DATA:  Atrial fibrillation EXAM: PORTABLE CHEST 1 VIEW  COMPARISON:  06/13/2020 FINDINGS: Low lung volumes. Probable central bronchiectasis. Mild interstitial prominence. No significant pleural effusion. No pneumothorax. Cardiomediastinal contours are likely within normal limits for technique. IMPRESSION: Mild interstitial prominence, which may reflect chronic changes or edema. Electronically Signed   By: Macy Mis M.D.   On: 03/06/2021 12:53   DG Foot Complete Left  Result Date: 03/06/2021 CLINICAL DATA:  Left foot ulcer and pain. EXAM: LEFT FOOT - COMPLETE 3+ VIEW COMPARISON:  August 29, 2019. FINDINGS: There is no evidence of fracture or dislocation. There is no evidence of arthropathy or other focal bone abnormality. Soft tissues are unremarkable. IMPRESSION: Negative. Electronically Signed   By: Marijo Conception M.D.   On: 03/06/2021 18:53    Cardiac Studies   24 hour event monitor from 08/07/20:  Minimum HR (bpm): 45, burden of bradycardia appears on average to be 20-30% of the time.  Maximum HR (bpm): 111 Supraventricular Ectopy: <1% SVT: none Ventricular Ectopy: <1% NSVT: none Ventricular Tachycardia: none Pauses: none AV block: first degree AVB appears consistently.  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 No Afib, no pauses. Infrequent ectopy. No diary events.    Echo from 08/17/20:  1. Left ventricular ejection fraction, by estimation, is 60 to 65%. The  left ventricle has normal function. The left ventricle has no regional  wall motion abnormalities. There is mild concentric left ventricular  hypertrophy. Left ventricular diastolic  parameters are consistent with Grade I diastolic dysfunction (impaired  relaxation). The average left ventricular global longitudinal strain is  -23.9 %. The global longitudinal strain is normal.  2. Right ventricular systolic function is normal. The right ventricular  size is normal.  3. The mitral  valve is normal in structure. Trivial mitral valve  regurgitation.  4. The aortic valve is tricuspid. Aortic valve regurgitation is not  visualized.  5. There is mild dilatation of the ascending aorta, measuring 38 mm.  6. The inferior vena cava is normal in size with <50% respiratory  variability, suggesting right atrial pressure of 8 mmHg.   Comparison(s): Compared to prior TTE in 2017, there is no significant  change.    Patient Profile     80 yo female with history of HTN, asthma, TIA, OSA on CPAP,obesity, chronic back pain, who presented to the ER with c/o weakness, nausea, vomiting, heart palpitation, and nothing to eat and drink for 3  days, found to have sepsis with lactic acidosis >2 2/2 presumed toe blister with infection, AKI, and A. fib with RVR. Cardiology is consulted and  following for afib.  Assessment & Plan    New onset A fib with RVR - follows with Dr Margaretann Loveless outpatient for heart palpitation, 08/07/20 30 days event monitor showed sinus rhythm with first degree AVB with frequent sinus bradycardia. No Afib, no pauses. Infrequent ectopy. No diary events.   -presented with sepsis 2/2 toe infection, with new onset A fib RVR - likely acute illness mediated A fib RVR - started on amiodarone gtt by PCCM at admission due to hypotension, diltiazem gtt was added later due to inadequate rate control, BP low and Cardizem gtt has been stopped since 03/07/21, rate is not controlled this morning with RVR at 140s despite amiodarone gtt + metoprolol 12.5mg  BID, BP not allowing up-titration of AV blocking agents, consider digoxin load or re-bolus of amiodarone, will defer to attending  - CHA2DS2VASc score is 6 due to age, gender, HTN, TIA; recommend anticoagulation, started on heparin gtt, may transition to Eliquis when AKI recovers  - replete K >4 and Mag >2, K 3.4, added K 80 MEQ supplement today, Mag at goal  - will check TSH/free T4 , lactic acid, and Echo today   Sepsis 2/2 left 2nd  toe abscess with cellulitis  - presented with weakness, N/V, poor PO intake, hypotension, tachycardia, left 2nd toe bulla with surrounding erythema; leukocytosis, lactic acidosis >2,  and AKI, POA  - see by podiatry, s/p bedside I&D with purulent drainage  - Blood Cx no sent, Wound culture no growth so far  - currently on IV CTX +Vanco , managed per primary team   AKI/ATN - due to sepsis  - Renal index improving with IVF, IVF has been reduced, clinically appears dry, will repeat lactic acid,  would continue IVF resuscitation if CXR without pulmonary edema  - monitor UOP/remains low 323ml for 24 hours, trend BMP daily   Hx of HTN Hypotension  - home med include amlodipine, atenolol, lasix, losartan  held for hypotension currently   OSA - CPAP at night   Asthma - Expiratory wheezing noted with cough spells, off oxygen, pox 95%, CXR repeated today and pending,  managed per primary team       For questions or updates, please contact [facility] Please consult [email or web address] for contact info under        Signed, Margie Billet, NP  03/08/2021, 9:22 AM

## 2021-03-08 NOTE — Progress Notes (Signed)
Pt refusing CPAP at this time.

## 2021-03-08 NOTE — Progress Notes (Signed)
ANTICOAGULATION CONSULT NOTE - Follow Up Consult  Pharmacy Consult for Heparin Indication: atrial fibrillation  Allergies  Allergen Reactions  . Codeine Rash and Other (See Comments)    Agitation, bad dreams  . Omnicef [Cefdinir] Nausea And Vomiting    Confirmed with patient at bedside that no rash - just had significant GI effects    Patient Measurements: Height: 4\' 11"  (149.9 cm) Weight: 87.2 kg (192 lb 3.9 oz) IBW/kg (Calculated) : 43.2 Heparin Dosing Weight: 62  Vital Signs: Temp: 97.9 F (36.6 C) (05/27 1701) Temp Source: Oral (05/27 1701) BP: 108/55 (05/27 1701) Pulse Rate: 98 (05/27 1701)  Labs: Recent Labs    03/06/21 1201 03/06/21 1305 03/07/21 0250 03/07/21 1229 03/07/21 2205 03/08/21 0735 03/08/21 1758  HGB  --  15.0 11.8*  --   --  10.5*  --   HCT  --  45.9 35.0*  --   --  30.4*  --   PLT  --  185 165  --   --  130*  --   APTT 21*  --   --   --   --   --   --   LABPROT 15.6*  --   --   --   --   --   --   INR 1.2  --   --   --   --   --   --   HEPARINUNFRC  --   --  <0.10*   < > <0.10* 0.19* 0.28*  CREATININE  --  3.41* 2.32*  --   --  1.58*  --    < > = values in this interval not displayed.    Estimated Creatinine Clearance: 27.3 mL/min (A) (by C-G formula based on SCr of 1.58 mg/dL (H)).   Medications:  Medications Prior to Admission  Medication Sig Dispense Refill Last Dose  . ADVAIR DISKUS 250-50 MCG/DOSE AEPB Inhale 1 puff into the lungs 2 (two) times daily.    Past Week at Unknown time  . CALCIUM-VITAMIN D PO Take 1 tablet by mouth daily.   Past Week at Unknown time  . Dextromethorphan-Guaifenesin (ROBITUSSIN COUGH/CHEST DM MAX PO) Take 1 mL by mouth 2 (two) times daily as needed (for cold symptoms).    Past Week at Unknown time  . diclofenac Sodium (VOLTAREN) 1 % GEL Apply 2 g topically at bedtime.   Past Week at Unknown time  . fluticasone (FLONASE) 50 MCG/ACT nasal spray Place 1 spray into both nostrils at bedtime.    Past Week at Unknown  time  . furosemide (LASIX) 20 MG tablet Take 20 mg by mouth daily as needed for fluid.   03/03/2021 at unknown time  . losartan (COZAAR) 100 MG tablet Take 100 mg by mouth daily.   03/03/2021 at Unknown time  . montelukast (SINGULAIR) 10 MG tablet Take 10 mg by mouth at bedtime.   Past Week at Unknown time  . Multiple Vitamins-Minerals (CENTRUM SILVER ADULT 50+ PO) Take 1 tablet by mouth daily.   Past Week at Unknown time  . omeprazole (PRILOSEC) 40 MG capsule Take 40 mg by mouth in the morning and at bedtime.    Past Week at Unknown time  . PROAIR HFA 108 (90 Base) MCG/ACT inhaler Inhale 1-2 puffs into the lungs every 6 (six) hours as needed for wheezing or shortness of breath.    Past Week at Unknown time  . traMADol (ULTRAM) 50 MG tablet Take 50 mg by mouth every 6 (six)  hours as needed for moderate pain or severe pain.   Past Month at Unknown time  . amLODipine (NORVASC) 5 MG tablet Take 5 mg by mouth daily.   03/04/2021 at unknown time  . aspirin EC 81 MG tablet Take 81 mg by mouth daily.   03/04/2021 at unknown  . atenolol (TENORMIN) 50 MG tablet Take 50 mg by mouth 2 (two) times daily.   03/04/2021 at unknown  . ibandronate (BONIVA) 150 MG tablet Take 150 mg by mouth every 30 (thirty) days.    over 2 months at unknown time   Scheduled:  . diclofenac Sodium  2 g Topical QID  . feeding supplement (GLUCERNA SHAKE)  237 mL Oral BID BM  . metoprolol tartrate  12.5 mg Oral BID  . mometasone-formoterol  2 puff Inhalation BID  . montelukast  10 mg Oral QHS  . multivitamin with minerals  1 tablet Oral Daily  . pantoprazole  40 mg Oral BID  . Ensure Max Protein  11 oz Oral QHS  . sodium chloride flush  3 mL Intravenous Q12H  . vancomycin variable dose per unstable renal function (pharmacist dosing)   Does not apply See admin instructions   Assessment: 51 YOF who presented with Afib, was started on IV heparin. No AC PTA.    Heparin level came back slightly subtherapeutic at 0.28, on 1650 units/hr.  No s/sx of bleeding or infusion issues.   Goal of Therapy:  Heparin level 0.3-0.7 units/ml Monitor platelets by anticoagulation protocol: Yes   Plan:  Increase heparin to 1800 units/hour to get into goal range  Monitor daily CBC and heparin levels.  Antonietta Jewel, PharmD, [place] Clinical Pharmacist  Phone: [phone] 03/08/2021 6:47 PM  Please check AMION for all [address] phone numbers After 10:00 PM, call [place] [phone]

## 2021-03-09 DIAGNOSIS — I4891 Unspecified atrial fibrillation: Secondary | ICD-10-CM | POA: Diagnosis not present

## 2021-03-09 DIAGNOSIS — L97521 Non-pressure chronic ulcer of other part of left foot limited to breakdown of skin: Secondary | ICD-10-CM

## 2021-03-09 LAB — HEPARIN LEVEL (UNFRACTIONATED): Heparin Unfractionated: 0.32 IU/mL (ref 0.30–0.70)

## 2021-03-09 LAB — COMPREHENSIVE METABOLIC PANEL
ALT: 21 U/L (ref 0–44)
AST: 16 U/L (ref 15–41)
Albumin: 2 g/dL — ABNORMAL LOW (ref 3.5–5.0)
Alkaline Phosphatase: 42 U/L (ref 38–126)
Anion gap: 6 (ref 5–15)
BUN: 41 mg/dL — ABNORMAL HIGH (ref 8–23)
CO2: 20 mmol/L — ABNORMAL LOW (ref 22–32)
Calcium: 7.8 mg/dL — ABNORMAL LOW (ref 8.9–10.3)
Chloride: 106 mmol/L (ref 98–111)
Creatinine, Ser: 1.17 mg/dL — ABNORMAL HIGH (ref 0.44–1.00)
GFR, Estimated: 47 mL/min — ABNORMAL LOW (ref >60)
Glucose, Bld: 112 mg/dL — ABNORMAL HIGH (ref 70–99)
Potassium: 4 mmol/L (ref 3.5–5.1)
Sodium: 132 mmol/L — ABNORMAL LOW (ref 135–145)
Total Bilirubin: 0.5 mg/dL (ref 0.3–1.2)
Total Protein: 4.3 g/dL — ABNORMAL LOW (ref 6.5–8.1)

## 2021-03-09 LAB — APTT: aPTT: 20 seconds — ABNORMAL LOW (ref 24–36)

## 2021-03-09 LAB — CBC
HCT: 30.2 % — ABNORMAL LOW (ref 36.0–46.0)
Hemoglobin: 10.3 g/dL — ABNORMAL LOW (ref 12.0–15.0)
MCH: 29.1 pg (ref 26.0–34.0)
MCHC: 34.1 g/dL (ref 30.0–36.0)
MCV: 85.3 fL (ref 80.0–100.0)
Platelets: 117 10*3/uL — ABNORMAL LOW (ref 150–400)
RBC: 3.54 MIL/uL — ABNORMAL LOW (ref 3.87–5.11)
RDW: 15.2 % (ref 11.5–15.5)
WBC: 8.8 10*3/uL (ref 4.0–10.5)
nRBC: 0 % (ref 0.0–0.2)

## 2021-03-09 LAB — VANCOMYCIN, RANDOM: Vancomycin Rm: 14

## 2021-03-09 MED ORDER — LEVALBUTEROL TARTRATE 45 MCG/ACT IN AERO
1.0000 | INHALATION_SPRAY | Freq: Four times a day (QID) | RESPIRATORY_TRACT | Status: DC | PRN
  Administered 2021-03-09 – 2021-03-10 (×2): 2 via RESPIRATORY_TRACT
  Filled 2021-03-09: qty 15

## 2021-03-09 MED ORDER — METOPROLOL TARTRATE 25 MG PO TABS
25.0000 mg | ORAL_TABLET | Freq: Two times a day (BID) | ORAL | Status: DC
  Administered 2021-03-09 – 2021-03-10 (×4): 25 mg via ORAL
  Filled 2021-03-09 (×4): qty 1

## 2021-03-09 MED ORDER — DOXYCYCLINE HYCLATE 100 MG PO TABS
100.0000 mg | ORAL_TABLET | Freq: Two times a day (BID) | ORAL | Status: DC
  Administered 2021-03-09 – 2021-03-15 (×13): 100 mg via ORAL
  Filled 2021-03-09 (×13): qty 1

## 2021-03-09 MED ORDER — PREDNISONE 20 MG PO TABS
40.0000 mg | ORAL_TABLET | Freq: Every day | ORAL | Status: DC
  Administered 2021-03-10 – 2021-03-12 (×3): 40 mg via ORAL
  Filled 2021-03-09 (×3): qty 2

## 2021-03-09 MED ORDER — DEXTROMETHORPHAN POLISTIREX ER 30 MG/5ML PO SUER
15.0000 mg | Freq: Two times a day (BID) | ORAL | Status: DC
  Administered 2021-03-09 – 2021-03-15 (×13): 15 mg via ORAL
  Filled 2021-03-09 (×14): qty 5

## 2021-03-09 NOTE — Progress Notes (Signed)
Subjective: 80 year old female admitted to the hospital with sepsis, infection of her left foot found to be in A. fib.  Still in A. fib on heparin drip waiting to see if any procedures need to be performed.  No significant pain in the toe.  Denies any fevers or chills.  Currently no nausea or vomiting.  Objective: AAO x3, NAD-sitting in bed eating breakfast DP/PT pulses palpable bilaterally, CRT less than 3 seconds Superficial area skin breakdown on the dorsal aspect left second toe with still erythema to the first MPJ with ascending cellulitis at the foot although is slowly improving.  There is no fluctuation or crepitation.  Upon removal of the bandage today there is minimal amount of purulence was identified by the this is more superficial and there is no deep wound identified.  This is where there was some residual skin from the blister which was removed.  Upon cleaning the wound there is no further purulence identified. No pain with calf compression, erythema or warmth.           Assessment: Superficial abscess left foot, cellulitis; sepsis  Plan: X-rays were negative.  White blood cell count has normalized today to 8.8.  She is also afebrile.  For now continue IV antibiotics and no acute surgical intervention is needed at this time.  However if the erythema not improving more in the next day would recommend MRI.  Elevation.  Dressing was changed today and Xeroform was applied followed by dry sterile dressing.  Betadine was applied interdigitally. Podiatry will continue to follow.  Celesta Gentile, DPM

## 2021-03-09 NOTE — Progress Notes (Signed)
PROGRESS NOTE   Alisha Terrell  VOH:[id number] DOB: [birth year] DOA: 03/06/2021 PCP: Christain [place], MD  Brief Narrative:  80 year old community dwelling white female Known history of migraines followed by Dr. Tomi Likens of neurology-TIA [phone] 10/12/2015 Obstructive sleep apnea followed right coronary [facility] [place] (AutoPap 5-10) Asthma COVID recovered 07/02/2020 Dry weight about 184 pounds Follows locally with cardiology-event monitor shows 20 to 30% bradycardia burden-no A. fib was noted  Presented to [facility] with several days of malaise weakness fatigue anorexia in the setting of having swelling of the toe about a week prior to coming to the ED ED with heart rates in the 200s blood pressure 92 O2 sat 88% up to 98% on 3 L  Lactic acid 2.2, BUN/creatinine up from baseline 255/3.4 Anion gap 18 WBC 17  Patient given 2.5 L crystalloid-amiodarone started-empiric vancomycin ceftriaxone started Found to have left second toe with bullae Critical care evaluated felt severe sepsis without shock Podiatrist consulted-unroofed the blister obtain cultures and got x-rays  Hospital-Problem based course  Severe sepsis secondary to left foot cellulitis/bullae de-escalate to doxycycline today as Staphylococcus and wound culture from admission Defer to podiatry operative management--does not seem like will need operation Pain control tramadol 50 every 6 as needed New onset A. fib RVR CHADS2 score >4/heparin currently note Cardizem DC 2/2 hypotension-HR still uncontrolled Current meds: Metoprolol 25 twice daily, amiodarone at 30 mg/H ?  Cardioversion per cardiology-keep heparin at this time Follow PTT Monitor platelets carefully HTN Holding losartan 100, Lasix 20, amlodipine OSA/CPAP AutoPap settings 5-10 Ordered at bedtime mandatory CPAP Symptomatic management Complicated migraines/prior TIA followed by Dr. Tomi Likens neurology Not currently on any meds Continue aspirin 81  GDMT Obstructive asthma followed by [facility] CXR repeat 5/27 no active disease Coughing incessantly 5/28-no rales therefore starting prednisone 40, change albuterol to Xopenex to see if this helps with her tachycardia Continue Singulair 10, ProAir/albuterol nebs, Flonase Resume dextromethorphan  DVT prophylaxis: Heparin Code Status: Full Family Communication: No family present today Disposition:  Status is: Inpatient  Remains inpatient appropriate because:Hemodynamically unstable, Unsafe d/c plan and IV treatments appropriate due to intensity of illness or inability to take PO   Dispo: The patient is from: Home              Anticipated d/c is to: Unclear at this time              Patient currently is not medically stable to d/c.   Difficult to place patient No       Consultants:   Cardiology  Critical care  Procedures: Multiple  Antimicrobials: As above   Subjective:  Coughing incessantly Does have some wheeze No chest pain Overall feels better Heart rates in the 150s but has not received her metoprolol this morning yet   Objective: Vitals:   03/09/21 0827 03/09/21 0903 03/09/21 0923 03/09/21 0925  BP:  (!) 85/71 121/79 121/79  Pulse:  86 (!) 133 (!) 130  Resp:  20  18  Temp:  97.7 F (36.5 C)  97.7 F (36.5 C)  TempSrc:  Oral    SpO2: 97% 99%    Weight:      Height:        Intake/Output Summary (Last 24 hours) at 03/09/2021 0956 Last data filed at 03/09/2021 0912 Gross per 24 hour  Intake 1788.33 ml  Output 1850 ml  Net -61.67 ml   Filed Weights   03/06/21 2057 03/08/21 0326 03/09/21 0410  Weight: 81.9  kg 87.2 kg 86.9 kg    Examination:  Short of breath pleasant On oxygen S1-S2 A. fib Abdomen obese nontender no rebound No lower extremity swelling Neurologically intact   Data Reviewed: personally reviewed   Potassium 3.4--> 4.0 BUNs/creatinine 55/3.4-->53/2.3-->54/1.5--> 41/1.1 CO2 19--> 20 White count  17.7-->16.2-->11.1--> 8.8 Hemoglobin 15.0-->11.8-->10.5--> 10.3 Platelet 185-->165-->130--> 117 Foot x-rays   [-]  Radiology Studies: DG CHEST PORT 1 VIEW  Result Date: 03/08/2021 CLINICAL DATA:  Pneumonia EXAM: PORTABLE CHEST 1 VIEW COMPARISON:  Two days ago and 06/13/2020 FINDINGS: Low volume chest with streaky density at the bases. Borderline heart size with stable aortic tortuosity. Artifact from EKG leads. No effusion or pneumothorax. Calcification over the right upper quadrant. IMPRESSION: Stable chest when compared to 2021.  No acute finding. Electronically Signed   By: Monte Fantasia M.D.   On: 03/08/2021 09:51   VAS [place] ABI WITH/WO TBI  Result Date: 03/08/2021  LOWER EXTREMITY DOPPLER STUDY Patient Name:  Alisha Terrell  Date of Exam:   03/08/2021 Medical Rec #: [id number]           Accession #:    [id number] Date of Birth: July 09, 1941           Patient Gender: F Patient Age:   79Y Exam Location[facility]  [facility] Procedure:      VAS [place] ABI WITH/WO TBI Referring Phys: [id number] JENNIFER YATES --------------------------------------------------------------------------------  Indications: Ulceration. High Risk Factors: Hypertension, no history of smoking. Other Factors: TIA, Afib.  Comparison Study: No previous exams Performing Technologist: Hill, Jody RVT, RDMS  Examination Guidelines: A complete evaluation includes at minimum, Doppler waveform signals and systolic blood pressure reading at the level of bilateral brachial, anterior tibial, and posterior tibial arteries, when vessel segments are accessible. Bilateral testing is considered an integral part of a complete examination. Photoelectric Plethysmograph (PPG) waveforms and toe systolic pressure readings are included as required and additional duplex testing as needed. Limited examinations for reoccurring indications may be performed as noted.  ABI Findings: +---------+------------------+-----+---------+--------+ Right    Rt Pressure  (mmHg)IndexWaveform Comment  +---------+------------------+-----+---------+--------+ Brachial 113                    triphasic         +---------+------------------+-----+---------+--------+ PTA      108               0.96 triphasic         +---------+------------------+-----+---------+--------+ DP       106               0.94 triphasic         +---------+------------------+-----+---------+--------+ Great Toe101               0.89 Normal            +---------+------------------+-----+---------+--------+ +---------+------------------+-----+----------------+-------+ Left     Lt Pressure (mmHg)IndexWaveform        Comment +---------+------------------+-----+----------------+-------+ Brachial                        triphasic       IV      +---------+------------------+-----+----------------+-------+ PTA      127               1.12 biphasic                +---------+------------------+-----+----------------+-------+ DP       104  0.92 Audibly biphasic        +---------+------------------+-----+----------------+-------+ Great Toe49                0.43 Abnormal                +---------+------------------+-----+----------------+-------+ +-------+-----------+-----------+------------+------------+ ABI/TBIToday's ABIToday's TBIPrevious ABIPrevious TBI +-------+-----------+-----------+------------+------------+ Right  0.96       0.89                                +-------+-----------+-----------+------------+------------+ Left   1.12       0.43                                +-------+-----------+-----------+------------+------------+  Doppler waveforms difficult to obtain due to patient being in constant Afib .  Summary: Right: Resting right ankle-brachial index is within normal range. No evidence of significant right lower extremity arterial disease. The right toe-brachial index is normal. Left: Resting left ankle-brachial index is  within normal range. No evidence of significant left lower extremity arterial disease. The left toe-brachial index is abnormal.  *See table(s) above for measurements and observations.     Preliminary    ECHOCARDIOGRAM COMPLETE  Result Date: 03/08/2021    ECHOCARDIOGRAM REPORT   Patient Name:   MONIKA CHESTANG Date of Exam: 03/08/2021 Medical Rec #:  [id number]          Height:       59.0 in Accession #:    [id number]         Weight:       192.2 lb Date of Birth:  1941/06/25          BSA:          1.814 m Patient Age:    8 years           BP:           94/77 mmHg Patient Gender: F                  HR:           125 bpm. Exam Location:  Inpatient Procedure: 2D Echo, Color Doppler and Cardiac Doppler Indications:    Atrial fibrillation  History:        Patient has prior history of Echocardiogram examinations, most                 recent 08/17/2020. Arrythmias:Atrial Fibrillation; Risk                 Factors:Hypertension.  Sonographer:    [place] Referring Phys: [id number] Margie Billet  Sonographer Comments: Patient is morbidly obese. Image acquisition challenging due to patient body habitus. IMPRESSIONS  1. Left ventricular ejection fraction, by estimation, is 55 to 60%. The left ventricle has normal function. The left ventricle has no regional wall motion abnormalities. There is mild concentric left ventricular hypertrophy. Left ventricular diastolic function could not be evaluated.  2. Right ventricular systolic function is normal. The right ventricular size is normal. There is mildly elevated pulmonary artery systolic pressure. The estimated right ventricular systolic pressure is 16.6 mmHg.  3. Left atrial size was mild to moderately dilated.  4. The mitral valve is grossly normal. Trivial mitral valve regurgitation. No evidence of mitral stenosis.  5. The aortic valve is tricuspid. Aortic valve regurgitation is not visualized. No aortic stenosis is present.  6.  The inferior vena cava is dilated in size  with <50% respiratory variability, suggesting right atrial pressure of 15 mmHg. Comparison(s): Changes from prior study are noted. Afib is now present. FINDINGS  Left Ventricle: Left ventricular ejection fraction, by estimation, is 55 to 60%. The left ventricle has normal function. The left ventricle has no regional wall motion abnormalities. The left ventricular internal cavity size was normal in size. There is  mild concentric left ventricular hypertrophy. Left ventricular diastolic function could not be evaluated due to atrial fibrillation. Left ventricular diastolic function could not be evaluated. Right Ventricle: The right ventricular size is normal. No increase in right ventricular wall thickness. Right ventricular systolic function is normal. There is mildly elevated pulmonary artery systolic pressure. The tricuspid regurgitant velocity is 2.65  m/s, and with an assumed right atrial pressure of 15 mmHg, the estimated right ventricular systolic pressure is 14.9 mmHg. Left Atrium: Left atrial size was mild to moderately dilated. Right Atrium: Right atrial size was normal in size. Pericardium: Trivial pericardial effusion is present. Mitral Valve: The mitral valve is grossly normal. Trivial mitral valve regurgitation. No evidence of mitral valve stenosis. Tricuspid Valve: The tricuspid valve is grossly normal. Tricuspid valve regurgitation is trivial. No evidence of tricuspid stenosis. Aortic Valve: The aortic valve is tricuspid. Aortic valve regurgitation is not visualized. No aortic stenosis is present. Aortic valve mean gradient measures 6.0 mmHg. Aortic valve peak gradient measures 9.1 mmHg. Aortic valve area, by VTI measures 1.97 cm. Pulmonic Valve: The pulmonic valve was grossly normal. Pulmonic valve regurgitation is not visualized. No evidence of pulmonic stenosis. Aorta: The aortic root and ascending aorta are structurally normal, with no evidence of dilitation. Venous: The inferior vena cava is  dilated in size with less than 50% respiratory variability, suggesting right atrial pressure of 15 mmHg. IAS/Shunts: The atrial septum is grossly normal.  LEFT VENTRICLE PLAX 2D LVIDd:         4.70 cm     Diastology LV PW:         1.00 cm     LV e' medial:    6.85 cm/s LV IVS:        1.20 cm     LV E/e' medial:  16.4 LVOT diam:     2.00 cm     LV e' lateral:   13.10 cm/s LV SV:         45          LV E/e' lateral: 8.5 LV SV Index:   25 LVOT Area:     3.14 cm  LV Volumes (MOD) LV vol d, MOD A2C: 35.1 ml LV vol d, MOD A4C: 51.2 ml LV vol s, MOD A2C: 16.7 ml LV vol s, MOD A4C: 27.2 ml LV SV MOD A2C:     18.4 ml LV SV MOD A4C:     51.2 ml LV SV MOD BP:      21.7 ml RIGHT VENTRICLE RV Basal diam:  3.40 cm RV Mid diam:    3.20 cm RV S prime:     15.70 cm/s TAPSE (M-mode): 1.8 cm LEFT ATRIUM             Index       RIGHT ATRIUM           Index LA diam:        4.20 cm 2.32 cm/m  RA Area:     15.90 cm LA Vol (A2C):   88.9 ml 49.02 ml/m RA Volume:  41.50 ml  22.88 ml/m LA Vol (A4C):   71.9 ml 39.64 ml/m LA Biplane Vol: 80.9 ml 44.61 ml/m  AORTIC VALVE                    PULMONIC VALVE AV Area (Vmax):    2.16 cm     PV Vmax:       0.95 m/s AV Area (Vmean):   1.73 cm     PV Vmean:      67.900 cm/s AV Area (VTI):     1.97 cm     PV VTI:        0.151 m AV Vmax:           151.00 cm/s  PV Peak grad:  3.6 mmHg AV Vmean:          122.000 cm/s PV Mean grad:  2.0 mmHg AV VTI:            0.228 m AV Peak Grad:      9.1 mmHg AV Mean Grad:      6.0 mmHg LVOT Vmax:         104.00 cm/s LVOT Vmean:        67.300 cm/s LVOT VTI:          0.143 m LVOT/AV VTI ratio: 0.63  AORTA Ao Root diam: 3.10 cm Ao Asc diam:  3.40 cm MV E velocity: 112.00 cm/s  TRICUSPID VALVE                             TR Peak grad:   28.1 mmHg                             TR Vmax:        265.00 cm/s                              SHUNTS                             Systemic VTI:  0.14 m                             Systemic Diam: 2.00 cm Eleonore Chiquito MD  Electronically signed by Eleonore Chiquito MD Signature Date/Time: 03/08/2021/4:51:33 PM    Final      Scheduled Meds: . diclofenac Sodium  2 g Topical QID  . feeding supplement (GLUCERNA SHAKE)  237 mL Oral BID BM  . metoprolol tartrate  25 mg Oral BID  . mometasone-formoterol  2 puff Inhalation BID  . montelukast  10 mg Oral QHS  . multivitamin with minerals  1 tablet Oral Daily  . pantoprazole  40 mg Oral BID  . [START ON 03/10/2021] predniSONE  40 mg Oral QAC breakfast  . Ensure Max Protein  11 oz Oral QHS  . sodium chloride flush  3 mL Intravenous Q12H  . vancomycin variable dose per unstable renal function (pharmacist dosing)   Does not apply See admin instructions   Continuous Infusions: . amiodarone 30 mg/hr (03/08/21 1110)  . cefTRIAXone (ROCEPHIN)  IV 2 g (03/08/21 1114)  . heparin 1,800 Units/hr (03/08/21 1857)  . lactated ringers    . lactated ringers Stopped (03/08/21 1112)     LOS:  3 days   Time spent: 45 minutes  Nita Sells, MD [facility] To contact the attending provider between 7A-7P or the covering provider during after hours 7P-7A, please log into the web site [email or web address] and access using universal [place] password for that web site. If you do not have the password, please call the hospital operator.  03/09/2021, 9:56 AM

## 2021-03-09 NOTE — Progress Notes (Signed)
CPAP brought to room for patient. Patient refused due to being nauseous and stating she doesn't think she can wear it tonight. RN aware and will call if patient decides to wear and nausea has subsided.

## 2021-03-09 NOTE — Progress Notes (Signed)
Progress Note  Patient Name: Alisha Terrell Date of Encounter: 03/09/2021  [facility] Cardiologist: Elouise Munroe, MD   Subjective   No CP or dyspnea  Inpatient Medications    Scheduled Meds: . diclofenac Sodium  2 g Topical QID  . feeding supplement (GLUCERNA SHAKE)  237 mL Oral BID BM  . metoprolol tartrate  12.5 mg Oral BID  . mometasone-formoterol  2 puff Inhalation BID  . montelukast  10 mg Oral QHS  . multivitamin with minerals  1 tablet Oral Daily  . pantoprazole  40 mg Oral BID  . Ensure Max Protein  11 oz Oral QHS  . sodium chloride flush  3 mL Intravenous Q12H  . vancomycin variable dose per unstable renal function (pharmacist dosing)   Does not apply See admin instructions   Continuous Infusions: . amiodarone 30 mg/hr (03/08/21 1110)  . cefTRIAXone (ROCEPHIN)  IV 2 g (03/08/21 1114)  . heparin 1,800 Units/hr (03/08/21 1857)  . lactated ringers    . lactated ringers Stopped (03/08/21 1112)   PRN Meds: acetaminophen **OR** acetaminophen, albuterol, ondansetron **OR** ondansetron (ZOFRAN) IV, traMADol   Vital Signs    Vitals:   03/08/21 1701 03/08/21 2002 03/08/21 2355 03/09/21 0410  BP: (!) 108/55 117/64 (!) 119/55 107/64  Pulse: 98 (!) 121 (!) 115 84  Resp: 18 20 18  (!) 21  Temp: 97.9 F (36.6 C) 97.6 F (36.4 C) 97.9 F (36.6 C) 97.8 F (36.6 C)  TempSrc: Oral Oral Oral Oral  SpO2: 90% 99% 100% 98%  Weight:    86.9 kg  Height:        Intake/Output Summary (Last 24 hours) at 03/09/2021 0739 Last data filed at 03/08/2021 2354 Gross per 24 hour  Intake 1548.33 ml  Output 1200 ml  Net 348.33 ml   Last 3 Weights 03/09/2021 03/08/2021 03/06/2021  Weight (lbs) 191 lb 9.3 oz 192 lb 3.9 oz 180 lb 8.9 oz  Weight (kg) 86.9 kg 87.2 kg 81.9 kg      Telemetry    Atrial fibrillation with elevated rate- Personally Reviewed  Physical Exam   GEN: No acute distress.   Neck: No JVD Cardiac: irregular and tachycardic Respiratory: Diffuse  rhonchi GI: Soft, nontender, non-distended  MS: No edema Neuro:  Nonfocal  Psych: Normal affect    Chemistry Recent Labs  Lab 03/06/21 1305 03/07/21 0250 03/08/21 0735 03/09/21 0232  NA 136 135 133* 132*  K 3.4* 3.4* 3.4* 4.0  CL 99 103 106 106  CO2 19* 19* 19* 20*  GLUCOSE 135* 120* 121* 112*  BUN 55* 53* 54* 41*  CREATININE 3.41* 2.32* 1.58* 1.17*  CALCIUM 8.2* 7.4* 7.5* 7.8*  PROT 6.3*  --  4.3* 4.3*  ALBUMIN 3.0*  --  2.0* 2.0*  AST 26  --  19 16  ALT 23  --  19 21  ALKPHOS 49  --  38 42  BILITOT 1.2  --  0.5 0.5  GFRNONAA 13* 21* 33* 47*  ANIONGAP 18* 13 8 6      Hematology Recent Labs  Lab 03/07/21 0250 03/08/21 0735 03/09/21 0232  WBC 16.2* 11.1* 8.8  RBC 4.04 3.59* 3.54*  HGB 11.8* 10.5* 10.3*  HCT 35.0* 30.4* 30.2*  MCV 86.6 84.7 85.3  MCH 29.2 29.2 29.1  MCHC 33.7 34.5 34.1  RDW 14.9 15.0 15.2  PLT 165 130* 117*    BNP Recent Labs  Lab 03/08/21 1043  BNP 309.0*    Radiology  DG CHEST PORT 1 VIEW  Result Date: 03/08/2021 CLINICAL DATA:  Pneumonia EXAM: PORTABLE CHEST 1 VIEW COMPARISON:  Two days ago and 06/13/2020 FINDINGS: Low volume chest with streaky density at the bases. Borderline heart size with stable aortic tortuosity. Artifact from EKG leads. No effusion or pneumothorax. Calcification over the right upper quadrant. IMPRESSION: Stable chest when compared to 2021.  No acute finding. Electronically Signed   By: Monte Fantasia M.D.   On: 03/08/2021 09:51   VAS [place] ABI WITH/WO TBI  Result Date: 03/08/2021  LOWER EXTREMITY DOPPLER STUDY Patient Name:  Alisha Terrell  Date of Exam:   03/08/2021 Medical Rec #: [id number]           Accession #:    [id number] Date of Birth: 1941/09/20           Patient Gender: F Patient Age:   79Y Exam Location:  [facility] Procedure:      VAS [place] ABI WITH/WO TBI Referring Phys: [id number] JENNIFER YATES --------------------------------------------------------------------------------  Indications:  Ulceration. High Risk Factors: Hypertension, no history of smoking. Other Factors: TIA, Afib.  Comparison Study: No previous exams Performing Technologist: Hill, Jody RVT, RDMS  Examination Guidelines: A complete evaluation includes at minimum, Doppler waveform signals and systolic blood pressure reading at the level of bilateral brachial, anterior tibial, and posterior tibial arteries, when vessel segments are accessible. Bilateral testing is considered an integral part of a complete examination. Photoelectric Plethysmograph (PPG) waveforms and toe systolic pressure readings are included as required and additional duplex testing as needed. Limited examinations for reoccurring indications may be performed as noted.  ABI Findings: +---------+------------------+-----+---------+--------+ Right    Rt Pressure (mmHg)IndexWaveform Comment  +---------+------------------+-----+---------+--------+ Brachial 113                    triphasic         +---------+------------------+-----+---------+--------+ PTA      108               0.96 triphasic         +---------+------------------+-----+---------+--------+ DP       106               0.94 triphasic         +---------+------------------+-----+---------+--------+ Great Toe101               0.89 Normal            +---------+------------------+-----+---------+--------+ +---------+------------------+-----+----------------+-------+ Left     Lt Pressure (mmHg)IndexWaveform        Comment +---------+------------------+-----+----------------+-------+ Brachial                        triphasic       IV      +---------+------------------+-----+----------------+-------+ PTA      127               1.12 biphasic                +---------+------------------+-----+----------------+-------+ DP       104               0.92 Audibly biphasic        +---------+------------------+-----+----------------+-------+ Great Toe49                0.43  Abnormal                +---------+------------------+-----+----------------+-------+ +-------+-----------+-----------+------------+------------+ ABI/TBIToday's ABIToday's TBIPrevious ABIPrevious TBI +-------+-----------+-----------+------------+------------+ Right  0.96       0.89                                +-------+-----------+-----------+------------+------------+  Left   1.12       0.43                                +-------+-----------+-----------+------------+------------+  Doppler waveforms difficult to obtain due to patient being in constant Afib .  Summary: Right: Resting right ankle-brachial index is within normal range. No evidence of significant right lower extremity arterial disease. The right toe-brachial index is normal. Left: Resting left ankle-brachial index is within normal range. No evidence of significant left lower extremity arterial disease. The left toe-brachial index is abnormal.  *See table(s) above for measurements and observations.     Preliminary    ECHOCARDIOGRAM COMPLETE  Result Date: 03/08/2021    ECHOCARDIOGRAM REPORT   Patient Name:   Alisha Terrell Date of Exam: 03/08/2021 Medical Rec #:  [id number]          Height:       59.0 in Accession #:    [id number]         Weight:       192.2 lb Date of Birth:  October 01, 1941          BSA:          1.814 m Patient Age:    82 years           BP:           94/77 mmHg Patient Gender: F                  HR:           125 bpm. Exam Location:  Inpatient Procedure: 2D Echo, Color Doppler and Cardiac Doppler Indications:    Atrial fibrillation  History:        Patient has prior history of Echocardiogram examinations, most                 recent 08/17/2020. Arrythmias:Atrial Fibrillation; Risk                 Factors:Hypertension.  Sonographer:    [place] Referring Phys: [id number] Margie Billet  Sonographer Comments: Patient is morbidly obese. Image acquisition challenging due to patient body habitus. IMPRESSIONS  1.  Left ventricular ejection fraction, by estimation, is 55 to 60%. The left ventricle has normal function. The left ventricle has no regional wall motion abnormalities. There is mild concentric left ventricular hypertrophy. Left ventricular diastolic function could not be evaluated.  2. Right ventricular systolic function is normal. The right ventricular size is normal. There is mildly elevated pulmonary artery systolic pressure. The estimated right ventricular systolic pressure is 30.8 mmHg.  3. Left atrial size was mild to moderately dilated.  4. The mitral valve is grossly normal. Trivial mitral valve regurgitation. No evidence of mitral stenosis.  5. The aortic valve is tricuspid. Aortic valve regurgitation is not visualized. No aortic stenosis is present.  6. The inferior vena cava is dilated in size with <50% respiratory variability, suggesting right atrial pressure of 15 mmHg. Comparison(s): Changes from prior study are noted. Afib is now present. FINDINGS  Left Ventricle: Left ventricular ejection fraction, by estimation, is 55 to 60%. The left ventricle has normal function. The left ventricle has no regional wall motion abnormalities. The left ventricular internal cavity size was normal in size. There is  mild concentric left ventricular hypertrophy. Left ventricular diastolic function could not be evaluated due to atrial fibrillation. Left ventricular diastolic function could not  be evaluated. Right Ventricle: The right ventricular size is normal. No increase in right ventricular wall thickness. Right ventricular systolic function is normal. There is mildly elevated pulmonary artery systolic pressure. The tricuspid regurgitant velocity is 2.65  m/s, and with an assumed right atrial pressure of 15 mmHg, the estimated right ventricular systolic pressure is 70.9 mmHg. Left Atrium: Left atrial size was mild to moderately dilated. Right Atrium: Right atrial size was normal in size. Pericardium: Trivial  pericardial effusion is present. Mitral Valve: The mitral valve is grossly normal. Trivial mitral valve regurgitation. No evidence of mitral valve stenosis. Tricuspid Valve: The tricuspid valve is grossly normal. Tricuspid valve regurgitation is trivial. No evidence of tricuspid stenosis. Aortic Valve: The aortic valve is tricuspid. Aortic valve regurgitation is not visualized. No aortic stenosis is present. Aortic valve mean gradient measures 6.0 mmHg. Aortic valve peak gradient measures 9.1 mmHg. Aortic valve area, by VTI measures 1.97 cm. Pulmonic Valve: The pulmonic valve was grossly normal. Pulmonic valve regurgitation is not visualized. No evidence of pulmonic stenosis. Aorta: The aortic root and ascending aorta are structurally normal, with no evidence of dilitation. Venous: The inferior vena cava is dilated in size with less than 50% respiratory variability, suggesting right atrial pressure of 15 mmHg. IAS/Shunts: The atrial septum is grossly normal.  LEFT VENTRICLE PLAX 2D LVIDd:         4.70 cm     Diastology LV PW:         1.00 cm     LV e' medial:    6.85 cm/s LV IVS:        1.20 cm     LV E/e' medial:  16.4 LVOT diam:     2.00 cm     LV e' lateral:   13.10 cm/s LV SV:         45          LV E/e' lateral: 8.5 LV SV Index:   25 LVOT Area:     3.14 cm  LV Volumes (MOD) LV vol d, MOD A2C: 35.1 ml LV vol d, MOD A4C: 51.2 ml LV vol s, MOD A2C: 16.7 ml LV vol s, MOD A4C: 27.2 ml LV SV MOD A2C:     18.4 ml LV SV MOD A4C:     51.2 ml LV SV MOD BP:      21.7 ml RIGHT VENTRICLE RV Basal diam:  3.40 cm RV Mid diam:    3.20 cm RV S prime:     15.70 cm/s TAPSE (M-mode): 1.8 cm LEFT ATRIUM             Index       RIGHT ATRIUM           Index LA diam:        4.20 cm 2.32 cm/m  RA Area:     15.90 cm LA Vol (A2C):   88.9 ml 49.02 ml/m RA Volume:   41.50 ml  22.88 ml/m LA Vol (A4C):   71.9 ml 39.64 ml/m LA Biplane Vol: 80.9 ml 44.61 ml/m  AORTIC VALVE                    PULMONIC VALVE AV Area (Vmax):    2.16 cm      PV Vmax:       0.95 m/s AV Area (Vmean):   1.73 cm     PV Vmean:      67.900 cm/s AV Area (VTI):  1.97 cm     PV VTI:        0.151 m AV Vmax:           151.00 cm/s  PV Peak grad:  3.6 mmHg AV Vmean:          122.000 cm/s PV Mean grad:  2.0 mmHg AV VTI:            0.228 m AV Peak Grad:      9.1 mmHg AV Mean Grad:      6.0 mmHg LVOT Vmax:         104.00 cm/s LVOT Vmean:        67.300 cm/s LVOT VTI:          0.143 m LVOT/AV VTI ratio: 0.63  AORTA Ao Root diam: 3.10 cm Ao Asc diam:  3.40 cm MV E velocity: 112.00 cm/s  TRICUSPID VALVE                             TR Peak grad:   28.1 mmHg                             TR Vmax:        265.00 cm/s                              SHUNTS                             Systemic VTI:  0.14 m                             Systemic Diam: 2.00 cm Eleonore Chiquito MD Electronically signed by Eleonore Chiquito MD Signature Date/Time: 03/08/2021/4:51:33 PM    Final     Patient Profile     80 yo female with history of HTN, asthma, TIA, OSA on CPAP,obesity, chronic back pain, who presented to the ER with c/o weakness, nausea, vomiting, heart palpitation, and nothing to eat and drink for 3 days, found to have sepsis with lactic acidosis >2 2/2 presumed toe blister with infection, AKI, and A. fib with RVR. Cardiology is consulted and  following for afib.  Echocardiogram shows normal LV function, mild left ventricular hypertrophy, mild to moderate left atrial enlargement.  Assessment & Plan    1 newly diagnosed atrial fibrillation-patient remains in atrial fibrillation this morning.  Her heart rate remains elevated.  Blood pressure is borderline.  Continue IV amiodarone and I will increase metoprolol to 25 mg twice daily.  Continue IV heparin.  Will transition to apixaban when it is clear no procedures are required.  If rate continues to be difficult to control will need to consider TEE guided cardioversion next week.  2 sepsis-felt secondary to toe abscess/cellulitis.  Continue  antibiotics.  Management per primary care.  3 acute renal insufficiency-BUN and creatinine are improving with therapy.  Continue to follow.  4 hypertension-Home medications including amlodipine, atenolol, Lasix and losartan are being held at present given low blood pressure.  We will continue to follow.  5 elevated T4-TSH is normal.  Further evaluation per hospitalist service.  For questions or updates, please contact [facility] Please consult [email or web address] for contact info under        Signed, Aaron Edelman  Stanford Breed, MD  03/09/2021, 7:39 AM

## 2021-03-09 NOTE — Progress Notes (Signed)
ANTICOAGULATION CONSULT NOTE - Follow Up Consult  Pharmacy Consult for Heparin Indication: atrial fibrillation  Allergies  Allergen Reactions  . Codeine Rash and Other (See Comments)    Agitation, bad dreams  . Omnicef [Cefdinir] Nausea And Vomiting    Confirmed with patient at bedside that no rash - just had significant GI effects    Patient Measurements: Height: 4\' 11"  (149.9 cm) Weight: 86.9 kg (191 lb 9.3 oz) IBW/kg (Calculated) : 43.2 Heparin Dosing Weight: 62  Vital Signs: Temp: 97.8 F (36.6 C) (05/28 0410) Temp Source: Oral (05/28 0410) BP: 107/64 (05/28 0410) Pulse Rate: 84 (05/28 0410)  Labs: Recent Labs    03/06/21 1201 03/06/21 1305 03/07/21 0250 03/07/21 1229 03/08/21 0735 03/08/21 1758 03/09/21 0232  HGB  --    < > 11.8*  --  10.5*  --  10.3*  HCT  --    < > 35.0*  --  30.4*  --  30.2*  PLT  --    < > 165  --  130*  --  117*  APTT 21*  --   --   --   --   --   --   LABPROT 15.6*  --   --   --   --   --   --   INR 1.2  --   --   --   --   --   --   HEPARINUNFRC  --   --  <0.10*   < > 0.19* 0.28* 0.32  CREATININE  --    < > 2.32*  --  1.58*  --  1.17*   < > = values in this interval not displayed.    Estimated Creatinine Clearance: 36.7 mL/min (A) (by C-G formula based on SCr of 1.17 mg/dL (H)).   Medications:  Medications Prior to Admission  Medication Sig Dispense Refill Last Dose  . ADVAIR DISKUS 250-50 MCG/DOSE AEPB Inhale 1 puff into the lungs 2 (two) times daily.    Past Week at Unknown time  . CALCIUM-VITAMIN D PO Take 1 tablet by mouth daily.   Past Week at Unknown time  . Dextromethorphan-Guaifenesin (ROBITUSSIN COUGH/CHEST DM MAX PO) Take 1 mL by mouth 2 (two) times daily as needed (for cold symptoms).    Past Week at Unknown time  . diclofenac Sodium (VOLTAREN) 1 % GEL Apply 2 g topically at bedtime.   Past Week at Unknown time  . fluticasone (FLONASE) 50 MCG/ACT nasal spray Place 1 spray into both nostrils at bedtime.    Past Week at  Unknown time  . furosemide (LASIX) 20 MG tablet Take 20 mg by mouth daily as needed for fluid.   03/03/2021 at unknown time  . losartan (COZAAR) 100 MG tablet Take 100 mg by mouth daily.   03/03/2021 at Unknown time  . montelukast (SINGULAIR) 10 MG tablet Take 10 mg by mouth at bedtime.   Past Week at Unknown time  . Multiple Vitamins-Minerals (CENTRUM SILVER ADULT 50+ PO) Take 1 tablet by mouth daily.   Past Week at Unknown time  . omeprazole (PRILOSEC) 40 MG capsule Take 40 mg by mouth in the morning and at bedtime.    Past Week at Unknown time  . PROAIR HFA 108 (90 Base) MCG/ACT inhaler Inhale 1-2 puffs into the lungs every 6 (six) hours as needed for wheezing or shortness of breath.    Past Week at Unknown time  . traMADol (ULTRAM) 50 MG tablet Take 50 mg by  mouth every 6 (six) hours as needed for moderate pain or severe pain.   Past Month at Unknown time  . amLODipine (NORVASC) 5 MG tablet Take 5 mg by mouth daily.   03/04/2021 at unknown time  . aspirin EC 81 MG tablet Take 81 mg by mouth daily.   03/04/2021 at unknown  . atenolol (TENORMIN) 50 MG tablet Take 50 mg by mouth 2 (two) times daily.   03/04/2021 at unknown  . ibandronate (BONIVA) 150 MG tablet Take 150 mg by mouth every 30 (thirty) days.    over 2 months at unknown time   Scheduled:  . diclofenac Sodium  2 g Topical QID  . feeding supplement (GLUCERNA SHAKE)  237 mL Oral BID BM  . metoprolol tartrate  12.5 mg Oral BID  . mometasone-formoterol  2 puff Inhalation BID  . montelukast  10 mg Oral QHS  . multivitamin with minerals  1 tablet Oral Daily  . pantoprazole  40 mg Oral BID  . Ensure Max Protein  11 oz Oral QHS  . sodium chloride flush  3 mL Intravenous Q12H  . vancomycin variable dose per unstable renal function (pharmacist dosing)   Does not apply See admin instructions   Assessment: 89 YOF who presented with Afib, was started on IV heparin. No AC PTA.    Heparin level now therapeutic at 0.31 after rate increase to 1800  units/hr. No s/sx of bleeding or infusion issues. Hgb 10.3, PLTs 117.  Goal of Therapy:  Heparin level 0.3-0.7 units/ml Monitor platelets by anticoagulation protocol: Yes   Plan:  Heparin 1800 units/hour Monitor daily CBC and heparin levels  Romilda Garret, PharmD PGY1 Acute Care Pharmacy Resident 03/09/2021 7:28 AM  Please check [email or web address] for unit specific pharmacy phone numbers.

## 2021-03-10 DIAGNOSIS — I4891 Unspecified atrial fibrillation: Secondary | ICD-10-CM | POA: Diagnosis not present

## 2021-03-10 DIAGNOSIS — L97521 Non-pressure chronic ulcer of other part of left foot limited to breakdown of skin: Secondary | ICD-10-CM

## 2021-03-10 LAB — CBC
HCT: 30.7 % — ABNORMAL LOW (ref 36.0–46.0)
Hemoglobin: 10.5 g/dL — ABNORMAL LOW (ref 12.0–15.0)
MCH: 29.3 pg (ref 26.0–34.0)
MCHC: 34.2 g/dL (ref 30.0–36.0)
MCV: 85.8 fL (ref 80.0–100.0)
Platelets: 153 10*3/uL (ref 150–400)
RBC: 3.58 MIL/uL — ABNORMAL LOW (ref 3.87–5.11)
RDW: 15.4 % (ref 11.5–15.5)
WBC: 6.2 10*3/uL (ref 4.0–10.5)
nRBC: 0 % (ref 0.0–0.2)

## 2021-03-10 LAB — COMPREHENSIVE METABOLIC PANEL
ALT: 25 U/L (ref 0–44)
AST: 17 U/L (ref 15–41)
Albumin: 2.2 g/dL — ABNORMAL LOW (ref 3.5–5.0)
Alkaline Phosphatase: 40 U/L (ref 38–126)
Anion gap: 6 (ref 5–15)
BUN: 24 mg/dL — ABNORMAL HIGH (ref 8–23)
CO2: 23 mmol/L (ref 22–32)
Calcium: 8.1 mg/dL — ABNORMAL LOW (ref 8.9–10.3)
Chloride: 106 mmol/L (ref 98–111)
Creatinine, Ser: 0.87 mg/dL (ref 0.44–1.00)
GFR, Estimated: >60 mL/min (ref >60)
Glucose, Bld: 108 mg/dL — ABNORMAL HIGH (ref 70–99)
Potassium: 4 mmol/L (ref 3.5–5.1)
Sodium: 135 mmol/L (ref 135–145)
Total Bilirubin: 0.5 mg/dL (ref 0.3–1.2)
Total Protein: 4.6 g/dL — ABNORMAL LOW (ref 6.5–8.1)

## 2021-03-10 LAB — AEROBIC CULTURE W GRAM STAIN (SUPERFICIAL SPECIMEN): Gram Stain: NONE SEEN

## 2021-03-10 LAB — MAGNESIUM: Magnesium: 1.9 mg/dL (ref 1.7–2.4)

## 2021-03-10 LAB — HEPARIN LEVEL (UNFRACTIONATED): Heparin Unfractionated: 0.51 IU/mL (ref 0.30–0.70)

## 2021-03-10 MED ORDER — AEROCHAMBER PLUS FLO-VU MEDIUM MISC
1.0000 | Freq: Once | Status: AC
  Administered 2021-03-11: 1
  Filled 2021-03-10: qty 1

## 2021-03-10 NOTE — Progress Notes (Signed)
Subjective: 80 year old female admitted to the hospital with sepsis, infection of her left foot found to be in A. fib.  States that she is feeling better.  Denies any fevers or chills.  No current nausea or vomiting.  Objective: AAO x3, NAD-sitting in bed with feet off the bed and family at bedside DP/PT pulses palpable bilaterally, CRT less than 3 seconds Superficial area skin breakdown dorsal aspect the second toe with mild bleeding present.  There is no purulence identified today.  The erythema that was present is much improved today.  There is no significant warmth of the foot today.  There is no fluctuation or crepitation but there is no malodor. No pain with calf compression, erythema or warmth.   Assessment: Superficial abscess left foot, cellulitis; sepsis  Plan: X-rays were negative.  White blood cell count currently 6.2 and afebrile.  For now continue antibiotics.  Wound cultures growing abundant staph aureus.  Awaiting sensitivities.  As the infection is seen.  No acute surgical intervention.  Infection has improved.  I discussed with her we still need to watch the toe is that she is at risk of amputation of the toe however hopefully this will continue to improve.  Continue local wound care for now.  Podiatry will continue to follow.  Alisha Terrell, DPM

## 2021-03-10 NOTE — Progress Notes (Signed)
ANTICOAGULATION CONSULT NOTE - Follow Up Consult  Pharmacy Consult for Heparin Indication: atrial fibrillation  Allergies  Allergen Reactions  . Codeine Rash and Other (See Comments)    Agitation, bad dreams  . Omnicef [Cefdinir] Nausea And Vomiting    Confirmed with patient at bedside that no rash - just had significant GI effects    Patient Measurements: Height: 4\' 11"  (149.9 cm) Weight: 86.7 kg (191 lb 2.2 oz) IBW/kg (Calculated) : 43.2 Heparin Dosing Weight: 62  Vital Signs: Temp: 97.8 F (36.6 C) (05/29 0355) Temp Source: Oral (05/29 0355) BP: 118/79 (05/29 0355) Pulse Rate: 113 (05/29 0355)  Labs: Recent Labs    03/08/21 0735 03/08/21 1758 03/09/21 0232 03/09/21 0931 03/10/21 0552  HGB 10.5*  --  10.3*  --  10.5*  HCT 30.4*  --  30.2*  --  30.7*  PLT 130*  --  117*  --  153  APTT  --   --   --  20*  --   HEPARINUNFRC 0.19* 0.28* 0.32  --  0.51  CREATININE 1.58*  --  1.17*  --  0.87    Estimated Creatinine Clearance: 49.3 mL/min (by C-G formula based on SCr of 0.87 mg/dL).  Assessment: 75 YOF who presented with Afib, was started on IV heparin for potential cardioversion and surgical interventions for cellulitis. No AC PTA.    Heparin level continues to be therapeutic at 0.51 on 1800 units/hr. No s/sx of bleeding or infusion issues. Hgb 10.5 (stable), PLTs 153 (improved).   Goal of Therapy:  Heparin level 0.3-0.7 units/ml Monitor platelets by anticoagulation protocol: Yes   Plan:  Heparin 1800 units/hour Monitor daily CBC and heparin levels Follow up transition to oral [facility]  Romilda Garret, PharmD PGY1 Acute Care Pharmacy Resident 03/10/2021 7:33 AM  Please check [email or web address] for unit specific pharmacy phone numbers.

## 2021-03-10 NOTE — Progress Notes (Signed)
Progress Note  Patient Name: Alisha Terrell Date of Encounter: 03/10/2021  [facility] Cardiologist: Elouise Munroe, MD   Subjective   Denies CP; mild dyspnea  Inpatient Medications    Scheduled Meds: . dextromethorphan  15 mg Oral BID  . diclofenac Sodium  2 g Topical QID  . doxycycline  100 mg Oral Q12H  . feeding supplement (GLUCERNA SHAKE)  237 mL Oral BID BM  . metoprolol tartrate  25 mg Oral BID  . mometasone-formoterol  2 puff Inhalation BID  . montelukast  10 mg Oral QHS  . multivitamin with minerals  1 tablet Oral Daily  . pantoprazole  40 mg Oral BID  . predniSONE  40 mg Oral QAC breakfast  . Ensure Max Protein  11 oz Oral QHS  . sodium chloride flush  3 mL Intravenous Q12H   Continuous Infusions: . amiodarone 30 mg/hr (03/09/21 2300)  . heparin 1,800 Units/hr (03/10/21 0018)  . lactated ringers    . lactated ringers Stopped (03/08/21 1112)   PRN Meds: acetaminophen **OR** acetaminophen, albuterol, levalbuterol, ondansetron **OR** ondansetron (ZOFRAN) IV, traMADol   Vital Signs    Vitals:   03/09/21 2300 03/10/21 0355 03/10/21 0745 03/10/21 0810  BP: 106/81 118/79 (!) 147/95   Pulse: (!) 116 (!) 113 (!) 126   Resp: (!) 22 18 10    Temp: 98 F (36.7 C) 97.8 F (36.6 C) 97.7 F (36.5 C)   TempSrc: Oral Oral Oral   SpO2: 99% 96% 97% 96%  Weight:  86.7 kg    Height:        Intake/Output Summary (Last 24 hours) at 03/10/2021 0916 Last data filed at 03/10/2021 0748 Gross per 24 hour  Intake 1079.4 ml  Output 1200 ml  Net -120.6 ml   Last 3 Weights 03/10/2021 03/09/2021 03/08/2021  Weight (lbs) 191 lb 2.2 oz 191 lb 9.3 oz 192 lb 3.9 oz  Weight (kg) 86.7 kg 86.9 kg 87.2 kg      Telemetry    Atrial fibrillation with elevated rate- Personally Reviewed  Physical Exam   GEN: WD NAD  Neck: supple Cardiac: irregular and tachycardic, no gallop Respiratory: CTA GI: Soft, NT/ND MS: No edema Neuro:  Grossly intact Psych: Normal affect     Chemistry Recent Labs  Lab 03/08/21 0735 03/09/21 0232 03/10/21 0552  NA 133* 132* 135  K 3.4* 4.0 4.0  CL 106 106 106  CO2 19* 20* 23  GLUCOSE 121* 112* 108*  BUN 54* 41* 24*  CREATININE 1.58* 1.17* 0.87  CALCIUM 7.5* 7.8* 8.1*  PROT 4.3* 4.3* 4.6*  ALBUMIN 2.0* 2.0* 2.2*  AST 19 16 17   ALT 19 21 25   ALKPHOS 38 42 40  BILITOT 0.5 0.5 0.5  GFRNONAA 33* 47* >60  ANIONGAP 8 6 6      Hematology Recent Labs  Lab 03/08/21 0735 03/09/21 0232 03/10/21 0552  WBC 11.1* 8.8 6.2  RBC 3.59* 3.54* 3.58*  HGB 10.5* 10.3* 10.5*  HCT 30.4* 30.2* 30.7*  MCV 84.7 85.3 85.8  MCH 29.2 29.1 29.3  MCHC 34.5 34.1 34.2  RDW 15.0 15.2 15.4  PLT 130* 117* 153    BNP Recent Labs  Lab 03/08/21 1043  BNP 309.0*    Radiology    DG CHEST PORT 1 VIEW  Result Date: 03/08/2021 CLINICAL DATA:  Pneumonia EXAM: PORTABLE CHEST 1 VIEW COMPARISON:  Two days ago and 06/13/2020 FINDINGS: Low volume chest with streaky density at the bases. Borderline heart size with stable  aortic tortuosity. Artifact from EKG leads. No effusion or pneumothorax. Calcification over the right upper quadrant. IMPRESSION: Stable chest when compared to 2021.  No acute finding. Electronically Signed   By: Monte Fantasia M.D.   On: 03/08/2021 09:51   VAS [place] ABI WITH/WO TBI  Result Date: 03/09/2021  LOWER EXTREMITY DOPPLER STUDY Patient Name:  Alisha Terrell  Date of Exam:   03/08/2021 Medical Rec #: [id number]           Accession #:    [id number] Date of Birth: [birth year]           Patient Gender: F Patient Age:   79Y Exam Location:  [facility] Procedure:      VAS [place] ABI WITH/WO TBI Referring Phys: [id number] JENNIFER YATES --------------------------------------------------------------------------------  Indications: Ulceration. High Risk Factors: Hypertension, no history of smoking. Other Factors: TIA, Afib.  Comparison Study: No previous exams Performing Technologist: Hill, Jody RVT, RDMS  Examination Guidelines: A  complete evaluation includes at minimum, Doppler waveform signals and systolic blood pressure reading at the level of bilateral brachial, anterior tibial, and posterior tibial arteries, when vessel segments are accessible. Bilateral testing is considered an integral part of a complete examination. Photoelectric Plethysmograph (PPG) waveforms and toe systolic pressure readings are included as required and additional duplex testing as needed. Limited examinations for reoccurring indications may be performed as noted.  ABI Findings: +---------+------------------+-----+---------+--------+ Right    Rt Pressure (mmHg)IndexWaveform Comment  +---------+------------------+-----+---------+--------+ Brachial 113                    triphasic         +---------+------------------+-----+---------+--------+ PTA      108               0.96 triphasic         +---------+------------------+-----+---------+--------+ DP       106               0.94 triphasic         +---------+------------------+-----+---------+--------+ Great Toe101               0.89 Normal            +---------+------------------+-----+---------+--------+ +---------+------------------+-----+----------------+-------+ Left     Lt Pressure (mmHg)IndexWaveform        Comment +---------+------------------+-----+----------------+-------+ Brachial                        triphasic       IV      +---------+------------------+-----+----------------+-------+ PTA      127               1.12 biphasic                +---------+------------------+-----+----------------+-------+ DP       104               0.92 Audibly biphasic        +---------+------------------+-----+----------------+-------+ Great Toe49                0.43 Abnormal                +---------+------------------+-----+----------------+-------+ +-------+-----------+-----------+------------+------------+ ABI/TBIToday's ABIToday's TBIPrevious ABIPrevious  TBI +-------+-----------+-----------+------------+------------+ Right  0.96       0.89                                +-------+-----------+-----------+------------+------------+ Left   1.12  0.43                                +-------+-----------+-----------+------------+------------+  Doppler waveforms difficult to obtain due to patient being in constant Afib .  Summary: Right: Resting right ankle-brachial index is within normal range. No evidence of significant right lower extremity arterial disease. The right toe-brachial index is normal. Left: Resting left ankle-brachial index is within normal range. No evidence of significant left lower extremity arterial disease. The left toe-brachial index is abnormal.  *See table(s) above for measurements and observations.  Electronically signed by Monica Martinez MD on 03/09/2021 at 11:00:02 AM.    Final    ECHOCARDIOGRAM COMPLETE  Result Date: 03/08/2021    ECHOCARDIOGRAM REPORT   Patient Name:   Alisha Terrell Date of Exam: 03/08/2021 Medical Rec #:  [id number]          Height:       59.0 in Accession #:    [id number]         Weight:       192.2 lb Date of Birth:  1941/05/20          BSA:          1.814 m Patient Age:    39 years           BP:           94/77 mmHg Patient Gender: F                  HR:           125 bpm. Exam Location:  Inpatient Procedure: 2D Echo, Color Doppler and Cardiac Doppler Indications:    Atrial fibrillation  History:        Patient has prior history of Echocardiogram examinations, most                 recent 08/17/2020. Arrythmias:Atrial Fibrillation; Risk                 Factors:Hypertension.  Sonographer:    [place] Referring Phys: [id number] Margie Billet  Sonographer Comments: Patient is morbidly obese. Image acquisition challenging due to patient body habitus. IMPRESSIONS  1. Left ventricular ejection fraction, by estimation, is 55 to 60%. The left ventricle has normal function. The left ventricle has no  regional wall motion abnormalities. There is mild concentric left ventricular hypertrophy. Left ventricular diastolic function could not be evaluated.  2. Right ventricular systolic function is normal. The right ventricular size is normal. There is mildly elevated pulmonary artery systolic pressure. The estimated right ventricular systolic pressure is 18.5 mmHg.  3. Left atrial size was mild to moderately dilated.  4. The mitral valve is grossly normal. Trivial mitral valve regurgitation. No evidence of mitral stenosis.  5. The aortic valve is tricuspid. Aortic valve regurgitation is not visualized. No aortic stenosis is present.  6. The inferior vena cava is dilated in size with <50% respiratory variability, suggesting right atrial pressure of 15 mmHg. Comparison(s): Changes from prior study are noted. Afib is now present. FINDINGS  Left Ventricle: Left ventricular ejection fraction, by estimation, is 55 to 60%. The left ventricle has normal function. The left ventricle has no regional wall motion abnormalities. The left ventricular internal cavity size was normal in size. There is  mild concentric left ventricular hypertrophy. Left ventricular diastolic function could not be evaluated due to atrial fibrillation. Left ventricular diastolic function could  not be evaluated. Right Ventricle: The right ventricular size is normal. No increase in right ventricular wall thickness. Right ventricular systolic function is normal. There is mildly elevated pulmonary artery systolic pressure. The tricuspid regurgitant velocity is 2.65  m/s, and with an assumed right atrial pressure of 15 mmHg, the estimated right ventricular systolic pressure is 82.9 mmHg. Left Atrium: Left atrial size was mild to moderately dilated. Right Atrium: Right atrial size was normal in size. Pericardium: Trivial pericardial effusion is present. Mitral Valve: The mitral valve is grossly normal. Trivial mitral valve regurgitation. No evidence of mitral  valve stenosis. Tricuspid Valve: The tricuspid valve is grossly normal. Tricuspid valve regurgitation is trivial. No evidence of tricuspid stenosis. Aortic Valve: The aortic valve is tricuspid. Aortic valve regurgitation is not visualized. No aortic stenosis is present. Aortic valve mean gradient measures 6.0 mmHg. Aortic valve peak gradient measures 9.1 mmHg. Aortic valve area, by VTI measures 1.97 cm. Pulmonic Valve: The pulmonic valve was grossly normal. Pulmonic valve regurgitation is not visualized. No evidence of pulmonic stenosis. Aorta: The aortic root and ascending aorta are structurally normal, with no evidence of dilitation. Venous: The inferior vena cava is dilated in size with less than 50% respiratory variability, suggesting right atrial pressure of 15 mmHg. IAS/Shunts: The atrial septum is grossly normal.  LEFT VENTRICLE PLAX 2D LVIDd:         4.70 cm     Diastology LV PW:         1.00 cm     LV e' medial:    6.85 cm/s LV IVS:        1.20 cm     LV E/e' medial:  16.4 LVOT diam:     2.00 cm     LV e' lateral:   13.10 cm/s LV SV:         45          LV E/e' lateral: 8.5 LV SV Index:   25 LVOT Area:     3.14 cm  LV Volumes (MOD) LV vol d, MOD A2C: 35.1 ml LV vol d, MOD A4C: 51.2 ml LV vol s, MOD A2C: 16.7 ml LV vol s, MOD A4C: 27.2 ml LV SV MOD A2C:     18.4 ml LV SV MOD A4C:     51.2 ml LV SV MOD BP:      21.7 ml RIGHT VENTRICLE RV Basal diam:  3.40 cm RV Mid diam:    3.20 cm RV S prime:     15.70 cm/s TAPSE (M-mode): 1.8 cm LEFT ATRIUM             Index       RIGHT ATRIUM           Index LA diam:        4.20 cm 2.32 cm/m  RA Area:     15.90 cm LA Vol (A2C):   88.9 ml 49.02 ml/m RA Volume:   41.50 ml  22.88 ml/m LA Vol (A4C):   71.9 ml 39.64 ml/m LA Biplane Vol: 80.9 ml 44.61 ml/m  AORTIC VALVE                    PULMONIC VALVE AV Area (Vmax):    2.16 cm     PV Vmax:       0.95 m/s AV Area (Vmean):   1.73 cm     PV Vmean:      67.900 cm/s AV Area (VTI):  1.97 cm     PV VTI:        0.151 m  AV Vmax:           151.00 cm/s  PV Peak grad:  3.6 mmHg AV Vmean:          122.000 cm/s PV Mean grad:  2.0 mmHg AV VTI:            0.228 m AV Peak Grad:      9.1 mmHg AV Mean Grad:      6.0 mmHg LVOT Vmax:         104.00 cm/s LVOT Vmean:        67.300 cm/s LVOT VTI:          0.143 m LVOT/AV VTI ratio: 0.63  AORTA Ao Root diam: 3.10 cm Ao Asc diam:  3.40 cm MV E velocity: 112.00 cm/s  TRICUSPID VALVE                             TR Peak grad:   28.1 mmHg                             TR Vmax:        265.00 cm/s                              SHUNTS                             Systemic VTI:  0.14 m                             Systemic Diam: 2.00 cm Eleonore Chiquito MD Electronically signed by Eleonore Chiquito MD Signature Date/Time: 03/08/2021/4:51:33 PM    Final     Patient Profile     80 yo female with history of HTN, asthma, TIA, OSA on CPAP,obesity, chronic back pain, who presented to the ER with c/o weakness, nausea, vomiting, heart palpitation, and nothing to eat and drink for 3 days, found to have sepsis with lactic acidosis >2 2/2 presumed toe blister with infection, AKI, and A. fib with RVR. Cardiology is consulted and  following for afib.  Echocardiogram shows normal LV function, mild left ventricular hypertrophy, mild to moderate left atrial enlargement.  Assessment & Plan    1 newly diagnosed atrial fibrillation-patient remains in atrial fibrillation with elevated rate.  We will continue IV amiodarone for now.  Increase metoprolol to 50 mg twice daily.  We will continue IV heparin and transition to apixaban when it is clear no further procedures are necessary.  If rate continues to be difficult to control we will need to arrange TEE guided cardioversion in the coming week.    2 sepsis-improving.  Felt to be secondary to toe abscess/cellulitis.  Continue antibiotics.  3 acute renal insufficiency-renal function continues to improve.  4 hypertension-Home medications including amlodipine, atenolol, Lasix  and losartan are being held at present given low blood pressure.  We will continue to follow.  5 elevated T4-TSH is normal.  Further evaluation per hospitalist service.  For questions or updates, please contact [facility] Please consult [email or web address] for contact info under        Signed, Kirk Ruths, MD  03/10/2021, 9:16 AM

## 2021-03-10 NOTE — Progress Notes (Signed)
PROGRESS NOTE   Alisha Terrell  WVP:[id number] DOB: 1941/01/14 DOA: 03/06/2021 PCP: Christain [place], MD  Brief Narrative:  80 year old community dwelling white female Known history of migraines followed by Dr. Tomi Likens of neurology-TIA [phone] 10/12/2015 Obstructive sleep apnea followed right coronary stone pulmonology [place] (AutoPap 5-10) Asthma COVID recovered 07/02/2020 Dry weight about 184 pounds Follows locally with cardiology-event monitor shows 20 to 30% bradycardia burden-no A. fib was noted  Presented to [facility] with several days of malaise weakness fatigue anorexia in the setting of having swelling of the toe about a week prior to coming to the ED ED with heart rates in the 200s blood pressure 92 O2 sat 88% up to 98% on 3 L  Lactic acid 2.2, BUN/creatinine up from baseline 255/3.4 Anion gap 18 WBC 17  Patient given 2.5 L crystalloid-amiodarone started-empiric vancomycin ceftriaxone started Found to have left second toe with bullae Critical care evaluated felt severe sepsis without shock Podiatrist consulted-unroofed the blister obtain cultures and got x-rays  A. fib has proven difficult to control-cardiology on board-patient still remains on amnio gtt. and there are plans?  For cardioversion  Hospital-Problem based course  Severe sepsis secondary to left foot cellulitis/bullae IV-->doxycycline 2/2 staph-susceptibilities pending Per podiatry Pain control tramadol 50 every 6 as needed New onset A. fib RVR CHADS2 score >4/heparin currently note Cardizem DC 2/2 hypotension-HR still uncontrolled meds: Metoprolol 25 twice daily, amiodarone at 30 mg/H ?  Cardioversion per cardiology-keep heparin at this time Platelets are improving unlikely thyroid diathesis Recheck TSH outpatient setting-T4 spuriously given hospitalization and alteration of HPA axis HTN Holding losartan 100, Lasix 20, amlodipine OSA/CPAP AutoPap settings 5-10 Ordered at bedtime mandatory  CPAP Symptomatic management Complicated migraines/prior TIA followed by Dr. Tomi Likens neurology Not currently on any meds Continue aspirin 81 GDMT Obstructive asthma followed by [facility] CXR repeat 5/27 no active disease Cough improved 5/29 with start of prednisone still some wheeze Continue Singulair 10, ProAir/albuterol nebs, Flonase Use Xopenex more so than albuterol given heart rate Resume dextromethorphan  DVT prophylaxis: Heparin Code Status: Full Family Communication: Discussed with patient's son and daughter-in-law at the Disposition:  Status is: Inpatient  Remains inpatient appropriate because:Hemodynamically unstable, Unsafe d/c plan and IV treatments appropriate due to intensity of illness or inability to take PO   Dispo: The patient is from: Home              Anticipated d/c is to: Unclear at this time              Patient currently is not medically stable to d/c.   Difficult to place patient No       Consultants:   Cardiology  Critical care  Procedures: Multiple  Antimicrobials: As above   Subjective:  Cough improved no chest pain less swollen Heart rate still high-at times has some discomfort but thinks it is from her heart rate No fever no chills Podiatry seen her this Alisha   Objective: Vitals:   03/09/21 2300 03/10/21 0355 03/10/21 0745 03/10/21 0810  BP: 106/81 118/79 (!) 147/95   Pulse: (!) 116 (!) 113 (!) 126   Resp: (!) 22 18 10    Temp: 98 F (36.7 C) 97.8 F (36.6 C) 97.7 F (36.5 C)   TempSrc: Oral Oral Oral   SpO2: 99% 96% 97% 96%  Weight:  86.7 kg    Height:        Intake/Output Summary (Last 24 hours) at 03/10/2021 1121 Last data filed at  03/10/2021 0914 Gross per 24 hour  Intake 1199.4 ml  Output 1400 ml  Net -200.6 ml   Filed Weights   03/08/21 0326 03/09/21 0410 03/10/21 0355  Weight: 87.2 kg 86.9 kg 86.7 kg    Examination:  Pleasant coherent Mild wheeze posterolaterally S1-S2 tachycardic A. fib  on monitors 11/02/1929 range Abdomen obese nontender Left leg not examined Trace edema  Data Reviewed: personally reviewed   Potassium 3.4--> 4.0 BUNs/creatinine 55/3.4-->53/2.3-->54/1.5--> 41/1.1-->24/0.8 CO2 19--> 23 White count 17.7-->16.2-->11.1--> 6.2 Hemoglobin 15.0-->11.8-->10.5--> 10.5 Platelet 185-->165-->130--> 117--> 153 Foot x-rays   [-]  Radiology Studies: VAS [place] ABI WITH/WO TBI  Result Date: 03/09/2021  LOWER EXTREMITY DOPPLER STUDY Patient Name:  Alisha Terrell  Date of Exam:   03/08/2021 Medical Rec #: [id number]           Accession #:    [id number] Date of Birth: 1941/04/26           Patient Gender: F Patient Age:   79Y Exam Location:  [facility] Procedure:      VAS [place] ABI WITH/WO TBI Referring Phys: [id number] JENNIFER YATES --------------------------------------------------------------------------------  Indications: Ulceration. High Risk Factors: Hypertension, no history of smoking. Other Factors: TIA, Afib.  Comparison Study: No previous exams Performing Technologist: Hill, Jody RVT, RDMS  Examination Guidelines: A complete evaluation includes at minimum, Doppler waveform signals and systolic blood pressure reading at the level of bilateral brachial, anterior tibial, and posterior tibial arteries, when vessel segments are accessible. Bilateral testing is considered an integral part of a complete examination. Photoelectric Plethysmograph (PPG) waveforms and toe systolic pressure readings are included as required and additional duplex testing as needed. Limited examinations for reoccurring indications may be performed as noted.  ABI Findings: +---------+------------------+-----+---------+--------+ Right    Rt Pressure (mmHg)IndexWaveform Comment  +---------+------------------+-----+---------+--------+ Brachial 113                    triphasic         +---------+------------------+-----+---------+--------+ PTA      108               0.96 triphasic          +---------+------------------+-----+---------+--------+ DP       106               0.94 triphasic         +---------+------------------+-----+---------+--------+ Great Toe101               0.89 Normal            +---------+------------------+-----+---------+--------+ +---------+------------------+-----+----------------+-------+ Left     Lt Pressure (mmHg)IndexWaveform        Comment +---------+------------------+-----+----------------+-------+ Brachial                        triphasic       IV      +---------+------------------+-----+----------------+-------+ PTA      127               1.12 biphasic                +---------+------------------+-----+----------------+-------+ DP       104               0.92 Audibly biphasic        +---------+------------------+-----+----------------+-------+ Great Toe49                0.43 Abnormal                +---------+------------------+-----+----------------+-------+ +-------+-----------+-----------+------------+------------+ ABI/TBIToday's  ABIToday's TBIPrevious ABIPrevious TBI +-------+-----------+-----------+------------+------------+ Right  0.96       0.89                                +-------+-----------+-----------+------------+------------+ Left   1.12       0.43                                +-------+-----------+-----------+------------+------------+  Doppler waveforms difficult to obtain due to patient being in constant Afib .  Summary: Right: Resting right ankle-brachial index is within normal range. No evidence of significant right lower extremity arterial disease. The right toe-brachial index is normal. Left: Resting left ankle-brachial index is within normal range. No evidence of significant left lower extremity arterial disease. The left toe-brachial index is abnormal.  *See table(s) above for measurements and observations.  Electronically signed by Monica Martinez MD on 03/09/2021 at 11:00:02 AM.     Final    ECHOCARDIOGRAM COMPLETE  Result Date: 03/08/2021    ECHOCARDIOGRAM REPORT   Patient Name:   Alisha Terrell Date of Exam: 03/08/2021 Medical Rec #:  [id number]          Height:       59.0 in Accession #:    [id number]         Weight:       192.2 lb Date of Birth:  02-01-1941          BSA:          1.814 m Patient Age:    68 years           BP:           94/77 mmHg Patient Gender: F                  HR:           125 bpm. Exam Location:  Inpatient Procedure: 2D Echo, Color Doppler and Cardiac Doppler Indications:    Atrial fibrillation  History:        Patient has prior history of Echocardiogram examinations, most                 recent 08/17/2020. Arrythmias:Atrial Fibrillation; Risk                 Factors:Hypertension.  Sonographer:    [place] Referring Phys: [id number] Margie Billet  Sonographer Comments: Patient is morbidly obese. Image acquisition challenging due to patient body habitus. IMPRESSIONS  1. Left ventricular ejection fraction, by estimation, is 55 to 60%. The left ventricle has normal function. The left ventricle has no regional wall motion abnormalities. There is mild concentric left ventricular hypertrophy. Left ventricular diastolic function could not be evaluated.  2. Right ventricular systolic function is normal. The right ventricular size is normal. There is mildly elevated pulmonary artery systolic pressure. The estimated right ventricular systolic pressure is 00.1 mmHg.  3. Left atrial size was mild to moderately dilated.  4. The mitral valve is grossly normal. Trivial mitral valve regurgitation. No evidence of mitral stenosis.  5. The aortic valve is tricuspid. Aortic valve regurgitation is not visualized. No aortic stenosis is present.  6. The inferior vena cava is dilated in size with <50% respiratory variability, suggesting right atrial pressure of 15 mmHg. Comparison(s): Changes from prior study are noted. Afib is now present. FINDINGS  Left Ventricle: Left ventricular  ejection fraction, by estimation, is 55 to 60%. The left ventricle has normal function. The left ventricle has no regional wall motion abnormalities. The left ventricular internal cavity size was normal in size. There is  mild concentric left ventricular hypertrophy. Left ventricular diastolic function could not be evaluated due to atrial fibrillation. Left ventricular diastolic function could not be evaluated. Right Ventricle: The right ventricular size is normal. No increase in right ventricular wall thickness. Right ventricular systolic function is normal. There is mildly elevated pulmonary artery systolic pressure. The tricuspid regurgitant velocity is 2.65  m/s, and with an assumed right atrial pressure of 15 mmHg, the estimated right ventricular systolic pressure is 87.6 mmHg. Left Atrium: Left atrial size was mild to moderately dilated. Right Atrium: Right atrial size was normal in size. Pericardium: Trivial pericardial effusion is present. Mitral Valve: The mitral valve is grossly normal. Trivial mitral valve regurgitation. No evidence of mitral valve stenosis. Tricuspid Valve: The tricuspid valve is grossly normal. Tricuspid valve regurgitation is trivial. No evidence of tricuspid stenosis. Aortic Valve: The aortic valve is tricuspid. Aortic valve regurgitation is not visualized. No aortic stenosis is present. Aortic valve mean gradient measures 6.0 mmHg. Aortic valve peak gradient measures 9.1 mmHg. Aortic valve area, by VTI measures 1.97 cm. Pulmonic Valve: The pulmonic valve was grossly normal. Pulmonic valve regurgitation is not visualized. No evidence of pulmonic stenosis. Aorta: The aortic root and ascending aorta are structurally normal, with no evidence of dilitation. Venous: The inferior vena cava is dilated in size with less than 50% respiratory variability, suggesting right atrial pressure of 15 mmHg. IAS/Shunts: The atrial septum is grossly normal.  LEFT VENTRICLE PLAX 2D LVIDd:         4.70 cm      Diastology LV PW:         1.00 cm     LV e' medial:    6.85 cm/s LV IVS:        1.20 cm     LV E/e' medial:  16.4 LVOT diam:     2.00 cm     LV e' lateral:   13.10 cm/s LV SV:         45          LV E/e' lateral: 8.5 LV SV Index:   25 LVOT Area:     3.14 cm  LV Volumes (MOD) LV vol d, MOD A2C: 35.1 ml LV vol d, MOD A4C: 51.2 ml LV vol s, MOD A2C: 16.7 ml LV vol s, MOD A4C: 27.2 ml LV SV MOD A2C:     18.4 ml LV SV MOD A4C:     51.2 ml LV SV MOD BP:      21.7 ml RIGHT VENTRICLE RV Basal diam:  3.40 cm RV Mid diam:    3.20 cm RV S prime:     15.70 cm/s TAPSE (M-mode): 1.8 cm LEFT ATRIUM             Index       RIGHT ATRIUM           Index LA diam:        4.20 cm 2.32 cm/m  RA Area:     15.90 cm LA Vol (A2C):   88.9 ml 49.02 ml/m RA Volume:   41.50 ml  22.88 ml/m LA Vol (A4C):   71.9 ml 39.64 ml/m LA Biplane Vol: 80.9 ml 44.61 ml/m  AORTIC VALVE  PULMONIC VALVE AV Area (Vmax):    2.16 cm     PV Vmax:       0.95 m/s AV Area (Vmean):   1.73 cm     PV Vmean:      67.900 cm/s AV Area (VTI):     1.97 cm     PV VTI:        0.151 m AV Vmax:           151.00 cm/s  PV Peak grad:  3.6 mmHg AV Vmean:          122.000 cm/s PV Mean grad:  2.0 mmHg AV VTI:            0.228 m AV Peak Grad:      9.1 mmHg AV Mean Grad:      6.0 mmHg LVOT Vmax:         104.00 cm/s LVOT Vmean:        67.300 cm/s LVOT VTI:          0.143 m LVOT/AV VTI ratio: 0.63  AORTA Ao Root diam: 3.10 cm Ao Asc diam:  3.40 cm MV E velocity: 112.00 cm/s  TRICUSPID VALVE                             TR Peak grad:   28.1 mmHg                             TR Vmax:        265.00 cm/s                              SHUNTS                             Systemic VTI:  0.14 m                             Systemic Diam: 2.00 cm Eleonore Chiquito MD Electronically signed by Eleonore Chiquito MD Signature Date/Time: 03/08/2021/4:51:33 PM    Final      Scheduled Meds: . dextromethorphan  15 mg Oral BID  . diclofenac Sodium  2 g Topical QID  . doxycycline  100  mg Oral Q12H  . feeding supplement (GLUCERNA SHAKE)  237 mL Oral BID BM  . metoprolol tartrate  25 mg Oral BID  . mometasone-formoterol  2 puff Inhalation BID  . montelukast  10 mg Oral QHS  . multivitamin with minerals  1 tablet Oral Daily  . pantoprazole  40 mg Oral BID  . predniSONE  40 mg Oral QAC breakfast  . Ensure Max Protein  11 oz Oral QHS  . sodium chloride flush  3 mL Intravenous Q12H   Continuous Infusions: . amiodarone 30 mg/hr (03/10/21 0957)  . heparin 1,800 Units/hr (03/10/21 0018)  . lactated ringers    . lactated ringers Stopped (03/08/21 1112)     LOS: 4 days   Time spent: 16 minutes  Nita Sells, MD [facility] To contact the attending provider between 7A-7P or the covering provider during after hours 7P-7A, please log into the web site [email or web address] and access using universal [place] password for that web site. If you do not have the password, please call the hospital operator.  03/10/2021, 11:21 AM

## 2021-03-10 NOTE — Progress Notes (Signed)
RT entered room to place pt on CPAP dream station for the night and pt stated that she did not want to wear it tonight d/t she is nauseated and feels like she will throw up. RT expressed to pt that if anything changes to please call for placement. RN notified. RT will continue to monitor.

## 2021-03-11 ENCOUNTER — Other Ambulatory Visit: Payer: Self-pay

## 2021-03-11 ENCOUNTER — Encounter (HOSPITAL_COMMUNITY): Payer: Self-pay | Admitting: Internal Medicine

## 2021-03-11 DIAGNOSIS — I4891 Unspecified atrial fibrillation: Secondary | ICD-10-CM | POA: Diagnosis not present

## 2021-03-11 LAB — COMPREHENSIVE METABOLIC PANEL
ALT: 23 U/L (ref 0–44)
AST: 17 U/L (ref 15–41)
Albumin: 2.5 g/dL — ABNORMAL LOW (ref 3.5–5.0)
Alkaline Phosphatase: 46 U/L (ref 38–126)
Anion gap: 8 (ref 5–15)
BUN: 21 mg/dL (ref 8–23)
CO2: 23 mmol/L (ref 22–32)
Calcium: 8.5 mg/dL — ABNORMAL LOW (ref 8.9–10.3)
Chloride: 106 mmol/L (ref 98–111)
Creatinine, Ser: 0.88 mg/dL (ref 0.44–1.00)
GFR, Estimated: >60 mL/min (ref >60)
Glucose, Bld: 113 mg/dL — ABNORMAL HIGH (ref 70–99)
Potassium: 4.3 mmol/L (ref 3.5–5.1)
Sodium: 137 mmol/L (ref 135–145)
Total Bilirubin: 0.3 mg/dL (ref 0.3–1.2)
Total Protein: 4.6 g/dL — ABNORMAL LOW (ref 6.5–8.1)

## 2021-03-11 LAB — CULTURE, BLOOD (ROUTINE X 2)
Culture: NO GROWTH
Culture: NO GROWTH

## 2021-03-11 LAB — CBC
HCT: 31.6 % — ABNORMAL LOW (ref 36.0–46.0)
Hemoglobin: 10.5 g/dL — ABNORMAL LOW (ref 12.0–15.0)
MCH: 28.5 pg (ref 26.0–34.0)
MCHC: 33.2 g/dL (ref 30.0–36.0)
MCV: 85.9 fL (ref 80.0–100.0)
Platelets: 173 10*3/uL (ref 150–400)
RBC: 3.68 MIL/uL — ABNORMAL LOW (ref 3.87–5.11)
RDW: 15.6 % — ABNORMAL HIGH (ref 11.5–15.5)
WBC: 7 10*3/uL (ref 4.0–10.5)
nRBC: 0 % (ref 0.0–0.2)

## 2021-03-11 LAB — HEPARIN LEVEL (UNFRACTIONATED): Heparin Unfractionated: 0.51 IU/mL (ref 0.30–0.70)

## 2021-03-11 MED ORDER — METOPROLOL TARTRATE 50 MG PO TABS
50.0000 mg | ORAL_TABLET | Freq: Two times a day (BID) | ORAL | Status: DC
  Administered 2021-03-11 – 2021-03-15 (×9): 50 mg via ORAL
  Filled 2021-03-11 (×9): qty 1

## 2021-03-11 NOTE — Progress Notes (Signed)
PROGRESS NOTE   Alisha Terrell  FXT:[id number] DOB: 03-02-1941 DOA: 03/06/2021 PCP: Christain [place], MD  Brief Narrative:  80 year old community dwelling white female Known history of migraines followed by Dr. Tomi Likens of neurology-TIA [phone] 10/12/2015 Obstructive sleep apnea followed right coronary stone pulmonology [place] (AutoPap 5-10) Asthma COVID recovered 07/02/2020 Dry weight about 184 pounds Follows locally with cardiology-event monitor shows 20 to 30% bradycardia burden-no A. fib was noted  Presented to [facility] with several days of malaise weakness fatigue anorexia in the setting of having swelling of the toe about a week   Found to be in A. fib heart rates in the 200s  blood pressure 92 O2 sat 88% up to 98% on 3 L  Lactic acid 2.2, BUN/creatinine up from baseline -->3.4 Anion gap 18 WBC 17  Patient given 2.5 L crystalloid-amiodarone started-empiric vancomycin ceftriaxone started Found to have left second toe with bullae Critical care evaluated felt severe sepsis without shock Podiatrist consulted-unroofed the blister obtain cultures and got x-rays  A. fib has proven difficult to control-cardiology on board-patient still remains on amnio gtt. and there are plans?  For cardioversion later this wee  Hospital-Problem based course  Severe sepsis secondary to left foot cellulitis/bullae IV-->doxycycline 2/2-patient growing pansensitive staph from 5/25 Per podiatry Pain control tramadol 50 every 6 as needed Obstructive asthma followed by [facility] CXR repeat 5/27 no active disease Cough improved 5/29 with start of prednisone still some wheeze Continue Singulair 10, ProAir/albuterol nebs, Flonase Use Xopenex more so than albuterol given heart rate Resume dextromethorphan New onset A. fib RVR CHADS2 score >4/heparin currently At home is on atenolol HR still uncontrolled meds: Metoprolol 50 twice daily, amiodarone IV 30 mg/H Cardioversion later this  week per cardiology HTN Echo this admission normal EF 55-60, cannot comment on diastolic parameters Holding losartan 100, Lasix 20, amlodipine 5 At home is on atenolol 50 twice daily ?  Consider readdition of Lasix in the outpatient setting (is on Lasix at home) ATN on admission Creatinine steadily improving with holding ARB, diuretics However is 8 L positive baseline weight in the outpatient setting is 184 pounds (83 kg) OSA/CPAP AutoPap settings 5-10 Ordered at bedtime mandatory CPAP Symptomatic management Complicated migraines/prior TIA followed by Dr. Tomi Likens neurology Not currently on any meds Continue aspirin 81 GDMT unlikely thyroid diathesis Recheck TSH outpatient setting-T4 spuriously given hospitalization and alteration of HPA axis No further hospital work-up of this   DVT prophylaxis: Heparin Code Status: Full Family Communication: I spoke with the patient's son and daughter-in-law in detail on 03/10/2021 Disposition:  Status is: Inpatient  Remains inpatient appropriate because:Hemodynamically unstable, Unsafe d/c plan and IV treatments appropriate due to intensity of illness or inability to take PO   Dispo: The patient is from: Home              Anticipated d/c is to: Unclear at this time              Patient currently is not medically stable to d/c.   Difficult to place patient No  Consultants:   Cardiology  Critical care  Procedures: Multiple  Antimicrobials: As above   Subjective:  Pleasant coherent no distress looks much more rested and seems to be wheezing less and breathing better Tolerated CPAP overnight No chest pain Does not feel swollen   Objective: Vitals:   03/11/21 0028 03/11/21 0525 03/11/21 0526 03/11/21 0532  BP: 122/87 (!) 137/97    Pulse: (!) 111 (!) 118  Resp: 20 17    Temp: 98 F (36.7 C) 98.2 F (36.8 C) 98.2 F (36.8 C)   TempSrc:  Oral    SpO2: 97% 92% 97%   Weight:    87.2 kg  Height:        Intake/Output Summary  (Last 24 hours) at 03/11/2021 1029 Last data filed at 03/11/2021 0800 Gross per 24 hour  Intake 1356.36 ml  Output 100 ml  Net 1256.36 ml   Filed Weights   03/09/21 0410 03/10/21 0355 03/11/21 0532  Weight: 86.9 kg 86.7 kg 87.2 kg    Examination:  Mallampati 4 EOMI NCAT pleasant well-appearing No rales no rhonchi today wheezes decreased posterolaterally Abdomen is soft Cannot appreciate JVD Trace lower extremity edema Neurologically intact moving all 4 limbs Euthymic  Data Reviewed: personally reviewed   Potassium 3.4--> 4.3 BUNs/creatinine 55/3.4-->53/2.3-->54/1.5--> 41/1.1-->24/0.8-->21/0.8 CO2 19--> 23 White count 17.7-->16.2-->11.1--> 7.0 Hemoglobin 15.0-->11.8-->10.5--> 10.5 Platelet 185-->165-->130--> 117--> 173   Radiology Studies: No results found.   Scheduled Meds: . dextromethorphan  15 mg Oral BID  . diclofenac Sodium  2 g Topical QID  . doxycycline  100 mg Oral Q12H  . feeding supplement (GLUCERNA SHAKE)  237 mL Oral BID BM  . metoprolol tartrate  50 mg Oral BID  . mometasone-formoterol  2 puff Inhalation BID  . montelukast  10 mg Oral QHS  . multivitamin with minerals  1 tablet Oral Daily  . pantoprazole  40 mg Oral BID  . predniSONE  40 mg Oral QAC breakfast  . Ensure Max Protein  11 oz Oral QHS  . sodium chloride flush  3 mL Intravenous Q12H   Continuous Infusions: . amiodarone 30 mg/hr (03/11/21 0818)  . heparin 1,800 Units/hr (03/11/21 0800)  . lactated ringers    . lactated ringers Stopped (03/08/21 1112)     LOS: 5 days   Time spent: 57 minutes  Nita Sells, MD [facility] To contact the attending provider between 7A-7P or the covering provider during after hours 7P-7A, please log into the web site [email or web address] and access using universal [place] password for that web site. If you do not have the password, please call the hospital operator.  03/11/2021, 10:29 AM

## 2021-03-11 NOTE — Progress Notes (Signed)
ANTICOAGULATION CONSULT NOTE - Follow Up Consult  Pharmacy Consult for Heparin Indication: atrial fibrillation  Allergies  Allergen Reactions  . Codeine Rash and Other (See Comments)    Agitation, bad dreams  . Omnicef [Cefdinir] Nausea And Vomiting    Confirmed with patient at bedside that no rash - just had significant GI effects    Patient Measurements: Height: 4\' 11"  (149.9 cm) Weight: 87.2 kg (192 lb 3.9 oz) IBW/kg (Calculated) : 43.2 Heparin Dosing Weight: 62  Vital Signs: Temp: 98.2 F (36.8 C) (05/30 0526) Temp Source: Oral (05/30 0525) BP: 137/97 (05/30 0525) Pulse Rate: 118 (05/30 0525)  Labs: Recent Labs    03/09/21 0232 03/09/21 0931 03/10/21 0552 03/11/21 0230  HGB 10.3*  --  10.5* 10.5*  HCT 30.2*  --  30.7* 31.6*  PLT 117*  --  153 173  APTT  --  20*  --   --   HEPARINUNFRC 0.32  --  0.51 0.51  CREATININE 1.17*  --  0.87 0.88    Estimated Creatinine Clearance: 48.9 mL/min (by C-G formula based on SCr of 0.88 mg/dL).  Assessment: 1 YOF who presented with Afib, was started on IV heparin for potential cardioversion and surgical interventions for cellulitis. No AC PTA.    Heparin level continues to be therapeutic at 0.51 on 1800 units/hr. No s/sx of bleeding or infusion issues. Hgb 10.5 (stable), PLTs 173 (improved).   Goal of Therapy:  Heparin level 0.3-0.7 units/ml Monitor platelets by anticoagulation protocol: Yes   Plan:  Heparin 1800 units/hour Monitor daily CBC and heparin levels Follow up transition to oral [facility]  Norina Buzzard, PharmD PGY1 Pharmacy Resident 03/11/2021 7:34 AM  Please check [email or web address] for unit specific pharmacy phone numbers.

## 2021-03-11 NOTE — Care Management Important Message (Signed)
Important Message  Patient Details  Name: Alisha Terrell MRN: [id number] Date of Birth: April 13, 1941   Medicare Important Message Given:  Yes     [name] 03/11/2021, 8:39 AM

## 2021-03-11 NOTE — Progress Notes (Signed)
Patient refused CPAP for tonight 

## 2021-03-11 NOTE — Progress Notes (Signed)
Progress Note  Patient Name: Alisha Terrell Date of Encounter: 03/11/2021  [facility] Cardiologist: Elouise Munroe, MD   Subjective   Feeling well today, no cp or sob.  Inpatient Medications    Scheduled Meds: . dextromethorphan  15 mg Oral BID  . diclofenac Sodium  2 g Topical QID  . doxycycline  100 mg Oral Q12H  . feeding supplement (GLUCERNA SHAKE)  237 mL Oral BID BM  . metoprolol tartrate  25 mg Oral BID  . mometasone-formoterol  2 puff Inhalation BID  . montelukast  10 mg Oral QHS  . multivitamin with minerals  1 tablet Oral Daily  . pantoprazole  40 mg Oral BID  . predniSONE  40 mg Oral QAC breakfast  . Ensure Max Protein  11 oz Oral QHS  . sodium chloride flush  3 mL Intravenous Q12H   Continuous Infusions: . amiodarone 30 mg/hr (03/11/21 0818)  . heparin 1,800 Units/hr (03/11/21 0516)  . lactated ringers    . lactated ringers Stopped (03/08/21 1112)   PRN Meds: acetaminophen **OR** acetaminophen, albuterol, levalbuterol, ondansetron **OR** ondansetron (ZOFRAN) IV, traMADol   Vital Signs    Vitals:   03/11/21 0028 03/11/21 0525 03/11/21 0526 03/11/21 0532  BP: 122/87 (!) 137/97    Pulse: (!) 111 (!) 118    Resp: 20 17    Temp: 98 F (36.7 C) 98.2 F (36.8 C) 98.2 F (36.8 C)   TempSrc:  Oral    SpO2: 97% 92% 97%   Weight:    87.2 kg  Height:        Intake/Output Summary (Last 24 hours) at 03/11/2021 0819 Last data filed at 03/10/2021 1600 Gross per 24 hour  Intake 120 ml  Output 300 ml  Net -180 ml   Last 3 Weights 03/11/2021 03/10/2021 03/09/2021  Weight (lbs) 192 lb 3.9 oz 191 lb 2.2 oz 191 lb 9.3 oz  Weight (kg) 87.2 kg 86.7 kg 86.9 kg      Telemetry    Atrial fibrillation with RVR Personally Reviewed  Physical Exam   GEN: No acute distress.   Neck: No JVD Cardiac: irregular and tachycardic Respiratory: Clear to auscultation bilaterally. GI: Soft, nontender, non-distended  MS: No edema; No deformity. Neuro:  Nonfocal   Psych: Normal affect   Chemistry Recent Labs  Lab 03/09/21 0232 03/10/21 0552 03/11/21 0230  NA 132* 135 137  K 4.0 4.0 4.3  CL 106 106 106  CO2 20* 23 23  GLUCOSE 112* 108* 113*  BUN 41* 24* 21  CREATININE 1.17* 0.87 0.88  CALCIUM 7.8* 8.1* 8.5*  PROT 4.3* 4.6* 4.6*  ALBUMIN 2.0* 2.2* 2.5*  AST 16 17 17   ALT 21 25 23   ALKPHOS 42 40 46  BILITOT 0.5 0.5 0.3  GFRNONAA 47* >60 >60  ANIONGAP 6 6 8      Hematology Recent Labs  Lab 03/09/21 0232 03/10/21 0552 03/11/21 0230  WBC 8.8 6.2 7.0  RBC 3.54* 3.58* 3.68*  HGB 10.3* 10.5* 10.5*  HCT 30.2* 30.7* 31.6*  MCV 85.3 85.8 85.9  MCH 29.1 29.3 28.5  MCHC 34.1 34.2 33.2  RDW 15.2 15.4 15.6*  PLT 117* 153 173    BNP Recent Labs  Lab 03/08/21 1043  BNP 309.0*    Radiology    No results found.  Patient Profile     80 yo female with history of HTN, asthma, TIA, OSA on CPAP,obesity, chronic back pain, who presented to the ER with c/o weakness,  nausea, vomiting, heart palpitation, and nothing to eat and drink for 3 days, found to have sepsis with lactic acidosis >2 2/2 presumed toe blister with infection, AKI, and A. fib with RVR. Cardiology is consulted and  following for afib.  Echocardiogram shows normal LV function, mild left ventricular hypertrophy, mild to moderate left atrial enlargement.  Assessment & Plan    1 Newly diagnosed atrial fibrillation- CHADS2VASC 5 (HTN, age x 2, TIA x 2) - Increase metoprolol to 50 mg twice daily.   - on IV amiodarone, ok to continue today. Consider transition to oral given several days of IV and concern for somewhat difficult IV access. - continue IV heparin and transition to apixaban when it is clear no further procedures are necessary, with regard to left foot toe - consider TEE guided cardioversion in the coming week, likely Thursday or Friday.  2 sepsis-improving.  Felt to be secondary to toe abscess/cellulitis.  Continue antibiotics per primary team.  3 acute renal  insufficiency- improved  4 hypertension-Home medications including amlodipine, atenolol, Lasix and losartan are being held at present given low blood pressure.  We will continue to follow.  5 elevated T4-TSH is normal.  Further evaluation per hospitalist service.  For questions or updates, please contact [place] Please consult [email or web address] for contact info under        Signed, Elouise Munroe, MD  03/11/2021, 8:19 AM

## 2021-03-12 ENCOUNTER — Other Ambulatory Visit (HOSPITAL_COMMUNITY): Payer: Self-pay

## 2021-03-12 DIAGNOSIS — I4891 Unspecified atrial fibrillation: Secondary | ICD-10-CM | POA: Diagnosis not present

## 2021-03-12 LAB — CBC
HCT: 33.3 % — ABNORMAL LOW (ref 36.0–46.0)
Hemoglobin: 11.3 g/dL — ABNORMAL LOW (ref 12.0–15.0)
MCH: 29 pg (ref 26.0–34.0)
MCHC: 33.9 g/dL (ref 30.0–36.0)
MCV: 85.6 fL (ref 80.0–100.0)
Platelets: 258 10*3/uL (ref 150–400)
RBC: 3.89 MIL/uL (ref 3.87–5.11)
RDW: 15.5 % (ref 11.5–15.5)
WBC: 9.6 10*3/uL (ref 4.0–10.5)
nRBC: 0 % (ref 0.0–0.2)

## 2021-03-12 LAB — COMPREHENSIVE METABOLIC PANEL
ALT: 25 U/L (ref 0–44)
AST: 17 U/L (ref 15–41)
Albumin: 2.6 g/dL — ABNORMAL LOW (ref 3.5–5.0)
Alkaline Phosphatase: 42 U/L (ref 38–126)
Anion gap: 10 (ref 5–15)
BUN: 17 mg/dL (ref 8–23)
CO2: 23 mmol/L (ref 22–32)
Calcium: 8.7 mg/dL — ABNORMAL LOW (ref 8.9–10.3)
Chloride: 102 mmol/L (ref 98–111)
Creatinine, Ser: 0.93 mg/dL (ref 0.44–1.00)
GFR, Estimated: >60 mL/min (ref >60)
Glucose, Bld: 106 mg/dL — ABNORMAL HIGH (ref 70–99)
Potassium: 4.2 mmol/L (ref 3.5–5.1)
Sodium: 135 mmol/L (ref 135–145)
Total Bilirubin: 0.4 mg/dL (ref 0.3–1.2)
Total Protein: 5.2 g/dL — ABNORMAL LOW (ref 6.5–8.1)

## 2021-03-12 LAB — HEPARIN LEVEL (UNFRACTIONATED): Heparin Unfractionated: 0.47 IU/mL (ref 0.30–0.70)

## 2021-03-12 MED ORDER — APIXABAN 5 MG PO TABS
5.0000 mg | ORAL_TABLET | Freq: Two times a day (BID) | ORAL | Status: DC
  Administered 2021-03-12 – 2021-03-15 (×7): 5 mg via ORAL
  Filled 2021-03-12 (×7): qty 1

## 2021-03-12 MED ORDER — DILTIAZEM HCL 30 MG PO TABS
30.0000 mg | ORAL_TABLET | Freq: Four times a day (QID) | ORAL | Status: DC
  Administered 2021-03-12 – 2021-03-13 (×4): 30 mg via ORAL
  Filled 2021-03-12 (×4): qty 1

## 2021-03-12 MED ORDER — PREDNISONE 20 MG PO TABS
20.0000 mg | ORAL_TABLET | Freq: Every day | ORAL | Status: DC
  Administered 2021-03-13: 20 mg via ORAL
  Filled 2021-03-12: qty 1

## 2021-03-12 MED ORDER — AMIODARONE HCL 200 MG PO TABS
400.0000 mg | ORAL_TABLET | Freq: Two times a day (BID) | ORAL | Status: DC
  Administered 2021-03-12 – 2021-03-14 (×5): 400 mg via ORAL
  Filled 2021-03-12 (×5): qty 2

## 2021-03-12 NOTE — TOC Benefit Eligibility Note (Signed)
Patient Teacher, English as a foreign language completed.    The patient is currently admitted and upon discharge could be taking Eliquis 5 mg.  The current 30 day co-pay is, $47.00.   The patient is insured through [place], [place] Patient Advocate Specialist [place] Team Direct Number: [phone]  Fax: [phone]

## 2021-03-12 NOTE — Progress Notes (Signed)
   03/12/21 2245  BiPAP/CPAP/SIPAP  $ Non-Invasive Ventilator  Non-Invasive Vent Set Up  $ Non-Invasive Home Ventilator  Initial  BiPAP/CPAP/SIPAP Pt Type Adult  Mask Type Full face mask  Mask Size Small  Respiratory Rate 28 breaths/min  IPAP  (min 4 max 14)  Oxygen Percent 21 %  BiPAP/CPAP /SiPAP Vitals  Pulse Rate (!) 136  Resp (!) 25  SpO2 98 %  Placed pt. On cpap pt. Didn't tolerate hospital unit will bring home unit

## 2021-03-12 NOTE — Progress Notes (Signed)
PROGRESS NOTE   Alisha Terrell  TML:[id number] DOB: 1941-02-04 DOA: 03/06/2021 PCP: Christain [place], MD  Brief Narrative:  80 year old community dwelling white female Known history of migraines followed by Dr. Tomi Likens of neurology-TIA [phone] 10/12/2015 Obstructive sleep apnea followed right coronary [facility] pul[facility] [place] (AutoPap 5-10) Asthma COVID recovered 07/02/2020 Dry weight about 184 pounds Follows locally with cardiology-event monitor shows 20 to 30% bradycardia burden-no A. fib was noted  Presented to [facility] with several days of malaise weakness fatigue anorexia in the setting of having swelling of the toe about a week   Found to be in A. fib heart rates in the 200s  blood pressure 92 O2 sat 88% up to 98% on 3 L  Lactic acid 2.2, BUN/creatinine up from baseline -->3.4 Anion gap 18 WBC 17  Patient given 2.5 L crystalloid-amiodarone started-empiric vancomycin ceftriaxone started Found to have left second toe with bullae Critical care evaluated felt severe sepsis without shock Podiatrist consulted-unroofed the blister obtain cultures and got x-rays  A. fib has proven difficult to control-cardiology on board-and adjusting meds  Hospital-Problem based course  Severe sepsis secondary to left foot cellulitis/bullae IV-->doxycycline 2/2-patient growing pansensitive staph from 5/25--stop date 03/19/2021 and defer further management-Per podiatry Pain control tramadol 50 every 6 as needed Obstructive asthma followed by [facility] CXR repeat 5/27 no active disease Cough has much improved improved since 5/29 --Taper prednisone today to 20 and continue to taper over the next week Continue Singulair 10, ProAir/albuterol nebs, Flonase Use Xopenex more so than albuterol given heart rate Resume dextromethorphan New onset A. fib RVR CHADS2 score >4/heparin currently At home is on atenolol HR still uncontrolled  meds: Metoprolol 50 twice daily--now on amiodarone  400 twice daily p.o. Cardiology adding Cardizem since 30 every 6 and will continue to adjust appreciate input HTN Echo this admission normal EF 55-60, cannot comment on diastolic parameters Holding losartan 100, Lasix 20, amlodipine 5 At home is on atenolol 50 twice daily ?  Consider readdition of Lasix in the outpatient setting (is on Lasix at home) ATN on admission Creatinine steadily improving with holding ARB, diuretics 8 L positive baseline weight in the outpatient setting is 184 pounds (83 kg) OSA/CPAP AutoPap settings 5-10 Ordered at bedtime mandatory CPAP Symptomatic management Complicated migraines/prior TIA followed by Dr. Tomi Likens neurology Not currently on any meds Continue aspirin 81 GDMT unlikely thyroid diathesis Recheck TSH outpatient setting-T4 spuriously given hospitalization and alteration of HPA axis No further hospital work-up of this   DVT prophylaxis: Heparin Code Status: Full Family Communication: I spoke with the patient's son and daughter-in-law in detail on 03/10/2021 She will update family herself Disposition:  Status is: Inpatient  Remains inpatient appropriate because:Hemodynamically unstable, Unsafe d/c plan and IV treatments appropriate due to intensity of illness or inability to take PO   Dispo: The patient is from: Home              Anticipated d/c is to: Unclear at this time              Patient currently is not medically stable to d/c.   Difficult to place patient No  Consultants:   Cardiology  Critical care  Procedures: Multiple  Antimicrobials: As above   Subjective:  Pleasant coherent Looks improved from prior evaluations No chest pain 1 episode of catching her breath and shortness of breath earlier today but resolved Sitting in chair Eating and drinking Feels improved overall  Objective: Vitals:   03/11/21  2341 03/12/21 0410 03/12/21 0741 03/12/21 0800  BP: 138/90 (!) 132/99    Pulse: 99 76  68  Resp: 18 20  19   Temp:  97.6 F (36.4 C) 97.7 F (36.5 C)    TempSrc: Oral Oral    SpO2: 100% 98% 94% 95%  Weight:  87.2 kg    Height:        Intake/Output Summary (Last 24 hours) at 03/12/2021 0942 Last data filed at 03/12/2021 0800 Gross per 24 hour  Intake 1171.65 ml  Output 1100 ml  Net 71.65 ml   Filed Weights   03/10/21 0355 03/11/21 0532 03/12/21 0410  Weight: 86.7 kg 87.2 kg 87.2 kg    Examination:  Mallampati 4 Pleasant coherent no distress No wheeze rales rhonchi Abdomen soft no rebound no guarding No JVD Neurologically intact no focal deficit  Data Reviewed: personally reviewed   Potassium 3.4--> 4.2 BUNs/creatinine 55/3.4-->53/2.3-->54/1.5--> 41/1.1-->24/0.8--> 17/0.9 CO2 19--> 23 White count 17.7-->16.2-->11.1--> 9.6 Hemoglobin 15.0-->11.8-->10.5--> 11.3 Platelet 185-->165-->130--> 117--> currently up to 258   Radiology Studies: No results found.   Scheduled Meds: . amiodarone  400 mg Oral BID  . apixaban  5 mg Oral BID  . dextromethorphan  15 mg Oral BID  . diclofenac Sodium  2 g Topical QID  . diltiazem  30 mg Oral Q6H  . doxycycline  100 mg Oral Q12H  . feeding supplement (GLUCERNA SHAKE)  237 mL Oral BID BM  . metoprolol tartrate  50 mg Oral BID  . mometasone-formoterol  2 puff Inhalation BID  . montelukast  10 mg Oral QHS  . multivitamin with minerals  1 tablet Oral Daily  . pantoprazole  40 mg Oral BID  . predniSONE  40 mg Oral QAC breakfast  . Ensure Max Protein  11 oz Oral QHS  . sodium chloride flush  3 mL Intravenous Q12H   Continuous Infusions: . lactated ringers    . lactated ringers Stopped (03/08/21 1112)     LOS: 6 days   Time spent: 25 minutes  Nita Sells, MD [facility] To contact the attending provider between 7A-7P or the covering provider during after hours 7P-7A, please log into the web site [email or web address] and access using universal [place] password for that web site. If you do not have the password, please call the  hospital operator.  03/12/2021, 9:42 AM

## 2021-03-12 NOTE — Progress Notes (Signed)
Subjective:  Patient ID: [name], female    DOB: 01/12/1941,  MRN: [id number]  Seen bedside. Mild pain in the toe. Denies other complaints. Objective:   Vitals:   03/12/21 0741 03/12/21 0800  BP:    Pulse:  68  Resp:  19  Temp:    SpO2: 94% 95%   AAO x3, NAD-sitting in bed with feet off the bed and family at bedside DP/PT pulses palpable bilaterally, CRT less than 3 seconds Superficial area skin breakdown dorsal aspect the second toe with mild bleeding present.  There is no purulence identified today.  No active erythema or warmth.  There is no fluctuation or crepitation but there is no malodor. No pain with calf compression, erythema or warmth.  Results for orders placed or performed during the hospital encounter of 03/06/21  Culture, blood (x 2)     Status: None   Collection Time: 03/06/21 12:01 PM   Specimen: BLOOD RIGHT HAND  Result Value Ref Range Status   Specimen Description BLOOD RIGHT HAND  Final   Special Requests   Final    BOTTLES DRAWN AEROBIC AND ANAEROBIC Blood Culture results may not be optimal due to an inadequate volume of blood received in culture bottles   Culture   Final    NO GROWTH 5 DAYS Performed at [facility], [place] [address], [place], [place] [address]    Report Status 03/11/2021 FINAL  Final  Resp Panel by RT-PCR (Flu A&B, Covid) Nasopharyngeal Swab     Status: None   Collection Time: 03/06/21  2:23 PM   Specimen: Nasopharyngeal Swab; Nasopharyngeal(NP) swabs in vial transport medium  Result Value Ref Range Status   SARS Coronavirus 2 by RT PCR NEGATIVE NEGATIVE Final    Comment: (NOTE) SARS-CoV-2 target nucleic acids are NOT DETECTED.  The SARS-CoV-2 RNA is generally detectable in upper respiratory specimens during the acute phase of infection. The lowest concentration of SARS-CoV-2 viral copies this assay can detect is 138 copies/mL. A negative result does not preclude SARS-Cov-2 infection and should not be used as the sole  basis for treatment or other patient management decisions. A negative result may occur with  improper specimen collection/handling, submission of specimen other than nasopharyngeal swab, presence of viral mutation(s) within the areas targeted by this assay, and inadequate number of viral copies(<138 copies/mL). A negative result must be combined with clinical observations, patient history, and epidemiological information. The expected result is Negative.  Fact Sheet for Patients:  EntrepreneurPulse.com.au  Fact Sheet for Healthcare Providers:  IncredibleEmployment.be  This test is no t yet approved or cleared by the [place] FDA and  has been authorized for detection and/or diagnosis of SARS-CoV-2 by FDA under an Emergency Use Authorization (EUA). This EUA will remain  in effect (meaning this test can be used) for the duration of the COVID-19 declaration under Section 564(b)(1) of the Act, 21 U.S.C.section 360bbb-3(b)(1), unless the authorization is terminated  or revoked sooner.       Influenza A by PCR NEGATIVE NEGATIVE Final   Influenza B by PCR NEGATIVE NEGATIVE Final    Comment: (NOTE) The Xpert Xpress SARS-CoV-2/FLU/RSV plus assay is intended as an aid in the diagnosis of influenza from Nasopharyngeal swab specimens and should not be used as a sole basis for treatment. Nasal washings and aspirates are unacceptable for Xpert Xpress SARS-CoV-2/FLU/RSV testing.  Fact Sheet for Patients: EntrepreneurPulse.com.au  Fact Sheet for Healthcare Providers: IncredibleEmployment.be  This test is not yet approved or cleared  by the [place] and has been authorized for detection and/or diagnosis of SARS-CoV-2 by FDA under an Emergency Use Authorization (EUA). This EUA will remain in effect (meaning this test can be used) for the duration of the COVID-19 declaration under Section 564(b)(1) of the Act,  21 U.S.C. section 360bbb-3(b)(1), unless the authorization is terminated or revoked.  Performed at [facility], [place] [address], [place], [place] [address]   Culture, blood (x 2)     Status: None   Collection Time: 03/06/21  5:01 PM   Specimen: BLOOD LEFT HAND  Result Value Ref Range Status   Specimen Description BLOOD LEFT HAND  Final   Special Requests   Final    BOTTLES DRAWN AEROBIC AND ANAEROBIC Blood Culture results may not be optimal due to an inadequate volume of blood received in culture bottles   Culture   Final    NO GROWTH 5 DAYS Performed at [facility], [place] [address] [place], [place] [address]    Report Status 03/11/2021 FINAL  Final  Aerobic Culture w Gram Stain (superficial specimen)     Status: None   Collection Time: 03/06/21  5:54 PM   Specimen: Abscess  Result Value Ref Range Status   Specimen Description ABSCESS LEFT TOE  Final   Special Requests NONE  Final   Gram Stain   Final    NO WBC SEEN FEW GRAM POSITIVE COCCI Performed at [facility], [place] [address], [place], [place] [address]    Culture ABUNDANT STAPHYLOCOCCUS AUREUS  Final   Report Status 03/10/2021 FINAL  Final   Organism ID, Bacteria STAPHYLOCOCCUS AUREUS  Final      Susceptibility   Staphylococcus aureus - MIC*    CIPROFLOXACIN <=0.5 SENSITIVE Sensitive     ERYTHROMYCIN <=0.25 SENSITIVE Sensitive     GENTAMICIN <=0.5 SENSITIVE Sensitive     OXACILLIN 2 SENSITIVE Sensitive     TETRACYCLINE <=1 SENSITIVE Sensitive     VANCOMYCIN <=0.5 SENSITIVE Sensitive     TRIMETH/SULFA <=10 SENSITIVE Sensitive     CLINDAMYCIN <=0.25 SENSITIVE Sensitive     RIFAMPIN <=0.5 SENSITIVE Sensitive     Inducible Clindamycin NEGATIVE Sensitive     * ABUNDANT STAPHYLOCOCCUS AUREUS     Assessment & Plan:  Patient was evaluated and treated and all questions answered.  Left 2nd toe ulcer -Continues to improve -Recc PO abx at D/c x1 week. Consider cipro -Wound cleansed and dressed with  adaptic, 4x4, kling. -Change dressing as such daily. -Plan for local care without surgical intervention. -Ok for d/c from pedal standpoint. -Will f/u if still hospitalized later in the week. -Patient to follow up in the office Friday -WBAT in surgical shoe.  Evelina Bucy, DPM  Accessible via secure chat for questions or concerns.

## 2021-03-12 NOTE — Progress Notes (Signed)
Progress Note  Patient Name: Alisha Terrell Date of Encounter: 03/12/2021  [facility] Cardiologist: Elouise Munroe, MD   Subjective   Pt denies CP or dyspnea  Inpatient Medications    Scheduled Meds: . dextromethorphan  15 mg Oral BID  . diclofenac Sodium  2 g Topical QID  . doxycycline  100 mg Oral Q12H  . feeding supplement (GLUCERNA SHAKE)  237 mL Oral BID BM  . metoprolol tartrate  50 mg Oral BID  . mometasone-formoterol  2 puff Inhalation BID  . montelukast  10 mg Oral QHS  . multivitamin with minerals  1 tablet Oral Daily  . pantoprazole  40 mg Oral BID  . predniSONE  40 mg Oral QAC breakfast  . Ensure Max Protein  11 oz Oral QHS  . sodium chloride flush  3 mL Intravenous Q12H   Continuous Infusions: . amiodarone 30 mg/hr (03/12/21 0800)  . heparin 1,800 Units/hr (03/12/21 0800)  . lactated ringers    . lactated ringers Stopped (03/08/21 1112)   PRN Meds: acetaminophen **OR** acetaminophen, albuterol, levalbuterol, ondansetron **OR** ondansetron (ZOFRAN) IV, traMADol   Vital Signs    Vitals:   03/11/21 2341 03/12/21 0410 03/12/21 0741 03/12/21 0800  BP: 138/90 (!) 132/99    Pulse: 99 76  68  Resp: 18 20  19   Temp: 97.6 F (36.4 C) 97.7 F (36.5 C)    TempSrc: Oral Oral    SpO2: 100% 98% 94% 95%  Weight:  87.2 kg    Height:        Intake/Output Summary (Last 24 hours) at 03/12/2021 0912 Last data filed at 03/12/2021 0800 Gross per 24 hour  Intake 1171.65 ml  Output 1100 ml  Net 71.65 ml   Last 3 Weights 03/12/2021 03/11/2021 03/10/2021  Weight (lbs) 192 lb 3.9 oz 192 lb 3.9 oz 191 lb 2.2 oz  Weight (kg) 87.2 kg 87.2 kg 86.7 kg      Telemetry    Atrial fibrillation with elevated rate- Personally Reviewed  Physical Exam   GEN: WD WN NAD Neck: supple, no JVD Cardiac: irregular and tachycardic Respiratory: CTA; no wheeze GI: Soft, NT/ND, no masses MS: No edema Neuro:  No focal findings Psych: Normal affect    Chemistry Recent  Labs  Lab 03/10/21 0552 03/11/21 0230 03/12/21 0353  NA 135 137 135  K 4.0 4.3 4.2  CL 106 106 102  CO2 23 23 23   GLUCOSE 108* 113* 106*  BUN 24* 21 17  CREATININE 0.87 0.88 0.93  CALCIUM 8.1* 8.5* 8.7*  PROT 4.6* 4.6* 5.2*  ALBUMIN 2.2* 2.5* 2.6*  AST 17 17 17   ALT 25 23 25   ALKPHOS 40 46 42  BILITOT 0.5 0.3 0.4  GFRNONAA >60 >60 >60  ANIONGAP 6 8 10      Hematology Recent Labs  Lab 03/10/21 0552 03/11/21 0230 03/12/21 0353  WBC 6.2 7.0 9.6  RBC 3.58* 3.68* 3.89  HGB 10.5* 10.5* 11.3*  HCT 30.7* 31.6* 33.3*  MCV 85.8 85.9 85.6  MCH 29.3 28.5 29.0  MCHC 34.2 33.2 33.9  RDW 15.4 15.6* 15.5  PLT 153 173 258    BNP Recent Labs  Lab 03/08/21 1043  BNP 309.0*    Patient Profile     80 yo female with history of HTN, asthma, TIA, OSA on CPAP,obesity, chronic back pain, who presented to the ER with c/o weakness, nausea, vomiting, heart palpitation, and nothing to eat and drink for 3 days, found to have  sepsis with lactic acidosis >2 2/2 presumed toe blister with infection, AKI, and A. fib with RVR. Cardiology is consulted and  following for afib.  Echocardiogram shows normal LV function, mild left ventricular hypertrophy, mild to moderate left atrial enlargement.  Assessment & Plan    1 newly diagnosed atrial fibrillation-patient remains in atrial fibrillation with elevated rate.  I will change IV amiodarone to 400 mg by mouth twice daily for 1 week then 200 mg daily thereafter.  Continue metoprolol.  Blood pressure has improved.  Add Cardizem 30 mg every 6 hours.  If rate continues to be difficult to control we will need to arrange TEE guided cardioversion in the coming week.  Patient will not require any further procedures.  We will discontinue IV heparin and instead treat with apixaban 5 mg twice daily.  2 sepsis-antibiotics per primary care.  3 acute renal insufficiency-resolved  4 hypertension-blood pressure increasing following treatment of sepsis.  We will add  Cardizem both for blood pressure and rate control.  Follow blood pressure and adjust medications as needed.   For questions or updates, please contact [place] Please consult [email or web address] for contact info under        Signed, Kirk Ruths, MD  03/12/2021, 9:12 AM

## 2021-03-12 NOTE — Progress Notes (Signed)
ANTICOAGULATION CONSULT NOTE - Follow Up Consult  Pharmacy Consult for Heparin Indication: atrial fibrillation  Allergies  Allergen Reactions  . Codeine Rash and Other (See Comments)    Agitation, bad dreams  . Omnicef [Cefdinir] Nausea And Vomiting    Confirmed with patient at bedside that no rash - just had significant GI effects    Patient Measurements: Height: 4\' 11"  (149.9 cm) Weight: 87.2 kg (192 lb 3.9 oz) IBW/kg (Calculated) : 43.2 Heparin Dosing Weight: 62  Vital Signs: Temp: 97.7 F (36.5 C) (05/31 0410) Temp Source: Oral (05/31 0410) BP: 132/99 (05/31 0410) Pulse Rate: 68 (05/31 0800)  Labs: Recent Labs    03/09/21 0931 03/10/21 0552 03/10/21 0552 03/11/21 0230 03/12/21 0353  HGB  --  10.5*   < > 10.5* 11.3*  HCT  --  30.7*  --  31.6* 33.3*  PLT  --  153  --  173 258  APTT 20*  --   --   --   --   HEPARINUNFRC  --  0.51  --  0.51 0.47  CREATININE  --  0.87  --  0.88 0.93   < > = values in this interval not displayed.    Estimated Creatinine Clearance: 46.3 mL/min (by C-G formula based on SCr of 0.93 mg/dL).  Assessment: 46 YOF who presented with Afib, was started on IV heparin for potential cardioversion and surgical interventions for cellulitis. No AC PTA.    Heparin level continues to be therapeutic at 0.47 on 1800 units/hr. No s/sx of bleeding or infusion issues.  Goal of Therapy:  Heparin level 0.3-0.7 units/ml Monitor platelets by anticoagulation protocol: Yes   Plan:  Heparin 1800 units/hour Monitor daily CBC and heparin levels Follow up transition to oral AC  Thank you Anette Guarneri, PharmD 03/12/2021 8:23 AM  Please check [email or web address] for unit specific pharmacy phone numbers.

## 2021-03-13 DIAGNOSIS — I10 Essential (primary) hypertension: Secondary | ICD-10-CM

## 2021-03-13 LAB — COMPREHENSIVE METABOLIC PANEL
ALT: 18 U/L (ref 0–44)
AST: 15 U/L (ref 15–41)
Albumin: 2.5 g/dL — ABNORMAL LOW (ref 3.5–5.0)
Alkaline Phosphatase: 36 U/L — ABNORMAL LOW (ref 38–126)
Anion gap: 11 (ref 5–15)
BUN: 14 mg/dL (ref 8–23)
CO2: 21 mmol/L — ABNORMAL LOW (ref 22–32)
Calcium: 8.7 mg/dL — ABNORMAL LOW (ref 8.9–10.3)
Chloride: 104 mmol/L (ref 98–111)
Creatinine, Ser: 0.88 mg/dL (ref 0.44–1.00)
GFR, Estimated: >60 mL/min (ref >60)
Glucose, Bld: 97 mg/dL (ref 70–99)
Potassium: 4 mmol/L (ref 3.5–5.1)
Sodium: 136 mmol/L (ref 135–145)
Total Bilirubin: 0.9 mg/dL (ref 0.3–1.2)
Total Protein: 5 g/dL — ABNORMAL LOW (ref 6.5–8.1)

## 2021-03-13 LAB — CBC
HCT: 33.8 % — ABNORMAL LOW (ref 36.0–46.0)
Hemoglobin: 11.4 g/dL — ABNORMAL LOW (ref 12.0–15.0)
MCH: 29.1 pg (ref 26.0–34.0)
MCHC: 33.7 g/dL (ref 30.0–36.0)
MCV: 86.2 fL (ref 80.0–100.0)
Platelets: 245 10*3/uL (ref 150–400)
RBC: 3.92 MIL/uL (ref 3.87–5.11)
RDW: 15.5 % (ref 11.5–15.5)
WBC: 8.5 10*3/uL (ref 4.0–10.5)
nRBC: 0 % (ref 0.0–0.2)

## 2021-03-13 MED ORDER — PREDNISONE 5 MG PO TABS
5.0000 mg | ORAL_TABLET | Freq: Every day | ORAL | Status: AC
  Administered 2021-03-14: 5 mg via ORAL
  Filled 2021-03-13: qty 1

## 2021-03-13 MED ORDER — DILTIAZEM HCL 60 MG PO TABS
60.0000 mg | ORAL_TABLET | Freq: Four times a day (QID) | ORAL | Status: DC
  Administered 2021-03-13 – 2021-03-14 (×4): 60 mg via ORAL
  Filled 2021-03-13 (×4): qty 1

## 2021-03-13 MED ORDER — ENSURE ENLIVE PO LIQD: 237.0000 mL | Freq: Two times a day (BID) | ORAL | Status: DC

## 2021-03-13 MED ORDER — ALUM & MAG HYDROXIDE-SIMETH 200-200-20 MG/5ML PO SUSP
30.0000 mL | ORAL | Status: DC | PRN
Start: 2021-03-13 — End: 2021-03-15
  Administered 2021-03-13 – 2021-03-15 (×4): 30 mL via ORAL
  Filled 2021-03-13 (×4): qty 30

## 2021-03-13 NOTE — Plan of Care (Signed)
  Problem: Education: Goal: Knowledge of General Education information will improve Description: Including pain rating scale, medication(s)/side effects and non-pharmacologic comfort measures Outcome: Progressing   Problem: Clinical Measurements: Goal: Respiratory complications will improve Outcome: Progressing Goal: Cardiovascular complication will be avoided Outcome: Progressing   Problem: Nutrition: Goal: Adequate nutrition will be maintained Outcome: Progressing   

## 2021-03-13 NOTE — Progress Notes (Signed)
Nutrition Follow-up  DOCUMENTATION CODES:   Obesity unspecified  INTERVENTION:   -D/c Glucerna shake -D/c Ensure Max -MVI with minerals daily -Ensure Enlive po BID, each supplement provides 350 kcal and 20 grams of protein  NUTRITION DIAGNOSIS:   Increased nutrient needs related to wound healing as evidenced by estimated needs.  Ongoing  GOAL:   Patient will meet greater than or equal to 90% of their needs  Progressing   MONITOR:   PO intake,Supplement acceptance,Labs,Weight trends,Skin,I & O's  REASON FOR ASSESSMENT:   Consult Assessment of nutrition requirement/status  ASSESSMENT:   Alisha Terrell is a 80 y.o. female with medical history significant of HTN; obesity; chronic back pain; and OSA on CPAP presenting with afib  Reviewed I/O's: -768 ml x 24 hours and +7.5 L since admission  UOP: 1.8 L x 24 hours  Spoke with pt, who was sitting in recliner chair at time of visit. She shares that she has a fair appetite, consumed some English muffin this morning. Noted meal completions 25-75%. Pt shares that her intake has been decreased secondary to medication side effects and nausea. She shares that she has PRN nausea medicine, which she used the other day and it worked well (she also requested it today).   Pt reports she has been sipping on Ensure supplements- noted she consumed a few sips. She estimates she consumed about one per day.   Discussed with pt how to combat nausea, including requesting PRN medications and ordering foods that are cold and have less odors. Pt is confident that she will eat more at lunch and ordered an egg salad sandwich.   Medications reviewed and include prednisone.   Labs reviewed.   Diet Order:   Diet Order            Diet Heart Room service appropriate? Yes; Fluid consistency: Thin  Diet effective now                 EDUCATION NEEDS:   Education needs have been addressed  Skin:  Skin Assessment: Skin Integrity  Issues: Skin Integrity Issues:: Other (Comment) Other: Superficial abscess left foot, cellulitis  Last BM:  03/06/21  Height:   Ht Readings from Last 1 Encounters:  03/06/21 4\' 11"  (1.499 m)    Weight:   Wt Readings from Last 1 Encounters:  03/13/21 86.5 kg    Ideal Body Weight:  44.7 kg  BMI:  Body mass index is 38.52 kg/m.  Estimated Nutritional Needs:   Kcal:  1550-1750  Protein:  90-105 grams  Fluid:  > 1.5 L    Loistine Chance, RD, LDN, [place] Registered Dietitian II Certified Diabetes Care and Education Specialist Please refer to [facility] for RD and/or RD on-call/weekend/after hours pager

## 2021-03-13 NOTE — Progress Notes (Signed)
Pt has home CPAP for the night.  RT set CPAP for pt.  Water added to chamber.

## 2021-03-13 NOTE — Evaluation (Signed)
Physical Therapy Evaluation Patient Details Name: Alisha Terrell MRN: [id number] DOB: 1941-06-08 Today's Date: 03/13/2021   History of Present Illness  Pt is an 80 y/o female admitted 5/25 secondary to new onset a fib and sepsis secondary to L foot cellulitis. PMH includes asthma, HTN, and OSA on CPAP.  Clinical Impression  Pt admitted secondary to problem above with deficits below. Mobility limited secondary to elevated HR. HR elevating to 153 during short distance mobility. Pt requiring min guard A for safety during mobility tasks using RW. Pt normally mod I with use of cane and is her husbands caretaker. Feel she would benefit from ST SNF to increase independence and safety with mobility prior to return home. Will continue to follow acutely.     Follow Up Recommendations SNF    Equipment Recommendations  Other (comment) (TBD pending progression)    Recommendations for Other Services       Precautions / Restrictions Precautions Precautions: Fall Restrictions Weight Bearing Restrictions: No      Mobility  Bed Mobility               General bed mobility comments: In chair upon entry    Transfers Overall transfer level: Needs assistance Equipment used: Rolling walker (2 wheeled) Transfers: Sit to/from Stand Sit to Stand: Min guard         General transfer comment: Min guard for safety. Cues for hand placement.  Ambulation/Gait Ambulation/Gait assistance: Min guard Gait Distance (Feet): 3 Feet Assistive device: Rolling walker (2 wheeled) Gait Pattern/deviations: Step-to pattern;Decreased weight shift to left;Antalgic Gait velocity: Decreased   General Gait Details: Mildly antalgic gait. Mild SOB noted and Pt reporting mild dizziness. HR elevating to 153, so further mobility deferred.  Stairs            Wheelchair Mobility    Modified Rankin (Stroke Patients Only)       Balance Overall balance assessment: Needs assistance Sitting-balance  support: No upper extremity supported;Feet supported Sitting balance-Leahy Scale: Fair     Standing balance support: Bilateral upper extremity supported;During functional activity Standing balance-Leahy Scale: Poor Standing balance comment: Reliant on BUE support                             Pertinent Vitals/Pain Pain Assessment: Faces Faces Pain Scale: Hurts a little bit Pain Location: L foot Pain Descriptors / Indicators: Aching Pain Intervention(s): Limited activity within patient's tolerance;Monitored during session;Repositioned    Home Living Family/patient expects to be discharged to:: Private residence Living Arrangements: Spouse/significant other;Children Available Help at Discharge: Family Type of Home: House Home Access: Stairs to enter Entrance Stairs-Rails: Right Entrance Stairs-Number of Steps: 1 Home Layout: Other (Comment) (has step to get into laundry room) Home Equipment: Gilford Rile - 2 wheels;Cane - single point Additional Comments: 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    Prior Function Level of Independence: Independent with assistive device(s)         Comments: Normally using cane for ambulation     Hand Dominance        Extremity/Trunk Assessment   Upper Extremity Assessment Upper Extremity Assessment: Defer to OT evaluation    Lower Extremity Assessment Lower Extremity Assessment: LLE deficits/detail;Generalized weakness LLE Deficits / Details: L foot wrapped in gauze    Cervical / Trunk Assessment Cervical / Trunk Assessment: Normal  Communication   Communication: No difficulties  Cognition Arousal/Alertness: Awake/alert Behavior During Therapy: WFL for tasks assessed/performed Overall Cognitive Status:  Within Functional Limits for tasks assessed                                        General Comments      Exercises     Assessment/Plan    PT Assessment Patient needs continued PT services  PT Problem  List Decreased strength;Decreased balance;Decreased mobility;Decreased activity tolerance;Decreased knowledge of use of DME;Decreased knowledge of precautions;Pain       PT Treatment Interventions DME instruction;Gait training;Functional mobility training;Therapeutic activities;Therapeutic exercise;Balance training;Patient/family education    PT Goals (Current goals can be found in the Care Plan section)  Acute Rehab PT Goals Patient Stated Goal: to get stronger and go home PT Goal Formulation: With patient Time For Goal Achievement: 03/27/21 Potential to Achieve Goals: Good    Frequency Min 2X/week   Barriers to discharge        Co-evaluation               AM-PAC PT "6 Clicks" Mobility  Outcome Measure Help needed turning from your back to your side while in a flat bed without using bedrails?: A Little Help needed moving from lying on your back to sitting on the side of a flat bed without using bedrails?: A Little Help needed moving to and from a bed to a chair (including a wheelchair)?: A Little Help needed standing up from a chair using your arms (e.g., wheelchair or bedside chair)?: A Little Help needed to walk in hospital room?: A Little Help needed climbing 3-5 steps with a railing? : A Lot 6 Click Score: 17    End of Session   Activity Tolerance: Treatment limited secondary to medical complications (Comment) (elevated HR) Patient left: in chair;with call bell/phone within reach Nurse Communication: Mobility status PT Visit Diagnosis: Unsteadiness on feet (R26.81);Muscle weakness (generalized) (M62.81);Difficulty in walking, not elsewhere classified (R26.2)    Time: 1037-1100 PT Time Calculation (min) (ACUTE ONLY): 23 min   Charges:   PT Evaluation $PT Eval Moderate Complexity: 1 Mod PT Treatments $Therapeutic Activity: 8-22 mins        Lou Miner, DPT  Acute Rehabilitation Services  Pager: [phone] Office: [phone]   Rudean Hitt 03/13/2021, 11:24 AM

## 2021-03-13 NOTE — Progress Notes (Signed)
Orthopedic Tech Progress Note Patient Details:  Alisha Terrell 04-Mar-1941 [id number]  Ortho Devices Type of Ortho Device: Postop shoe/boot Ortho Device/Splint Location: LLE Ortho Device/Splint Interventions: Application,Ordered   Post Interventions Patient Tolerated: Well Instructions Provided: Care of [place] 03/13/2021, 11:43 AM

## 2021-03-13 NOTE — Progress Notes (Signed)
Progress Note  Patient Name: Alisha Terrell Date of Encounter: 03/13/2021  [facility] Cardiologist: Elouise Munroe, MD   Subjective   No CP or dyspnea  Inpatient Medications    Scheduled Meds: . amiodarone  400 mg Oral BID  . apixaban  5 mg Oral BID  . dextromethorphan  15 mg Oral BID  . diclofenac Sodium  2 g Topical QID  . diltiazem  30 mg Oral Q6H  . doxycycline  100 mg Oral Q12H  . feeding supplement (GLUCERNA SHAKE)  237 mL Oral BID BM  . metoprolol tartrate  50 mg Oral BID  . mometasone-formoterol  2 puff Inhalation BID  . montelukast  10 mg Oral QHS  . multivitamin with minerals  1 tablet Oral Daily  . pantoprazole  40 mg Oral BID  . [START ON 03/14/2021] predniSONE  5 mg Oral QAC breakfast  . Ensure Max Protein  11 oz Oral QHS  . sodium chloride flush  3 mL Intravenous Q12H   Continuous Infusions:  PRN Meds: acetaminophen **OR** acetaminophen, albuterol, alum & mag hydroxide-simeth, levalbuterol, ondansetron **OR** ondansetron (ZOFRAN) IV, traMADol   Vital Signs    Vitals:   03/13/21 0742 03/13/21 0842 03/13/21 0845 03/13/21 1015  BP:  121/90 121/90 (!) 134/97  Pulse:  68 60 (!) 110  Resp:  20 18 18   Temp:  98 F (36.7 C) 98 F (36.7 C) 98 F (36.7 C)  TempSrc:  Oral    SpO2: 97%     Weight:      Height:        Intake/Output Summary (Last 24 hours) at 03/13/2021 1134 Last data filed at 03/13/2021 1100 Gross per 24 hour  Intake 609.28 ml  Output 2050 ml  Net -1440.72 ml   Last 3 Weights 03/13/2021 03/12/2021 03/11/2021  Weight (lbs) 190 lb 11.2 oz 192 lb 3.9 oz 192 lb 3.9 oz  Weight (kg) 86.5 kg 87.2 kg 87.2 kg      Telemetry    Atrial fibrillation with elevated rate- Personally Reviewed  Physical Exam   GEN: NAD Neck: supple Cardiac: irregular Respiratory: CTA; no rhonchi GI: Soft, NT/ND MS: No edema Neuro:  Grossly intact Psych: Normal affect    Chemistry Recent Labs  Lab 03/11/21 0230 03/12/21 0353 03/13/21 0332  NA 137  135 136  K 4.3 4.2 4.0  CL 106 102 104  CO2 23 23 21*  GLUCOSE 113* 106* 97  BUN 21 17 14   CREATININE 0.88 0.93 0.88  CALCIUM 8.5* 8.7* 8.7*  PROT 4.6* 5.2* 5.0*  ALBUMIN 2.5* 2.6* 2.5*  AST 17 17 15   ALT 23 25 18   ALKPHOS 46 42 36*  BILITOT 0.3 0.4 0.9  GFRNONAA >60 >60 >60  ANIONGAP 8 10 11      Hematology Recent Labs  Lab 03/11/21 0230 03/12/21 0353 03/13/21 0332  WBC 7.0 9.6 8.5  RBC 3.68* 3.89 3.92  HGB 10.5* 11.3* 11.4*  HCT 31.6* 33.3* 33.8*  MCV 85.9 85.6 86.2  MCH 28.5 29.0 29.1  MCHC 33.2 33.9 33.7  RDW 15.6* 15.5 15.5  PLT 173 258 245    BNP Recent Labs  Lab 03/08/21 1043  BNP 309.0*    Patient Profile     81 yo female with history of HTN, asthma, TIA, OSA on CPAP,obesity, chronic back pain, who presented to the ER with c/o weakness, nausea, vomiting, heart palpitation, and nothing to eat and drink for 3 days, found to have sepsis with lactic  acidosis >2 2/2 presumed toe blister with infection, AKI, and A. fib with RVR. Cardiology is consulted and  following for afib.  Echocardiogram shows normal LV function, mild left ventricular hypertrophy, mild to moderate left atrial enlargement.  Assessment & Plan    1 newly diagnosed atrial fibrillation-patient remains in atrial fibrillation.  Heart rate improving but still elevated.  Continue amiodarone 400 mg twice daily for 1 week then 200 mg daily thereafter.  Continue metoprolol.  Increase Cardizem to 60 mg every 6 hours.  We will transition to Cardizem CD once dose is stable.  Continue apixaban.  We will plan to proceed with cardioversion 3 weeks after anticoagulated if atrial fibrillation persists.  Note LV function is normal.  Atrial fibrillation likely due to the stress of her recent sepsis.    2 sepsis-antibiotics per primary care.  3 acute renal insufficiency-resolved  4 hypertension-blood pressure increasing following treatment of sepsis.  Increase Cardizem to 60 mg every 6 hours and follow.   For  questions or updates, please contact Leslie Please consult [email or web address] for contact info under        Signed, Kirk Ruths, MD  03/13/2021, 11:34 AM

## 2021-03-13 NOTE — Discharge Instructions (Signed)

## 2021-03-13 NOTE — Progress Notes (Signed)
[facility] PROGRESS NOTE    Progress Note  Alisha Terrell  TWS:[id number] DOB: [birth year] DOA: 03/06/2021 PCP: Christain Sacramento, MD     Brief Narrative:   Alisha Terrell is an 80 y.o. female past medical history of asthma, essential hypertension TIA obstructive sleep apnea on CPAP chronic back pain comes to the ED complaining of weakness nausea vomiting heart palpitations and decreased oral intake found to be septic probable source due to blisters infection, also found to be in acute kidney injury and A. fib with RVR and cardiology was consulted   Assessment/Plan:   Severe sepsis secondary to left foot cellulitis: Currently on oral doxycycline.  Has remained afebrile. Podiatry is on board recommended to continue wound care, and consider oral antibiotics for 1 week after discharge. Pain control on tramadol.  New onset atrial fibrillation [facility]) Cardiology has been consulted now rate controlled metoprolol and amiodarone. Cardiology recommended to continue amiodarone 400 mg twice a day for 1 week then 200 mg daily, continue metoprolol and Cardizem. Continue titrate diltiazem as heart rate tolerates it.  Heart rate continues to be elevated further titration per cardiology. Eliquis, if heart rate hard to control will need TEE cardioversion.  Essential hypertension: On amlodipine, metoprolol Lasix and diltiazem.  Acute kidney injury: Holding ARB. Creatinine less than 1 resolved with IV fluid hydration her creatinine back to baseline.  Obstructive sleep apnea: Continue CPAP at night.  Complicated migraine: Follow-up with neurology as an outpatient.   DVT prophylaxis: Eliquis Family Communication:none Status is: Inpatient  Remains inpatient appropriate because:Hemodynamically unstable   Dispo: The patient is from: Home              Anticipated d/c is to: SNF              Patient currently is not medically stable to d/c.   Difficult to place patient  No        Code Status:     Code Status Orders  (From admission, onward)         Start     Ordered   03/06/21 1740  Full code  Continuous        03/06/21 1745        Code Status History    This patient has a current code status but no historical code status.   Advance Care Planning Activity        IV Access:    Peripheral IV   Procedures and diagnostic studies:   No results found.   Medical Consultants:    None.   Subjective:    Alisha Terrell she relates she feels better is tolerating her diet some nausea this morning.  Objective:    Vitals:   03/13/21 0230 03/13/21 0453 03/13/21 0742 03/13/21 0842  BP:  (!) 138/97  121/90  Pulse:  77  (!) 56  Resp: 20 14  20   Temp:  97.9 F (36.6 C)  98 F (36.7 C)  TempSrc:  Oral  Oral  SpO2:  97% 97%   Weight:      Height:       SpO2: 97 % O2 Flow Rate (L/min): 2 L/min FiO2 (%): 28 %   Intake/Output Summary (Last 24 hours) at 03/13/2021 0900 Last data filed at 03/13/2021 0800 Gross per 24 hour  Intake 609.28 ml  Output 1450 ml  Net -840.72 ml   Filed Weights   03/11/21 0532 03/12/21 0410 03/13/21 0040  Weight: 87.2 kg 87.2 kg 86.5  kg    Exam: General exam: In no acute distress. Respiratory system: Good air movement and clear to auscultation. Cardiovascular system: S1 & S2 heard, RRR. No JVD. Gastrointestinal system: Abdomen is nondistended, soft and nontender.  Extremities: No pedal edema. Psychiatry: Judgement and insight appear normal. Mood & affect appropriate.    Data Reviewed:    Labs: Basic Metabolic Panel: Recent Labs  Lab 03/07/21 0250 03/08/21 0735 03/09/21 0232 03/10/21 0552 03/11/21 0230 03/12/21 0353 03/13/21 0332  NA 135 133* 132* 135 137 135 136  K 3.4* 3.4* 4.0 4.0 4.3 4.2 4.0  CL 103 106 106 106 106 102 104  CO2 19* 19* 20* 23 23 23  21*  GLUCOSE 120* 121* 112* 108* 113* 106* 97  BUN 53* 54* 41* 24* 21 17 14   CREATININE 2.32* 1.58* 1.17* 0.87 0.88 0.93  0.88  CALCIUM 7.4* 7.5* 7.8* 8.1* 8.5* 8.7* 8.7*  MG 2.1 2.1  --  1.9  --   --   --    GFR Estimated Creatinine Clearance: 48.7 mL/min (by C-G formula based on SCr of 0.88 mg/dL). Liver Function Tests: Recent Labs  Lab 03/09/21 0232 03/10/21 0552 03/11/21 0230 03/12/21 0353 03/13/21 0332  AST 16 17 17 17 15   ALT 21 25 23 25 18   ALKPHOS 42 40 46 42 36*  BILITOT 0.5 0.5 0.3 0.4 0.9  PROT 4.3* 4.6* 4.6* 5.2* 5.0*  ALBUMIN 2.0* 2.2* 2.5* 2.6* 2.5*   No results for input(s): LIPASE, AMYLASE in the last 168 hours. No results for input(s): AMMONIA in the last 168 hours. Coagulation profile Recent Labs  Lab 03/06/21 1201  INR 1.2   COVID-19 Labs  No results for input(s): DDIMER, FERRITIN, LDH, CRP in the last 72 hours.  Lab Results  Component Value Date   [place] NEGATIVE 03/06/2021    CBC: Recent Labs  Lab 03/06/21 1305 03/07/21 0250 03/08/21 0735 03/09/21 0232 03/10/21 0552 03/11/21 0230 03/12/21 0353 03/13/21 0332  WBC 17.7*   < > 11.1* 8.8 6.2 7.0 9.6 8.5  NEUTROABS 16.5*  --  9.5*  --   --   --   --   --   HGB 15.0   < > 10.5* 10.3* 10.5* 10.5* 11.3* 11.4*  HCT 45.9   < > 30.4* 30.2* 30.7* 31.6* 33.3* 33.8*  MCV 88.3   < > 84.7 85.3 85.8 85.9 85.6 86.2  PLT 185   < > 130* 117* 153 173 258 245   < > = values in this interval not displayed.   Cardiac Enzymes: No results for input(s): CKTOTAL, CKMB, CKMBINDEX, TROPONINI in the last 168 hours. BNP (last 3 results) No results for input(s): PROBNP in the last 8760 hours. CBG: No results for input(s): GLUCAP in the last 168 hours. D-Dimer: No results for input(s): DDIMER in the last 72 hours. Hgb A1c: No results for input(s): HGBA1C in the last 72 hours. Lipid Profile: No results for input(s): CHOL, HDL, LDLCALC, TRIG, CHOLHDL, LDLDIRECT in the last 72 hours. Thyroid function studies: No results for input(s): TSH, T4TOTAL, T3FREE, THYROIDAB in the last 72 hours.  Invalid input(s): FREET3 Anemia work  up: No results for input(s): VITAMINB12, FOLATE, FERRITIN, TIBC, IRON, RETICCTPCT in the last 72 hours. Sepsis Labs: Recent Labs  Lab 03/06/21 1701 03/06/21 1952 03/06/21 2345 03/07/21 0250 03/08/21 1043 03/09/21 0232 03/10/21 0552 03/11/21 0230 03/12/21 0353 03/13/21 0332  PROCALCITON  --  12.36  --   --   --   --   --   --   --   --  WBC  --   --   --    < >  --    < > 6.2 7.0 9.6 8.5  LATICACIDVEN 2.3* 2.7* 2.2*  --  1.6  --   --   --   --   --    < > = values in this interval not displayed.   Microbiology Recent Results (from the past 240 hour(s))  Culture, blood (x 2)     Status: None   Collection Time: 03/06/21 12:01 PM   Specimen: BLOOD RIGHT HAND  Result Value Ref Range Status   Specimen Description BLOOD RIGHT HAND  Final   Special Requests   Final    BOTTLES DRAWN AEROBIC AND ANAEROBIC Blood Culture results may not be optimal due to an inadequate volume of blood received in culture bottles   Culture   Final    NO GROWTH 5 DAYS Performed at [facility], [place] [address] [place], [place] [address]    Report Status 03/11/2021 FINAL  Final  Resp Panel by RT-PCR (Flu A&B, Covid) Nasopharyngeal Swab     Status: None   Collection Time: 03/06/21  2:23 PM   Specimen: Nasopharyngeal Swab; Nasopharyngeal(NP) swabs in vial transport medium  Result Value Ref Range Status   SARS Coronavirus 2 by RT PCR NEGATIVE NEGATIVE Final    Comment: (NOTE) SARS-CoV-2 target nucleic acids are NOT DETECTED.  The SARS-CoV-2 RNA is generally detectable in upper respiratory specimens during the acute phase of infection. The lowest concentration of SARS-CoV-2 viral copies this assay can detect is 138 copies/mL. A negative result does not preclude SARS-Cov-2 infection and should not be used as the sole basis for treatment or other patient management decisions. A negative result may occur with  improper specimen collection/handling, submission of specimen other than nasopharyngeal  swab, presence of viral mutation(s) within the areas targeted by this assay, and inadequate number of viral copies(<138 copies/mL). A negative result must be combined with clinical observations, patient history, and epidemiological information. The expected result is Negative.  Fact Sheet for Patients:  EntrepreneurPulse.com.au  Fact Sheet for Healthcare Providers:  IncredibleEmployment.be  This test is no t yet approved or cleared by the [place] FDA and  has been authorized for detection and/or diagnosis of SARS-CoV-2 by FDA under an Emergency Use Authorization (EUA). This EUA will remain  in effect (meaning this test can be used) for the duration of the COVID-19 declaration under Section 564(b)(1) of the Act, 21 U.S.C.section 360bbb-3(b)(1), unless the authorization is terminated  or revoked sooner.       Influenza A by PCR NEGATIVE NEGATIVE Final   Influenza B by PCR NEGATIVE NEGATIVE Final    Comment: (NOTE) The Xpert Xpress SARS-CoV-2/FLU/RSV plus assay is intended as an aid in the diagnosis of influenza from Nasopharyngeal swab specimens and should not be used as a sole basis for treatment. Nasal washings and aspirates are unacceptable for Xpert Xpress SARS-CoV-2/FLU/RSV testing.  Fact Sheet for Patients: EntrepreneurPulse.com.au  Fact Sheet for Healthcare Providers: IncredibleEmployment.be  This test is not yet approved or cleared by the [place] FDA and has been authorized for detection and/or diagnosis of SARS-CoV-2 by FDA under an Emergency Use Authorization (EUA). This EUA will remain in effect (meaning this test can be used) for the duration of the COVID-19 declaration under Section 564(b)(1) of the Act, 21 U.S.C. section 360bbb-3(b)(1), unless the authorization is terminated or revoked.  Performed at [facility], [place],  [place] [address]   Culture,  blood (x 2)     Status: None   Collection Time: 03/06/21  5:01 PM   Specimen: BLOOD LEFT HAND  Result Value Ref Range Status   Specimen Description BLOOD LEFT HAND  Final   Special Requests   Final    BOTTLES DRAWN AEROBIC AND ANAEROBIC Blood Culture results may not be optimal due to an inadequate volume of blood received in culture bottles   Culture   Final    NO GROWTH 5 DAYS Performed at [facility], [place] [address] [place], [place] [address]    Report Status 03/11/2021 FINAL  Final  Aerobic Culture w Gram Stain (superficial specimen)     Status: None   Collection Time: 03/06/21  5:54 PM   Specimen: Abscess  Result Value Ref Range Status   Specimen Description ABSCESS LEFT TOE  Final   Special Requests NONE  Final   Gram Stain   Final    NO WBC SEEN FEW GRAM POSITIVE COCCI Performed at [facility], [place] [address], [place], [place] [address]    Culture ABUNDANT STAPHYLOCOCCUS AUREUS  Final   Report Status 03/10/2021 FINAL  Final   Organism ID, Bacteria STAPHYLOCOCCUS AUREUS  Final      Susceptibility   Staphylococcus aureus - MIC*    CIPROFLOXACIN <=0.5 SENSITIVE Sensitive     ERYTHROMYCIN <=0.25 SENSITIVE Sensitive     GENTAMICIN <=0.5 SENSITIVE Sensitive     OXACILLIN 2 SENSITIVE Sensitive     TETRACYCLINE <=1 SENSITIVE Sensitive     VANCOMYCIN <=0.5 SENSITIVE Sensitive     TRIMETH/SULFA <=10 SENSITIVE Sensitive     CLINDAMYCIN <=0.25 SENSITIVE Sensitive     RIFAMPIN <=0.5 SENSITIVE Sensitive     Inducible Clindamycin NEGATIVE Sensitive     * ABUNDANT STAPHYLOCOCCUS AUREUS     Medications:   . amiodarone  400 mg Oral BID  . apixaban  5 mg Oral BID  . dextromethorphan  15 mg Oral BID  . diclofenac Sodium  2 g Topical QID  . diltiazem  30 mg Oral Q6H  . doxycycline  100 mg Oral Q12H  . feeding supplement (GLUCERNA SHAKE)  237 mL Oral BID BM  . metoprolol tartrate  50 mg Oral BID  . mometasone-formoterol  2 puff Inhalation BID  . montelukast  10 mg  Oral QHS  . multivitamin with minerals  1 tablet Oral Daily  . pantoprazole  40 mg Oral BID  . predniSONE  20 mg Oral QAC breakfast  . Ensure Max Protein  11 oz Oral QHS  . sodium chloride flush  3 mL Intravenous Q12H   Continuous Infusions: . lactated ringers    . lactated ringers Stopped (03/08/21 1112)      LOS: 7 days   Charlynne Cousins  [facility]  03/13/2021, 9:00 AM

## 2021-03-14 MED ORDER — AMIODARONE HCL 200 MG PO TABS
200.0000 mg | ORAL_TABLET | Freq: Every day | ORAL | Status: DC
  Administered 2021-03-15: 200 mg via ORAL
  Filled 2021-03-14 (×2): qty 1

## 2021-03-14 MED ORDER — DILTIAZEM HCL ER COATED BEADS 240 MG PO CP24
240.0000 mg | ORAL_CAPSULE | Freq: Every day | ORAL | Status: DC
  Administered 2021-03-14 – 2021-03-15 (×2): 240 mg via ORAL
  Filled 2021-03-14 (×2): qty 1

## 2021-03-14 MED ORDER — LOSARTAN POTASSIUM 25 MG PO TABS
25.0000 mg | ORAL_TABLET | Freq: Every day | ORAL | Status: DC
  Administered 2021-03-14 – 2021-03-15 (×2): 25 mg via ORAL
  Filled 2021-03-14 (×2): qty 1

## 2021-03-14 NOTE — Progress Notes (Signed)
Physical Therapy Treatment Patient Details Name: Alisha Terrell MRN: [id number] DOB: 02/28/1941 Today's Date: 03/14/2021    History of Present Illness Pt is an 80 y/o female admitted 5/25 secondary to new onset a fib and sepsis secondary to L foot cellulitis. PMH includes asthma, HTN, and OSA on CPAP.    PT Comments    Pt demonstrates ability to perform transfers and ambulate without requiring physical assistance to do so. Pt continues to require cues for hand placement during transfers and proximity to walker during mobility. Pt tolerates gait for increased distances compared to last session, requiring one standing rest break due to reports of increased WOB. Pt may benefit from gait training with rollator at next session for energy conservation. Pt continues to demonstrate deficits in strength and activity tolerance and will benefit from acute PT to improve safety and independence in mobility. SPT recommends HHPT as pt tolerates increased mobility and does not require physical assistance to mobilize.   Follow Up Recommendations  Home health PT (Pt discharge recommendation now HHPT due to progression in function.)     Equipment Recommendations  Standard walker    Recommendations for Other Services       Precautions / Restrictions Precautions Precautions: Fall Restrictions Weight Bearing Restrictions: No    Mobility  Bed Mobility               General bed mobility comments: In chair upon entry    Transfers Overall transfer level: Needs assistance Equipment used: Rolling walker (2 wheeled) Transfers: Sit to/from Stand Sit to Stand: Min guard         General transfer comment: min G for safety. Pt requires cues for hand placement.  Ambulation/Gait Ambulation/Gait assistance: Min guard Gait Distance (Feet): 100 Feet Assistive device: Rolling walker (2 wheeled) Gait Pattern/deviations: Step-to pattern;Decreased weight shift to left;Antalgic Gait velocity:  Decreased Gait velocity interpretation: 1.31 - 2.62 ft/sec, indicative of limited community ambulator General Gait Details: Pt HR up to 89 with activity, RR to 35. Mild SOB noted with one standing rest break to catch breath. Pt oxygen sats taken following gait in sitting, 96%.   Stairs             Wheelchair Mobility    Modified Rankin (Stroke Patients Only)       Balance Overall balance assessment: Needs assistance Sitting-balance support: No upper extremity supported;Feet supported Sitting balance-Leahy Scale: Fair     Standing balance support: Bilateral upper extremity supported;During functional activity Standing balance-Leahy Scale: Poor Standing balance comment: Reliant on BUE support                            Cognition Arousal/Alertness: Awake/alert Behavior During Therapy: WFL for tasks assessed/performed Overall Cognitive Status: Within Functional Limits for tasks assessed                                        Exercises      General Comments General comments (skin integrity, edema, etc.): Initally, HR at rest fluctuating between 68-75 and RR 22-24. With activity HR up to 89 and RR up to 35. Pt recovers quickly at end of session with HR at 66 and RR at 15.      Pertinent Vitals/Pain Pain Assessment: No/denies pain    Home Living  Prior Function            PT Goals (current goals can now be found in the care plan section) Acute Rehab PT Goals Patient Stated Goal: to get stronger and go home Progress towards PT goals: Progressing toward goals    Frequency    Min 3X/week      PT Plan Current plan remains appropriate    Co-evaluation              AM-PAC PT "6 Clicks" Mobility   Outcome Measure  Help needed turning from your back to your side while in a flat bed without using bedrails?: A Little Help needed moving from lying on your back to sitting on the side of a flat bed  without using bedrails?: A Little Help needed moving to and from a bed to a chair (including a wheelchair)?: A Little Help needed standing up from a chair using your arms (e.g., wheelchair or bedside chair)?: A Little Help needed to walk in hospital room?: A Little Help needed climbing 3-5 steps with a railing? : A Lot 6 Click Score: 17    End of Session Equipment Utilized During Treatment: Gait belt Activity Tolerance: Patient limited by fatigue Patient left: in chair;with call bell/phone within reach Nurse Communication: Mobility status PT Visit Diagnosis: Unsteadiness on feet (R26.81);Muscle weakness (generalized) (M62.81);Difficulty in walking, not elsewhere classified (R26.2)     Time: 5852-7782 PT Time Calculation (min) (ACUTE ONLY): 19 min  Charges:  $Gait Training: 8-22 mins                     Acute Rehab  Pager: [phone]    Garwin Brothers, SPT  03/14/2021, 5:52 PM

## 2021-03-14 NOTE — TOC Initial Note (Signed)
Transition of Care [facility]) - Initial/Assessment Note    Patient Details  Name: Alisha Terrell MRN: [id number] Date of Birth: July 10, 1941  Transition of Care [facility]) CM/SW Contact:    Tresa Endo Phone Number: 03/14/2021, 2:26 PM  Clinical Narrative:                 7902: CSW spoke with pt daughter she wants to discuss SNF with pt bc pt wants to go home, I explained HH as another option. Pt daughter will follow up after decision is made.        Patient Goals and CMS Choice        Expected Discharge Plan and Services                                                Prior Living Arrangements/Services                       Activities of Daily Living Home Assistive Devices/Equipment: Gilford Rile (specify type) ADL Screening (condition at time of admission) Patient's cognitive ability adequate to safely complete daily activities?: No Is the patient deaf or have difficulty hearing?: No Does the patient have difficulty seeing, even when wearing glasses/contacts?: No Does the patient have difficulty concentrating, remembering, or making decisions?: No Patient able to express need for assistance with ADLs?: Yes Does the patient have difficulty dressing or bathing?: No Independently performs ADLs?: Yes (appropriate for developmental age) Does the patient have difficulty walking or climbing stairs?: No Weakness of Legs: Both Weakness of Arms/Hands: None  Permission Sought/Granted                  Emotional Assessment              Admission diagnosis:  Tachycardia [R00.0] New onset atrial fibrillation ([place]) [I48.91] AKI (acute kidney injury) ([place]) [N17.9] Toe ulcer, left, limited to breakdown of skin ([place]) [L97.521] Severe sepsis ([place]) [A41.9, R65.20] Patient Active Problem List   Diagnosis Date Noted  . New onset atrial fibrillation ([place]) 03/06/2021  . AKI (acute kidney injury) ([place])   . Severe sepsis ([place])   . Toe ulcer, left, limited  to breakdown of skin ([place])   . Age-related osteoporosis without current pathological fracture 08/11/2018  . Thyroid nodule 08/11/2018  . Localized, primary osteoarthritis of hand 06/17/2018  . Bilateral hand pain 06/01/2018  . Encounter for Medicare annual wellness exam 07/29/2017  . Chronic obstructive asthma ([place]) 03/03/2016  . Allergic rhinitis 11/14/2015  . Arthritis involving multiple sites 11/14/2015  . Essential hypertension 11/14/2015  . GERD (gastroesophageal reflux disease) 11/14/2015  . Low back pain 11/14/2015  . Neck pain 11/14/2015  . Obesity 11/14/2015  . OSA (obstructive sleep apnea) 11/14/2015  . Postmenopausal estrogen deficiency 11/14/2015  . Situational insomnia 11/14/2015  . 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 TIA (transient ischemic attack) 11/13/2015   PCP:  Christain [place], MD Pharmacy:   [facility] [address], [place] [address] Phone: [phone] Fax: [phone]  [facility] [address] [place] [address] Phone: [phone] Fax: [phone]     Social Determinants of Health (SDOH) Interventions    Readmission Risk Interventions No flowsheet data found.

## 2021-03-14 NOTE — Progress Notes (Signed)
Pt has home CPAP, water added to chamber.

## 2021-03-14 NOTE — NC FL2 (Signed)
[place] LEVEL OF CARE SCREENING TOOL     IDENTIFICATION  Patient Name: Alisha Terrell Birthdate: 09-Oct-1941 Sex: female Admission Date (Current Location): 03/06/2021  [facility] and [place] Number:  Herbalist and Address:  [facility] [place], [place] [address], [place], [place] [address]      Provider Number: [id number]  Attending Physician Name and Address:  Charlynne Cousins, MD  Relative Name and Phone Number:  Cici, Rodriges (Daughter)   [phone] [facility])    Current Level of Care: Hospital Recommended Level of Care: [place] Prior Approval Number: [id number]  Date Approved/Denied:   PASRR Number:    Discharge Plan: SNF    Current Diagnoses: Patient Active Problem List   Diagnosis Date Noted  . New onset atrial fibrillation ([place]) 03/06/2021  . AKI (acute kidney injury) ([place])   . Severe sepsis ([place])   . Toe ulcer, left, limited to breakdown of skin ([place])   . Age-related osteoporosis without current pathological fracture 08/11/2018  . Thyroid nodule 08/11/2018  . Localized, primary osteoarthritis of hand 06/17/2018  . Bilateral hand pain 06/01/2018  . Encounter for Medicare annual wellness exam 07/29/2017  . 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 Allergic rhinitis 11/14/2015  . Arthritis involving multiple sites 11/14/2015  . Essential hypertension 11/14/2015  . GERD (gastroesophageal reflux disease) 11/14/2015  . Low back pain 11/14/2015  . Neck pain 11/14/2015  . Obesity 11/14/2015  . OSA (obstructive sleep apnea) 11/14/2015  . Postmenopausal estrogen deficiency 11/14/2015  . Situational insomnia 11/14/2015  . 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 TIA (transient ischemic attack) 11/13/2015    Orientation RESPIRATION BLADDER Height & Weight     Self,Time,Situation,Place  Normal External catheter,Continent Weight: 195 lb 5.2 oz (88.6 kg) Height:  4\' 11"  (149.9 cm)  BEHAVIORAL  SYMPTOMS/MOOD NEUROLOGICAL BOWEL NUTRITION STATUS      Continent Diet (See dc summary)  AMBULATORY STATUS COMMUNICATION OF NEEDS Skin   Limited Assist Verbally Normal                       Personal Care Assistance Level of Assistance  Bathing,Feeding,Dressing Bathing Assistance: Limited assistance Feeding assistance: Independent Dressing Assistance: Limited assistance     Functional Limitations Info  Sight,Hearing,Speech Sight Info: Impaired Hearing Info: Impaired Speech Info: Adequate    SPECIAL CARE FACTORS FREQUENCY  PT (By licensed PT),OT (By licensed OT)     PT Frequency: 5x/week OT Frequency: 5x/week            Contractures Contractures Info: Not present    Additional Factors Info  Code Status,Allergies Code Status Info: Full code Allergies Info: codeine, Omnicef (cefdinir)           Current Medications (03/14/2021):  This is the current hospital active medication list Current Facility-Administered Medications  Medication Dose Route Frequency Provider Last Rate Last Admin  . acetaminophen (TYLENOL) tablet 650 mg  650 mg Oral Q6H PRN Karmen Bongo, MD   650 mg at 03/10/21 2101   Or  . acetaminophen (TYLENOL) suppository 650 mg  650 mg Rectal Q6H PRN Karmen Bongo, MD      . albuterol (PROVENTIL) (2.5 MG/3ML) 0.083% nebulizer solution 3 mL  3 mL Inhalation Q6H PRN Karmen Bongo, MD   3 mL at 03/07/21 2323  . alum & mag hydroxide-simeth (MAALOX/MYLANTA) 200-200-20 MG/5ML suspension 30 mL  30 mL Oral Q4H PRN Nita Sells, MD   30 mL at 03/13/21 2029  . [  START ON 03/15/2021] amiodarone (PACERONE) tablet 200 mg  200 mg Oral Daily Lelon Perla, MD      . apixaban Arne [place]) tablet 5 mg  5 mg Oral BID Lelon Perla, MD   5 mg at 03/14/21 1257  . dextromethorphan (DELSYM) 30 MG/5ML liquid 15 mg  15 mg Oral BID Nita Sells, MD   15 mg at 03/14/21 1256  . diclofenac Sodium (VOLTAREN) 1 % topical gel 2 g  2 g Topical QID Nita Sells, MD   2 g at 03/14/21 1257  . diltiazem (CARDIZEM CD) 24 hr capsule 240 mg  240 mg Oral Daily Barrett, Rhonda G, PA-C   240 mg at 03/14/21 1308  . doxycycline (VIBRA-TABS) tablet 100 mg  100 mg Oral Q12H Nita Sells, MD   100 mg at 03/14/21 0955  . feeding supplement (ENSURE ENLIVE / ENSURE PLUS) liquid 237 mL  237 mL Oral BID BM Charlynne Cousins, MD      . levalbuterol [facility] HFA) inhaler 1-2 puff  1-2 puff Inhalation Q6H PRN Nita Sells, MD   2 puff at 03/10/21 1440  . losartan (COZAAR) tablet 25 mg  25 mg Oral Daily Charlynne Cousins, MD   25 mg at 03/14/21 1257  . metoprolol tartrate (LOPRESSOR) tablet 50 mg  50 mg Oral BID Cherlynn Kaiser A, MD   50 mg at 03/14/21 1257  . mometasone-formoterol (DULERA) 200-5 MCG/ACT inhaler 2 puff  2 puff Inhalation BID Karmen Bongo, MD   2 puff at 03/14/21 0743  . montelukast (SINGULAIR) tablet 10 mg  10 mg Oral Ivery Quale, MD   10 mg at 03/13/21 2029  . multivitamin with minerals tablet 1 tablet  1 tablet Oral Daily Nita Sells, MD   1 tablet at 03/12/21 0934  . ondansetron (ZOFRAN) tablet 4 mg  4 mg Oral Q6H PRN Karmen Bongo, MD   4 mg at 03/14/21 1043   Or  . ondansetron [facility]) injection 4 mg  4 mg Intravenous Q6H PRN Karmen Bongo, MD   4 mg at 03/11/21 0942  . pantoprazole (PROTONIX) EC tablet 40 mg  40 mg Oral BID Karmen Bongo, MD   40 mg at 03/14/21 1257  . sodium chloride flush (NS) 0.9 % injection 3 mL  3 mL Intravenous Q12H Karmen Bongo, MD   3 mL at 03/14/21 1251  . traMADol (ULTRAM) tablet 50 mg  50 mg Oral Q6H PRN Karmen Bongo, MD   50 mg at 03/14/21 1257     Discharge Medications: Please see discharge summary for a list of discharge medications.  Relevant Imaging Results:  Relevant Lab Results:   Additional Information SSN [place], [place]

## 2021-03-14 NOTE — Progress Notes (Signed)
[facility] PROGRESS NOTE    Progress Note  Alisha Terrell  YHC:[id number] DOB: [birth year] DOA: 03/06/2021 PCP: Christain Sacramento, MD     Brief Narrative:   Alisha Terrell is an 80 y.o. female past medical history of asthma, essential hypertension TIA obstructive sleep apnea on CPAP chronic back pain comes to the ED complaining of weakness nausea vomiting heart palpitations and decreased oral intake found to be septic probable source due to blisters infection, also found to be in acute kidney injury and A. fib with RVR and cardiology was consulted   Assessment/Plan:   Severe sepsis secondary to left foot cellulitis: Currently on oral doxycycline.  Has remained afebrile. Podiatry is on board recommended to continue wound care, and consider oral antibiotics for 1 week after discharge. Pain control on tramadol. Physical therapy evaluated the patient and recommended skilled nursing facility.  New onset atrial fibrillation [facility]) Cardiology has been consulted now rate controlled metoprolol, diltiazem and amiodarone. Continue Eliquis, TEE cardioversion as an outpatient. Now in sinus rhythm.  Essential hypertension: On amlodipine, metoprolol Lasix and diltiazem.  Acute kidney injury: Restarted ARB creatinine has returned to baseline.  Obstructive sleep apnea: Continue CPAP at night.  Complicated migraine: Follow-up with neurology as an outpatient.   DVT prophylaxis: Eliquis Family Communication:none Status is: Inpatient  Remains inpatient appropriate because:Hemodynamically unstable   Dispo: The patient is from: Home              Anticipated d/c is to: SNF              Patient currently is not medically stable to d/c.   Difficult to place patient No        Code Status:     Code Status Orders  (From admission, onward)         Start     Ordered   03/06/21 1740  Full code  Continuous        03/06/21 1745        Code Status History    This  patient has a current code status but no historical code status.   Advance Care Planning Activity        IV Access:    Peripheral IV   Procedures and diagnostic studies:   No results found.   Medical Consultants:    None.   Subjective:    Alisha Terrell relates she feels a lot better today, getting stronger on a day by day basis.  Objective:    Vitals:   03/13/21 2022 03/14/21 0000 03/14/21 0417 03/14/21 0743  BP:  136/66 (!) 152/84 (!) 141/64  Pulse:  (!) 55 61 63  Resp:  18 19   Temp:   97.6 F (36.4 C) 97.7 F (36.5 C)  TempSrc:   Oral Oral  SpO2: 94% 91% 96% 97%  Weight:   88.6 kg   Height:       SpO2: 97 % O2 Flow Rate (L/min): 2 L/min FiO2 (%): 28 %   Intake/Output Summary (Last 24 hours) at 03/14/2021 0843 Last data filed at 03/14/2021 0827 Gross per 24 hour  Intake 720 ml  Output 700 ml  Net 20 ml   Filed Weights   03/12/21 0410 03/13/21 0040 03/14/21 0417  Weight: 87.2 kg 86.5 kg 88.6 kg    Exam: General exam: In no acute distress. Respiratory system: Good air movement and clear to auscultation. Cardiovascular system: S1 & S2 heard, RRR. No JVD. Gastrointestinal system: Abdomen is nondistended,  soft and nontender.  Extremities: No pedal edema. Skin: No rashes, lesions or ulcers Psychiatry: Judgement and insight appear normal. Mood & affect appropriate.   Data Reviewed:    Labs: Basic Metabolic Panel: Recent Labs  Lab 03/08/21 0735 03/09/21 0232 03/10/21 0552 03/11/21 0230 03/12/21 0353 03/13/21 0332  NA 133* 132* 135 137 135 136  K 3.4* 4.0 4.0 4.3 4.2 4.0  CL 106 106 106 106 102 104  CO2 19* 20* 23 23 23  21*  GLUCOSE 121* 112* 108* 113* 106* 97  BUN 54* 41* 24* 21 17 14   CREATININE 1.58* 1.17* 0.87 0.88 0.93 0.88  CALCIUM 7.5* 7.8* 8.1* 8.5* 8.7* 8.7*  MG 2.1  --  1.9  --   --   --    GFR Estimated Creatinine Clearance: 49.4 mL/min (by C-G formula based on SCr of 0.88 mg/dL). Liver Function Tests: Recent Labs   Lab 03/09/21 0232 03/10/21 0552 03/11/21 0230 03/12/21 0353 03/13/21 0332  AST 16 17 17 17 15   ALT 21 25 23 25 18   ALKPHOS 42 40 46 42 36*  BILITOT 0.5 0.5 0.3 0.4 0.9  PROT 4.3* 4.6* 4.6* 5.2* 5.0*  ALBUMIN 2.0* 2.2* 2.5* 2.6* 2.5*   No results for input(s): LIPASE, AMYLASE in the last 168 hours. No results for input(s): AMMONIA in the last 168 hours. Coagulation profile No results for input(s): INR, PROTIME in the last 168 hours. COVID-19 Labs  No results for input(s): DDIMER, FERRITIN, LDH, CRP in the last 72 hours.  Lab Results  Component Value Date   [place] NEGATIVE 03/06/2021    CBC: Recent Labs  Lab 03/08/21 0735 03/09/21 0232 03/10/21 0552 03/11/21 0230 03/12/21 0353 03/13/21 0332  WBC 11.1* 8.8 6.2 7.0 9.6 8.5  NEUTROABS 9.5*  --   --   --   --   --   HGB 10.5* 10.3* 10.5* 10.5* 11.3* 11.4*  HCT 30.4* 30.2* 30.7* 31.6* 33.3* 33.8*  MCV 84.7 85.3 85.8 85.9 85.6 86.2  PLT 130* 117* 153 173 258 245   Cardiac Enzymes: No results for input(s): CKTOTAL, CKMB, CKMBINDEX, TROPONINI in the last 168 hours. BNP (last 3 results) No results for input(s): PROBNP in the last 8760 hours. CBG: No results for input(s): GLUCAP in the last 168 hours. D-Dimer: No results for input(s): DDIMER in the last 72 hours. Hgb A1c: No results for input(s): HGBA1C in the last 72 hours. Lipid Profile: No results for input(s): CHOL, HDL, LDLCALC, TRIG, CHOLHDL, LDLDIRECT in the last 72 hours. Thyroid function studies: No results for input(s): TSH, T4TOTAL, T3FREE, THYROIDAB in the last 72 hours.  Invalid input(s): FREET3 Anemia work up: No results for input(s): VITAMINB12, FOLATE, FERRITIN, TIBC, IRON, RETICCTPCT in the last 72 hours. Sepsis Labs: Recent Labs  Lab 03/08/21 1043 03/09/21 0232 03/10/21 0552 03/11/21 0230 03/12/21 0353 03/13/21 0332  WBC  --    < > 6.2 7.0 9.6 8.5  LATICACIDVEN 1.6  --   --   --   --   --    < > = values in this interval not  displayed.   Microbiology Recent Results (from the past 240 hour(s))  Culture, blood (x 2)     Status: None   Collection Time: 03/06/21 12:01 PM   Specimen: BLOOD RIGHT HAND  Result Value Ref Range Status   Specimen Description BLOOD RIGHT HAND  Final   Special Requests   Final    BOTTLES DRAWN AEROBIC AND ANAEROBIC Blood Culture results may not  be optimal due to an inadequate volume of blood received in culture bottles   Culture   Final    NO GROWTH 5 DAYS Performed at [facility], [place] [address] [place], [place] [address]    Report Status 03/11/2021 FINAL  Final  Resp Panel by RT-PCR (Flu A&B, Covid) Nasopharyngeal Swab     Status: None   Collection Time: 03/06/21  2:23 PM   Specimen: Nasopharyngeal Swab; Nasopharyngeal(NP) swabs in vial transport medium  Result Value Ref Range Status   SARS Coronavirus 2 by RT PCR NEGATIVE NEGATIVE Final    Comment: (NOTE) SARS-CoV-2 target nucleic acids are NOT DETECTED.  The SARS-CoV-2 RNA is generally detectable in upper respiratory specimens during the acute phase of infection. The lowest concentration of SARS-CoV-2 viral copies this assay can detect is 138 copies/mL. A negative result does not preclude SARS-Cov-2 infection and should not be used as the sole basis for treatment or other patient management decisions. A negative result may occur with  improper specimen collection/handling, submission of specimen other than nasopharyngeal swab, presence of viral mutation(s) within the areas targeted by this assay, and inadequate number of viral copies(<138 copies/mL). A negative result must be combined with clinical observations, patient history, and epidemiological information. The expected result is Negative.  Fact Sheet for Patients:  EntrepreneurPulse.com.au  Fact Sheet for Healthcare Providers:  IncredibleEmployment.be  This test is no t yet approved or cleared by the [place] FDA and   has been authorized for detection and/or diagnosis of SARS-CoV-2 by FDA under an Emergency Use Authorization (EUA). This EUA will remain  in effect (meaning this test can be used) for the duration of the COVID-19 declaration under Section 564(b)(1) of the Act, 21 U.S.C.section 360bbb-3(b)(1), unless the authorization is terminated  or revoked sooner.       Influenza A by PCR NEGATIVE NEGATIVE Final   Influenza B by PCR NEGATIVE NEGATIVE Final    Comment: (NOTE) The Xpert Xpress SARS-CoV-2/FLU/RSV plus assay is intended as an aid in the diagnosis of influenza from Nasopharyngeal swab specimens and should not be used as a sole basis for treatment. Nasal washings and aspirates are unacceptable for Xpert Xpress SARS-CoV-2/FLU/RSV testing.  Fact Sheet for Patients: EntrepreneurPulse.com.au  Fact Sheet for Healthcare Providers: IncredibleEmployment.be  This test is not yet approved or cleared by the [place] FDA and has been authorized for detection and/or diagnosis of SARS-CoV-2 by FDA under an Emergency Use Authorization (EUA). This EUA will remain in effect (meaning this test can be used) for the duration of the COVID-19 declaration under Section 564(b)(1) of the Act, 21 U.S.C. section 360bbb-3(b)(1), unless the authorization is terminated or revoked.  Performed at [facility], [place] [address], [place], [place] [address]   Culture, blood (x 2)     Status: None   Collection Time: 03/06/21  5:01 PM   Specimen: BLOOD LEFT HAND  Result Value Ref Range Status   Specimen Description BLOOD LEFT HAND  Final   Special Requests   Final    BOTTLES DRAWN AEROBIC AND ANAEROBIC Blood Culture results may not be optimal due to an inadequate volume of blood received in culture bottles   Culture   Final    NO GROWTH 5 DAYS Performed at [facility], [place] [address], [place], [place] [address]    Report Status 03/11/2021 FINAL  Final   Aerobic Culture w Gram Stain (superficial specimen)     Status: None   Collection  Time: 03/06/21  5:54 PM   Specimen: Abscess  Result Value Ref Range Status   Specimen Description ABSCESS LEFT TOE  Final   Special Requests NONE  Final   Gram Stain   Final    NO WBC SEEN FEW GRAM POSITIVE COCCI Performed at [facility], [address] [place], [place] [address]    Culture ABUNDANT STAPHYLOCOCCUS AUREUS  Final   Report Status 03/10/2021 FINAL  Final   Organism ID, Bacteria STAPHYLOCOCCUS AUREUS  Final      Susceptibility   Staphylococcus aureus - MIC*    CIPROFLOXACIN <=0.5 SENSITIVE Sensitive     ERYTHROMYCIN <=0.25 SENSITIVE Sensitive     GENTAMICIN <=0.5 SENSITIVE Sensitive     OXACILLIN 2 SENSITIVE Sensitive     TETRACYCLINE <=1 SENSITIVE Sensitive     VANCOMYCIN <=0.5 SENSITIVE Sensitive     TRIMETH/SULFA <=10 SENSITIVE Sensitive     CLINDAMYCIN <=0.25 SENSITIVE Sensitive     RIFAMPIN <=0.5 SENSITIVE Sensitive     Inducible Clindamycin NEGATIVE Sensitive     * ABUNDANT STAPHYLOCOCCUS AUREUS     Medications:   . amiodarone  400 mg Oral BID  . apixaban  5 mg Oral BID  . dextromethorphan  15 mg Oral BID  . diclofenac Sodium  2 g Topical QID  . diltiazem  60 mg Oral Q6H  . doxycycline  100 mg Oral Q12H  . feeding supplement  237 mL Oral BID BM  . metoprolol tartrate  50 mg Oral BID  . mometasone-formoterol  2 puff Inhalation BID  . montelukast  10 mg Oral QHS  . multivitamin with minerals  1 tablet Oral Daily  . pantoprazole  40 mg Oral BID  . sodium chloride flush  3 mL Intravenous Q12H   Continuous Infusions:     LOS: 8 days   Charlynne Cousins  [facility]  03/14/2021, 8:43 AM

## 2021-03-14 NOTE — Progress Notes (Addendum)
Progress Note  Patient Name: Alisha Terrell Date of Encounter: 03/14/2021  [facility] Cardiologist: Elouise Munroe, MD   Subjective   Gets winded w/ exertion, but is improving every day.  Inpatient Medications    Scheduled Meds: . amiodarone  400 mg Oral BID  . apixaban  5 mg Oral BID  . dextromethorphan  15 mg Oral BID  . diclofenac Sodium  2 g Topical QID  . diltiazem  60 mg Oral Q6H  . doxycycline  100 mg Oral Q12H  . feeding supplement  237 mL Oral BID BM  . losartan  25 mg Oral Daily  . metoprolol tartrate  50 mg Oral BID  . mometasone-formoterol  2 puff Inhalation BID  . montelukast  10 mg Oral QHS  . multivitamin with minerals  1 tablet Oral Daily  . pantoprazole  40 mg Oral BID  . sodium chloride flush  3 mL Intravenous Q12H   Continuous Infusions:  PRN Meds: acetaminophen **OR** acetaminophen, albuterol, alum & mag hydroxide-simeth, levalbuterol, ondansetron **OR** ondansetron (ZOFRAN) IV, traMADol   Vital Signs    Vitals:   03/13/21 2022 03/14/21 0000 03/14/21 0417 03/14/21 0743  BP:  136/66 (!) 152/84 (!) 141/64  Pulse:  (!) 55 61 63  Resp:  18 19   Temp:   97.6 F (36.4 C) 97.7 F (36.5 C)  TempSrc:   Oral Oral  SpO2: 94% 91% 96% 97%  Weight:   88.6 kg   Height:        Intake/Output Summary (Last 24 hours) at 03/14/2021 0959 Last data filed at 03/14/2021 0827 Gross per 24 hour  Intake 720 ml  Output 700 ml  Net 20 ml   Last 3 Weights 03/14/2021 03/13/2021 03/12/2021  Weight (lbs) 195 lb 5.2 oz 190 lb 11.2 oz 192 lb 3.9 oz  Weight (kg) 88.6 kg 86.5 kg 87.2 kg      Telemetry    Rapid Afib >> SR, 8:19 pm on 06/01 - Personally Reviewed  Physical Exam   General: Well developed, well nourished, female in no acute distress Head: Eyes PERRLA, Head normocephalic and atraumatic Lungs: clear bilaterally to auscultation. Heart: HRRR S1 S2, without rub or gallop. No murmur. 4/4 extremity pulses are 2+ & equal. No JVD. Abdomen: Bowel sounds  are present, abdomen soft and non-tender without masses or  hernias noted. Msk: Normal strength and tone for age. Extremities: No clubbing, cyanosis or edema. L foot bandaged, not disturbed Skin:  No rashes or lesions noted. Neuro: Alert and oriented X 3. Psych:  Good affect, responds appropriately   Chemistry Recent Labs  Lab 03/11/21 0230 03/12/21 0353 03/13/21 0332  NA 137 135 136  K 4.3 4.2 4.0  CL 106 102 104  CO2 23 23 21*  GLUCOSE 113* 106* 97  BUN 21 17 14   CREATININE 0.88 0.93 0.88  CALCIUM 8.5* 8.7* 8.7*  PROT 4.6* 5.2* 5.0*  ALBUMIN 2.5* 2.6* 2.5*  AST 17 17 15   ALT 23 25 18   ALKPHOS 46 42 36*  BILITOT 0.3 0.4 0.9  GFRNONAA >60 >60 >60  ANIONGAP 8 10 11      Hematology Recent Labs  Lab 03/11/21 0230 03/12/21 0353 03/13/21 0332  WBC 7.0 9.6 8.5  RBC 3.68* 3.89 3.92  HGB 10.5* 11.3* 11.4*  HCT 31.6* 33.3* 33.8*  MCV 85.9 85.6 86.2  MCH 28.5 29.0 29.1  MCHC 33.2 33.9 33.7  RDW 15.6* 15.5 15.5  PLT 173 258 245  BNP Recent Labs  Lab 03/08/21 1043  BNP 309.0*    Patient Profile     80 yo female with history of HTN, asthma, TIA, OSA on CPAP,obesity, chronic back pain, who presented to the ER with c/o weakness, nausea, vomiting, heart palpitation, and nothing to eat and drink for 3 days, found to have sepsis with lactic acidosis >2 2/2 presumed toe blister with infection, AKI, and A. fib with RVR. Cardiology is consulted and  following for afib.  Echocardiogram shows normal LV function, mild left ventricular hypertrophy, mild to moderate left atrial enlargement.  Assessment & Plan    1 newly diagnosed atrial fibrillation- - s/p spontaneous conversion to SR - Cont amio 400 mg bid day 3/7, then 200 mg qd - on Dilt 60 mg q 6 hr no doses missed >> will change to 240 mg qd - cont metop 50 mg bid - BP and HR are tolerating both BB/CCB  - Afib felt 2nd stress of acute illness - cont Eliquis  2 sepsis- -improving, mgt per IM.  3 acute renal  insufficiency -- Cr normal now - per IM  4 hypertension- - SBP 130s-150s last 24 hr - losartan 25 mg qd to start today - follow on this, up-titrate prn  For questions or updates, please contact [facility] Please consult [email or web address] for contact info under   Signed, Rosaria Ferries, PA-C  03/14/2021, 9:59 AM   As above, pt seen and examined; no CP or dyspnea; pt has converted to sinus; change cardizem to CD; given nausea with decrease amiodarone to 200 mg daily; continue metoprolol and apixaban. BP increasing since sepsis has resolved; losartan initiated; advance as needed; will arrange FU 4-6 weeks following DC with APP; FU Dr Margaretann Loveless 3 months. Cardiology will sign off; please call with questions. Kirk Ruths, MD

## 2021-03-14 NOTE — Evaluation (Signed)
Occupational Therapy Evaluation Patient Details Name: Alisha Terrell MRN: [id number] DOB: November 24, 1940 Today's Date: 03/14/2021    History of Present Illness Pt is an 80 y/o female admitted 5/25 secondary to new onset a fib and sepsis secondary to L foot cellulitis. PMH includes asthma, HTN, and OSA on CPAP.   Clinical Impression   PTA patient reports independent with ADls, mobility and IADLs. Admitted for above and presenting with problem list below, including decreased activity tolerance, generalized weakness and impaired balance. She completes transfers with min guard, in room mobility with min guard using RW, and ADLs with up to min assist.  She fatigues easily with minimal activity.  She reports her daughter can provide increased support at home as needed.  Believe she will benefit from further OT services acutely and after dc at [facility] level to optimize independence and safety with ADLs.     Follow Up Recommendations  Home health OT;Supervision - Intermittent    Equipment Recommendations  None recommended by OT    Recommendations for Other Services       Precautions / Restrictions Precautions Precautions: Fall Restrictions Weight Bearing Restrictions: No      Mobility Bed Mobility               General bed mobility comments: In chair upon entry    Transfers Overall transfer level: Needs assistance Equipment used: Rolling walker (2 wheeled) Transfers: Sit to/from Stand Sit to Stand: Min guard         General transfer comment: Min guard for safety. Cues for hand placement.    Balance Overall balance assessment: Needs assistance Sitting-balance support: No upper extremity supported;Feet supported Sitting balance-Leahy Scale: Fair     Standing balance support: Bilateral upper extremity supported;During functional activity Standing balance-Leahy Scale: Poor Standing balance comment: Reliant on BUE support                           ADL either  performed or assessed with clinical judgement   ADL Overall ADL's : Needs assistance/impaired     Grooming: Set up;Sitting   Upper Body Bathing: Set up;Sitting   Lower Body Bathing: Minimal assistance;Sit to/from stand   Upper Body Dressing : Set up;Sitting   Lower Body Dressing: Minimal assistance Lower Body Dressing Details (indicate cue type and reason): assist for L shoe Toilet Transfer: Min guard;Ambulation;RW           Functional mobility during ADLs: Min guard;Rolling walker General ADL Comments: pt limited by decreased activity tolerance and endurance     Vision   Vision Assessment?: No apparent visual deficits     Perception     Praxis      Pertinent Vitals/Pain Pain Assessment: Faces Faces Pain Scale: Hurts a little bit Pain Location: arthrtic pain chronic (neck, back, hands) Pain Descriptors / Indicators: Discomfort Pain Intervention(s): Limited activity within patient's tolerance;Monitored during session;Repositioned     Hand Dominance Right   Extremity/Trunk Assessment Upper Extremity Assessment Upper Extremity Assessment: Generalized weakness   Lower Extremity Assessment Lower Extremity Assessment: Defer to PT evaluation   Cervical / Trunk Assessment Cervical / Trunk Assessment: Normal   Communication Communication Communication: No difficulties   Cognition Arousal/Alertness: Awake/alert Behavior During Therapy: WFL for tasks assessed/performed Overall Cognitive Status: Within Functional Limits for tasks assessed  General Comments  VSS during session    Exercises     Shoulder Instructions      Home Living Family/patient expects to be discharged to:: Private residence Living Arrangements: Spouse/significant other;Children Available Help at Discharge: Family Type of Home: House Home Access: Stairs to enter Technical brewer of Steps: 1 Entrance Stairs-Rails: Right Home  Layout: Other (Comment) (has a step to get into laundry room)     Bathroom Shower/Tub: Hospital doctor Toilet: Handicapped height     Home Equipment: [place] - single point;Shower seat;Grab bars - tub/shower   Additional Comments: 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      Prior Functioning/Environment Level of Independence: Independent with assistive device(s)        Comments: Normally using cane for ambulation, independent IADls (has cleaning assist every other week); indepedent ADLs, driving        OT Problem List: Decreased strength;Decreased activity tolerance;Impaired balance (sitting and/or standing);Decreased safety awareness;Decreased knowledge of use of DME or AE;Decreased knowledge of precautions;Obesity      OT Treatment/Interventions: Self-care/ADL training;DME and/or AE instruction;Therapeutic activities;Patient/family education;Balance training;Energy conservation    OT Goals(Current goals can be found in the care plan section) Acute Rehab OT Goals Patient Stated Goal: to get stronger and go home OT Goal Formulation: With patient Time For Goal Achievement: 03/28/21 Potential to Achieve Goals: Good  OT Frequency: Min 2X/week   Barriers to D/C:            Co-evaluation              AM-PAC OT "6 Clicks" Daily Activity     Outcome Measure Help from another person eating meals?: None Help from another person taking care of personal grooming?: A Little Help from another person toileting, which includes using toliet, bedpan, or urinal?: A Little Help from another person bathing (including washing, rinsing, drying)?: A Little Help from another person to put on and taking off regular upper body clothing?: A Little Help from another person to put on and taking off regular lower body clothing?: A Little 6 Click Score: 19   End of Session Equipment Utilized During Treatment: Rolling walker Nurse Communication: Mobility status  Activity Tolerance:  Patient tolerated treatment well Patient left: in chair;with call bell/phone within reach  OT Visit Diagnosis: Other abnormalities of gait and mobility (R26.89);Muscle weakness (generalized) (M62.81)                Time: 5361-4431 OT Time Calculation (min): 25 min Charges:  OT General Charges $OT Visit: 1 Visit OT Evaluation $OT Eval Moderate Complexity: 1 Mod OT Treatments $Self Care/Home Management : 8-22 mins  Jolaine Artist, OT Acute Rehabilitation Services Pager [phone] Office [phone]   Delight Stare 03/14/2021, 10:20 AM

## 2021-03-15 ENCOUNTER — Other Ambulatory Visit (HOSPITAL_COMMUNITY): Payer: Self-pay

## 2021-03-15 DIAGNOSIS — G4733 Obstructive sleep apnea (adult) (pediatric): Secondary | ICD-10-CM

## 2021-03-15 MED ORDER — APIXABAN 5 MG PO TABS
5.0000 mg | ORAL_TABLET | Freq: Two times a day (BID) | ORAL | 0 refills | Status: DC
  Filled 2021-03-15: qty 60, 30d supply, fill #0

## 2021-03-15 MED ORDER — AMIODARONE HCL 200 MG PO TABS
200.0000 mg | ORAL_TABLET | Freq: Every day | ORAL | 3 refills | Status: DC
  Filled 2021-03-15: qty 30, 30d supply, fill #0

## 2021-03-15 MED ORDER — METOPROLOL TARTRATE 50 MG PO TABS
50.0000 mg | ORAL_TABLET | Freq: Two times a day (BID) | ORAL | 3 refills | Status: DC
  Filled 2021-03-15: qty 60, 30d supply, fill #0

## 2021-03-15 MED ORDER — DILTIAZEM HCL ER COATED BEADS 240 MG PO CP24
240.0000 mg | ORAL_CAPSULE | Freq: Every day | ORAL | 3 refills | Status: DC
  Filled 2021-03-15: qty 30, 30d supply, fill #0

## 2021-03-15 MED ORDER — DOXYCYCLINE HYCLATE 100 MG PO TABS
100.0000 mg | ORAL_TABLET | Freq: Two times a day (BID) | ORAL | 0 refills | Status: DC
  Filled 2021-03-15: qty 14, 7d supply, fill #0

## 2021-03-15 NOTE — Plan of Care (Signed)
  Problem: Increased Nutrient Needs (NI-5.1) Goal: Food and/or nutrient delivery Description: Individualized approach for food/nutrient provision. Outcome: Adequate for Discharge   Problem: Acute Rehab PT Goals(only PT should resolve) Goal: Pt Will Go Supine/Side To Sit Outcome: Adequate for Discharge Goal: Pt Will Go Sit To Supine/Side Outcome: Adequate for Discharge Goal: Patient Will Transfer Sit To/From Stand Outcome: Adequate for Discharge Goal: Pt Will Ambulate Outcome: Adequate for Discharge   Problem: Acute Rehab OT Goals (only OT should resolve) Goal: Pt. Will Perform Grooming Outcome: Adequate for Discharge Goal: Pt. Will Perform Lower Body Dressing Outcome: Adequate for Discharge Goal: Pt. Will Transfer To Toilet Outcome: Adequate for Discharge Goal: Pt. Will Perform Toileting-Clothing Manipulation Outcome: Adequate for Discharge Goal: OT Additional ADL Goal #1 Outcome: Adequate for Discharge

## 2021-03-15 NOTE — Progress Notes (Signed)
RN changed pt's foot dressing.

## 2021-03-15 NOTE — TOC Initial Note (Addendum)
Transition of Care [facility]) - Initial/Assessment Note    Patient Details  Name: Alisha Terrell MRN: [id number] Date of Birth: July 16, 1941  Transition of Care [facility]) CM/SW Contact:    Bethann Berkshire, [place] Phone Number: 03/15/2021, 10:31 AM  Clinical Narrative:                  CSW met with pt to discuss disposition. Pt confirms plan to go home with [facility]. States she lives with her husband. She has a daughter locally who is agreeable to assist at home. She also has a sister who is local. Pt has a rolling walker at home. She states she does not need a bedside commode as her bathroom is about 6 feet from her bed and she has something to hold on to to get there. Pt is agreeable to [facility] and has no preference for agency. She states her daughter will transport her home.   CSW made HHPT/OT referral to [facility].  Rollator arranged through adapt.   1130: CSW met with pt son bedside and provided update on dc planning regarding [facility] and DME  1314: Levada Dy with [facility] confirmed they can accept pt with start of care 03/17/21   Barriers to Discharge: No Barriers Identified   Patient Goals and CMS Choice Patient states their goals for this hospitalization and ongoing recovery are:: Return home with Home health   Choice offered to / list presented to : Patient  Expected Discharge Plan and Services         Living arrangements for the past 2 months: Single Family Home Expected Discharge Date: 03/15/21                         [facility] Arranged: PT,OT   Date HH Agency Contacted: 03/15/21   Representative spoke with at [place]: Mauri Pole  Prior Living Arrangements/Services Living arrangements for the past 2 months: [place] Lives with:: Spouse   Do you feel safe going back to the place where you live?: Yes      Need for Family Participation in Patient Care: Yes (Comment) Care giver support system in place?: Yes (comment) Current home services: DME    Activities of Daily  Living Home Assistive Devices/Equipment: Gilford Rile (specify type) ADL Screening (condition at time of admission) Patient's cognitive ability adequate to safely complete daily activities?: No Is the patient deaf or have difficulty hearing?: No Does the patient have difficulty seeing, even when wearing glasses/contacts?: No Does the patient have difficulty concentrating, remembering, or making decisions?: No Patient able to express need for assistance with ADLs?: Yes Does the patient have difficulty dressing or bathing?: No Independently performs ADLs?: Yes (appropriate for developmental age) Does the patient have difficulty walking or climbing stairs?: No Weakness of Legs: Both Weakness of Arms/Hands: None  Permission Sought/Granted                  Emotional Assessment Appearance:: Appears stated age Attitude/Demeanor/Rapport: Engaged Affect (typically observed): Accepting Orientation: : Oriented to Self,Oriented to Place,Oriented to  Time,Oriented to Situation Alcohol / Substance Use: Not Applicable Psych Involvement: No (comment)  Admission diagnosis:  Tachycardia [R00.0] New onset atrial fibrillation ([place]) [I48.91] AKI (acute kidney injury) ([place]) [N17.9] Toe ulcer, left, limited to breakdown of skin ([place]) [L97.521] Severe sepsis ([place]) [A41.9, R65.20] Patient Active Problem List   Diagnosis Date Noted  . New onset atrial fibrillation ([place]) 03/06/2021  . AKI (acute kidney injury) ([place])   .  Severe sepsis ([place])   . Toe ulcer, left, limited to breakdown of skin ([place])   . Age-related osteoporosis without current pathological fracture 08/11/2018  . Thyroid nodule 08/11/2018  . Localized, primary osteoarthritis of hand 06/17/2018  . Bilateral hand pain 06/01/2018  . Encounter for Medicare annual wellness exam 07/29/2017  . Chronic obstructive asthma ([place]) 03/03/2016  . Allergic rhinitis 11/14/2015  . Arthritis involving multiple sites 11/14/2015  . Essential hypertension  11/14/2015  . GERD (gastroesophageal reflux disease) 11/14/2015  . Low back pain 11/14/2015  . Neck pain 11/14/2015  . Obesity 11/14/2015  . OSA (obstructive sleep apnea) 11/14/2015  . Postmenopausal estrogen deficiency 11/14/2015  . Situational insomnia 11/14/2015  . 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 TIA (transient ischemic attack) 11/13/2015   PCP:  Christain Sacramento, MD Pharmacy:   [facility] [address] [place] [address] Phone: [phone] Fax: [phone]  [facility] [address] [place] [address] Phone: [phone] Fax: [phone]     Social Determinants of Health (SDOH) Interventions    Readmission Risk Interventions No flowsheet data found.

## 2021-03-15 NOTE — Progress Notes (Signed)
Physical Therapy Treatment Patient Details Name: Alisha Terrell MRN: [id number] DOB: 10-09-1941 Today's Date: 03/15/2021    History of Present Illness Pt is an 80 y/o female admitted 5/25 secondary to new onset a fib and sepsis secondary to L foot cellulitis. PMH includes asthma, HTN, and OSA on CPAP.    PT Comments    Pt demonstrates ability to perform transfers and ambulate, without requiring physical assistance. Pt tolerates gait of increased distances with rollator and 2 seated rest breaks, compared to last session. Pt initially needing cueing for brakes during transfers, but able to demonstrate carryover by end of session needing increased time. Pt reports an improvement in her WOB and ability to ambulate longer distances. Pt demonstrates deficits in overall strength, endurance, power, and activity tolerance and will benefit from acute PT to improve safe and independent mobility.   Follow Up Recommendations  Home health PT     Equipment Recommendations  Other (comment) (rollator)    Recommendations for Other Services       Precautions / Restrictions Precautions Precautions: Fall Restrictions Weight Bearing Restrictions: No    Mobility  Bed Mobility               General bed mobility comments: In chair upon entry    Transfers Overall transfer level: Needs assistance Equipment used: 4-wheeled walker Transfers: Sit to/from Stand Sit to Stand: Min guard         General transfer comment: min G for safety and cues for managing rollator/brakes.  Ambulation/Gait Ambulation/Gait assistance: Min guard Gait Distance (Feet): 170 Feet Assistive device: 4-wheeled walker Gait Pattern/deviations: Step-through pattern;Decreased weight shift to left;Antalgic Gait velocity: Decreased Gait velocity interpretation: 1.31 - 2.62 ft/sec, indicative of limited community ambulator General Gait Details: VSS on RA. Pt ambulates with rollator, demonstrating improved WOB and  requests one seated rest break. Pt asked to demonstrate one additional seated rest break for educational purposes.   Stairs             Wheelchair Mobility    Modified Rankin (Stroke Patients Only)       Balance Overall balance assessment: Needs assistance Sitting-balance support: No upper extremity supported;Feet supported Sitting balance-Leahy Scale: Fair     Standing balance support: Bilateral upper extremity supported;No upper extremity supported;During functional activity Standing balance-Leahy Scale: Fair Standing balance comment: Pt able to don face mask without relying upon UE support.                            Cognition Arousal/Alertness: Awake/alert Behavior During Therapy: WFL for tasks assessed/performed Overall Cognitive Status: Within Functional Limits for tasks assessed                                        Exercises      General Comments General comments (skin integrity, edema, etc.): VSS on RA for duration of session.      Pertinent Vitals/Pain Pain Assessment: No/denies pain    Home Living                      Prior Function            PT Goals (current goals can now be found in the care plan section) Acute Rehab PT Goals Patient Stated Goal: to get stronger and go home Progress towards PT goals: Progressing toward goals  Frequency    Min 3X/week      PT Plan Current plan remains appropriate    Co-evaluation              AM-PAC PT "6 Clicks" Mobility   Outcome Measure  Help needed turning from your back to your side while in a flat bed without using bedrails?: A Little Help needed moving from lying on your back to sitting on the side of a flat bed without using bedrails?: A Little Help needed moving to and from a bed to a chair (including a wheelchair)?: A Little Help needed standing up from a chair using your arms (e.g., wheelchair or bedside chair)?: A Little Help needed to  walk in hospital room?: A Little Help needed climbing 3-5 steps with a railing? : A Lot 6 Click Score: 17    End of Session Equipment Utilized During Treatment: Gait belt Activity Tolerance: Patient tolerated treatment well Patient left: in chair;with call bell/phone within reach Nurse Communication: Mobility status PT Visit Diagnosis: Unsteadiness on feet (R26.81);Muscle weakness (generalized) (M62.81);Difficulty in walking, not elsewhere classified (R26.2)     Time: 1224-4975 PT Time Calculation (min) (ACUTE ONLY): 20 min  Charges:  $Gait Training: 8-22 mins                     Acute Rehab  Pager: [phone]    Garwin Brothers, SPT  03/15/2021, 1:45 PM

## 2021-03-15 NOTE — Plan of Care (Signed)

## 2021-03-15 NOTE — Discharge Summary (Signed)
Physician Discharge Summary  PALLAVI CLIFTON WFU:[id number] DOB: 11-19-1940 DOA: 03/06/2021  PCP: Christain Sacramento, MD  Admit date: 03/06/2021 Discharge date: 03/15/2021  Admitted From: Home Disposition:  Home  Recommendations for Outpatient Follow-up:  1. Follow up with CArdiology in 1-2 weeks 2. Please obtain BMP/CBC in one week   Home Health:Yes Equipment/Devices:None  Discharge Condition:Stable CODE STATUS:Full Diet recommendation: Heart Healthy   Brief/Interim Summary: 80 y.o. female past medical history of asthma, essential hypertension TIA obstructive sleep apnea on CPAP chronic back pain comes to the ED complaining of weakness nausea vomiting heart palpitations and decreased oral intake found to be septic probable source due to blisters infection, also found to be in acute kidney injury and A. fib with RVR and cardiology was consulted  Discharge Diagnoses:  Principal Problem:   New onset atrial fibrillation ([place]) Active Problems:   Essential hypertension   Obesity   OSA (obstructive sleep apnea)   AKI (acute kidney injury) ([place])   Severe sepsis (H[facility])   Toe ulcer, left, limited to breakdown of skin ([place]) Severe sepsis secondary to left foot cellulitis: She was started empirically on antibiotics podiatry was consulted recommended to continue antibiotics post discharge she will go home on doxycycline for 1 week after discharge. Physical therapy evaluated the patient and recommended home health PT.  New onset atrial fibrillation: Cardiology was consulted she was started on amiodarone metoprolol and diltiazem and her heart rate was controlled. She also started on Eliquis, she will need a TEE cardioversion as an outpatient in sinus rhythm.  Essential hypertension: On amlodipine metoprolol and Lasix as well as diltiazem blood pressure slightly elevated will need to be evaluated as an outpatient and titrate medications as needed.  Acute kidney injury: In setting of sepsis  now resolved.  Obstructive sleep apnea: Continue CPAP at night.   Discharge Instructions  Discharge Instructions    Amb referral to AFIB Clinic   Complete by: As directed    Diet - low sodium heart healthy   Complete by: As directed    Increase activity slowly   Complete by: As directed    No wound care   Complete by: As directed      Allergies as of 03/15/2021      Reactions   Codeine Rash, Other (See Comments)   Agitation, bad dreams   Omnicef [cefdinir] Nausea And Vomiting   Confirmed with patient at bedside that no rash - just had significant GI effects      Medication List    STOP taking these medications   amLODipine 5 MG tablet Commonly known as: NORVASC   atenolol 50 MG tablet Commonly known as: TENORMIN   CALCIUM-VITAMIN D PO     TAKE these medications   Advair Diskus 250-50 MCG/ACT Aepb Generic drug: fluticasone-salmeterol Inhale 1 puff into the lungs 2 (two) times daily.   amiodarone 200 MG tablet Commonly known as: PACERONE Take 1 tablet (200 mg total) by mouth daily.   apixaban 5 MG Tabs tablet Commonly known as: ELIQUIS Take 1 tablet (5 mg total) by mouth 2 (two) times daily.   aspirin EC 81 MG tablet Take 81 mg by mouth daily.   CENTRUM SILVER ADULT 50+ PO Take 1 tablet by mouth daily.   diclofenac Sodium 1 % Gel Commonly known as: VOLTAREN Apply 2 g topically at bedtime.   diltiazem 240 MG 24 hr capsule Commonly known as: CARDIZEM CD Take 1 capsule (240 mg total) by mouth daily.   doxycycline 100  MG tablet Commonly known as: VIBRA-TABS Take 1 tablet (100 mg total) by mouth every 12 (twelve) hours.   fluticasone 50 MCG/ACT nasal spray Commonly known as: FLONASE Place 1 spray into both nostrils at bedtime.   furosemide 20 MG tablet Commonly known as: LASIX Take 20 mg by mouth daily as needed for fluid.   ibandronate 150 MG tablet Commonly known as: BONIVA Take 150 mg by mouth every 30 (thirty) days.   losartan 100 MG  tablet Commonly known as: COZAAR Take 100 mg by mouth daily.   metoprolol tartrate 50 MG tablet Commonly known as: LOPRESSOR Take 1 tablet (50 mg total) by mouth 2 (two) times daily.   montelukast 10 MG tablet Commonly known as: SINGULAIR Take 10 mg by mouth at bedtime.   omeprazole 40 MG capsule Commonly known as: PRILOSEC Take 40 mg by mouth in the morning and at bedtime.   ProAir HFA 108 (90 Base) MCG/ACT inhaler Generic drug: albuterol Inhale 1-2 puffs into the lungs every 6 (six) hours as needed for wheezing or shortness of breath.   ROBITUSSIN COUGH/CHEST DM MAX PO Take 1 mL by mouth 2 (two) times daily as needed (for cold symptoms).   traMADol 50 MG tablet Commonly known as: ULTRAM Take 50 mg by mouth every 6 (six) hours as needed for moderate pain or severe pain.       Allergies  Allergen Reactions  . Codeine Rash and Other (See Comments)    Agitation, bad dreams  . Omnicef [Cefdinir] Nausea And Vomiting    Confirmed with patient at bedside that no rash - just had significant GI effects    Consultations:  CArdiology   Procedures/Studies: DG CHEST PORT 1 VIEW  Result Date: 03/08/2021 CLINICAL DATA:  Pneumonia EXAM: PORTABLE CHEST 1 VIEW COMPARISON:  Two days ago and 06/13/2020 FINDINGS: Low volume chest with streaky density at the bases. Borderline heart size with stable aortic tortuosity. Artifact from EKG leads. No effusion or pneumothorax. Calcification over the right upper quadrant. IMPRESSION: Stable chest when compared to 2021.  No acute finding. Electronically Signed   By: Monte Fantasia M.D.   On: 03/08/2021 09:51   DG Chest Port 1 View  Result Date: 03/06/2021 CLINICAL DATA:  Atrial fibrillation EXAM: PORTABLE CHEST 1 VIEW COMPARISON:  06/13/2020 FINDINGS: Low lung volumes. Probable central bronchiectasis. Mild interstitial prominence. No significant pleural effusion. No pneumothorax. Cardiomediastinal contours are likely within normal limits for  technique. IMPRESSION: Mild interstitial prominence, which may reflect chronic changes or edema. Electronically Signed   By: Macy Mis M.D.   On: 03/06/2021 12:53   DG Foot Complete Left  Result Date: 03/06/2021 CLINICAL DATA:  Left foot ulcer and pain. EXAM: LEFT FOOT - COMPLETE 3+ VIEW COMPARISON:  August 29, 2019. FINDINGS: There is no evidence of fracture or dislocation. There is no evidence of arthropathy or other focal bone abnormality. Soft tissues are unremarkable. IMPRESSION: Negative. Electronically Signed   By: Marijo Conception M.D.   On: 03/06/2021 18:53   VAS [place] ABI WITH/WO TBI  Result Date: 03/09/2021  LOWER EXTREMITY DOPPLER STUDY Patient Name:  JANDI SWIGER  Date of Exam:   03/08/2021 Medical Rec #: [id number]           Accession #:    [id number] Date of Birth: 09/01/1941           Patient Gender: F Patient Age:   79Y Exam Location:  [facility] Procedure:      VAS  [place] ABI WITH/WO TBI Referring Phys: [id number] JENNIFER YATES --------------------------------------------------------------------------------  Indications: Ulceration. High Risk Factors: Hypertension, no history of smoking. Other Factors: TIA, Afib.  Comparison Study: No previous exams Performing Technologist: Hill, Jody RVT, RDMS  Examination Guidelines: A complete evaluation includes at minimum, Doppler waveform signals and systolic blood pressure reading at the level of bilateral brachial, anterior tibial, and posterior tibial arteries, when vessel segments are accessible. Bilateral testing is considered an integral part of a complete examination. Photoelectric Plethysmograph (PPG) waveforms and toe systolic pressure readings are included as required and additional duplex testing as needed. Limited examinations for reoccurring indications may be performed as noted.  ABI Findings: +---------+------------------+-----+---------+--------+ Right    Rt Pressure (mmHg)IndexWaveform Comment   +---------+------------------+-----+---------+--------+ Brachial 113                    triphasic         +---------+------------------+-----+---------+--------+ PTA      108               0.96 triphasic         +---------+------------------+-----+---------+--------+ DP       106               0.94 triphasic         +---------+------------------+-----+---------+--------+ Great Toe101               0.89 Normal            +---------+------------------+-----+---------+--------+ +---------+------------------+-----+----------------+-------+ Left     Lt Pressure (mmHg)IndexWaveform        Comment +---------+------------------+-----+----------------+-------+ Brachial                        triphasic       IV      +---------+------------------+-----+----------------+-------+ PTA      127               1.12 biphasic                +---------+------------------+-----+----------------+-------+ DP       104               0.92 Audibly biphasic        +---------+------------------+-----+----------------+-------+ Great Toe49                0.43 Abnormal                +---------+------------------+-----+----------------+-------+ +-------+-----------+-----------+------------+------------+ ABI/TBIToday's ABIToday's TBIPrevious ABIPrevious TBI +-------+-----------+-----------+------------+------------+ Right  0.96       0.89                                +-------+-----------+-----------+------------+------------+ Left   1.12       0.43                                +-------+-----------+-----------+------------+------------+  Doppler waveforms difficult to obtain due to patient being in constant Afib .  Summary: Right: Resting right ankle-brachial index is within normal range. No evidence of significant right lower extremity arterial disease. The right toe-brachial index is normal. Left: Resting left ankle-brachial index is within normal range. No evidence of  significant left lower extremity arterial disease. The left toe-brachial index is abnormal.  *See table(s) above for measurements and observations.  Electronically signed by Monica Martinez MD on 03/09/2021 at 11:00:02 AM.    Final    ECHOCARDIOGRAM  COMPLETE  Result Date: 03/08/2021    ECHOCARDIOGRAM REPORT   Patient Name:   MELENIE MINNIEAR Date of Exam: 03/08/2021 Medical Rec #:  [id number]          Height:       59.0 in Accession #:    [id number]         Weight:       192.2 lb Date of Birth:  [birth year]          BSA:          1.814 m Patient Age:    75 years           BP:           94/77 mmHg Patient Gender: F                  HR:           125 bpm. Exam Location:  Inpatient Procedure: 2D Echo, Color Doppler and Cardiac Doppler Indications:    Atrial fibrillation  History:        Patient has prior history of Echocardiogram examinations, most                 recent 08/17/2020. Arrythmias:Atrial Fibrillation; Risk                 Factors:Hypertension.  Sonographer:    [place] Referring Phys: [id number] Margie Billet  Sonographer Comments: Patient is morbidly obese. Image acquisition challenging due to patient body habitus. IMPRESSIONS  1. Left ventricular ejection fraction, by estimation, is 55 to 60%. The left ventricle has normal function. The left ventricle has no regional wall motion abnormalities. There is mild concentric left ventricular hypertrophy. Left ventricular diastolic function could not be evaluated.  2. Right ventricular systolic function is normal. The right ventricular size is normal. There is mildly elevated pulmonary artery systolic pressure. The estimated right ventricular systolic pressure is 83.4 mmHg.  3. Left atrial size was mild to moderately dilated.  4. The mitral valve is grossly normal. Trivial mitral valve regurgitation. No evidence of mitral stenosis.  5. The aortic valve is tricuspid. Aortic valve regurgitation is not visualized. No aortic stenosis is present.  6. The  inferior vena cava is dilated in size with <50% respiratory variability, suggesting right atrial pressure of 15 mmHg. Comparison(s): Changes from prior study are noted. Afib is now present. FINDINGS  Left Ventricle: Left ventricular ejection fraction, by estimation, is 55 to 60%. The left ventricle has normal function. The left ventricle has no regional wall motion abnormalities. The left ventricular internal cavity size was normal in size. There is  mild concentric left ventricular hypertrophy. Left ventricular diastolic function could not be evaluated due to atrial fibrillation. Left ventricular diastolic function could not be evaluated. Right Ventricle: The right ventricular size is normal. No increase in right ventricular wall thickness. Right ventricular systolic function is normal. There is mildly elevated pulmonary artery systolic pressure. The tricuspid regurgitant velocity is 2.65  m/s, and with an assumed right atrial pressure of 15 mmHg, the estimated right ventricular systolic pressure is 19.6 mmHg. Left Atrium: Left atrial size was mild to moderately dilated. Right Atrium: Right atrial size was normal in size. Pericardium: Trivial pericardial effusion is present. Mitral Valve: The mitral valve is grossly normal. Trivial mitral valve regurgitation. No evidence of mitral valve stenosis. Tricuspid Valve: The tricuspid valve is grossly normal. Tricuspid valve regurgitation is trivial. No evidence of tricuspid stenosis. Aortic Valve: The aortic valve  is tricuspid. Aortic valve regurgitation is not visualized. No aortic stenosis is present. Aortic valve mean gradient measures 6.0 mmHg. Aortic valve peak gradient measures 9.1 mmHg. Aortic valve area, by VTI measures 1.97 cm. Pulmonic Valve: The pulmonic valve was grossly normal. Pulmonic valve regurgitation is not visualized. No evidence of pulmonic stenosis. Aorta: The aortic root and ascending aorta are structurally normal, with no evidence of dilitation.  Venous: The inferior vena cava is dilated in size with less than 50% respiratory variability, suggesting right atrial pressure of 15 mmHg. IAS/Shunts: The atrial septum is grossly normal.  LEFT VENTRICLE PLAX 2D LVIDd:         4.70 cm     Diastology LV PW:         1.00 cm     LV e' medial:    6.85 cm/s LV IVS:        1.20 cm     LV E/e' medial:  16.4 LVOT diam:     2.00 cm     LV e' lateral:   13.10 cm/s LV SV:         45          LV E/e' lateral: 8.5 LV SV Index:   25 LVOT Area:     3.14 cm  LV Volumes (MOD) LV vol d, MOD A2C: 35.1 ml LV vol d, MOD A4C: 51.2 ml LV vol s, MOD A2C: 16.7 ml LV vol s, MOD A4C: 27.2 ml LV SV MOD A2C:     18.4 ml LV SV MOD A4C:     51.2 ml LV SV MOD BP:      21.7 ml RIGHT VENTRICLE RV Basal diam:  3.40 cm RV Mid diam:    3.20 cm RV S prime:     15.70 cm/s TAPSE (M-mode): 1.8 cm LEFT ATRIUM             Index       RIGHT ATRIUM           Index LA diam:        4.20 cm 2.32 cm/m  RA Area:     15.90 cm LA Vol (A2C):   88.9 ml 49.02 ml/m RA Volume:   41.50 ml  22.88 ml/m LA Vol (A4C):   71.9 ml 39.64 ml/m LA Biplane Vol: 80.9 ml 44.61 ml/m  AORTIC VALVE                    PULMONIC VALVE AV Area (Vmax):    2.16 cm     PV Vmax:       0.95 m/s AV Area (Vmean):   1.73 cm     PV Vmean:      67.900 cm/s AV Area (VTI):     1.97 cm     PV VTI:        0.151 m AV Vmax:           151.00 cm/s  PV Peak grad:  3.6 mmHg AV Vmean:          122.000 cm/s PV Mean grad:  2.0 mmHg AV VTI:            0.228 m AV Peak Grad:      9.1 mmHg AV Mean Grad:      6.0 mmHg LVOT Vmax:         104.00 cm/s LVOT Vmean:        67.300 cm/s LVOT VTI:  0.143 m LVOT/AV VTI ratio: 0.63  AORTA Ao Root diam: 3.10 cm Ao Asc diam:  3.40 cm MV E velocity: 112.00 cm/s  TRICUSPID VALVE                             TR Peak grad:   28.1 mmHg                             TR Vmax:        265.00 cm/s                              SHUNTS                             Systemic VTI:  0.14 m                             Systemic Diam:  2.00 cm Eleonore Chiquito MD Electronically signed by Eleonore Chiquito MD Signature Date/Time: 03/08/2021/4:51:33 PM    Final      Subjective: No new complaints.  Discharge Exam: Vitals:   03/15/21 0825 03/15/21 0832  BP: (!) 166/72 (!) 166/72  Pulse: 69 77  Resp: 18 17  Temp: 97.7 F (36.5 C)   SpO2: 95% 96%   Vitals:   03/15/21 0012 03/15/21 0409 03/15/21 0825 03/15/21 0832  BP: (!) 151/71 (!) 142/76 (!) 166/72 (!) 166/72  Pulse: 64 (!) 58 69 77  Resp: 19 18 18 17   Temp: 98.5 F (36.9 C) 98.1 F (36.7 C) 97.7 F (36.5 C)   TempSrc: Oral Oral Oral   SpO2: 96% 96% 95% 96%  Weight:  85.3 kg    Height:        General: Pt is alert, awake, not in acute distress Cardiovascular: RRR, S1/S2 +, no rubs, no gallops Respiratory: CTA bilaterally, no wheezing, no rhonchi Abdominal: Soft, NT, ND, bowel sounds + Extremities: no edema, no cyanosis    The results of significant diagnostics from this hospitalization (including imaging, microbiology, ancillary and laboratory) are listed below for reference.     Microbiology: Recent Results (from the past 240 hour(s))  Culture, blood (x 2)     Status: None   Collection Time: 03/06/21 12:01 PM   Specimen: BLOOD RIGHT HAND  Result Value Ref Range Status   Specimen Description BLOOD RIGHT HAND  Final   Special Requests   Final    BOTTLES DRAWN AEROBIC AND ANAEROBIC Blood Culture results may not be optimal due to an inadequate volume of blood received in culture bottles   Culture   Final    NO GROWTH 5 DAYS Performed at [facility], [place] [address] [place], [place] [address]    Report Status 03/11/2021 FINAL  Final  Resp Panel by RT-PCR (Flu A&B, Covid) Nasopharyngeal Swab     Status: None   Collection Time: 03/06/21  2:23 PM   Specimen: Nasopharyngeal Swab; Nasopharyngeal(NP) swabs in vial transport medium  Result Value Ref Range Status   SARS Coronavirus 2 by RT PCR NEGATIVE NEGATIVE Final    Comment: (NOTE) SARS-CoV-2 target  nucleic acids are NOT DETECTED.  The SARS-CoV-2 RNA is generally detectable in upper respiratory specimens during the acute phase of infection. The lowest concentration of  SARS-CoV-2 viral copies this assay can detect is 138 copies/mL. A negative result does not preclude SARS-Cov-2 infection and should not be used as the sole basis for treatment or other patient management decisions. A negative result may occur with  improper specimen collection/handling, submission of specimen other than nasopharyngeal swab, presence of viral mutation(s) within the areas targeted by this assay, and inadequate number of viral copies(<138 copies/mL). A negative result must be combined with clinical observations, patient history, and epidemiological information. The expected result is Negative.  Fact Sheet for Patients:  EntrepreneurPulse.com.au  Fact Sheet for Healthcare Providers:  IncredibleEmployment.be  This test is no t yet approved or cleared by the [place] FDA and  has been authorized for detection and/or diagnosis of SARS-CoV-2 by FDA under an Emergency Use Authorization (EUA). This EUA will remain  in effect (meaning this test can be used) for the duration of the COVID-19 declaration under Section 564(b)(1) of the Act, 21 U.S.C.section 360bbb-3(b)(1), unless the authorization is terminated  or revoked sooner.       Influenza A by PCR NEGATIVE NEGATIVE Final   Influenza B by PCR NEGATIVE NEGATIVE Final    Comment: (NOTE) The Xpert Xpress SARS-CoV-2/FLU/RSV plus assay is intended as an aid in the diagnosis of influenza from Nasopharyngeal swab specimens and should not be used as a sole basis for treatment. Nasal washings and aspirates are unacceptable for Xpert Xpress SARS-CoV-2/FLU/RSV testing.  Fact Sheet for Patients: EntrepreneurPulse.com.au  Fact Sheet for Healthcare  Providers: IncredibleEmployment.be  This test is not yet approved or cleared by the [place] FDA and has been authorized for detection and/or diagnosis of SARS-CoV-2 by FDA under an Emergency Use Authorization (EUA). This EUA will remain in effect (meaning this test can be used) for the duration of the COVID-19 declaration under Section 564(b)(1) of the Act, 21 U.S.C. section 360bbb-3(b)(1), unless the authorization is terminated or revoked.  Performed at [facility], [place] [address], [place], [place] [address]   Culture, blood (x 2)     Status: None   Collection Time: 03/06/21  5:01 PM   Specimen: BLOOD LEFT HAND  Result Value Ref Range Status   Specimen Description BLOOD LEFT HAND  Final   Special Requests   Final    BOTTLES DRAWN AEROBIC AND ANAEROBIC Blood Culture results may not be optimal due to an inadequate volume of blood received in culture bottles   Culture   Final    NO GROWTH 5 DAYS Performed at [facility], [place] [address], [place], [place] [address]    Report Status 03/11/2021 FINAL  Final  Aerobic Culture w Gram Stain (superficial specimen)     Status: None   Collection Time: 03/06/21  5:54 PM   Specimen: Abscess  Result Value Ref Range Status   Specimen Description ABSCESS LEFT TOE  Final   Special Requests NONE  Final   Gram Stain   Final    NO WBC SEEN FEW GRAM POSITIVE COCCI Performed at [facility], [place] [address] [place], [place] [address]    Culture ABUNDANT STAPHYLOCOCCUS AUREUS  Final   Report Status 03/10/2021 FINAL  Final   Organism ID, Bacteria STAPHYLOCOCCUS AUREUS  Final      Susceptibility   Staphylococcus aureus - MIC*    CIPROFLOXACIN <=0.5 SENSITIVE Sensitive     ERYTHROMYCIN <=0.25 SENSITIVE Sensitive     GENTAMICIN <=0.5 SENSITIVE Sensitive     OXACILLIN 2 SENSITIVE Sensitive  TETRACYCLINE <=1 SENSITIVE Sensitive     VANCOMYCIN <=0.5 SENSITIVE Sensitive     TRIMETH/SULFA <=10 SENSITIVE  Sensitive     CLINDAMYCIN <=0.25 SENSITIVE Sensitive     RIFAMPIN <=0.5 SENSITIVE Sensitive     Inducible Clindamycin NEGATIVE Sensitive     * ABUNDANT STAPHYLOCOCCUS AUREUS     Labs: BNP (last 3 results) Recent Labs    03/08/21 1043  BNP 809.9*   Basic Metabolic Panel: Recent Labs  Lab 03/09/21 0232 03/10/21 0552 03/11/21 0230 03/12/21 0353 03/13/21 0332  NA 132* 135 137 135 136  K 4.0 4.0 4.3 4.2 4.0  CL 106 106 106 102 104  CO2 20* 23 23 23  21*  GLUCOSE 112* 108* 113* 106* 97  BUN 41* 24* 21 17 14   CREATININE 1.17* 0.87 0.88 0.93 0.88  CALCIUM 7.8* 8.1* 8.5* 8.7* 8.7*  MG  --  1.9  --   --   --    Liver Function Tests: Recent Labs  Lab 03/09/21 0232 03/10/21 0552 03/11/21 0230 03/12/21 0353 03/13/21 0332  AST 16 17 17 17 15   ALT 21 25 23 25 18   ALKPHOS 42 40 46 42 36*  BILITOT 0.5 0.5 0.3 0.4 0.9  PROT 4.3* 4.6* 4.6* 5.2* 5.0*  ALBUMIN 2.0* 2.2* 2.5* 2.6* 2.5*   No results for input(s): LIPASE, AMYLASE in the last 168 hours. No results for input(s): AMMONIA in the last 168 hours. CBC: Recent Labs  Lab 03/09/21 0232 03/10/21 0552 03/11/21 0230 03/12/21 0353 03/13/21 0332  WBC 8.8 6.2 7.0 9.6 8.5  HGB 10.3* 10.5* 10.5* 11.3* 11.4*  HCT 30.2* 30.7* 31.6* 33.3* 33.8*  MCV 85.3 85.8 85.9 85.6 86.2  PLT 117* 153 173 258 245   Cardiac Enzymes: No results for input(s): CKTOTAL, CKMB, CKMBINDEX, TROPONINI in the last 168 hours. BNP: Invalid input(s): POCBNP CBG: No results for input(s): GLUCAP in the last 168 hours. D-Dimer No results for input(s): DDIMER in the last 72 hours. Hgb A1c No results for input(s): HGBA1C in the last 72 hours. Lipid Profile No results for input(s): CHOL, HDL, LDLCALC, TRIG, CHOLHDL, LDLDIRECT in the last 72 hours. Thyroid function studies No results for input(s): TSH, T4TOTAL, T3FREE, THYROIDAB in the last 72 hours.  Invalid input(s): FREET3 Anemia work up No results for input(s): VITAMINB12, FOLATE, FERRITIN, TIBC,  IRON, RETICCTPCT in the last 72 hours. Urinalysis    Component Value Date/Time   COLORURINE YELLOW 12/14/2015 1909   APPEARANCEUR CLEAR 12/14/2015 1909   LABSPEC 1.004 (L) 12/14/2015 1909   PHURINE 5.0 12/14/2015 1909   GLUCOSEU NEGATIVE 12/14/2015 1909   HGBUR NEGATIVE 12/14/2015 1909   BILIRUBINUR NEGATIVE 12/14/2015 1909   KETONESUR NEGATIVE 12/14/2015 1909   PROTEINUR NEGATIVE 12/14/2015 1909   NITRITE NEGATIVE 12/14/2015 1909   LEUKOCYTESUR SMALL (A) 12/14/2015 1909   Sepsis Labs Invalid input(s): PROCALCITONIN,  WBC,  LACTICIDVEN Microbiology Recent Results (from the past 240 hour(s))  Culture, blood (x 2)     Status: None   Collection Time: 03/06/21 12:01 PM   Specimen: BLOOD RIGHT HAND  Result Value Ref Range Status   Specimen Description BLOOD RIGHT HAND  Final   Special Requests   Final    BOTTLES DRAWN AEROBIC AND ANAEROBIC Blood Culture results may not be optimal due to an inadequate volume of blood received in culture bottles   Culture   Final    NO GROWTH 5 DAYS Performed at [facility], [place] [address] [place], [place] [address]  Report Status 03/11/2021 FINAL  Final  Resp Panel by RT-PCR (Flu A&B, Covid) Nasopharyngeal Swab     Status: None   Collection Time: 03/06/21  2:23 PM   Specimen: Nasopharyngeal Swab; Nasopharyngeal(NP) swabs in vial transport medium  Result Value Ref Range Status   SARS Coronavirus 2 by RT PCR NEGATIVE NEGATIVE Final    Comment: (NOTE) SARS-CoV-2 target nucleic acids are NOT DETECTED.  The SARS-CoV-2 RNA is generally detectable in upper respiratory specimens during the acute phase of infection. The lowest concentration of SARS-CoV-2 viral copies this assay can detect is 138 copies/mL. A negative result does not preclude SARS-Cov-2 infection and should not be used as the sole basis for treatment or other patient management decisions. A negative result may occur with  improper specimen collection/handling, submission of  specimen other than nasopharyngeal swab, presence of viral mutation(s) within the areas targeted by this assay, and inadequate number of viral copies(<138 copies/mL). A negative result must be combined with clinical observations, patient history, and epidemiological information. The expected result is Negative.  Fact Sheet for Patients:  EntrepreneurPulse.com.au  Fact Sheet for Healthcare Providers:  IncredibleEmployment.be  This test is no t yet approved or cleared by the [place] FDA and  has been authorized for detection and/or diagnosis of SARS-CoV-2 by FDA under an Emergency Use Authorization (EUA). This EUA will remain  in effect (meaning this test can be used) for the duration of the COVID-19 declaration under Section 564(b)(1) of the Act, 21 U.S.C.section 360bbb-3(b)(1), unless the authorization is terminated  or revoked sooner.       Influenza A by PCR NEGATIVE NEGATIVE Final   Influenza B by PCR NEGATIVE NEGATIVE Final    Comment: (NOTE) The Xpert Xpress SARS-CoV-2/FLU/RSV plus assay is intended as an aid in the diagnosis of influenza from Nasopharyngeal swab specimens and should not be used as a sole basis for treatment. Nasal washings and aspirates are unacceptable for Xpert Xpress SARS-CoV-2/FLU/RSV testing.  Fact Sheet for Patients: EntrepreneurPulse.com.au  Fact Sheet for Healthcare Providers: IncredibleEmployment.be  This test is not yet approved or cleared by the [place] FDA and has been authorized for detection and/or diagnosis of SARS-CoV-2 by FDA under an Emergency Use Authorization (EUA). This EUA will remain in effect (meaning this test can be used) for the duration of the COVID-19 declaration under Section 564(b)(1) of the Act, 21 U.S.C. section 360bbb-3(b)(1), unless the authorization is terminated or revoked.  Performed at [facility], [place] [address],  [place], [place] [address]   Culture, blood (x 2)     Status: None   Collection Time: 03/06/21  5:01 PM   Specimen: BLOOD LEFT HAND  Result Value Ref Range Status   Specimen Description BLOOD LEFT HAND  Final   Special Requests   Final    BOTTLES DRAWN AEROBIC AND ANAEROBIC Blood Culture results may not be optimal due to an inadequate volume of blood received in culture bottles   Culture   Final    NO GROWTH 5 DAYS Performed at [facility], [place] [address], [place], [place] [address]    Report Status 03/11/2021 FINAL  Final  Aerobic Culture w Gram Stain (superficial specimen)     Status: None   Collection Time: 03/06/21  5:54 PM   Specimen: Abscess  Result Value Ref Range Status   Specimen Description ABSCESS LEFT TOE  Final   Special Requests NONE  Final   Gram Stain   Final    NO  WBC SEEN FEW GRAM POSITIVE COCCI Performed at [facility], [place] [address], [place], [place] [address]    Culture ABUNDANT STAPHYLOCOCCUS AUREUS  Final   Report Status 03/10/2021 FINAL  Final   Organism ID, Bacteria STAPHYLOCOCCUS AUREUS  Final      Susceptibility   Staphylococcus aureus - MIC*    CIPROFLOXACIN <=0.5 SENSITIVE Sensitive     ERYTHROMYCIN <=0.25 SENSITIVE Sensitive     GENTAMICIN <=0.5 SENSITIVE Sensitive     OXACILLIN 2 SENSITIVE Sensitive     TETRACYCLINE <=1 SENSITIVE Sensitive     VANCOMYCIN <=0.5 SENSITIVE Sensitive     TRIMETH/SULFA <=10 SENSITIVE Sensitive     CLINDAMYCIN <=0.25 SENSITIVE Sensitive     RIFAMPIN <=0.5 SENSITIVE Sensitive     Inducible Clindamycin NEGATIVE Sensitive     * ABUNDANT STAPHYLOCOCCUS AUREUS     Time coordinating discharge: Over 30 minutes  SIGNED:   Charlynne Cousins, MD  [facility] 03/15/2021, 9:00 AM Pager   If 7PM-7AM, please contact night-coverage [email or web address] Password [email or web address]

## 2021-03-15 NOTE — Care Management Important Message (Signed)
Important Message  Patient Details  Name: Alisha Terrell MRN: [id number] Date of Birth: [birth year]   Medicare Important Message Given:  Yes     [name] 03/15/2021, 9:50 AM

## 2021-03-15 NOTE — Progress Notes (Signed)
D/C instructions given and reviewed. Awaiting HH set up.

## 2021-03-18 ENCOUNTER — Telehealth: Payer: Self-pay | Admitting: Podiatry

## 2021-03-18 NOTE — Telephone Encounter (Signed)
Can you try to fit her in at the end of the day?

## 2021-03-18 NOTE — Telephone Encounter (Signed)
Pt was in the hospital and would like to know when she should come in for a F/U. Neither of you have anything available this week, so I told her I would check with you all first. She would also like to know when she should change/ take her dressing off. Please advise.

## 2021-03-18 NOTE — Telephone Encounter (Signed)
I can see her at 5:15

## 2021-03-19 ENCOUNTER — Encounter: Payer: Self-pay | Admitting: Podiatry

## 2021-03-19 ENCOUNTER — Other Ambulatory Visit: Payer: Self-pay

## 2021-03-19 ENCOUNTER — Ambulatory Visit: Payer: Medicare HMO | Admitting: Podiatry

## 2021-03-19 DIAGNOSIS — L03032 Cellulitis of left toe: Secondary | ICD-10-CM | POA: Diagnosis not present

## 2021-03-19 DIAGNOSIS — M79674 Pain in right toe(s): Secondary | ICD-10-CM

## 2021-03-19 DIAGNOSIS — Z7901 Long term (current) use of anticoagulants: Secondary | ICD-10-CM | POA: Diagnosis not present

## 2021-03-19 DIAGNOSIS — M79675 Pain in left toe(s): Secondary | ICD-10-CM | POA: Diagnosis not present

## 2021-03-19 DIAGNOSIS — L02612 Cutaneous abscess of left foot: Secondary | ICD-10-CM | POA: Diagnosis not present

## 2021-03-19 DIAGNOSIS — B351 Tinea unguium: Secondary | ICD-10-CM | POA: Diagnosis not present

## 2021-03-19 NOTE — Progress Notes (Signed)
Subjective: 80 year old female presents the office today for posthospital discharge follow-up given blister, cellulitis to her left foot second digit, sepsis.  She said that she is doing much better.  She was discharged in the hospital on Friday And the dressing was changed.  Also asking for the nails be trimmed there is a thick elongated causing discomfort.  Overall she said that she has been feeling better since leaving the hospital but still feels weak. Denies any systemic complaints such as fevers, chills, nausea, vomiting. No acute changes since last appointment, and no other complaints at this time.   Objective: AAO x3, NAD DP/PT pulses palpable bilaterally, CRT less than 3 seconds Still some mild erythema dorsal aspect of the second toe on the left foot mild edema.  This erythema I think is coming more for the blister and the skin came off.  There is no warmth the toe and the erythema is blanchable.  No ascending cellulitis.  There is no drainage or pus.  No significant wound identified at this time. Nails are hypertrophic, dystrophic, brittle, discolored, elongated 10. No surrounding redness or drainage. Tenderness nails 1-5 bilaterally. No open lesions or pre-ulcerative lesions are identified today. No pain with calf compression, swelling, warmth, erythema  Assessment: Improving cellulitis left second toe/blister; symptomatic onychomycosis  Plan: -All treatment options discussed with the patient including all alternatives, risks, complications.  -Debrided the nails x10 without any complications or bleeding -Will do twice a week dressing changes to the second toe.  We will do nonadherent dressing, dry sterile dressing.  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  If needed I also gave my fax, phone number and I could not fill out paperwork if needed. -Patient encouraged to call the office with any questions, concerns, change in symptoms.    Trula Slade DPM

## 2021-03-25 ENCOUNTER — Telehealth (HOSPITAL_COMMUNITY): Payer: Self-pay

## 2021-03-25 ENCOUNTER — Other Ambulatory Visit (HOSPITAL_COMMUNITY): Payer: Self-pay

## 2021-03-25 NOTE — Telephone Encounter (Signed)
Pharmacy Transitions of Care Follow-up Telephone Call  Date of discharge: 03/15/21 Discharge Diagnosis: Afib  How have you been since you were released from the hospital?  Was nauseous in hospital and after discharge due to antibiotic but is feeling better now that she's finished her course. Has separated her discontinued drugs into a plastic bag to save so she doesn't mix them with new meds.  Medication changes made at discharge: START taking: amiodarone (PACERONE)  Cartia XT (diltiazem)  doxycycline (VIBRA-TABS)  Eliquis (apixaban)  metoprolol tartrate (LOPRESSOR)   Icon medications to stop taking  STOP taking: amLODipine 5 MG tablet (NORVASC)  atenolol 50 MG tablet (TENORMIN)  CALCIUM-VITAMIN D PO    Medication changes verified by the patient? Yes    Medication Accessibility:  [facility]: [facility] [address]  Was the patient provided with refills on discharged medications? Yes but none for Eliquis, messaged Dr. Redmond Pulling (PCP) for refills to be sent to home pharmacy. He is not in office today but left message with nurse. They will either be able to refill the Eliquis or contact the patient and cardiologist.  Have all prescriptions been transferred from [facility] to home pharmacy? yes  Is the patient able to afford medications? Has [employer] medicare    Medication Review:  APIXABAN (ELIQUIS)  Apixaban 5 mg BID initiated on 03/15/21.  - Discussed importance of taking medication around the same time everyday  - Reviewed potential DDIs with patient  - Advised patient of medications to avoid (NSAIDs, ASA)  - Educated that Tylenol (acetaminophen) will be the preferred analgesic to prevent risk of bleeding  - Emphasized importance of monitoring for signs and symptoms of bleeding (abnormal bruising, prolonged bleeding, nose bleeds, bleeding from gums, discolored urine, black tarry stools)  - Advised patient to alert all providers of anticoagulation therapy prior to starting a  new medication or having a procedure    Follow-up Appointments:  PCP Hospital f/u appt confirmed? Dr. Rayann Heman on 03/28/21 in [facility] f/u appt confirmed? 05/24/21 with Dr. Purcell Nails in Cardiology  If their condition worsens, is the pt aware to call PCP or go to the Emergency Dept.? yes  Final Patient Assessment: Patient is doing well. Has refills at home pharmacy and follow ups scheduled

## 2021-03-29 DIAGNOSIS — D649 Anemia, unspecified: Secondary | ICD-10-CM | POA: Insufficient documentation

## 2021-03-29 DIAGNOSIS — N179 Acute kidney failure, unspecified: Secondary | ICD-10-CM | POA: Insufficient documentation

## 2021-04-02 ENCOUNTER — Ambulatory Visit: Payer: Medicare HMO | Admitting: Podiatry

## 2021-04-02 ENCOUNTER — Encounter: Payer: Self-pay | Admitting: Podiatry

## 2021-04-02 ENCOUNTER — Other Ambulatory Visit: Payer: Self-pay

## 2021-04-02 DIAGNOSIS — L03032 Cellulitis of left toe: Secondary | ICD-10-CM

## 2021-04-02 DIAGNOSIS — L02612 Cutaneous abscess of left foot: Secondary | ICD-10-CM | POA: Diagnosis not present

## 2021-04-02 DIAGNOSIS — S90426A Blister (nonthermal), unspecified lesser toe(s), initial encounter: Secondary | ICD-10-CM | POA: Diagnosis not present

## 2021-04-02 NOTE — Progress Notes (Addendum)
Subjective: 80 year old female presents the office today for pump evaluation cellulitis, wound to the left second toe.  She said the area is doing much better.  She does keep a bandage on the toe.  Denies any drainage or pus or increase in swelling or redness no red streaks.  Denies any fevers, chills, nausea, vomiting  Objective: AAO x3, NAD DP/PT pulses palpable bilaterally, CRT less than 3 seconds Still some mild erythema dorsal aspect of the second toe however this erythema is from new skin formation.  It is blanchable and there is no warmth there is no ascending cellulitis.  No drainage or pus there is no open lesion. No pain with calf compression, swelling, warmth, erythema  Assessment: Improving cellulitis left second toe/blister  Plan: -All treatment options discussed with the patient including all alternatives, risks, complications.  -At this time the wound is healed.  The skin is still somewhat red but this is new skin formation is present and there is no obvious signs of infection noted.  She can continue a bandage during the day the take the bandage off some at home as well.  Continue surgical shoe for now. She can continue the same wound care orders for now as far as what she is applying.   Follow-up 3 weeks or sooner if needed.  Trula Slade DPM

## 2021-04-03 ENCOUNTER — Telehealth: Payer: Self-pay | Admitting: Podiatry

## 2021-04-03 NOTE — Telephone Encounter (Signed)
Patient calling to request office notes from last visit to be faxed to home health in regards to further care/treatment. Is it ok to fax?  Fax: [phone]

## 2021-04-11 ENCOUNTER — Other Ambulatory Visit (HOSPITAL_COMMUNITY): Payer: Self-pay

## 2021-04-12 ENCOUNTER — Other Ambulatory Visit (HOSPITAL_COMMUNITY): Payer: Self-pay

## 2021-04-23 ENCOUNTER — Ambulatory Visit: Payer: Medicare HMO | Admitting: Podiatry

## 2021-04-23 ENCOUNTER — Encounter: Payer: Self-pay | Admitting: Podiatry

## 2021-04-23 ENCOUNTER — Other Ambulatory Visit: Payer: Self-pay

## 2021-04-23 DIAGNOSIS — S90426A Blister (nonthermal), unspecified lesser toe(s), initial encounter: Secondary | ICD-10-CM

## 2021-04-23 DIAGNOSIS — L84 Corns and callosities: Secondary | ICD-10-CM

## 2021-04-23 DIAGNOSIS — Z7901 Long term (current) use of anticoagulants: Secondary | ICD-10-CM | POA: Diagnosis not present

## 2021-04-29 NOTE — Progress Notes (Signed)
Subjective: 80 year old female presents the office today for follow-up evaluation cellulitis, wound to the left second toe.  She is that she gets some sensitivity at the tip of the toe but overall has been doing much better.  Denies any opening with drainage.  Swelling to the toes also improved.  She states that she is happy that she still has a toe.    Objective: AAO x3, NAD DP/PT pulses palpable bilaterally, CRT less than 3 seconds  There is no significant erythema or warmth.  No ascending cellulitis.  No drainage or pus.  No fluctuance or crepitation. Decreased medial arch height.  On the medial aspect of the left foot along the area of the collapsed foot minimal hyperkeratotic tissue.  No underlying ulceration drainage or signs of infection. No pain with calf compression, swelling, warmth, erythema  Assessment: Improving cellulitis left second toe/blister; preulcerative area  Plan: -All treatment options discussed with the patient including all alternatives, risks, complications.  -The wound is healed.  I would recommend to continue to monitor the second toe on daily basis.  Consolidation.  No wound for any dressing. -Likely debrided some of the callus formation on the left foot plantar medial foot without any underlying ulceration on his normal function.  Moisturizer daily.  Offloading.  Return in about 4 weeks (around 05/21/2021).  Trula Slade DPM

## 2021-05-17 NOTE — Progress Notes (Signed)
Cardiology Office Note   Date:  05/24/2021   ID:  Alisha SCHMELTER, DOB [birth year], MRN [id number]  PCP:  Christain Sacramento, MD  Cardiologist:  Dr.Acharya  CC: Posthospitalization follow-up, A. fib, decompensated CHF.   History of Present Illness: Alisha Terrell is a 80 y.o. female who presents for posthospitalization follow-up with history of hypertension, asthma, TIA, OSA on CPAP, obesity, and chronic back pain.  She initially presented to the ER with complaints of weakness nausea vomiting and heart palpitations.  She had had nothing to eat or drink for 3 days and was found to have sepsis with lactic acidosis.  This is likely from a blister on her toe which was infected.  Cardiology was consulted in the setting of A. fib with RVR.  Atrial fibrillation was newly diagnosed in the setting of sepsis and had spontaneous conversion to sinus rhythm.  She was treated with amiodarone, transition to p.o. amiodarone and on discharge placed on 200 mg daily.  She was also initially on frequent doses of diltiazem and had been changed to long-acting 240 mg daily and metoprolol 50 mg twice daily.  She was placed on Eliquis 5 mg twice daily. CHADS VASC Score 4.  Echocardiogram was completed during hospitalization revealing normal ejection fraction 55% to 60%.  There was mild concentric left ventricular hypertrophy.  There were no valvular abnormalities.  She comes today feeling fairly well.  Trying to get her strength back.  Her main complaints are related to arthritis in her knees and in her back.  This has severely affected her ability to be active.  She is being followed by an orthopedist, she also is using a walker for ambulation.  She had a history of falls in the past but has not had any falls for several months.  She does have an ankle brace to place on the left leg to help her with stability as that leg has been the most worrisome to her.  She is tolerating her medication regiment without evidence of  bleeding, hemoptysis, melena, dizziness, or significant lower extremity edema.  She denies any rapid heart rhythm or palpitations.  She continues to have some mild shortness of breath related to asthma and lung disease.  This has not affected her heart rate in any way.  She remains on diuretics.  She is very medically compliant with a pill dispenser which she fills once a week.  She has excellent family support from her daughter who accompanies her today.  She is learning to rest more.  She is also taking care of her husband who is a patient of Dr. Martinique with multiple cardiovascular issues as well.  Past Medical History:  Diagnosis Date   Arthritis    Asthma    Chronic back pain    Class 2 obesity due to excess calories with body mass index (BMI) of 35.0 to 35.9 in adult    Hypertension    OSA (obstructive sleep apnea)     Past Surgical History:  Procedure Laterality Date   ABDOMINAL SURGERY     CESAREAN SECTION     CHOLECYSTECTOMY       Current Outpatient Medications  Medication Sig Dispense Refill   acetaminophen (TYLENOL) 500 MG tablet Take 500 mg by mouth every 6 (six) hours as needed. Two Tablets Every 6 hours as Needed For Pain     ADVAIR DISKUS 250-50 MCG/DOSE AEPB Inhale 1 puff into the lungs 2 (two) times daily.      amiodarone (  PACERONE) 200 MG tablet Take 1 tablet (200 mg total) by mouth daily. 30 tablet 3   amLODipine (NORVASC) 5 MG tablet TAKE ONE TABLET BY MOUTH DAILY FOR BLOOD PRESSURE CONTROL     apixaban (ELIQUIS) 5 MG TABS tablet Take 1 tablet (5 mg total) by mouth 2 (two) times daily. 60 tablet 0   aspirin EC 81 MG tablet Take 81 mg by mouth daily.     Dextromethorphan-Guaifenesin (ROBITUSSIN COUGH/CHEST DM MAX PO) Take 1 mL by mouth 2 (two) times daily as needed (for cold symptoms).      diclofenac Sodium (VOLTAREN) 1 % GEL Apply 2 g topically at bedtime.     diltiazem (CARDIZEM CD) 240 MG 24 hr capsule Take 1 capsule (240 mg total) by mouth daily. 30 capsule 3    diltiazem (TIAZAC) 240 MG 24 hr capsule Take by mouth.     fluticasone (FLONASE) 50 MCG/ACT nasal spray Place 1 spray into both nostrils at bedtime.      furosemide (LASIX) 20 MG tablet Take 20 mg by mouth daily as needed for fluid.     ibandronate (BONIVA) 150 MG tablet Take 150 mg by mouth every 30 (thirty) days.      losartan (COZAAR) 100 MG tablet Take 100 mg by mouth daily.     metoprolol tartrate (LOPRESSOR) 50 MG tablet Take 1 tablet (50 mg total) by mouth 2 (two) times daily. 60 tablet 3   montelukast (SINGULAIR) 10 MG tablet Take 10 mg by mouth at bedtime.     Multiple Vitamins-Minerals (CENTRUM SILVER ADULT 50+ PO) Take 1 tablet by mouth daily.     omeprazole (PRILOSEC) 40 MG capsule Take 40 mg by mouth in the morning and at bedtime.      ondansetron (ZOFRAN-ODT) 4 MG disintegrating tablet Take 4 mg by mouth every 8 (eight) hours as needed.     PROAIR HFA 108 (90 Base) MCG/ACT inhaler Inhale 1-2 puffs into the lungs every 6 (six) hours as needed for wheezing or shortness of breath.      traMADol (ULTRAM) 50 MG tablet Take 50 mg by mouth every 6 (six) hours as needed for moderate pain or severe pain.     doxycycline (VIBRA-TABS) 100 MG tablet Take 1 tablet (100 mg total) by mouth every 12 (twelve) hours. 14 tablet 0   No current facility-administered medications for this visit.    Allergies:   Codeine and Omnicef [cefdinir]    Social History:  The patient  reports that she has never smoked. She has been exposed to tobacco smoke. She has never used smokeless tobacco. She reports current alcohol use of about 3.0 standard drinks per week. She reports that she does not use drugs.   Family History:  The patient's family history includes Hypertension (age of onset: 28) in her mother; Pneumonia (age of onset: 34) in her father.    ROS: All other systems are reviewed and negative. Unless otherwise mentioned in H&P    PHYSICAL EXAM: VS:  BP 137/65   Pulse (!) 58   Ht '4\' 11"'$  (1.499 m)    Wt 168 lb (76.2 kg)   SpO2 100%   BMI 33.93 kg/m  , BMI Body mass index is 33.93 kg/m. GEN: Well nourished, well developed, in no acute distress HEENT: normal Neck: no JVD, carotid bruits, or masses Cardiac: RRR; 1/6 systolic murmur, hard best at the right sternal boarder. no rubs, or gallops,1+ edema left ankle and pre-tibial.  Respiratory:  Clear to auscultation  bilaterally, normal work of breathing, bilateral crackles in the bases without wheezes.  GI: soft, nontender, nondistended, + BS MS: no deformity or atrophy Skin: warm and dry, no rash Neuro:  Strength and sensation are intact Psych: euthymic mood, full affect   EKG:  Not completed this office visit.   Recent Labs: 03/16/21: B Natriuretic Peptide 309.0; TSH 3.166 03/10/2021: Magnesium 1.9 03/13/2021: ALT 18; BUN 14; Creatinine, Ser 0.88; Hemoglobin 11.4; Platelets 245; Potassium 4.0; Sodium 136    Lipid Panel    Component Value Date/Time   CHOL 134 11/14/2015 1004   TRIG 24.0 11/14/2015 1004   HDL 78.70 11/14/2015 1004   CHOLHDL 2 11/14/2015 1004   VLDL 4.8 11/14/2015 1004   LDLCALC 51 11/14/2015 1004      Wt Readings from Last 3 Encounters:  05/24/21 168 lb (76.2 kg)  03/15/21 188 lb 0.8 oz (85.3 kg)  01/15/21 183 lb 9.6 oz (83.3 kg)      Other studies Reviewed: Echocardiogram 2021-03-16 1. Left ventricular ejection fraction, by estimation, is 55 to 60%. The  left ventricle has normal function. The left ventricle has no regional  wall motion abnormalities. There is mild concentric left ventricular  hypertrophy. Left ventricular diastolic  function could not be evaluated.   2. Right ventricular systolic function is normal. The right ventricular  size is normal. There is mildly elevated pulmonary artery systolic  pressure. The estimated right ventricular systolic pressure is 99991111 mmHg.   3. Left atrial size was mild to moderately dilated.   4. The mitral valve is grossly normal. Trivial mitral valve   regurgitation. No evidence of mitral stenosis.   5. The aortic valve is tricuspid. Aortic valve regurgitation is not  visualized. No aortic stenosis is present.   6. The inferior vena cava is dilated in size with <50% respiratory  variability, suggesting right atrial pressure of 15 mmHg.   ASSESSMENT AND PLAN:  1.  Paroxysmal atrial fibrillation: Currently auscultated as regular rate, without any complaints of palpitations or shortness of breath with exertion related to heart rate.  She is tolerating Eliquis, diltiazem, as well as diuretics.  I have reviewed bleeding risks and she verbalizes understanding.  She does have diltiazem listed twice on her medication list and we are requesting that she call us to clarify which ones she is taking so that we can update her med list appropriately.  She has numerous labs scheduled the second week of August 2022, which have previously been ordered.  No changes in her medication regimen at this time.  She will see Dr. Margaretann Loveless in 3 months.  2.  Hypertension: Currently well controlled on medication regimen.  She keeps up with her blood pressure at home and denies any significant hypotension or hypertension.  No changes in her regimen at this time.  She is to report any significant changes in her blood pressure as she continues to monitor it.  3.  Diastolic CHF: She does not appear to be volume overloaded.  Edema appears to be dependent in the setting of calcium channel blocker, and arthritis in the lower extremity.  We did talk about support hose that she might wear if this becomes an issue for her.  She also has an ankle brace which she can wear to help to support her ankle if she is up a lot on her feet.  She is to continue diuretic therapy, salt avoidance, and to keep her feet elevated is much as possible at home.  4.  Chronic arthritis: This is her main complaint today causing significant decrease in activity and ability to stand for long periods of time (i.e.  cooking).  She is being followed by orthopedics.  Some shortness of breath may be related to her deconditioning.  I have advised her to call us if her breathing status becomes worse and she is feeling palpitations.  5.  Asthma: Followed by pulmonologist with ongoing management.  Current medicines are reviewed at length with the patient today.  I have spent 25 mins dedicated to the care of this patient on the date of this encounter to include pre-visit review of records, assessment, management and diagnostic testing,with shared decision making.  Labs/ tests ordered today include: Labs previously ordered.   Phill Myron. West Pugh, ANP, [facility]   05/24/2021 9:24 AM    [facility] [place] [address] Office [phone] Fax [phone]  Notice: This dictation was prepared with Dragon dictation along with smaller phrase technology. Any transcriptional errors that result from this process are unintentional and may not be corrected upon review.

## 2021-05-21 ENCOUNTER — Ambulatory Visit (INDEPENDENT_AMBULATORY_CARE_PROVIDER_SITE_OTHER): Payer: Medicare HMO | Admitting: Podiatry

## 2021-05-21 ENCOUNTER — Encounter: Payer: Self-pay | Admitting: Podiatry

## 2021-05-21 ENCOUNTER — Other Ambulatory Visit: Payer: Self-pay

## 2021-05-21 DIAGNOSIS — M2141 Flat foot [pes planus] (acquired), right foot: Secondary | ICD-10-CM | POA: Diagnosis not present

## 2021-05-21 DIAGNOSIS — S90426A Blister (nonthermal), unspecified lesser toe(s), initial encounter: Secondary | ICD-10-CM | POA: Diagnosis not present

## 2021-05-21 DIAGNOSIS — M2142 Flat foot [pes planus] (acquired), left foot: Secondary | ICD-10-CM | POA: Diagnosis not present

## 2021-05-21 DIAGNOSIS — S99921A Unspecified injury of right foot, initial encounter: Secondary | ICD-10-CM

## 2021-05-24 ENCOUNTER — Other Ambulatory Visit: Payer: Self-pay

## 2021-05-24 ENCOUNTER — Telehealth: Payer: Self-pay | Admitting: Adult Health

## 2021-05-24 ENCOUNTER — Encounter: Payer: Self-pay | Admitting: Adult Health

## 2021-05-24 ENCOUNTER — Ambulatory Visit: Payer: Medicare HMO | Admitting: Adult Health

## 2021-05-24 VITALS — BP 137/65 | HR 58 | Ht 59.0 in | Wt 168.0 lb

## 2021-05-24 DIAGNOSIS — I480 Paroxysmal atrial fibrillation: Secondary | ICD-10-CM

## 2021-05-24 DIAGNOSIS — M545 Low back pain, unspecified: Secondary | ICD-10-CM

## 2021-05-24 DIAGNOSIS — I10 Essential (primary) hypertension: Secondary | ICD-10-CM

## 2021-05-24 DIAGNOSIS — G8929 Other chronic pain: Secondary | ICD-10-CM

## 2021-05-24 DIAGNOSIS — I5032 Chronic diastolic (congestive) heart failure: Secondary | ICD-10-CM | POA: Diagnosis not present

## 2021-05-24 DIAGNOSIS — G4733 Obstructive sleep apnea (adult) (pediatric): Secondary | ICD-10-CM | POA: Diagnosis not present

## 2021-05-24 NOTE — Telephone Encounter (Signed)
   Pt c/o medication issue:  1. Name of Medication: diltiazem (CARDIZEM CD) 240 MG 24 hr capsule  2. How are you currently taking this medication (dosage and times per day)? Take 1 capsule (240 mg total) by mouth daily.  3. Are you having a reaction (difficulty breathing--STAT)?   4. What is your medication issue? Pt would like to provide this med to Jory Sims, she was told to call back to provide what Diltiazem she is taking

## 2021-05-24 NOTE — Patient Instructions (Signed)
Medication Instructions:  No Changes *If you need a refill on your cardiac medications before your next appointment, please call your pharmacy*   Lab Work: No Labs If you have labs (blood work) drawn today and your tests are completely normal, you will receive your results only by: [place] (if you have MyChart) OR A paper copy in the mail If you have any lab test that is abnormal or we need to change your treatment, we will call you to review the results.   Testing/Procedures: No Testing   Follow-Up: At [facility], you and your health needs are our priority.  As part of our continuing mission to provide you with exceptional heart care, we have created designated Provider Care Teams.  These Care Teams include your primary Cardiologist (physician) and Advanced Practice Providers (APPs -  Physician Assistants and Nurse Practitioners) who all work together to provide you with the care you need, when you need it.   Your next appointment:   3 month(s)  The format for your next appointment:   In Person  Provider:   Cherlynn Kaiser, MD

## 2021-05-27 NOTE — Progress Notes (Signed)
Subjective: 80 year old female presents the office today for follow-up evaluation cellulitis, wound to the left second toe.  She said the toe is doing much better and looks almost normal.  She said that about a week ago she hit her right big toenail on a door but is gotten better over the last week.  Denies any swelling or redness or any drainage or pus.  Occasionally get some swelling to her ankles but does not seem to be new.  No redness or pain associate with the swelling.  No other injuries.  No other concerns.   Objective: AAO x3, NAD DP/PT pulses palpable bilaterally, CRT less than 3 seconds  The wound on the second of the left foot is healed and there is still some trace edema but overall doing much better.  Toes and was of normal color as well. Right big toenail appears to be from adhered to the nailbed there is no edema, erythema.  Slight split at the distal portion of the nail. Decreased medial arch height.  On the medial aspect of the left foot along the area of the collapsed foot is a prominence on the talar head however there is no significant hyperkeratotic tissue.  No underlying ulceration drainage or signs of infection. No pain with calf compression, swelling, warmth, erythema  Assessment: Improving cellulitis left second toe/blister; right toenail injury; flatfoot  Plan: -All treatment options discussed with the patient including all alternatives, risks, complications.  -The wound is healed.  I would recommend to continue to monitor the second toe on daily basis.  Hold off on any further dressing at this point. -Debrided right hallux nail and complications of bleeding monitoring signs or symptoms of infection. -Continue offloading for the prominence of the talar head.  Return in about 4 weeks (around 06/18/2021).  If she is doing well at this appointment will get back on a every 2 to 60-monthschedule.  MTrula SladeDPM

## 2021-05-27 NOTE — Telephone Encounter (Signed)
Return call to pt. She state she was told by Jory Sims, DNP to call back with correct Cardizem dose. Pt report she is currently taking Cardizem CD 240 mg daily.   Will forward to DNP to make aware. Med list updated

## 2021-06-18 ENCOUNTER — Ambulatory Visit: Payer: Medicare HMO | Admitting: Podiatry

## 2021-06-18 ENCOUNTER — Other Ambulatory Visit: Payer: Self-pay

## 2021-06-18 DIAGNOSIS — L600 Ingrowing nail: Secondary | ICD-10-CM | POA: Diagnosis not present

## 2021-06-18 DIAGNOSIS — S90426A Blister (nonthermal), unspecified lesser toe(s), initial encounter: Secondary | ICD-10-CM | POA: Diagnosis not present

## 2021-06-18 MED ORDER — DOXYCYCLINE HYCLATE 100 MG PO TABS: 100.0000 mg | ORAL_TABLET | Freq: Two times a day (BID) | ORAL | 0 refills | Status: DC

## 2021-06-18 NOTE — Patient Instructions (Signed)
Soak Instructions- Have someone else check the temperature of the water. Make sure you dry well after in between the toes/foot    THE DAY AFTER THE PROCEDURE  Place 1/4 cup of epsom salts in a quart of warm tap water.  Submerge your foot or feet with outer bandage intact for the initial soak; this will allow the bandage to become moist and wet for easy lift off.  Once you remove your bandage, continue to soak in the solution for 20 minutes.  This soak should be done twice a day.  Next, remove your foot or feet from solution, blot dry the affected area and cover.  You may use a band aid large enough to cover the area or use gauze and tape.  Apply other medications to the area as directed by the doctor such as polysporin neosporin.  IF YOUR SKIN BECOMES IRRITATED WHILE USING THESE INSTRUCTIONS, IT IS OKAY TO SWITCH TO  WHITE VINEGAR AND WATER. Or you may use antibacterial soap and water to keep the toe clean  Monitor for any signs/symptoms of infection. Call the office immediately if any occur or go directly to the emergency room. Call with any questions/concerns.

## 2021-06-22 NOTE — Progress Notes (Signed)
Subjective: 80 year old female presents the office today for follow-up evaluation cellulitis, wound to the left second toe.  She states the toe is doing well but her main concern today is the right medial hallux nail border.  When I saw her last appointment she had an injury to the toenail when she hit it and its progressive the last saw her to become more of an ingrown toenail issues  and she has noticed some swelling to the nail border.  No drainage or pus.  No other treatment.  No other concerns.  Objective: AAO x3, NAD DP/PT pulses palpable bilaterally, CRT less than 3 seconds  The wound on the second of the left foot is healed and there is still some trace edema but overall doing much better.  Toes and was of normal color as well. On the medial aspect of right hallux toenail incurvation of the nail and there is granulation tissue present on nail border.  Localized edema and faint erythema likely more from inflammation as opposed to infection.  There is no ascending cellulitis.  No drainage or pus. No pain with calf compression, swelling, warmth, erythema  Assessment: Resolved cellulitis left second toe; right hallux ingrown toenail medial nail border  Plan: -All treatment options discussed with the patient including all alternatives, risks, complications.  -Left side is doing well.  Cellulitis and wound appears to be resolved.  Discussed that if inspection of -At this time, recommended partial nail removal without chemical matricectomy to the right medial nail border. Risks and complications were discussed with the patient for which they understand and  verbally consent to the procedure. Under sterile conditions a total of 3 mL of a mixture of 2% lidocaine plain and 0.5% Marcaine plain was infiltrated in a hallux block fashion. Once anesthetized, the skin was prepped in sterile fashion. A tourniquet was then applied. Next the medial border of the hallux nail border was sharply excised making  sure to remove the entire offending nail border. Once the nail was removed, the area was debrided and the underlying skin was intact. The area was irrigated and hemostasis was obtained.  No purulence noted.  A dry sterile dressing was applied. After application of the dressing the tourniquet was removed and there is found to be an immediate capillary refill time to the digit. The patient tolerated the procedure well any complications. Post procedure instructions were discussed the patient for which he verbally understood. Follow-up in one week for nail check or sooner if any problems are to arise. Discussed signs/symptoms of worsening infection and directed to call the office immediately should any occur or go directly to the emergency room. In the meantime, encouraged to call the office with any questions, concerns, changes symptoms.  Return in about 1 week (around 06/25/2021) for nail check .  Alisha Terrell DPM

## 2021-06-25 ENCOUNTER — Other Ambulatory Visit: Payer: Self-pay

## 2021-06-25 ENCOUNTER — Encounter: Payer: Self-pay | Admitting: Podiatry

## 2021-06-25 ENCOUNTER — Ambulatory Visit: Payer: Medicare HMO | Admitting: Podiatry

## 2021-06-25 DIAGNOSIS — Z7901 Long term (current) use of anticoagulants: Secondary | ICD-10-CM | POA: Diagnosis not present

## 2021-06-25 DIAGNOSIS — L600 Ingrowing nail: Secondary | ICD-10-CM

## 2021-06-25 DIAGNOSIS — L84 Corns and callosities: Secondary | ICD-10-CM | POA: Diagnosis not present

## 2021-06-30 NOTE — Progress Notes (Signed)
Subjective: 80 year old female presents the office today for follow-up evaluation after undergoing right partial nail avulsion.  She said that she did have some bleeding that is stopped.  No pus.  No tenderness.  She has been soaking in Epson salts.  She is noticed a small callus at the tip of the left second toe with any ulcerations.  No blisters.  No drainage or pus in the left side.  She has no other concerns.  Objective: AAO x3, NAD DP/PT pulses palpable bilaterally, CRT less than 3 seconds Minimal hyperkeratotic tissue the distal aspect the left second toe.  No underlying ulceration drainage or any obvious signs of infection. Status post partial nail avulsion to the right medial hallux nail border and a scab is forming.  There is no active bleeding or drainage.  No tenderness.  There is no edema, erythema or ascending cellulitis.  No fluctuance or crepitation.  There is no malodor.   No pain with calf compression, swelling, warmth, erythema  Assessment: Status post partial nail avulsion right hallux, hyperkeratotic lesion due to hammertoe left side  Plan: -All treatment options discussed with the patient including all alternatives, risks, complications.  -From a procedure standpoint she is doing well.  I will continue soaking in Epson salts daily and cover with a small amount of antibiotic ointment and a Band-Aid during the day but leave the area open at nighttime. -There is minimal hyperkeratotic tissue left second toe which I debrided today without any complications or bleeding.  Continue offloading. -Patient encouraged to call the office with any questions, concerns, change in symptoms.   Trula Slade DPM

## 2021-07-08 ENCOUNTER — Other Ambulatory Visit: Payer: Self-pay

## 2021-07-08 ENCOUNTER — Ambulatory Visit: Payer: Medicare HMO | Admitting: Podiatry

## 2021-07-08 DIAGNOSIS — L600 Ingrowing nail: Secondary | ICD-10-CM | POA: Diagnosis not present

## 2021-07-08 DIAGNOSIS — L84 Corns and callosities: Secondary | ICD-10-CM

## 2021-07-12 NOTE — Progress Notes (Signed)
Subjective: 80 year old female presents the office today for follow-up evaluation after undergoing right partial nail avulsion.  She states the toe is doing much better.  She denies any drainage or pus very minimal amount of blood on the bandage when she changes it.  No swelling or redness.  Slight discomfort but overall that is improved as well.  She has been keeping antibiotic ointment and a Band-Aid.  She states that doing better than it was previously.  Left foot is been doing well.  Denies any fevers or chills.  Objective: AAO x3, NAD DP/PT pulses palpable bilaterally, CRT less than 3 seconds Regards to the left second toe and left foot there is no open lesions. For the right foot status post partial nail avulsion.  Scab is still present and there is no significant edema, erythema, drainage or pus or any signs of infection.  No tenderness today on exam. No pain with calf compression, swelling, warmth, erythema  Assessment: Status post partial nail avulsion right hallux, hyperkeratotic lesion due to hammertoe left side  Plan: -All treatment options discussed with the patient including all alternatives, risks, complications.  -Debrided some of the loose hyperkeratotic, scab tissue along the right procedure site.  No purulence.  No obvious signs of infection.  I would keep a small amount of antibiotic ointment followed by bandages daily.  Leave it open at nighttime.  Monitor closely for any signs or symptoms of infection. -Left foot is been doing well.  Discussed daily foot inspection.  Return in about 6 weeks (around 08/19/2021).  Alisha Terrell DPM

## 2021-08-20 ENCOUNTER — Other Ambulatory Visit: Payer: Self-pay

## 2021-08-20 ENCOUNTER — Encounter: Payer: Self-pay | Admitting: Internal Medicine

## 2021-08-20 ENCOUNTER — Ambulatory Visit: Payer: Medicare HMO | Admitting: Internal Medicine

## 2021-08-20 VITALS — BP 136/72 | HR 59 | Resp 20 | Ht 59.0 in | Wt 171.6 lb

## 2021-08-20 DIAGNOSIS — I10 Essential (primary) hypertension: Secondary | ICD-10-CM | POA: Diagnosis not present

## 2021-08-20 DIAGNOSIS — G459 Transient cerebral ischemic attack, unspecified: Secondary | ICD-10-CM

## 2021-08-20 DIAGNOSIS — D6869 Other thrombophilia: Secondary | ICD-10-CM

## 2021-08-20 DIAGNOSIS — Z79899 Other long term (current) drug therapy: Secondary | ICD-10-CM

## 2021-08-20 DIAGNOSIS — G4733 Obstructive sleep apnea (adult) (pediatric): Secondary | ICD-10-CM

## 2021-08-20 DIAGNOSIS — I5032 Chronic diastolic (congestive) heart failure: Secondary | ICD-10-CM

## 2021-08-20 DIAGNOSIS — I480 Paroxysmal atrial fibrillation: Secondary | ICD-10-CM

## 2021-08-20 NOTE — Patient Instructions (Signed)
Medication Instructions:  STOP amiodarone  *If you need a refill on your cardiac medications before your next appointment, please call your pharmacy*  Follow-Up: At [facility], you and your health needs are our priority.  As part of our continuing mission to provide you with exceptional heart care, we have created designated Provider Care Teams.  These Care Teams include your primary Cardiologist (physician) and Advanced Practice Providers (APPs -  Physician Assistants and Nurse Practitioners) who all work together to provide you with the care you need, when you need it.  We recommend signing up for the patient portal called "MyChart".  Sign up information is provided on this After Visit Summary.  MyChart is used to connect with patients for Virtual Visits (Telemedicine).  Patients are able to view lab/test results, encounter notes, upcoming appointments, etc.  Non-urgent messages can be sent to your provider as well.   To learn more about what you can do with MyChart, go to [email or web address].    Your next appointment:   6 month(s)  The format for your next appointment:   In Person  Provider:   Elouise Munroe, MD {

## 2021-08-20 NOTE — Progress Notes (Signed)
Cardiology Office Note:    Date:  08/20/2021   ID:  Alisha Terrell, DOB [birth year], MRN [id number]  PCP:  Alisha Sacramento, MD  Cardiologist:  Alisha Munroe, MD  Electrophysiologist:  None   Referring MD: Alisha Sacramento, MD   Chief Complaint/Reason for Referral: Atrial fibrillation  History of Present Illness:    Alisha Terrell is a 80 y.o. female with a history of HTN, asthma, TIA, OSA on CPAP,obesity, chronic back pain, who presented to the ER 03/06/2021 with c/o weakness, nausea, vomiting, palpitation, and nothing to eat and drink for 3 days, found to have sepsis with lactic acidosis >2, 2/2 presumed toe blister with infection, AKI, and new onset A. fib with RVR with a CHA2DS2-VASc score of 5 (hypertension, age x2, TIA x2).  I saw her while she was in the hospital, where we titrated beta-blocker and amiodarone.  Plan was initially for cardioversion however she converted while on amiodarone, and fortunately her sepsis improved and resolved.  She follows with podiatry who closely monitors her feet.  There is 1 pressure point with some erythema today, she notes that her podiatrist is monitoring closely.  She was most recently seen in cardiology clinic by Alisha Sims, NP.  She was doing well at that visit and was felt to be euvolemic.  She presents today with her daughter and continues to do well.  She continues to have primarily arthritic concerns, but otherwise has no cardiovascular complaints.  She does not feel she has had recurrent atrial fibrillation and ECG today is sinus rhythm.  We discussed continued use of apixaban 5 mg twice daily, she has had no bleeding.  We discussed potentially stopping amiodarone given resolution of sepsis and maintenance of sinus rhythm.  The patient denies chest pain, chest pressure, dyspnea at rest or with exertion, palpitations, PND, orthopnea, or leg swelling. Denies cough, fever, chills. Denies nausea, vomiting. Denies syncope or presyncope.  Denies dizziness or lightheadedness.    Past Medical History:  Diagnosis Date   Arthritis    Asthma    Chronic back pain    Class 2 obesity due to excess calories with body mass index (BMI) of 35.0 to 35.9 in adult    Hypertension    OSA (obstructive sleep apnea)     Past Surgical History:  Procedure Laterality Date   ABDOMINAL SURGERY     CESAREAN SECTION     CHOLECYSTECTOMY      Current Medications: Current Meds  Medication Sig   acetaminophen (TYLENOL) 500 MG tablet Take 500 mg by mouth every 6 (six) hours as needed. Two Tablets Every 6 hours as Needed For Pain   ADVAIR DISKUS 250-50 MCG/DOSE AEPB Inhale 1 puff into the lungs 2 (two) times daily.    amiodarone (PACERONE) 200 MG tablet Take 1 tablet (200 mg total) by mouth daily.   apixaban (ELIQUIS) 5 MG TABS tablet Take 1 tablet (5 mg total) by mouth 2 (two) times daily.   aspirin EC 81 MG tablet Take 81 mg by mouth daily.   Dextromethorphan-Guaifenesin (ROBITUSSIN COUGH/CHEST DM MAX PO) Take 1 mL by mouth 2 (two) times daily as needed (for cold symptoms).    diclofenac Sodium (VOLTAREN) 1 % GEL Apply 2 g topically at bedtime.   diltiazem (CARDIZEM CD) 240 MG 24 hr capsule Take 1 capsule (240 mg total) by mouth daily.   fluticasone (FLONASE) 50 MCG/ACT nasal spray Place 1 spray into both nostrils at bedtime.    furosemide (LASIX)  20 MG tablet Take 20 mg by mouth daily as needed for fluid.   ibandronate (BONIVA) 150 MG tablet Take 150 mg by mouth every 30 (thirty) days.    losartan (COZAAR) 100 MG tablet Take 100 mg by mouth daily.   metoprolol tartrate (LOPRESSOR) 50 MG tablet Take 1 tablet (50 mg total) by mouth 2 (two) times daily.   montelukast (SINGULAIR) 10 MG tablet Take 10 mg by mouth at bedtime.   Multiple Vitamins-Minerals (CENTRUM SILVER ADULT 50+ PO) Take 1 tablet by mouth daily.   omeprazole (PRILOSEC) 40 MG capsule Take 40 mg by mouth in the morning and at bedtime.    ondansetron (ZOFRAN-ODT) 4 MG  disintegrating tablet Take 4 mg by mouth every 8 (eight) hours as needed.   PROAIR HFA 108 (90 Base) MCG/ACT inhaler Inhale 1-2 puffs into the lungs every 6 (six) hours as needed for wheezing or shortness of breath.    traMADol (ULTRAM) 50 MG tablet Take 50 mg by mouth every 6 (six) hours as needed for moderate pain or severe pain.     Allergies:   Codeine and Omnicef [cefdinir]   Social History   Tobacco Use   Smoking status: Never    Passive exposure: Yes   Smokeless tobacco: Never  Vaping Use   Vaping Use: Never used  Substance Use Topics   Alcohol use: Yes    Alcohol/week: 3.0 standard drinks    Types: 3 Glasses of wine per week   Drug use: No     Family History: The patient's family history includes Hypertension (age of onset: 83) in her mother; Pneumonia (age of onset: 34) in her father.  ROS:   Please see the history of present illness.    All other systems reviewed and are negative.  EKGs/Labs/Other Studies Reviewed:    The following studies were reviewed today:  EKG:  Sinus bradycardia, 1st deg AVB, PAC  Imaging studies that I have independently reviewed today: N/A  Recent Labs: 03/08/2021: B Natriuretic Peptide 309.0; TSH 3.166 03/10/2021: Magnesium 1.9 03/13/2021: ALT 18; BUN 14; Creatinine, Ser 0.88; Hemoglobin 11.4; Platelets 245; Potassium 4.0; Sodium 136  Recent Lipid Panel    Component Value Date/Time   CHOL 134 11/14/2015 1004   TRIG 24.0 11/14/2015 1004   HDL 78.70 11/14/2015 1004   CHOLHDL 2 11/14/2015 1004   VLDL 4.8 11/14/2015 1004   LDLCALC 51 11/14/2015 1004    Physical Exam:    VS:  BP 136/72 (BP Location: Left Arm, Patient Position: Sitting, Cuff Size: Normal)   Pulse (!) 59   Resp 20   Ht 4\' 11"  (1.499 m)   Wt 171 lb 9.6 oz (77.8 kg)   SpO2 96%   BMI 34.66 kg/m     Wt Readings from Last 5 Encounters:  08/20/21 171 lb 9.6 oz (77.8 kg)  05/24/21 168 lb (76.2 kg)  03/15/21 188 lb 0.8 oz (85.3 kg)  01/15/21 183 lb 9.6 oz (83.3 kg)   10/16/20 177 lb (80.3 kg)    Constitutional: No acute distress Eyes: sclera non-icteric, normal conjunctiva and lids ENMT: normal dentition, moist mucous membranes Cardiovascular: regular rhythm, normal rate, no murmur. S1 and S2 normal. No jugular venous distention.  Respiratory: clear to auscultation bilaterally GI : normal bowel sounds, soft and nontender. No distention.   MSK: extremities warm, well perfused. No edema.  NEURO: grossly nonfocal exam, moves all extremities. PSYCH: alert and oriented x 3, normal mood and affect.   ASSESSMENT:  1. Paroxysmal atrial fibrillation ([place])   2. Secondary hypercoagulable state ([place])   3. TIA (transient ischemic attack)   4. Essential hypertension   5. Diastolic CHF, chronic (HCC)   6. OSA (obstructive sleep apnea)   7. Medication management    PLAN:    Paroxysmal atrial fibrillation ([place]) - Plan: EKG 12-Lead Secondary hypercoagulable state ([place]) TIA (transient ischemic attack) -We discussed stopping amiodarone today given no recurrence of sinus rhythm since hospitalization in May and likely trigger of sepsis.  She is in agreement as is her daughter.  With any recurrence of atrial fibrillation we can certainly resume amiodarone but she is also asymptomatic when in atrial fibrillation therefore no strong indication for continued antiarrhythmic therapy. -I would continue Eliquis 5 mg twice daily as well as diltiazem 240 mg daily for anticoagulation and rate control.  Appears she is also on metoprolol 50 mg twice daily.  Okay to continue for now as we come off of amiodarone, however we may be able to consolidate these to over time.  Essential hypertension -Continue diltiazem 240 mg daily, Lasix 20 mg as needed for volume, losartan 100 mg daily, metoprolol tartrate 50 mg twice daily.  Blood pressure is relatively well controlled today.  Diastolic CHF, chronic (HCC)-as needed Lasix, he appears euvolemic today.  OSA (obstructive sleep  apnea)-continue CPAP.  Total time of encounter: 30 minutes total time of encounter, including 20 minutes spent in face-to-face patient care on the date of this encounter. This time includes coordination of care and counseling regarding above mentioned problem list. Remainder of non-face-to-face time involved reviewing chart documents/testing relevant to the patient encounter and documentation in the medical record. I have independently reviewed documentation from referring provider.   Cherlynn Kaiser, MD, [place]   Shared Decision Making/Informed Consent:       Medication Adjustments/Labs and Tests Ordered: Current medicines are reviewed at length with the patient today.  Concerns regarding medicines are outlined above.   Orders Placed This Encounter  Procedures   EKG 12-Lead    No orders of the defined types were placed in this encounter.   Patient Instructions  Medication Instructions:  STOP amiodarone  *If you need a refill on your cardiac medications before your next appointment, please call your pharmacy*  Follow-Up: At [facility], you and your health needs are our priority.  As part of our continuing mission to provide you with exceptional heart care, we have created designated Provider Care Teams.  These Care Teams include your primary Cardiologist (physician) and Advanced Practice Providers (APPs -  Physician Assistants and Nurse Practitioners) who all work together to provide you with the care you need, when you need it.  We recommend signing up for the patient portal called "MyChart".  Sign up information is provided on this After Visit Summary.  MyChart is used to connect with patients for Virtual Visits (Telemedicine).  Patients are able to view lab/test results, encounter notes, upcoming appointments, etc.  Non-urgent messages can be sent to your provider as well.   To learn more about what you can do with MyChart, go to  [email or web address].    Your next appointment:   6 month(s)  The format for your next appointment:   In Person  Provider:   Elouise Munroe, MD {

## 2021-08-27 ENCOUNTER — Ambulatory Visit: Payer: Medicare HMO | Admitting: Podiatry

## 2021-08-27 ENCOUNTER — Other Ambulatory Visit: Payer: Self-pay

## 2021-08-27 DIAGNOSIS — B351 Tinea unguium: Secondary | ICD-10-CM | POA: Diagnosis not present

## 2021-08-27 DIAGNOSIS — M79675 Pain in left toe(s): Secondary | ICD-10-CM | POA: Diagnosis not present

## 2021-08-27 DIAGNOSIS — M79674 Pain in right toe(s): Secondary | ICD-10-CM | POA: Diagnosis not present

## 2021-08-27 DIAGNOSIS — Z7901 Long term (current) use of anticoagulants: Secondary | ICD-10-CM

## 2021-09-02 NOTE — Progress Notes (Signed)
Subjective: 80 y.o. returns the office today for painful, elongated, thickened toenails which she cannot trim herself and for follow-up evaluation of ingrown toenail right big toe.  She is the toe wound is completely healed.  She has little sensitivity but no swelling or redness or any drainage.  Denies any redness or drainage around the nails. Denies any systemic complaints such as fevers, chills, nausea, vomiting.   PCP: Christain Sacramento, MD  Objective: AAO 3, NAD DP/PT pulses palpable, CRT less than 3 seconds.  Nails hypertrophic, dystrophic, elongated, brittle, discolored 9. There is tenderness overlying the nails 1-5 on the left and 2 through 5 on the right. There is no surrounding erythema or drainage along the nail sites. No open lesions or pre-ulcerative lesions are identified. No other areas of tenderness bilateral lower extremities. No overlying edema, erythema, increased warmth. No pain with calf compression, swelling, warmth, erythema.  Assessment: Patient presents with symptomatic onychomycosis, ingrown toenail  Plan: -Treatment options including alternatives, risks, complications were discussed -Nails sharply debrided 9 without complication/bleeding. -Ingrown toenail is healing well.  No signs of infection.  Monitoring signs or symptoms of infection. -Discussed daily foot inspection. If there are any changes, to call the office immediately.  -Follow-up in 3 months or sooner if any problems are to arise. In the meantime, encouraged to call the office with any questions, concerns, changes symptoms.  Return in about 3 months (around 11/27/2021).  Celesta Gentile, DPM

## 2021-11-28 ENCOUNTER — Other Ambulatory Visit: Payer: Self-pay

## 2021-11-28 ENCOUNTER — Ambulatory Visit: Payer: Medicare HMO | Admitting: Podiatry

## 2021-11-28 DIAGNOSIS — M79674 Pain in right toe(s): Secondary | ICD-10-CM | POA: Diagnosis not present

## 2021-11-28 DIAGNOSIS — B351 Tinea unguium: Secondary | ICD-10-CM

## 2021-11-28 DIAGNOSIS — M79675 Pain in left toe(s): Secondary | ICD-10-CM

## 2021-11-28 DIAGNOSIS — Z7901 Long term (current) use of anticoagulants: Secondary | ICD-10-CM | POA: Diagnosis not present

## 2021-12-01 NOTE — Progress Notes (Signed)
Subjective: 81 y.o. returns the office today for painful, elongated, thickened toenails which she cannot trim herself and for follow-up evaluation of ingrown toenail right big toe.  She said that she been doing well she has not seen any swelling or redness or any drainage of the toenail sites.  She has not seen any open sores.  No recent injuries or changes no other concerns.  PCP: Christain Sacramento, MD  Objective: AAO 3, NAD DP/PT pulses palpable, CRT less than 3 seconds.  Nails hypertrophic, dystrophic, elongated, brittle, discolored 10. There is tenderness overlying the nails 1-5 bilaterally. There is no surrounding erythema or drainage along the nail sites. No open lesions or pre-ulcerative lesions are identified. No pain with calf compression, swelling, warmth, erythema.  Assessment: Patient presents with symptomatic onychomycosis  Plan: -Treatment options including alternatives, risks, complications were discussed -Nails sharply debrided 10 without complication/bleeding. -Discussed daily foot inspection. If there are any changes, to call the office immediately.  -Follow-up in 3 months or sooner if any problems are to arise. In the meantime, encouraged to call the office with any questions, concerns, changes symptoms.  Return in about 3 months  Celesta Gentile, DPM

## 2022-02-27 ENCOUNTER — Ambulatory Visit: Payer: Medicare HMO | Admitting: Podiatry

## 2022-03-18 ENCOUNTER — Ambulatory Visit: Payer: Medicare HMO | Admitting: Podiatry

## 2022-03-25 ENCOUNTER — Ambulatory Visit: Payer: Medicare HMO | Admitting: Podiatry

## 2022-03-25 DIAGNOSIS — Z7901 Long term (current) use of anticoagulants: Secondary | ICD-10-CM

## 2022-03-25 DIAGNOSIS — B351 Tinea unguium: Secondary | ICD-10-CM | POA: Diagnosis not present

## 2022-03-25 DIAGNOSIS — M79675 Pain in left toe(s): Secondary | ICD-10-CM | POA: Diagnosis not present

## 2022-03-25 DIAGNOSIS — M79674 Pain in right toe(s): Secondary | ICD-10-CM

## 2022-03-25 DIAGNOSIS — L600 Ingrowing nail: Secondary | ICD-10-CM

## 2022-03-25 DIAGNOSIS — L601 Onycholysis: Secondary | ICD-10-CM | POA: Diagnosis not present

## 2022-03-25 MED ORDER — DOXYCYCLINE HYCLATE 100 MG PO TABS: 100.0000 mg | ORAL_TABLET | Freq: Two times a day (BID) | ORAL | Status: DC

## 2022-03-27 NOTE — Progress Notes (Signed)
Cardiology Clinic Note   Patient Name: Alisha Terrell Date of Encounter: 03/28/2022  Primary Care Provider:  Christain Sacramento, MD Primary Cardiologist:  Elouise Munroe, MD  Patient Profile    81 year old female with history of hypertension, TIA, OSA on CPAP, chronic diastolic CHF (on as needed Lasix), asthma, obesity, and chronic back pain.  Hospitalization May 2022 with sepsis and lactic acidosis along with acute kidney injury and new onset atrial fibrillation with RVR with a CHA2DS2-VASc score of 5.  She converted pharmacologically on amiodarone to normal sinus rhythm.  She remained on apixaban 5 mg twice daily on last office appointment on 08/20/2021.  Amiodarone was discontinued at that time due to no recurrence of atrial fibrillation.  She was to continue apixaban 5 mg twice daily and diltiazem 240 mg daily along with metoprolol 50 mg twice daily.  Past Medical History    Past Medical History:  Diagnosis Date   Arthritis    Asthma    Chronic back pain    Class 2 obesity due to excess calories with body mass index (BMI) of 35.0 to 35.9 in adult    Hypertension    OSA (obstructive sleep apnea)    Past Surgical History:  Procedure Laterality Date   ABDOMINAL SURGERY     CESAREAN SECTION     CHOLECYSTECTOMY      Allergies  Allergies  Allergen Reactions   Codeine Rash and Other (See Comments)    Agitation, bad dreams   Omnicef [Cefdinir] Nausea And Vomiting    Confirmed with patient at bedside that no rash - just had significant GI effects    History of Present Illness    Alisha Terrell presents today for ongoing assessment and management of hypertension, with history of atrial fibrillation recently seen by Dr. Margaretann Loveless on 08/20/2021 remained in normal sinus rhythm and was taken off of amiodarone but continued on apixaban, diltiazem, and metoprolol.  She also is being treated with as needed Lasix in setting of chronic diastolic CHF.  She is here today with her daughter,  and, with the main complaint of chronic lumbar back pain and arthritis with radiculopathy.  She speaks at length about her pain and is to follow-up with pain management, and is due to call them.  She denies any palpitations, racing heart rate, dizziness, or dyspnea.  She is not very active due to her chronic back pain.  She uses a rolling walker for ambulation.  She has had some mild gum bleeding, but has changed her toothbrush to a soft bristle brush.  This has alleviated this.  She denies any melena or epistaxis.  She does have some mild dependent lower extremity edema by the end of the day.  She occasionally will take a Lasix 20 mg to help with this.  She is medically compliant.  Home Medications    Current Outpatient Medications  Medication Sig Dispense Refill   acetaminophen (TYLENOL) 500 MG tablet Take 500 mg by mouth every 6 (six) hours as needed. Two Tablets Every 6 hours as Needed For Pain     ADVAIR DISKUS 250-50 MCG/DOSE AEPB Inhale 1 puff into the lungs 2 (two) times daily.      aspirin EC 81 MG tablet Take 81 mg by mouth daily.     Azelastine HCl 137 MCG/SPRAY SOLN Place into both nostrils.     Dextromethorphan-Guaifenesin (ROBITUSSIN COUGH/CHEST DM MAX PO) Take 1 mL by mouth 2 (two) times daily as needed (for cold symptoms).  diclofenac Sodium (VOLTAREN) 1 % GEL Apply 2 g topically at bedtime.     diltiazem (CARDIZEM CD) 240 MG 24 hr capsule Take 1 capsule (240 mg total) by mouth daily. 30 capsule 3   doxycycline (VIBRA-TABS) 100 MG tablet Take 1 tablet (100 mg total) by mouth 2 (two) times daily. 14 tablet 01   fluticasone (FLONASE) 50 MCG/ACT nasal spray Place 1 spray into both nostrils at bedtime.      ibandronate (BONIVA) 150 MG tablet Take 150 mg by mouth every 30 (thirty) days.      montelukast (SINGULAIR) 10 MG tablet Take 10 mg by mouth at bedtime.     Multiple Vitamins-Minerals (CENTRUM SILVER ADULT 50+ PO) Take 1 tablet by mouth daily.     omeprazole (PRILOSEC) 40 MG  capsule Take 40 mg by mouth in the morning and at bedtime.      ondansetron (ZOFRAN-ODT) 4 MG disintegrating tablet Take 4 mg by mouth every 8 (eight) hours as needed.     PROAIR HFA 108 (90 Base) MCG/ACT inhaler Inhale 1-2 puffs into the lungs every 6 (six) hours as needed for wheezing or shortness of breath.      traMADol (ULTRAM) 50 MG tablet Take 50 mg by mouth every 6 (six) hours as needed for moderate pain or severe pain.     apixaban (ELIQUIS) 5 MG TABS tablet Take 1 tablet (5 mg total) by mouth 2 (two) times daily. 60 tablet 0   furosemide (LASIX) 20 MG tablet Take 1 tablet (20 mg total) by mouth daily as needed for fluid. 30 tablet 3   losartan (COZAAR) 100 MG tablet Take 1 tablet (100 mg total) by mouth daily. 30 tablet 3   metoprolol tartrate (LOPRESSOR) 50 MG tablet Take 1 tablet (50 mg total) by mouth 2 (two) times daily. 60 tablet 3   No current facility-administered medications for this visit.     Family History    Family History  Problem Relation Age of Onset   Hypertension Mother 13   Pneumonia Father 37   She indicated that her mother is deceased. She indicated that her father is deceased.  Social History    Social History   Socioeconomic History   Marital status: Married    Spouse name: Dorann Lodge   Number of children: 2   Years of education: Not on file   Highest education level: Not on file  Occupational History   Occupation: retired  Tobacco Use   Smoking status: Never    Passive exposure: Yes   Smokeless tobacco: Never  Vaping Use   Vaping Use: Never used  Substance and Sexual Activity   Alcohol use: Yes    Alcohol/week: 3.0 standard drinks of alcohol    Types: 3 Glasses of wine per week   Drug use: No   Sexual activity: Not on file  Other Topics Concern   Not on file  Social History Narrative   Patient is right-handed. She lives with her husband in a one level home. She does not exercise.   Social Determinants of Health   Financial  Resource Strain: Not on file  Food Insecurity: Not on file  Transportation Needs: Not on file  Physical Activity: Not on file  Stress: Not on file  Social Connections: Not on file  Intimate Partner Violence: Not on file     Review of Systems    General:  No chills, fever, night sweats or weight changes.  Cardiovascular:  No chest pain, dyspnea  on exertion, positive for dependent edema, orthopnea, palpitations, paroxysmal nocturnal dyspnea. Dermatological: No rash, lesions/masses Respiratory: No cough, dyspnea Urologic: No hematuria, dysuria Abdominal:   No nausea, vomiting, diarrhea, bright red blood per rectum, melena, or hematemesis Neurologic:  No visual changes, wkns, changes in mental status. All other systems reviewed and are otherwise negative except as noted above.     Physical Exam    VS:  BP 130/70 (BP Location: Left Arm)   Pulse 66   Ht '4\' 11"'$  (1.499 m)   Wt 180 lb 9.6 oz (81.9 kg)   SpO2 98%   BMI 36.48 kg/m  , BMI Body mass index is 36.48 kg/m.     GEN: Well nourished, well developed, in no acute distress. HEENT: normal. Neck: Supple, no JVD, carotid bruits, or masses. Cardiac: RRR, soft systolic murmurs, rare extrasystole rubs, or gallops. No clubbing, cyanosis, mild dependent edema.  Radials/DP/PT 2+ and equal bilaterally.  Respiratory:  Respirations regular and unlabored, clear to auscultation bilaterally. GI: Soft, nontender, nondistended, BS + x 4. MS: no deformity or atrophy.  Pain elicited with movement of torso. Skin: warm and dry, no rash. Neuro:  Strength and sensation are intact. Psych: Normal affect.  Accessory Clinical Findings    ECG personally reviewed by me today-sinus rhythm with first-degree AV block, PR interval 0.254 ms, frequent PVCs, right bundle branch block, heart rate of 66 bpm.- No acute changes  Lab Results  Component Value Date   WBC 8.5 03/13/2021   HGB 11.4 (L) 03/13/2021   HCT 33.8 (L) 03/13/2021   MCV 86.2 03/13/2021    PLT 245 03/13/2021   Lab Results  Component Value Date   CREATININE 0.88 03/13/2021   BUN 14 03/13/2021   NA 136 03/13/2021   K 4.0 03/13/2021   CL 104 03/13/2021   CO2 21 (L) 03/13/2021   Lab Results  Component Value Date   ALT 18 03/13/2021   AST 15 03/13/2021   ALKPHOS 36 (L) 03/13/2021   BILITOT 0.9 03/13/2021   Lab Results  Component Value Date   CHOL 134 11/14/2015   HDL 78.70 11/14/2015   LDLCALC 51 11/14/2015   TRIG 24.0 11/14/2015   CHOLHDL 2 11/14/2015    Lab Results  Component Value Date   HGBA1C 6.1 (H) 03/06/2021    Review of Prior Studies: Echocardiogram 03/08/2021 1. Left ventricular ejection fraction, by estimation, is 55 to 60%. The  left ventricle has normal function. The left ventricle has no regional  wall motion abnormalities. There is mild concentric left ventricular  hypertrophy. Left ventricular diastolic  function could not be evaluated.   2. Right ventricular systolic function is normal. The right ventricular  size is normal. There is mildly elevated pulmonary artery systolic  pressure. The estimated right ventricular systolic pressure is 38.1 mmHg.   3. Left atrial size was mild to moderately dilated.   4. The mitral valve is grossly normal. Trivial mitral valve  regurgitation. No evidence of mitral stenosis.   5. The aortic valve is tricuspid. Aortic valve regurgitation is not  visualized. No aortic stenosis is present.   6. The inferior vena cava is dilated in size with <50% respiratory  variability, suggesting right atrial pressure of 15 mmHg  Assessment & Plan   1.  Paroxysmal atrial fibrillation: CHA2DS2-VASc score 5.  Remains on apixaban 5 mg twice daily and heart rate control with diltiazem.  Refills are provided.  A CBC is being drawn to evaluate for anemia.  She does  plan to have a spinal injection through pain management.  If this is the case she may hold apixaban for 48 hours prior to the procedure, and begin as soon as possible  there afterwards.  2.  Hypertension: Currently well controlled we will refill metoprolol, losartan.  She does have some mild dependent edema for which she uses Lasix as needed.  I will check a BMET for evaluation of kidney function and potassium status.  3.  Chronic lumbar back pain.  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  Being followed by pain management.  Plans for spinal injection for pain control, but she needs to make an appointment to discuss.  Okay to hold Eliquis for 48 hours prior to procedure and to begin again as soon as possible thereafter.  4.  Chronic diastolic CHF: No evidence of volume overload today.  Weight is stable.  She is not very active due to chronic pain from arthritis, however she denies any dyspnea on exertion PND orthopnea.  Continue current medication regimen and Lasix as needed.  Current medicines are reviewed at length with the patient today.  I have spent 35 min's  dedicated to the care of this patient on the date of this encounter to include pre-visit review of records, assessment, management and diagnostic testing,with shared decision making.  Signed, Phill Myron. West Pugh, ANP, AACC   03/28/2022 4:48 PM    [facility] [place] [address] Office [phone] Fax [phone]  Notice: This dictation was prepared with Dragon dictation along with smaller phrase technology. Any transcriptional errors that result from this process are unintentional and may not be corrected upon review.

## 2022-03-28 ENCOUNTER — Encounter: Payer: Self-pay | Admitting: Adult Health

## 2022-03-28 ENCOUNTER — Ambulatory Visit: Payer: Medicare HMO | Admitting: Adult Health

## 2022-03-28 VITALS — BP 130/70 | HR 66 | Ht 59.0 in | Wt 180.6 lb

## 2022-03-28 DIAGNOSIS — Z79899 Other long term (current) drug therapy: Secondary | ICD-10-CM

## 2022-03-28 DIAGNOSIS — I480 Paroxysmal atrial fibrillation: Secondary | ICD-10-CM

## 2022-03-28 DIAGNOSIS — I5032 Chronic diastolic (congestive) heart failure: Secondary | ICD-10-CM

## 2022-03-28 DIAGNOSIS — M545 Low back pain, unspecified: Secondary | ICD-10-CM

## 2022-03-28 DIAGNOSIS — G8929 Other chronic pain: Secondary | ICD-10-CM

## 2022-03-28 MED ORDER — LOSARTAN POTASSIUM 100 MG PO TABS: 100.0000 mg | ORAL_TABLET | Freq: Every day | ORAL | 3 refills | Status: DC

## 2022-03-28 MED ORDER — FUROSEMIDE 20 MG PO TABS: 20.0000 mg | ORAL_TABLET | Freq: Every day | ORAL | 3 refills | Status: DC | PRN

## 2022-03-28 MED ORDER — APIXABAN 5 MG PO TABS: 5.0000 mg | ORAL_TABLET | Freq: Two times a day (BID) | ORAL | 0 refills | Status: DC

## 2022-03-28 MED ORDER — METOPROLOL TARTRATE 50 MG PO TABS: 50.0000 mg | ORAL_TABLET | Freq: Two times a day (BID) | ORAL | 3 refills | Status: DC

## 2022-03-28 NOTE — Patient Instructions (Signed)
Medication Instructions:  No Changes *If you need a refill on your cardiac medications before your next appointment, please call your pharmacy*   Lab Work: BMET, CBC. Today If you have labs (blood work) drawn today and your tests are completely normal, you will receive your results only by: [place] (if you have MyChart) OR A paper copy in the mail If you have any lab test that is abnormal or we need to change your treatment, we will call you to review the results.   Testing/Procedures: No Testing   Follow-Up: At [facility], you and your health needs are our priority.  As part of our continuing mission to provide you with exceptional heart care, we have created designated Provider Care Teams.  These Care Teams include your primary Cardiologist (physician) and Advanced Practice Providers (APPs -  Physician Assistants and Nurse Practitioners) who all work together to provide you with the care you need, when you need it.  We recommend signing up for the patient portal called "MyChart".  Sign up information is provided on this After Visit Summary.  MyChart is used to connect with patients for Virtual Visits (Telemedicine).  Patients are able to view lab/test results, encounter notes, upcoming appointments, etc.  Non-urgent messages can be sent to your provider as well.   To learn more about what you can do with MyChart, go to [email or web address].    Your next appointment:   6 month(s)  The format for your next appointment:   In Person  Provider:   Elouise Munroe, MD        Important Information About Sugar

## 2022-03-29 LAB — BASIC METABOLIC PANEL
BUN/Creatinine Ratio: 18 (ref 12–28)
BUN: 17 mg/dL (ref 8–27)
CO2: 22 mmol/L (ref 20–29)
Calcium: 9.5 mg/dL (ref 8.7–10.3)
Chloride: 100 mmol/L (ref 96–106)
Creatinine, Ser: 0.96 mg/dL (ref 0.57–1.00)
Glucose: 131 mg/dL — ABNORMAL HIGH (ref 70–99)
Potassium: 4.5 mmol/L (ref 3.5–5.2)
Sodium: 138 mmol/L (ref 134–144)
eGFR: 59 mL/min/{1.73_m2} — ABNORMAL LOW (ref >59)

## 2022-03-29 LAB — CBC
Hematocrit: 36.1 % (ref 34.0–46.6)
Hemoglobin: 12.5 g/dL (ref 11.1–15.9)
MCH: 29.4 pg (ref 26.6–33.0)
MCHC: 34.6 g/dL (ref 31.5–35.7)
MCV: 85 fL (ref 79–97)
Platelets: 295 10*3/uL (ref 150–450)
RBC: 4.25 x10E6/uL (ref 3.77–5.28)
RDW: 14.2 % (ref 11.7–15.4)
WBC: 8.1 10*3/uL (ref 3.4–10.8)

## 2022-04-01 NOTE — Progress Notes (Signed)
Subjective: 81 y.o. returns the office today for painful, elongated, thickened toenails which she cannot trim herself.  She previous had an ingrown toenail right big toe which is doing well with previously has noticed the nails become loose.  No purulence or increasing redness.  Occasional discomfort.  No other concerns.  PCP: Christain Sacramento, MD  Objective: AAO 3, NAD DP/PT pulses palpable, CRT less than 3 seconds.  Nails hypertrophic, dystrophic, elongated, brittle, discolored 10. There is tenderness overlying the nails 1-5 bilaterally.  The right hallux toenail is loose there is some clear drainage expressed under the toenail.  Small granular wound to the distal portion underneath the toenail.  Able to debride this back.  There is no purulence.  Trace edema.  No erythema or ascending cellulitis.  No open lesions or pre-ulcerative lesions are identified. No pain with calf compression, swelling, warmth, erythema.  Assessment: Patient presents with symptomatic onychomycosis; right hallux toenail infection  Plan: -Treatment options including alternatives, risks, complications were discussed -Nails sharply debrided 10 without complication/bleeding. -In particular the right hallux toenail there was some loose in the nail distally and some clear drainage.  Is able debride the nail back.  Also able debride some of the ingrowing portion of the nail that was present as well.  Prescribed doxycycline.  Recommend Epsom salt soaks.  Monitor for any signs or symptoms of worsening infection call the office medially should any occur or report of the emergency room.  Return in about 3 weeks (around 04/15/2022).  Celesta Gentile, DPM

## 2022-04-03 ENCOUNTER — Telehealth: Payer: Self-pay

## 2022-04-03 NOTE — Telephone Encounter (Addendum)
Called patient regarding results. Patient had understanding of results.----- Message from Lendon Colonel, NP sent at 03/29/2022  9:32 AM EDT ----- I have reviewed labs. Essentially normal with slight decrease in GFR No concerns here. Please send results to her PCP.   KL

## 2022-04-24 ENCOUNTER — Ambulatory Visit: Payer: Medicare HMO | Admitting: Podiatry

## 2022-04-24 DIAGNOSIS — L02612 Cutaneous abscess of left foot: Secondary | ICD-10-CM | POA: Diagnosis not present

## 2022-04-24 DIAGNOSIS — L03032 Cellulitis of left toe: Secondary | ICD-10-CM | POA: Diagnosis not present

## 2022-04-24 DIAGNOSIS — L600 Ingrowing nail: Secondary | ICD-10-CM

## 2022-04-27 NOTE — Progress Notes (Signed)
Subjective: 81 y.o. returns the office today for evaluation of ingrown toenail, infection of the right big toe.  She said that she is doing much better.  She does not notice any swelling or redness or drainage and she has no pain.  She finished the course of antibiotics.  No new concerns.  No fevers or chills.  PCP: Christain Sacramento, MD  Objective: AAO 3, NAD DP/PT pulses palpable, CRT less than 3 seconds.  There is a ingrown toenail right hallux there is currently no drainage.  The nail is loose with underlying nailbed distally but upon debridement there is no drainage or pus identified there is no ulceration.  There is no edema, erythema or signs of infection clinically. No pain with calf compression, swelling, warmth, erythema.  Assessment: Resolved infection right hallux  Plan: -Treatment options including alternatives, risks, complications were discussed -I further debrided right hallux nail with any complications or bleeding.  Continue antibiotic ointment dressing changes daily.  Monitor for any signs or symptoms of reoccurrence or infection. -As a courtesy I went ahead and debrided the nails today while she was here without any bleeding or complications.  Return in about 3 months (around 07/25/2022).  Trula Slade DPM

## 2022-06-02 ENCOUNTER — Other Ambulatory Visit: Payer: Self-pay | Admitting: Adult Health

## 2022-06-02 NOTE — Telephone Encounter (Signed)
Prescription refill request for Eliquis received. Indication: AF Last office visit: 03/28/22  Arnold Long NP Scr: 0.96 on 03/28/22 Age: 81 Weight: 81.9kg  Based on above findings Eliquis '5mg'$  twice daily is the appropriate dose.  Refill approved.

## 2022-06-18 ENCOUNTER — Other Ambulatory Visit: Payer: Self-pay | Admitting: Otolaryngology

## 2022-07-24 ENCOUNTER — Other Ambulatory Visit: Payer: Self-pay

## 2022-07-24 ENCOUNTER — Encounter (HOSPITAL_COMMUNITY): Payer: Self-pay | Admitting: Otolaryngology

## 2022-07-24 NOTE — Progress Notes (Signed)
Alisha Terrell denies chest pain or shortness of breath.  Patient denies having any s/s of Covid in her household, also denies any known exposure to Covid.   Alisha Terrell' PCP is Dr.Fred Redmond Pulling, cardiologist is Dr. Margaretann Loveless.

## 2022-07-25 ENCOUNTER — Encounter (HOSPITAL_COMMUNITY): Payer: Self-pay | Admitting: Otolaryngology

## 2022-07-25 ENCOUNTER — Ambulatory Visit (HOSPITAL_COMMUNITY)
Admission: RE | Admit: 2022-07-25 | Discharge: 2022-07-25 | Disposition: A | Discharge status: Home / Self Care | Payer: Medicare HMO | Source: Ambulatory Visit | Attending: Otolaryngology | Admitting: Otolaryngology

## 2022-07-25 ENCOUNTER — Encounter (HOSPITAL_COMMUNITY)
Admission: RE | Disposition: A | Discharge status: Home / Self Care | Payer: Self-pay | Source: Ambulatory Visit | Attending: Otolaryngology

## 2022-07-25 ENCOUNTER — Other Ambulatory Visit: Payer: Self-pay

## 2022-07-25 ENCOUNTER — Ambulatory Visit: Payer: Medicare HMO | Admitting: Podiatry

## 2022-07-25 ENCOUNTER — Ambulatory Visit (HOSPITAL_COMMUNITY): Payer: Medicare HMO | Admitting: Anesthesiology

## 2022-07-25 ENCOUNTER — Ambulatory Visit (HOSPITAL_BASED_OUTPATIENT_CLINIC_OR_DEPARTMENT_OTHER): Payer: Medicare HMO | Admitting: Anesthesiology

## 2022-07-25 ENCOUNTER — Other Ambulatory Visit (HOSPITAL_COMMUNITY): Payer: Self-pay

## 2022-07-25 DIAGNOSIS — K1370 Unspecified lesions of oral mucosa: Secondary | ICD-10-CM | POA: Diagnosis present

## 2022-07-25 DIAGNOSIS — K1379 Other lesions of oral mucosa: Secondary | ICD-10-CM | POA: Diagnosis not present

## 2022-07-25 DIAGNOSIS — G4733 Obstructive sleep apnea (adult) (pediatric): Secondary | ICD-10-CM | POA: Diagnosis not present

## 2022-07-25 DIAGNOSIS — D649 Anemia, unspecified: Secondary | ICD-10-CM | POA: Diagnosis not present

## 2022-07-25 DIAGNOSIS — I10 Essential (primary) hypertension: Secondary | ICD-10-CM

## 2022-07-25 DIAGNOSIS — I4891 Unspecified atrial fibrillation: Secondary | ICD-10-CM | POA: Insufficient documentation

## 2022-07-25 DIAGNOSIS — K121 Other forms of stomatitis: Secondary | ICD-10-CM | POA: Diagnosis not present

## 2022-07-25 DIAGNOSIS — K219 Gastro-esophageal reflux disease without esophagitis: Secondary | ICD-10-CM | POA: Diagnosis not present

## 2022-07-25 DIAGNOSIS — J449 Chronic obstructive pulmonary disease, unspecified: Secondary | ICD-10-CM | POA: Diagnosis not present

## 2022-07-25 DIAGNOSIS — I110 Hypertensive heart disease with heart failure: Secondary | ICD-10-CM | POA: Diagnosis not present

## 2022-07-25 DIAGNOSIS — I509 Heart failure, unspecified: Secondary | ICD-10-CM | POA: Insufficient documentation

## 2022-07-25 DIAGNOSIS — Z8673 Personal history of transient ischemic attack (TIA), and cerebral infarction without residual deficits: Secondary | ICD-10-CM | POA: Insufficient documentation

## 2022-07-25 DIAGNOSIS — Z7901 Long term (current) use of anticoagulants: Secondary | ICD-10-CM | POA: Insufficient documentation

## 2022-07-25 DIAGNOSIS — Z7722 Contact with and (suspected) exposure to environmental tobacco smoke (acute) (chronic): Secondary | ICD-10-CM | POA: Diagnosis not present

## 2022-07-25 HISTORY — DX: Acute kidney failure, unspecified: N17.9

## 2022-07-25 HISTORY — DX: Dyspnea, unspecified: R06.00

## 2022-07-25 HISTORY — DX: Heart failure, unspecified: I50.9

## 2022-07-25 HISTORY — DX: Gastro-esophageal reflux disease without esophagitis: K21.9

## 2022-07-25 HISTORY — DX: Headache, unspecified: R51.9

## 2022-07-25 HISTORY — PX: LESION EXCISION WITH COMPLEX REPAIR: SHX6700

## 2022-07-25 HISTORY — DX: Paroxysmal atrial fibrillation: I48.0

## 2022-07-25 LAB — CBC
HCT: 35.8 % — ABNORMAL LOW (ref 36.0–46.0)
Hemoglobin: 11.8 g/dL — ABNORMAL LOW (ref 12.0–15.0)
MCH: 29.7 pg (ref 26.0–34.0)
MCHC: 33 g/dL (ref 30.0–36.0)
MCV: 90.2 fL (ref 80.0–100.0)
Platelets: 280 10*3/uL (ref 150–400)
RBC: 3.97 MIL/uL (ref 3.87–5.11)
RDW: 14.2 % (ref 11.5–15.5)
WBC: 7.4 10*3/uL (ref 4.0–10.5)
nRBC: 0 % (ref 0.0–0.2)

## 2022-07-25 LAB — BASIC METABOLIC PANEL
Anion gap: 7 (ref 5–15)
BUN: 12 mg/dL (ref 8–23)
CO2: 23 mmol/L (ref 22–32)
Calcium: 8.8 mg/dL — ABNORMAL LOW (ref 8.9–10.3)
Chloride: 107 mmol/L (ref 98–111)
Creatinine, Ser: 0.97 mg/dL (ref 0.44–1.00)
GFR, Estimated: 58 mL/min — ABNORMAL LOW (ref >60)
Glucose, Bld: 116 mg/dL — ABNORMAL HIGH (ref 70–99)
Potassium: 4.2 mmol/L (ref 3.5–5.1)
Sodium: 137 mmol/L (ref 135–145)

## 2022-07-25 SURGERY — LESION EXCISION WITH COMPLEX REPAIR
Anesthesia: General | Site: Mouth | Laterality: Left

## 2022-07-25 MED ORDER — LACTATED RINGERS IV SOLN: INTRAVENOUS | Status: DC

## 2022-07-25 MED ORDER — ORAL CARE MOUTH RINSE: 15.0000 mL | Freq: Once | OROMUCOSAL | Status: AC

## 2022-07-25 MED ORDER — ARTIFICIAL TEARS OPHTHALMIC OINT
TOPICAL_OINTMENT | OPHTHALMIC | Status: AC
  Filled 2022-07-25: qty 3.5

## 2022-07-25 MED ORDER — DEXAMETHASONE SODIUM PHOSPHATE 10 MG/ML IJ SOLN
INTRAMUSCULAR | Status: AC
  Filled 2022-07-25: qty 1

## 2022-07-25 MED ORDER — PROPOFOL 10 MG/ML IV BOLUS
INTRAVENOUS | Status: AC
  Filled 2022-07-25: qty 20

## 2022-07-25 MED ORDER — EPINEPHRINE PF 1 MG/ML IJ SOLN
INTRAMUSCULAR | Status: DC | PRN
  Administered 2022-07-25: 1 mg

## 2022-07-25 MED ORDER — EPINEPHRINE PF 1 MG/ML IJ SOLN
INTRAMUSCULAR | Status: AC
  Filled 2022-07-25: qty 1

## 2022-07-25 MED ORDER — GLYCOPYRROLATE PF 0.2 MG/ML IJ SOSY
PREFILLED_SYRINGE | INTRAMUSCULAR | Status: DC | PRN
  Administered 2022-07-25: .2 mg via INTRAVENOUS

## 2022-07-25 MED ORDER — DEXAMETHASONE SODIUM PHOSPHATE 10 MG/ML IJ SOLN
INTRAMUSCULAR | Status: DC | PRN
  Administered 2022-07-25: 5 mg via INTRAVENOUS

## 2022-07-25 MED ORDER — ROCURONIUM BROMIDE 10 MG/ML (PF) SYRINGE
PREFILLED_SYRINGE | INTRAVENOUS | Status: DC | PRN
  Administered 2022-07-25: 40 mg via INTRAVENOUS

## 2022-07-25 MED ORDER — FENTANYL CITRATE (PF) 250 MCG/5ML IJ SOLN
INTRAMUSCULAR | Status: AC
  Filled 2022-07-25: qty 5

## 2022-07-25 MED ORDER — LIDOCAINE 2% (20 MG/ML) 5 ML SYRINGE
INTRAMUSCULAR | Status: AC
  Filled 2022-07-25: qty 5

## 2022-07-25 MED ORDER — LIDOCAINE-EPINEPHRINE 1 %-1:100000 IJ SOLN
INTRAMUSCULAR | Status: AC
  Filled 2022-07-25: qty 1

## 2022-07-25 MED ORDER — CHLORHEXIDINE GLUCONATE 0.12 % MT SOLN
15.0000 mL | Freq: Once | OROMUCOSAL | Status: AC
  Administered 2022-07-25: 15 mL via OROMUCOSAL
  Filled 2022-07-25: qty 15

## 2022-07-25 MED ORDER — LIDOCAINE VISCOUS HCL 2 % MT SOLN
15.0000 mL | Freq: Four times a day (QID) | OROMUCOSAL | 0 refills | Status: AC | PRN
  Filled 2022-07-25: qty 300, 5d supply, fill #0

## 2022-07-25 MED ORDER — PROPOFOL 10 MG/ML IV BOLUS
INTRAVENOUS | Status: DC | PRN
  Administered 2022-07-25: 100 mg via INTRAVENOUS
  Administered 2022-07-25: 30 mg via INTRAVENOUS

## 2022-07-25 MED ORDER — ONDANSETRON HCL 4 MG/2ML IJ SOLN
INTRAMUSCULAR | Status: DC | PRN
  Administered 2022-07-25: 4 mg via INTRAVENOUS

## 2022-07-25 MED ORDER — ONDANSETRON HCL 4 MG/2ML IJ SOLN
INTRAMUSCULAR | Status: AC
  Filled 2022-07-25: qty 2

## 2022-07-25 MED ORDER — CHLORHEXIDINE GLUCONATE 0.12 % MT SOLN
15.0000 mL | Freq: Three times a day (TID) | OROMUCOSAL | 0 refills | Status: AC
  Filled 2022-07-25: qty 473, 11d supply, fill #0

## 2022-07-25 MED ORDER — LIDOCAINE 2% (20 MG/ML) 5 ML SYRINGE
INTRAMUSCULAR | Status: DC | PRN
  Administered 2022-07-25: 60 mg via INTRAVENOUS

## 2022-07-25 MED ORDER — GLYCOPYRROLATE PF 0.2 MG/ML IJ SOSY
PREFILLED_SYRINGE | INTRAMUSCULAR | Status: AC
  Filled 2022-07-25: qty 1

## 2022-07-25 MED ORDER — ROCURONIUM BROMIDE 10 MG/ML (PF) SYRINGE
PREFILLED_SYRINGE | INTRAVENOUS | Status: AC
  Filled 2022-07-25: qty 10

## 2022-07-25 MED ORDER — FENTANYL CITRATE (PF) 250 MCG/5ML IJ SOLN
INTRAMUSCULAR | Status: DC | PRN
  Administered 2022-07-25 (×4): 50 ug via INTRAVENOUS

## 2022-07-25 MED ORDER — SUGAMMADEX SODIUM 200 MG/2ML IV SOLN
INTRAVENOUS | Status: DC | PRN
  Administered 2022-07-25: 200 mg via INTRAVENOUS

## 2022-07-25 MED ORDER — 0.9 % SODIUM CHLORIDE (POUR BTL) OPTIME
TOPICAL | Status: DC | PRN
  Administered 2022-07-25: 1000 mL

## 2022-07-25 MED ORDER — LIDOCAINE-EPINEPHRINE 1 %-1:100000 IJ SOLN
INTRAMUSCULAR | Status: DC | PRN
  Administered 2022-07-25: 8 mL

## 2022-07-25 SURGICAL SUPPLY — 51 items
APPLIER CLIP 9.375 MED OPEN (MISCELLANEOUS)
APR CLP MED 9.3 20 MLT OPN (MISCELLANEOUS)
BAG COUNTER SPONGE SURGICOUNT (BAG) ×1 IMPLANT
BAG SPNG CNTER NS LX DISP (BAG) ×1
BLADE SURG 15 STRL LF DISP TIS (BLADE) IMPLANT
BLADE SURG 15 STRL SS (BLADE)
CANISTER SUCT 3000ML PPV (MISCELLANEOUS) ×1 IMPLANT
CLEANER TIP ELECTROSURG 2X2 (MISCELLANEOUS) ×1 IMPLANT
CLIP APPLIE 9.375 MED OPEN (MISCELLANEOUS) IMPLANT
CNTNR URN SCR LID CUP LEK RST (MISCELLANEOUS) ×1 IMPLANT
CONT SPEC 4OZ STRL OR WHT (MISCELLANEOUS) ×1
CORD BIPOLAR FORCEPS 12FT (ELECTRODE) ×1 IMPLANT
COVER SURGICAL LIGHT HANDLE (MISCELLANEOUS) ×1 IMPLANT
ELECT COATED BLADE 2.86 ST (ELECTRODE) IMPLANT
ELECT NDL BLADE 2-5/6 (NEEDLE) ×1 IMPLANT
ELECT NEEDLE BLADE 2-5/6 (NEEDLE) ×1 IMPLANT
ELECT REM PT RETURN 9FT ADLT (ELECTROSURGICAL) ×1
ELECTRODE REM PT RTRN 9FT ADLT (ELECTROSURGICAL) ×1 IMPLANT
FORCEPS BIPOLAR SPETZLER 8 1.0 (NEUROSURGERY SUPPLIES) ×1 IMPLANT
GAUZE 4X4 16PLY ~~LOC~~+RFID DBL (SPONGE) ×1 IMPLANT
GAUZE SPONGE 4X4 12PLY STRL (GAUZE/BANDAGES/DRESSINGS) IMPLANT
GLOVE ECLIPSE 7.5 STRL STRAW (GLOVE) ×1 IMPLANT
GLOVE SURG LTX SZ6.5 (GLOVE) ×1 IMPLANT
GOWN STRL REUS W/ TWL LRG LVL3 (GOWN DISPOSABLE) ×2 IMPLANT
GOWN STRL REUS W/TWL LRG LVL3 (GOWN DISPOSABLE) ×2
KIT BASIN OR (CUSTOM PROCEDURE TRAY) ×1 IMPLANT
KIT TURNOVER KIT B (KITS) ×1 IMPLANT
LOCATOR NERVE 3 VOLT (DISPOSABLE) IMPLANT
NDL HYPO 25GX1X1/2 BEV (NEEDLE) ×1 IMPLANT
NEEDLE HYPO 25GX1X1/2 BEV (NEEDLE) ×1 IMPLANT
NS IRRIG 1000ML POUR BTL (IV SOLUTION) ×1 IMPLANT
PAD ARMBOARD 7.5X6 YLW CONV (MISCELLANEOUS) ×1 IMPLANT
POSITIONER HEAD DONUT 9IN (MISCELLANEOUS) ×1 IMPLANT
SPONGE INTESTINAL PEANUT (DISPOSABLE) IMPLANT
SPONGE T-LAP 18X18 ~~LOC~~+RFID (SPONGE) ×1 IMPLANT
SURGILUBE 2OZ TUBE FLIPTOP (MISCELLANEOUS) IMPLANT
SUT CHROMIC 3 0 PS 2 (SUTURE) ×3 IMPLANT
SUT CHROMIC 4 0 PS 2 18 (SUTURE) ×1 IMPLANT
SUT SILK 2 0 PERMA HAND 18 BK (SUTURE) ×1 IMPLANT
SUT SILK 2 0 SH (SUTURE) IMPLANT
SUT SILK 3 0 REEL (SUTURE) IMPLANT
SUT SILK 3 0 SH CR/8 (SUTURE) IMPLANT
SUT SILK 4 0 REEL (SUTURE) IMPLANT
SUT VIC AB 3-0 SH 27 (SUTURE)
SUT VIC AB 3-0 SH 27XBRD (SUTURE) IMPLANT
SUT VIC AB 4-0 RB1 18 (SUTURE) ×1 IMPLANT
SUT VIC AB 4-0 SH 27 (SUTURE) ×1
SUT VIC AB 4-0 SH 27XANBCTRL (SUTURE) IMPLANT
TOWEL GREEN STERILE FF (TOWEL DISPOSABLE) ×1 IMPLANT
TRAY ENT MC OR (CUSTOM PROCEDURE TRAY) ×1 IMPLANT
WATER STERILE IRR 1000ML POUR (IV SOLUTION) ×1 IMPLANT

## 2022-07-25 NOTE — H&P (Signed)
Alisha Terrell is an 81 y.o. female.    Chief Complaint:  Lesion of left buccal mucosa  HPI: Patient presents today for planned elective procedure.  She denies any interval change in history since office visit on 04/16/2022:  Alisha Terrell is a 81 y.o. female who presents as a new consult, referred by Nicki Reaper Minor, DDS for evaluation and treatment of lesion of left buccal mucosa. Patient states that this has been present for some time. She was referred to ENT by her dentist after the lesion was noted on routine examination. She denies any significant pain in association with the lesion, but states that it occasionally can be sore. She denies any significant increase in size. Patient is a non-smoker with no tobacco use history. She is chronically anticoagulated on Eliquis for atrial fibrillation.   Past Medical History:  Diagnosis Date   ARF (acute renal failure) ([facility])    2020  resolved  it was presrnt when she was septic   Arthritis    Asthma    CHF (congestive heart failure) ([facility])    Chronic back pain    Class 2 obesity due to excess calories with body mass index (BMI) of 35.0 to 35.9 in adult    Dyspnea    ocassional   GERD (gastroesophageal reflux disease)    Headache    Hypertension    OSA (obstructive sleep apnea)    PAF (paroxysmal atrial fibrillation) ([facility])    TIA (transient ischemic attack) 2018   no resedule    Past Surgical History:  Procedure Laterality Date   ABDOMINAL SURGERY     CESAREAN SECTION     CHOLECYSTECTOMY      Family History  Problem Relation Age of Onset   Hypertension Mother 22   Pneumonia Father 45    Social History:  reports that she has never smoked. She has been exposed to tobacco smoke. She has never used smokeless tobacco. She reports that she does not currently use alcohol after a past usage of about 3.0 standard drinks of alcohol per week. She reports that she does not use drugs.  Allergies:  Allergies  Allergen Reactions   Codeine  Rash and Other (See Comments)    Agitation, bad dreams   Omnicef [Cefdinir] Nausea And Vomiting    Confirmed with patient at bedside that no rash - just had significant GI effects    Medications Prior to Admission  Medication Sig Dispense Refill   acetaminophen (TYLENOL) 500 MG tablet Take 500 mg by mouth every 6 (six) hours as needed. Two Tablets Every 6 hours as Needed For Pain     ADVAIR DISKUS 250-50 MCG/DOSE AEPB Inhale 1 puff into the lungs 2 (two) times daily.      apixaban (ELIQUIS) 5 MG TABS tablet TAKE 1 TABLET BY MOUTH TWICE A DAY 60 tablet 5   aspirin EC 81 MG tablet Take 81 mg by mouth daily.     Azelastine HCl 137 MCG/SPRAY SOLN Place 1 spray into both nostrils daily.     Dextromethorphan-Guaifenesin (ROBITUSSIN COUGH/CHEST DM MAX PO) Take 1 mL by mouth 2 (two) times daily as needed (for cold symptoms).      diclofenac Sodium (VOLTAREN) 1 % GEL Apply 2 g topically at bedtime.     diltiazem (CARDIZEM CD) 240 MG 24 hr capsule Take 1 capsule (240 mg total) by mouth daily. 30 capsule 3   fluticasone (FLONASE) 50 MCG/ACT nasal spray Place 1 spray into both nostrils at bedtime.  furosemide (LASIX) 20 MG tablet Take 1 tablet (20 mg total) by mouth daily as needed for fluid. 30 tablet 3   ibandronate (BONIVA) 150 MG tablet Take 150 mg by mouth every 30 (thirty) days.      lidocaine (LMX) 4 % cream Apply 1 Application topically as needed (back pain).     losartan (COZAAR) 100 MG tablet Take 1 tablet (100 mg total) by mouth daily. 30 tablet 3   metoprolol tartrate (LOPRESSOR) 50 MG tablet Take 1 tablet (50 mg total) by mouth 2 (two) times daily. 60 tablet 3   montelukast (SINGULAIR) 10 MG tablet Take 10 mg by mouth at bedtime.     Multiple Vitamins-Minerals (CENTRUM SILVER ADULT 50+ PO) Take 1 tablet by mouth daily.     omeprazole (PRILOSEC) 40 MG capsule Take 40 mg by mouth in the morning and at bedtime.      PROAIR HFA 108 (90 Base) MCG/ACT inhaler Inhale 1-2 puffs into the lungs  every 6 (six) hours as needed for wheezing or shortness of breath.      traMADol (ULTRAM) 50 MG tablet Take 50 mg by mouth every 6 (six) hours as needed for moderate pain or severe pain.     doxycycline (VIBRA-TABS) 100 MG tablet Take 1 tablet (100 mg total) by mouth 2 (two) times daily. (Patient not taking: Reported on 07/21/2022) 14 tablet 01   ondansetron (ZOFRAN-ODT) 4 MG disintegrating tablet Take 4 mg by mouth every 8 (eight) hours as needed for vomiting or nausea.      Results for orders placed or performed during the hospital encounter of 07/25/22 (from the past 48 hour(s))  CBC per protocol     Status: Abnormal   Collection Time: 07/25/22 11:46 AM  Result Value Ref Range   WBC 7.4 4.0 - 10.5 K/uL   RBC 3.97 3.87 - 5.11 MIL/uL   Hemoglobin 11.8 (L) 12.0 - 15.0 g/dL   HCT 35.8 (L) 36.0 - 46.0 %   MCV 90.2 80.0 - 100.0 fL   MCH 29.7 26.0 - 34.0 pg   MCHC 33.0 30.0 - 36.0 g/dL   RDW 14.2 11.5 - 15.5 %   Platelets 280 150 - 400 K/uL   nRBC 0.0 0.0 - 0.2 %    Comment: Performed at [facility], [place] [address] [place], [place] [address]  Basic metabolic panel per protocol     Status: Abnormal   Collection Time: 07/25/22 11:46 AM  Result Value Ref Range   Sodium 137 135 - 145 mmol/L   Potassium 4.2 3.5 - 5.1 mmol/L   Chloride 107 98 - 111 mmol/L   CO2 23 22 - 32 mmol/L   Glucose, Bld 116 (H) 70 - 99 mg/dL    Comment: Glucose reference range applies only to samples taken after fasting for at least 8 hours.   BUN 12 8 - 23 mg/dL   Creatinine, Ser 0.97 0.44 - 1.00 mg/dL   Calcium 8.8 (L) 8.9 - 10.3 mg/dL   GFR, Estimated 58 (L) >60 mL/min    Comment: (NOTE) Calculated using the CKD-EPI Creatinine Equation (2021)    Anion gap 7 5 - 15    Comment: Performed at [facility] [address] [place], [place] [address]   No results found.  ROS: ROS  Blood pressure (!) 150/81, pulse 67, temperature 98 F (36.7 C), temperature source Oral, resp. rate 20, height '4\' 11"'$   (1.499 m), weight 78.5 kg, SpO2 97 %.  PHYSICAL EXAM: Physical Exam Constitutional:      Appearance: Normal appearance.  HENT:     Head: Normocephalic and atraumatic.     Right Ear: External ear normal.     Left Ear: External ear normal.     Mouth/Throat:     Mouth: Mucous membranes are moist.  Pulmonary:     Effort: Pulmonary effort is normal.  Musculoskeletal:     Cervical back: Neck supple.  Neurological:     General: No focal deficit present.     Mental Status: She is alert. Mental status is at baseline.  Psychiatric:        Mood and Affect: Mood normal.        Behavior: Behavior normal.     Studies Reviewed: None   Assessment/Plan Madysun Thall is a 81 y.o. female with history of atrial fibrillation, chronically anticoagulated on Eliquis with exophytic lesion of left buccal mucosa.  -To OR today for excisional biopsy. Risks, benefits, alternatives of procedure, as well as expected postoperative course and recovery were reviewed with patient, who expressed understanding and agreement. All questions answered.     Haileyann Staiger A Carlesha Seiple 07/25/2022, 2:29 PM

## 2022-07-25 NOTE — Transfer of Care (Signed)
Immediate Anesthesia Transfer of Care Note  Patient: Alisha Terrell  Procedure(s) Performed: LESION EXCISION OF LEFT BUCCAL MUCOSA (Left: Mouth)  Patient Location: PACU  Anesthesia Type:General  Level of Consciousness: awake and alert   Airway & Oxygen Therapy: Patient Spontanous Breathing  Post-op Assessment: Report given to RN and Post -op Vital signs reviewed and stable  Post vital signs: Reviewed and stable  Last Vitals:  Vitals Value Taken Time  BP 145/80 07/25/22 1537  Temp 36.4 C 07/25/22 1536  Pulse 76 07/25/22 1541  Resp 12 07/25/22 1541  SpO2 94 % 07/25/22 1541  Vitals shown include unvalidated device data.  Last Pain:  Vitals:   07/25/22 1536  TempSrc:   PainSc: 8          Complications: No notable events documented.

## 2022-07-25 NOTE — Anesthesia Postprocedure Evaluation (Signed)
Anesthesia Post Note  Patient: Alisha Terrell  Procedure(s) Performed: LESION EXCISION OF LEFT BUCCAL MUCOSA (Left: Mouth)     Patient location during evaluation: PACU Anesthesia Type: General Level of consciousness: sedated Pain management: pain level controlled Vital Signs Assessment: post-procedure vital signs reviewed and stable Respiratory status: spontaneous breathing and respiratory function stable Cardiovascular status: stable Postop Assessment: no apparent nausea or vomiting Anesthetic complications: no   No notable events documented.  Last Vitals:  Vitals:   07/25/22 1551 07/25/22 1606  BP: 123/68 (!) 149/77  Pulse: 78 78  Resp: 17 18  Temp:  36.7 C  SpO2: 95% 94%    Last Pain:  Vitals:   07/25/22 1606  TempSrc:   PainSc: 3                  Armanda Forand DANIEL

## 2022-07-25 NOTE — Anesthesia Preprocedure Evaluation (Addendum)
Anesthesia Evaluation  Patient identified by MRN, date of birth, ID band Patient awake    Reviewed: Allergy & Precautions, NPO status , Patient's Chart, lab work & pertinent test results  History of Anesthesia Complications Negative for: history of anesthetic complications  Airway Mallampati: II  TM Distance: >3 FB Neck ROM: Full    Dental no notable dental hx. (+) Dental Advisory Given   Pulmonary sleep apnea , COPD,    Pulmonary exam normal        Cardiovascular hypertension, Normal cardiovascular exam+ dysrhythmias Atrial Fibrillation      Neuro/Psych TIA   GI/Hepatic Neg liver ROS, GERD  ,  Endo/Other  negative endocrine ROS  Renal/GU negative Renal ROS     Musculoskeletal negative musculoskeletal ROS (+)   Abdominal   Peds  Hematology  (+) Blood dyscrasia, anemia ,   Anesthesia Other Findings   Reproductive/Obstetrics                            Anesthesia Physical Anesthesia Plan  ASA: 3  Anesthesia Plan: General   Post-op Pain Management: Tylenol PO (pre-op)*   Induction: Intravenous  PONV Risk Score and Plan: 3 and Ondansetron, Dexamethasone and Treatment may vary due to age or medical condition  Airway Management Planned: Oral ETT  Additional Equipment:   Intra-op Plan:   Post-operative Plan: Extubation in OR  Informed Consent: I have reviewed the patients History and Physical, chart, labs and discussed the procedure including the risks, benefits and alternatives for the proposed anesthesia with the patient or authorized representative who has indicated his/her understanding and acceptance.     Dental advisory given  Plan Discussed with: Anesthesiologist and CRNA  Anesthesia Plan Comments:        Anesthesia Quick Evaluation

## 2022-07-25 NOTE — Op Note (Signed)
OPERATIVE NOTE  Alisha Terrell Date/Time of Admission: 07/25/2022 10:58 AM  CSN: [id number] Attending Provider: Ebbie Latus A, DO Room/Bed: MCPO/NONE DOB: 1941-08-04 Age: 81 y.o.   Pre-Op Diagnosis: Lesion of buccal mucosa Chronic anticoagulation  Post-Op Diagnosis: Lesion of buccal mucosa  Procedure: Procedure(s): EXCISION OF LESION OF LEFT BUCCAL MUCOSA WITH COMPLEX REPAIR (95621)  Anesthesia: General  Surgeon(s): [place], DO  Staff: Circulator: Margy Clarks, RN; Rometta Emery, RN Scrub Person: Amedeo Plenty, Amy E  Implants: * No implants in log *  Specimens: ID Type Source Tests Collected by Time Destination  1 : Left buccal mucosa lesion Tissue PATH ENT excision SURGICAL PATHOLOGY [name], DO 30/86/5784 [id number]     Complications: None  EBL: <5 ML  Condition: stable  Operative Findings:  Exophytic lesion of left buccal mucosa measuring 2cm in length and 1 cm in height, inferior and posterior to Stenson's duct  Description of Operation: Once the operative consent was obtained and the site and surgery were confirmed with the patient and the operating room team, the patient was brought back to the operating room and general endotracheal anesthesia was obtained. Local anesthesia, 1% lidocaine with 1:100,000 epinephrine was injected into the lesion, 8 mL total. The patient was prepped and draped in normal sterile fashion and turned over to the ENT service. A bite block and sweetheart retractor were used to provide exposure of the lesion of concern. The lesion was identified and an ellipse was planned with 2 mm margins. The total planned defect was 2.5 x 1.5 cm.  The lesion was excised with a Bovie and margins were marked. The lesion was sent to pathology as a permanent specimen. The mucosa surrounding the defect was undermined to facilitate closure. Hemostasis was obtained with bipolar electrocautery.  The defect was then  closed in a layered fashion with interrupted 4-0 Vicryl sutures in the deep tissues and 4-0 chromic in a running locking fashion in the mucosa. An orogastric tube was then placed to suction out the gastric contents and reduce postoperative nausea.  The patient was turned over to the anesthesia service and extubated. The patient was then transferred to the PACU in stable condition.    Jason Coop, [facility]  07/25/2022

## 2022-07-25 NOTE — Anesthesia Procedure Notes (Signed)
Procedure Name: Intubation Date/Time: 07/25/2022 2:46 PM  Performed by: Reece Agar, CRNAPre-anesthesia Checklist: Patient identified, Emergency Drugs available, Suction available and Patient being monitored Patient Re-evaluated:Patient Re-evaluated prior to induction Oxygen Delivery Method: Circle System Utilized Preoxygenation: Pre-oxygenation with 100% oxygen Induction Type: IV induction Ventilation: Mask ventilation without difficulty Laryngoscope Size: Mac and 3 Grade View: Grade I Tube type: Oral Tube size: 7.0 mm Number of attempts: 1 Airway Equipment and Method: Stylet Placement Confirmation: ETT inserted through vocal cords under direct vision, positive ETCO2 and breath sounds checked- equal and bilateral Secured at: 20 cm Tube secured with: Tape Dental Injury: Teeth and Oropharynx as per pre-operative assessment

## 2022-07-25 NOTE — Discharge Instructions (Addendum)
Peridex swish and spit three times a day after eating Lidocaine swish and spit every 6 hours as needed Follow a soft/pureed diet for the next 7 days to avoid accidentally biting the area while it is healing OK to resume eliquis on POD #2 or as per cardiology recommendations  Call [facility] with any questions

## 2022-07-26 ENCOUNTER — Encounter (HOSPITAL_COMMUNITY): Payer: Self-pay | Admitting: Otolaryngology

## 2022-07-28 LAB — SURGICAL PATHOLOGY

## 2022-08-07 ENCOUNTER — Ambulatory Visit: Payer: Medicare HMO | Admitting: Podiatry

## 2022-08-07 DIAGNOSIS — M79674 Pain in right toe(s): Secondary | ICD-10-CM

## 2022-08-07 DIAGNOSIS — M79675 Pain in left toe(s): Secondary | ICD-10-CM

## 2022-08-07 DIAGNOSIS — Z7901 Long term (current) use of anticoagulants: Secondary | ICD-10-CM | POA: Diagnosis not present

## 2022-08-07 DIAGNOSIS — L84 Corns and callosities: Secondary | ICD-10-CM | POA: Diagnosis not present

## 2022-08-07 DIAGNOSIS — B351 Tinea unguium: Secondary | ICD-10-CM | POA: Diagnosis not present

## 2022-08-10 NOTE — Progress Notes (Signed)
Subjective: Chief Complaint  Patient presents with   Nail Problem    Routine foot care, nail trim     81 y.o. returns the office today for thick, elongated toenails that she is not able to trim herself.  No open lesions or reports.  She states that she has been doing well.  She is on Eliquis  PCP: Christain Sacramento, MD  Objective: AAO 3, NAD DP/PT pulses palpable, CRT less than 3 seconds.  Nails are hypertrophic, dystrophic, brittle, discolored, elongated 10. No surrounding redness or drainage. Tenderness nails 1-5 bilaterally. No open lesions or pre-ulcerative lesions are identified today.  Incurvation of the hallux nail border.  There is no signs of infection or pain associated with ingrown toenail today. The color of the second toe where she had the infection previously has returned back to normal.  There is minimal callus formation the distal aspect left second toe and there is no ulceration or signs of infection. No pain with calf compression, swelling, warmth, erythema.  Assessment: Symptomatic onychosis preulcerative callus left second toe  Plan: -Treatment options including alternatives, risks, complications were discussed -Sharply debrided the nails x10 without any complications or bleeding. -Sharply debrided the callus on the left second toe there are any complications or bleeding.  Continue offloading.  Return in about 3 months (around 11/07/2022).  Trula Slade DPM

## 2022-09-23 ENCOUNTER — Other Ambulatory Visit: Payer: Self-pay | Admitting: Family Medicine

## 2022-09-23 DIAGNOSIS — E041 Nontoxic single thyroid nodule: Secondary | ICD-10-CM

## 2022-10-01 ENCOUNTER — Other Ambulatory Visit: Payer: Medicare HMO

## 2022-10-02 ENCOUNTER — Ambulatory Visit: Payer: Medicare HMO | Attending: Internal Medicine | Admitting: Internal Medicine

## 2022-10-02 ENCOUNTER — Encounter: Payer: Self-pay | Admitting: Internal Medicine

## 2022-10-02 VITALS — BP 142/87 | HR 84 | Ht <= 58 in | Wt 176.6 lb

## 2022-10-02 DIAGNOSIS — I5032 Chronic diastolic (congestive) heart failure: Secondary | ICD-10-CM

## 2022-10-02 DIAGNOSIS — I10 Essential (primary) hypertension: Secondary | ICD-10-CM | POA: Diagnosis not present

## 2022-10-02 DIAGNOSIS — I480 Paroxysmal atrial fibrillation: Secondary | ICD-10-CM | POA: Diagnosis not present

## 2022-10-02 DIAGNOSIS — D6869 Other thrombophilia: Secondary | ICD-10-CM | POA: Diagnosis not present

## 2022-10-02 DIAGNOSIS — G4733 Obstructive sleep apnea (adult) (pediatric): Secondary | ICD-10-CM

## 2022-10-02 DIAGNOSIS — Z79899 Other long term (current) drug therapy: Secondary | ICD-10-CM

## 2022-10-02 MED ORDER — AMIODARONE HCL 200 MG PO TABS: 200.0000 mg | ORAL_TABLET | Freq: Every day | ORAL | 1 refills | Status: DC

## 2022-10-02 NOTE — Progress Notes (Signed)
Cardiology Office Note:    Date:  10/02/2022   ID:  Alisha Terrell, DOB February 02, 1941, MRN [id number]  PCP:  Christain Sacramento, MD  Cardiologist:  Elouise Munroe, MD  Electrophysiologist:  None   Referring MD: Christain Sacramento, MD   Chief Complaint/Reason for Referral: Atrial fibrillation  History of Present Illness:    Alisha Terrell is a 81 y.o. female with a history of HTN, asthma, TIA, OSA on CPAP,obesity, chronic back pain, who presented to the ER 03/06/2021 with c/o weakness, nausea, vomiting, palpitation, and nothing to eat and drink for 3 days, found to have sepsis with lactic acidosis >2, 2/2 presumed toe blister with infection, AKI, and new onset A. fib with RVR with a CHA2DS2-VASc score of 5 (hypertension, age x2, TIA x2).  I saw her while she was in the hospital, where we titrated beta-blocker and amiodarone.  Plan was initially for cardioversion however she converted while on amiodarone, and fortunately her sepsis improved and resolved.  She follows with podiatry who closely monitors her feet.    During her last visit, she was doing overall well. She continued to have primarily arthritic concerns, but otherwise had no cardiovascular complaints.  She did not feel she had recurrent atrial fibrillation and ECG was sinus rhythm.  Continues with apixaban 5 mg twice daily, she had no bleeding.  We stopped amiodarone at last visit given continued SR.  Today, she reports having an episode of lightheadedness last week. She called the ambulance after her symptoms would not resolve. She attributes this episode to her being in atrial fibrillation. She reports that she does not feel it as much now. She has been compliantly taking her lasix.  She also reports experiencing shortness of breath and notes that it may be attributed to having asthma.   She reports that she has been having trouble with major life stressors with her husbands health. She has had chronic back pain that she for about  25 years improved slightly with her husband receiving care in a rehab facility.  She denies any palpitations, chest pain, or peripheral edema. No headaches, syncope, orthopnea, or PND.   Past Medical History:  Diagnosis Date   ARF (acute renal failure) ([place])    2020  resolved  it was presrnt when she was septic   Arthritis    Asthma    CHF (congestive heart failure) (HCC)    Chronic back pain    Class 2 obesity due to excess calories with body mass index (BMI) of 35.0 to 35.9 in adult    Dyspnea    ocassional   GERD (gastroesophageal reflux disease)    Headache    Hypertension    OSA (obstructive sleep apnea)    PAF (paroxysmal atrial fibrillation) (HCC)    TIA (transient ischemic attack) 2018   no resedule    Past Surgical History:  Procedure Laterality Date   ABDOMINAL SURGERY     CESAREAN SECTION     CHOLECYSTECTOMY     LESION EXCISION WITH COMPLEX REPAIR Left 07/25/2022   Procedure: LESION EXCISION OF LEFT BUCCAL MUCOSA;  Surgeon: Jason Coop, DO;  Location: [facility] OR;  Service: ENT;  Laterality: Left;    Current Medications: Current Meds  Medication Sig   acetaminophen (TYLENOL) 500 MG tablet Take 500 mg by mouth every 6 (six) hours as needed. Two Tablets Every 6 hours as Needed For Pain   ADVAIR DISKUS 250-50 MCG/DOSE AEPB Inhale 1 puff into the lungs  2 (two) times daily.    amiodarone (PACERONE) 200 MG tablet Take 1 tablet (200 mg total) by mouth daily. TAKE '400mg'$  TWICE DAILY FOR 6 DAYS. THEN REDUCE DOWN TO '200mg'$  ONCE DAILY THEREAFTER   apixaban (ELIQUIS) 5 MG TABS tablet TAKE 1 TABLET BY MOUTH TWICE A DAY   aspirin EC 81 MG tablet Take 81 mg by mouth daily.   Azelastine HCl 137 MCG/SPRAY SOLN Place 1 spray into both nostrils daily.   Dextromethorphan-Guaifenesin (ROBITUSSIN COUGH/CHEST DM MAX PO) Take 1 mL by mouth 2 (two) times daily as needed (for cold symptoms).    diclofenac Sodium (VOLTAREN) 1 % GEL Apply 2 g topically at bedtime.   diltiazem (CARDIZEM  CD) 240 MG 24 hr capsule Take 1 capsule (240 mg total) by mouth daily.   doxycycline (VIBRA-TABS) 100 MG tablet Take 1 tablet (100 mg total) by mouth 2 (two) times daily.   fluticasone (FLONASE) 50 MCG/ACT nasal spray Place 1 spray into both nostrils at bedtime.    furosemide (LASIX) 20 MG tablet Take 1 tablet (20 mg total) by mouth daily as needed for fluid.   ibandronate (BONIVA) 150 MG tablet Take 150 mg by mouth every 30 (thirty) days.    lidocaine (LMX) 4 % cream Apply 1 Application topically as needed (back pain).   losartan (COZAAR) 100 MG tablet Take 1 tablet (100 mg total) by mouth daily.   metoprolol tartrate (LOPRESSOR) 50 MG tablet Take 1 tablet (50 mg total) by mouth 2 (two) times daily.   montelukast (SINGULAIR) 10 MG tablet Take 10 mg by mouth at bedtime.   Multiple Vitamins-Minerals (CENTRUM SILVER ADULT 50+ PO) Take 1 tablet by mouth daily.   omeprazole (PRILOSEC) 40 MG capsule Take 40 mg by mouth in the morning and at bedtime.    ondansetron (ZOFRAN-ODT) 4 MG disintegrating tablet Take 4 mg by mouth every 8 (eight) hours as needed for vomiting or nausea.   PROAIR HFA 108 (90 Base) MCG/ACT inhaler Inhale 1-2 puffs into the lungs every 6 (six) hours as needed for wheezing or shortness of breath.    traMADol (ULTRAM) 50 MG tablet Take 50 mg by mouth every 6 (six) hours as needed for moderate pain or severe pain.     Allergies:   Codeine and Omnicef [cefdinir]   Social History   Tobacco Use   Smoking status: Never    Passive exposure: Yes   Smokeless tobacco: Never  Vaping Use   Vaping Use: Never used  Substance Use Topics   Alcohol use: Not Currently    Alcohol/week: 3.0 standard drinks of alcohol    Types: 3 Glasses of wine per week   Drug use: No     Family History: The patient's family history includes Hypertension (age of onset: 34) in her mother; Pneumonia (age of onset: 77) in her father.  ROS:   Please see the history of present illness.    (+)  Lightheadedness (+) Shortness of breath All other systems reviewed and are negative.  EKGs/Labs/Other Studies Reviewed:    The following studies were reviewed today:  LE Doppler 03/08/2021: Summary:  Right: Resting right ankle-brachial index is within normal range. No  evidence of significant right lower extremity arterial disease. The right  toe-brachial index is normal.   Left: Resting left ankle-brachial index is within normal range. No  evidence of significant left lower extremity arterial disease. The left  toe-brachial index is abnormal.   Echo 03/08/2021: IMPRESSIONS   1. Left ventricular ejection  fraction, by estimation, is 55 to 60%. The  left ventricle has normal function. The left ventricle has no regional  wall motion abnormalities. There is mild concentric left ventricular  hypertrophy. Left ventricular diastolic  function could not be evaluated.   2. Right ventricular systolic function is normal. The right ventricular  size is normal. There is mildly elevated pulmonary artery systolic  pressure. The estimated right ventricular systolic pressure is 16.1 mmHg.   3. Left atrial size was mild to moderately dilated.   4. The mitral valve is grossly normal. Trivial mitral valve  regurgitation. No evidence of mitral stenosis.   5. The aortic valve is tricuspid. Aortic valve regurgitation is not  visualized. No aortic stenosis is present.   6. The inferior vena cava is dilated in size with <50% respiratory  variability, suggesting right atrial pressure of 15 mmHg.   EKG:  EKG is personally reviewed. 10/02/2022: A fib. Rate 84  bpm. PVCs. ST and T wave abnormality inferior and anterior 03/28/2022: Sinus rhythm with first-degree AV block, PR interval 0.254 ms, frequent PVCs, right bundle branch block, heart rate of 66 bpm.- No acute changes  08/20/2021: Sinus bradycardia, 1st deg AVB, PAC   Recent Labs: 07/25/2022: BUN 12; Creatinine, Ser 0.97; Hemoglobin 11.8; Platelets  280; Potassium 4.2; Sodium 137  Recent Lipid Panel    Component Value Date/Time   CHOL 134 11/14/2015 1004   TRIG 24.0 11/14/2015 1004   HDL 78.70 11/14/2015 1004   CHOLHDL 2 11/14/2015 1004   VLDL 4.8 11/14/2015 1004   LDLCALC 51 11/14/2015 1004    Physical Exam:    VS:  BP (!) 142/87   Pulse 84   Ht 4' 9.5" (1.461 m)   Wt 176 lb 9.6 oz (80.1 kg)   SpO2 99%   BMI 37.55 kg/m     Wt Readings from Last 5 Encounters:  10/02/22 176 lb 9.6 oz (80.1 kg)  07/25/22 173 lb (78.5 kg)  03/28/22 180 lb 9.6 oz (81.9 kg)  08/20/21 171 lb 9.6 oz (77.8 kg)  05/24/21 168 lb (76.2 kg)    Constitutional: No acute distress Eyes: sclera non-icteric, normal conjunctiva and lids ENMT: normal dentition, moist mucous membranes Cardiovascular:  Irregularly irregular rhythm, normal rate, no murmur. S1 and S2 normal. No jugular venous distention.  Respiratory: clear to auscultation bilaterally.  GI : normal bowel sounds, soft and nontender. No distention.   MSK: extremities warm, well perfused. No edema.  NEURO: grossly nonfocal exam, moves all extremities. PSYCH: alert and oriented x 3, normal mood and affect.   ASSESSMENT:    1. Paroxysmal atrial fibrillation (HCC)   2. Secondary hypercoagulable state ([place])   3. Medication management     PLAN:    Paroxysmal atrial fibrillation ([place]) - Plan: EKG 12-Lead Secondary hypercoagulable state ([place]) TIA (transient ischemic attack) -We discussed stopping amiodarone at last given no recurrence of AF since hospitalization in May and likely trigger of sepsis. However, now with afib recurrence, would resume amiodarone. She has been symptomatic likely 2/2 to afib. -amiodarone load 400 mg BID for 6 days then 200 mg daily.  -I would continue Eliquis 5 mg twice daily as well as diltiazem 240 mg daily for anticoagulation and rate control.  Appears she is also on metoprolol 50 mg twice daily.  Okay to continue for now. Essential hypertension -Continue  diltiazem 240 mg daily, Lasix 20 mg as needed for volume, losartan 100 mg daily, metoprolol tartrate 50 mg twice daily.  Blood pressure  is relatively well controlled today.  Diastolic CHF, chronic (HCC)-as needed Lasix, he appears euvolemic today.  OSA (obstructive sleep apnea)-continue CPAP.  Plan: -Follow up in 1 month with EKG   Total time of encounter: 30 minutes total time of encounter, including 20 minutes spent in face-to-face patient care on the date of this encounter. This time includes coordination of care and counseling regarding above mentioned problem list. Remainder of non-face-to-face time involved reviewing chart documents/testing relevant to the patient encounter and documentation in the medical record. I have independently reviewed documentation from referring provider.   Cherlynn Kaiser, MD, [place]   Shared Decision Making/Informed Consent:       Medication Adjustments/Labs and Tests Ordered: Current medicines are reviewed at length with the patient today.  Concerns regarding medicines are outlined above.   Orders Placed This Encounter  Procedures   EKG 12-Lead   Meds ordered this encounter  Medications   amiodarone (PACERONE) 200 MG tablet    Sig: Take 1 tablet (200 mg total) by mouth daily. TAKE '400mg'$  TWICE DAILY FOR 6 DAYS. THEN REDUCE DOWN TO '200mg'$  ONCE DAILY THEREAFTER    Dispense:  60 tablet    Refill:  1   Patient Instructions  Medication Instructions:  START: AMIODARONE- PLEASE TAKE '400mg'$  TWICE DAILY FOR 6 DAYS ONLY- THEN REDUCE DOSE DOWN TO '200mg'$  ONCE DAILY THEREAFTER  *If you need a refill on your cardiac medications before your next appointment, please call your pharmacy*  Lab Work: None Ordered At This Time.  If you have labs (blood work) drawn today and your tests are completely normal, you will receive your results only by: [place] (if you have MyChart) OR A paper copy in the mail If you have any lab test  that is abnormal or we need to change your treatment, we will call you to review the results.  Testing/Procedures: None Ordered At This Time.   Follow-Up: At [facility], you and your health needs are our priority.  As part of our continuing mission to provide you with exceptional heart care, we have created designated Provider Care Teams.  These Care Teams include your primary Cardiologist (physician) and Advanced Practice Providers (APPs -  Physician Assistants and Nurse Practitioners) who all work together to provide you with the care you need, when you need it.  Your next appointment:   JANUARY 24th AT 1:40PM  The format for your next appointment:   In Person  Provider:   Elouise Munroe, MD          I,Rachel Rivera,acting as a scribe for Elouise Munroe, MD.,have documented all relevant documentation on the behalf of Elouise Munroe, MD,as directed by  Elouise Munroe, MD while in the presence of Elouise Munroe, MD.  I, Elouise Munroe, MD, have reviewed all documentation for the visit on 10/02/2022. The documentation on today's date of service for the exam, diagnosis, procedures, and orders are all accurate and complete.

## 2022-10-02 NOTE — Patient Instructions (Addendum)
Medication Instructions:  START: AMIODARONE- PLEASE TAKE '400mg'$  TWICE DAILY FOR 6 DAYS ONLY- THEN REDUCE DOSE DOWN TO '200mg'$  ONCE DAILY THEREAFTER  *If you need a refill on your cardiac medications before your next appointment, please call your pharmacy*  Lab Work: None Ordered At This Time.  If you have labs (blood work) drawn today and your tests are completely normal, you will receive your results only by: [place] (if you have MyChart) OR A paper copy in the mail If you have any lab test that is abnormal or we need to change your treatment, we will call you to review the results.  Testing/Procedures: None Ordered At This Time.   Follow-Up: At [facility], you and your health needs are our priority.  As part of our continuing mission to provide you with exceptional heart care, we have created designated Provider Care Teams.  These Care Teams include your primary Cardiologist (physician) and Advanced Practice Providers (APPs -  Physician Assistants and Nurse Practitioners) who all work together to provide you with the care you need, when you need it.  Your next appointment:   JANUARY 24th AT 1:40PM  The format for your next appointment:   In Person  Provider:   Elouise Munroe, MD

## 2022-10-21 ENCOUNTER — Other Ambulatory Visit: Payer: Medicare HMO

## 2022-11-05 ENCOUNTER — Ambulatory Visit: Payer: Medicare HMO | Attending: Internal Medicine | Admitting: Internal Medicine

## 2022-11-05 ENCOUNTER — Encounter: Payer: Self-pay | Admitting: Internal Medicine

## 2022-11-05 VITALS — BP 130/82 | HR 60 | Ht <= 58 in | Wt 173.8 lb

## 2022-11-05 DIAGNOSIS — Z79899 Other long term (current) drug therapy: Secondary | ICD-10-CM | POA: Diagnosis not present

## 2022-11-05 DIAGNOSIS — D6869 Other thrombophilia: Secondary | ICD-10-CM | POA: Diagnosis not present

## 2022-11-05 DIAGNOSIS — I480 Paroxysmal atrial fibrillation: Secondary | ICD-10-CM

## 2022-11-05 DIAGNOSIS — G4733 Obstructive sleep apnea (adult) (pediatric): Secondary | ICD-10-CM

## 2022-11-05 DIAGNOSIS — I10 Essential (primary) hypertension: Secondary | ICD-10-CM

## 2022-11-05 DIAGNOSIS — I5032 Chronic diastolic (congestive) heart failure: Secondary | ICD-10-CM

## 2022-11-05 DIAGNOSIS — G8929 Other chronic pain: Secondary | ICD-10-CM

## 2022-11-05 DIAGNOSIS — R0600 Dyspnea, unspecified: Secondary | ICD-10-CM

## 2022-11-05 DIAGNOSIS — M545 Low back pain, unspecified: Secondary | ICD-10-CM

## 2022-11-05 DIAGNOSIS — G459 Transient cerebral ischemic attack, unspecified: Secondary | ICD-10-CM

## 2022-11-05 DIAGNOSIS — R002 Palpitations: Secondary | ICD-10-CM

## 2022-11-05 MED ORDER — AMIODARONE HCL 200 MG PO TABS: 200.0000 mg | ORAL_TABLET | Freq: Every day | ORAL | 1 refills | Status: DC

## 2022-11-05 NOTE — Progress Notes (Signed)
Cardiology Office Note:    Date:  11/05/2022   ID:  Alisha Terrell, DOB 10-Jan-1941, MRN [id number]  PCP:  Christain Sacramento, MD  Cardiologist:  Elouise Munroe, MD  Electrophysiologist:  None   Referring MD: Christain Sacramento, MD   Chief Complaint/Reason for Referral: Atrial fibrillation  History of Present Illness:    Alisha Terrell is a 82 y.o. female with a history of HTN, asthma, TIA, OSA on CPAP,obesity, chronic back pain, who presented to the ER 03/06/2021 with c/o weakness, nausea, vomiting, palpitation, and nothing to eat and drink for 3 days, found to have sepsis with lactic acidosis >2, 2/2 presumed toe blister with infection, AKI, and new onset A. fib with RVR with a CHA2DS2-VASc score of 5 (hypertension, age x2, TIA x2).  I saw her while she was in the hospital, where we titrated beta-blocker and amiodarone.  Plan was initially for cardioversion however she converted while on amiodarone, and fortunately her sepsis improved and resolved.  She follows with podiatry who closely monitors her feet.    At her last visit on 10/02/2022, she reports having an episode of lightheadedness the previous week. She called the ambulance after her symptoms did not resolve. She attributed this episode to her being in atrial fibrillation. She reported that she did not feel it as much during the visit. She was compliant with her Lasix. She endorsed experiencing shortness of breath that she attributed to her asthma. She reported she had been having major life stressors with her husbands health. She had chronic back pain for about 25 years that had slightly improved when her husband began receiving care in a rehab facility.  Today, she presents feeling overall well.  She is compliant with her amiodarone 200 mg daily. She has been compliant with her Diltiazem and Eliquis. She reports an episode of her nose bleeding but denies any relation to her Eliquis and attributes it to the heater. She is complaint  with Lasix every morning. She endorses swelling in her right leg.   We discussed her EKG today, she is no longer in atrial fibrillation. No significant concerns. We also discussed her electrolytes and liver tests from recent labs, unremarkable.  She endorses back and knee pains, chronic. She has been using a walker. She previously wore a brace on her right leg but since pain has improved, she only wears it when she plans to be on her feet for long periods of time.  She continues to have life stressors with her husband being ill, recent complex hospitalization for him.   She denies any palpitations, chest pain, shortness of breath. No lightheadedness, headaches, syncope, orthopnea, or PND.  Past Medical History:  Diagnosis Date   ARF (acute renal failure) ([place])    2020  resolved  it was presrnt when she was septic   Arthritis    Asthma    CHF (congestive heart failure) (HCC)    Chronic back pain    Class 2 obesity due to excess calories with body mass index (BMI) of 35.0 to 35.9 in adult    Dyspnea    ocassional   GERD (gastroesophageal reflux disease)    Headache    Hypertension    OSA (obstructive sleep apnea)    PAF (paroxysmal atrial fibrillation) (HCC)    TIA (transient ischemic attack) 2018   no resedule    Past Surgical History:  Procedure Laterality Date   ABDOMINAL SURGERY     CESAREAN SECTION  CHOLECYSTECTOMY     LESION EXCISION WITH COMPLEX REPAIR Left 07/25/2022   Procedure: LESION EXCISION OF LEFT BUCCAL MUCOSA;  Surgeon: Jason Coop, DO;  Location: [facility] OR;  Service: ENT;  Laterality: Left;    Current Medications: Current Meds  Medication Sig   acetaminophen (TYLENOL) 500 MG tablet Take 500 mg by mouth every 6 (six) hours as needed. Two Tablets Every 6 hours as Needed For Pain   ADVAIR DISKUS 250-50 MCG/DOSE AEPB Inhale 1 puff into the lungs 2 (two) times daily.    apixaban (ELIQUIS) 5 MG TABS tablet TAKE 1 TABLET BY MOUTH TWICE A DAY   aspirin EC 81  MG tablet Take 81 mg by mouth daily.   Azelastine HCl 137 MCG/SPRAY SOLN Place 1 spray into both nostrils daily.   Dextromethorphan-Guaifenesin (ROBITUSSIN COUGH/CHEST DM MAX PO) Take 1 mL by mouth 2 (two) times daily as needed (for cold symptoms).    diclofenac Sodium (VOLTAREN) 1 % GEL Apply 2 g topically at bedtime.   diltiazem (CARDIZEM CD) 240 MG 24 hr capsule Take 1 capsule (240 mg total) by mouth daily.   doxycycline (VIBRA-TABS) 100 MG tablet Take 1 tablet (100 mg total) by mouth 2 (two) times daily.   fluticasone (FLONASE) 50 MCG/ACT nasal spray Place 1 spray into both nostrils at bedtime.    furosemide (LASIX) 20 MG tablet Take 1 tablet (20 mg total) by mouth daily as needed for fluid.   ibandronate (BONIVA) 150 MG tablet Take 150 mg by mouth every 30 (thirty) days.    lidocaine (LMX) 4 % cream Apply 1 Application topically as needed (back pain).   losartan (COZAAR) 100 MG tablet Take 1 tablet (100 mg total) by mouth daily.   metoprolol tartrate (LOPRESSOR) 50 MG tablet Take 1 tablet (50 mg total) by mouth 2 (two) times daily.   montelukast (SINGULAIR) 10 MG tablet Take 10 mg by mouth at bedtime.   Multiple Vitamins-Minerals (CENTRUM SILVER ADULT 50+ PO) Take 1 tablet by mouth daily.   omeprazole (PRILOSEC) 40 MG capsule Take 40 mg by mouth in the morning and at bedtime.    ondansetron (ZOFRAN-ODT) 4 MG disintegrating tablet Take 4 mg by mouth every 8 (eight) hours as needed for vomiting or nausea.   PROAIR HFA 108 (90 Base) MCG/ACT inhaler Inhale 1-2 puffs into the lungs every 6 (six) hours as needed for wheezing or shortness of breath.    traMADol (ULTRAM) 50 MG tablet Take 50 mg by mouth every 6 (six) hours as needed for moderate pain or severe pain.   [DISCONTINUED] amiodarone (PACERONE) 200 MG tablet Take 1 tablet (200 mg total) by mouth daily. TAKE '400mg'$  TWICE DAILY FOR 6 DAYS. THEN REDUCE DOWN TO '200mg'$  ONCE DAILY THEREAFTER     Allergies:   Codeine and Omnicef [cefdinir]    Social History   Tobacco Use   Smoking status: Never    Passive exposure: Yes   Smokeless tobacco: Never  Vaping Use   Vaping Use: Never used  Substance Use Topics   Alcohol use: Not Currently    Alcohol/week: 3.0 standard drinks of alcohol    Types: 3 Glasses of wine per week   Drug use: No     Family History: The patient's family history includes Hypertension (age of onset: 103) in her mother; Pneumonia (age of onset: 73) in her father.  ROS:   Please see the history of present illness.    (+) Back pain (+) Knee pain (+) Right leg  edema All other systems reviewed and are negative.  EKGs/Labs/Other Studies Reviewed:    The following studies were reviewed today:  LE Doppler 03/08/2021: Summary:  Right: Resting right ankle-brachial index is within normal range. No  evidence of significant right lower extremity arterial disease. The right  toe-brachial index is normal.   Left: Resting left ankle-brachial index is within normal range. No  evidence of significant left lower extremity arterial disease. The left  toe-brachial index is abnormal.   Echo 03/08/2021: IMPRESSIONS   1. Left ventricular ejection fraction, by estimation, is 55 to 60%. The  left ventricle has normal function. The left ventricle has no regional  wall motion abnormalities. There is mild concentric left ventricular  hypertrophy. Left ventricular diastolic  function could not be evaluated.   2. Right ventricular systolic function is normal. The right ventricular  size is normal. There is mildly elevated pulmonary artery systolic  pressure. The estimated right ventricular systolic pressure is 84.6 mmHg.   3. Left atrial size was mild to moderately dilated.   4. The mitral valve is grossly normal. Trivial mitral valve  regurgitation. No evidence of mitral stenosis.   5. The aortic valve is tricuspid. Aortic valve regurgitation is not  visualized. No aortic stenosis is present.   6. The inferior vena  cava is dilated in size with <50% respiratory  variability, suggesting right atrial pressure of 15 mmHg.   EKG:  EKG is personally reviewed. 11/05/2022: Sinus rhythm. Rate 60 bpm. 1st degree AV block. PAC. RBBB. 10/02/2022: A fib. Rate 84  bpm. PVCs. ST and T wave abnormality inferior and anterior 03/28/2022: Sinus rhythm with first-degree AV block, PR interval 0.254 ms, frequent PVCs, right bundle branch block, heart rate of 66 bpm.- No acute changes  08/20/2021: Sinus bradycardia, 1st deg AVB, PAC   Recent Labs: 07/25/2022: BUN 12; Creatinine, Ser 0.97; Hemoglobin 11.8; Platelets 280; Potassium 4.2; Sodium 137  Recent Lipid Panel    Component Value Date/Time   CHOL 134 11/14/2015 1004   TRIG 24.0 11/14/2015 1004   HDL 78.70 11/14/2015 1004   CHOLHDL 2 11/14/2015 1004   VLDL 4.8 11/14/2015 1004   LDLCALC 51 11/14/2015 1004    Physical Exam:    VS:  BP 130/82   Pulse 60   Ht '4\' 9"'$  (1.448 m)   Wt 173 lb 12.8 oz (78.8 kg)   SpO2 99%   BMI 37.61 kg/m     Wt Readings from Last 5 Encounters:  11/05/22 173 lb 12.8 oz (78.8 kg)  10/02/22 176 lb 9.6 oz (80.1 kg)  07/25/22 173 lb (78.5 kg)  03/28/22 180 lb 9.6 oz (81.9 kg)  08/20/21 171 lb 9.6 oz (77.8 kg)    Constitutional: No acute distress Eyes: sclera non-icteric, normal conjunctiva and lids ENMT: normal dentition, moist mucous membranes Cardiovascular:  RRR, normal rate, no murmur. S1 and S2 normal. No jugular venous distention.  Respiratory: clear to auscultation bilaterally.  GI : normal bowel sounds, soft and nontender. No distention.   MSK: extremities warm, well perfused. No edema.  NEURO: grossly nonfocal exam, moves all extremities. PSYCH: alert and oriented x 3, normal mood and affect.   ASSESSMENT:    1. Paroxysmal atrial fibrillation (HCC)      PLAN:    Paroxysmal atrial fibrillation ([place]) - Plan: EKG 12-Lead Secondary hypercoagulable state ([place]) TIA (transient ischemic attack) Palpitations -We had  previously stopped amiodarone given no recurrence of AF since hospitalization in May and likely trigger of sepsis. However,  now with afib recurrence, we resumed amiodarone, which has restored SR.  -amiodarone 200 mg daily. Refill today. Continue indefinitely. -continue Eliquis 5 mg twice daily as well as diltiazem 240 mg daily for anticoagulation and rate control.  Appears she is also on metoprolol 50 mg twice daily.  Okay to continue for now. - palpitations and sx resolved.  Essential hypertension -Continue diltiazem 240 mg daily, Lasix 20 mg as needed for volume, losartan 100 mg daily, metoprolol tartrate 50 mg twice daily.  Blood pressure is relatively well controlled today.  Diastolic CHF, chronic ([place])- cont Lasix, she appears euvolemic today.  OSA (obstructive sleep apnea)-continue CPAP.    Total time of encounter: 30 minutes total time of encounter, including 20 minutes spent in face-to-face patient care on the date of this encounter. This time includes coordination of care and counseling regarding above mentioned problem list. Remainder of non-face-to-face time involved reviewing chart documents/testing relevant to the patient encounter and documentation in the medical record. I have independently reviewed documentation from referring provider.   Cherlynn Kaiser, MD, [place]   Shared Decision Making/Informed Consent:       Medication Adjustments/Labs and Tests Ordered: Current medicines are reviewed at length with the patient today.  Concerns regarding medicines are outlined above.   Orders Placed This Encounter  Procedures   EKG 12-Lead   Meds ordered this encounter  Medications   amiodarone (PACERONE) 200 MG tablet    Sig: Take 1 tablet (200 mg total) by mouth daily.    Dispense:  90 tablet    Refill:  1   Patient Instructions  Medication Instructions:  Continue taking '200mg'$  Amiodarone daily *If you need a refill on your cardiac medications  before your next appointment, please call your pharmacy*   Lab Work: None If you have labs (blood work) drawn today and your tests are completely normal, you will receive your results only by: [place] (if you have MyChart) OR A paper copy in the mail If you have any lab test that is abnormal or we need to change your treatment, we will call you to review the results.   Testing/Procedures: None   Follow-Up: At [facility], you and your health needs are our priority.  As part of our continuing mission to provide you with exceptional heart care, we have created designated Provider Care Teams.  These Care Teams include your primary Cardiologist (physician) and Advanced Practice Providers (APPs -  Physician Assistants and Nurse Practitioners) who all work together to provide you with the care you need, when you need it.  We recommend signing up for the patient portal called "MyChart".  Sign up information is provided on this After Visit Summary.  MyChart is used to connect with patients for Virtual Visits (Telemedicine).  Patients are able to view lab/test results, encounter notes, upcoming appointments, etc.  Non-urgent messages can be sent to your provider as well.   To learn more about what you can do with MyChart, go to [email or web address].    Your next appointment:   3 month(s)  Provider:   Elouise Munroe, MD         I,Rachel Rivera,acting as a scribe for Elouise Munroe, MD.,have documented all relevant documentation on the behalf of Elouise Munroe, MD,as directed by  Elouise Munroe, MD while in the presence of Elouise Munroe, MD.  I, Elouise Munroe, MD, have reviewed all documentation for the visit on 11/05/2022. The documentation  on today's date of service for the exam, diagnosis, procedures, and orders are all accurate and complete.

## 2022-11-05 NOTE — Patient Instructions (Signed)
Medication Instructions:  Continue taking '200mg'$  Amiodarone daily *If you need a refill on your cardiac medications before your next appointment, please call your pharmacy*   Lab Work: None If you have labs (blood work) drawn today and your tests are completely normal, you will receive your results only by: [place] (if you have MyChart) OR A paper copy in the mail If you have any lab test that is abnormal or we need to change your treatment, we will call you to review the results.   Testing/Procedures: None   Follow-Up: At [facility], you and your health needs are our priority.  As part of our continuing mission to provide you with exceptional heart care, we have created designated Provider Care Teams.  These Care Teams include your primary Cardiologist (physician) and Advanced Practice Providers (APPs -  Physician Assistants and Nurse Practitioners) who all work together to provide you with the care you need, when you need it.  We recommend signing up for the patient portal called "MyChart".  Sign up information is provided on this After Visit Summary.  MyChart is used to connect with patients for Virtual Visits (Telemedicine).  Patients are able to view lab/test results, encounter notes, upcoming appointments, etc.  Non-urgent messages can be sent to your provider as well.   To learn more about what you can do with MyChart, go to [email or web address].    Your next appointment:   3 month(s)  Provider:   Elouise Munroe, MD

## 2022-11-06 ENCOUNTER — Ambulatory Visit: Payer: Medicare HMO | Admitting: Podiatry

## 2022-11-06 DIAGNOSIS — L84 Corns and callosities: Secondary | ICD-10-CM

## 2022-11-06 DIAGNOSIS — M79674 Pain in right toe(s): Secondary | ICD-10-CM | POA: Diagnosis not present

## 2022-11-06 DIAGNOSIS — Z7901 Long term (current) use of anticoagulants: Secondary | ICD-10-CM

## 2022-11-06 DIAGNOSIS — M79675 Pain in left toe(s): Secondary | ICD-10-CM

## 2022-11-06 DIAGNOSIS — B351 Tinea unguium: Secondary | ICD-10-CM

## 2022-11-11 NOTE — Progress Notes (Signed)
Subjective: Chief Complaint  Patient presents with   routine foot care      82 y.o. returns the office today for thick, elongated toenails that she is not able to trim herself.  No open lesions or reports.  She states that she has been doing well without any new concerns to her feet.   She is on Eliquis  PCP: Dr. Irene Pap, DO Lat seen 09/23/2022  Objective: AAO 3, NAD DP/PT pulses palpable, CRT less than 3 seconds.  Nails are hypertrophic, dystrophic, brittle, discolored, elongated 10. No surrounding redness or drainage. Tenderness nails 1-5 bilaterally. No open lesions or pre-ulcerative lesions are identified today.  Incurvation of the hallux nail border.  There is no signs of infection or pain associated with ingrown toenail today. Mild callus formation present to left second toe along the distal aspect without any underlying ulceration drainage or any signs of infection. No pain with calf compression, swelling, warmth, erythema.  Assessment: Symptomatic onychosis preulcerative callus left second toe  Plan: -Treatment options including alternatives, risks, complications were discussed -Sharply debrided the nails x10 without any complications or bleeding. -Sharply debrided the callus on the left second toe there are any complications or bleeding.  Continue offloading. -Daily foot inspection.   Return in about 3 months (around 02/05/2023).  Trula Slade DPM

## 2022-12-01 ENCOUNTER — Ambulatory Visit
Admission: RE | Admit: 2022-12-01 | Discharge: 2022-12-01 | Disposition: A | Discharge status: Home / Self Care | Payer: Medicare HMO | Source: Ambulatory Visit | Attending: Family Medicine | Admitting: Family Medicine

## 2022-12-01 DIAGNOSIS — E041 Nontoxic single thyroid nodule: Secondary | ICD-10-CM

## 2022-12-03 ENCOUNTER — Other Ambulatory Visit: Payer: Self-pay

## 2022-12-03 DIAGNOSIS — I4891 Unspecified atrial fibrillation: Secondary | ICD-10-CM

## 2022-12-03 MED ORDER — APIXABAN 5 MG PO TABS: 5.0000 mg | ORAL_TABLET | Freq: Two times a day (BID) | ORAL | 1 refills | Status: DC

## 2022-12-03 NOTE — Telephone Encounter (Signed)
Prescription refill request for Eliquis received. Indication: a fib Last office visit: 11/05/22 Scr: 0.97 07/25/22 Age:  82 Weight: 78kg

## 2022-12-05 ENCOUNTER — Other Ambulatory Visit: Payer: Self-pay | Admitting: Adult Health

## 2023-01-04 ENCOUNTER — Other Ambulatory Visit: Payer: Self-pay | Admitting: Adult Health

## 2023-02-04 NOTE — Progress Notes (Signed)
Cardiology Office Note:    Date:  02/04/2023  ID:  Alisha Terrell, DOB 1940/11/05, MRN [id number]  PCP:  Barbie Banner, MD  Cardiologist:  Parke Poisson, MD  Electrophysiologist:  None   Referring MD: Barbie Banner, MD   Chief Complaint/Reason for Referral: Atrial fibrillation  History of Present Illness:    Alisha Terrell is a 82 y.o. female with a history of HTN, asthma, TIA, OSA on CPAP,obesity, chronic back pain, who presented to the ER 03/06/2021 with c/o weakness, nausea, vomiting, palpitation, and nothing to eat and drink for 3 days, found to have sepsis with lactic acidosis >2, 2/2 presumed toe blister with infection, AKI, and new onset A. fib with RVR with a CHA2DS2-VASc score of 5 (hypertension, age x2, TIA x2).  I saw her while she was in the hospital, where we titrated beta-blocker and amiodarone.  Plan was initially for cardioversion however she converted while on amiodarone, and fortunately her sepsis improved and resolved.  She follows with podiatry who closely monitors her feet.    Ata prior visit she reported having an episode of lightheadedness the previous week. She called the ambulance after her symptoms did not resolve. She attributed this episode to her being in atrial fibrillation. She reported that she did not feel it as much during the visit. She was compliant with her Lasix. She endorsed experiencing shortness of breath that she attributed to her asthma. She reported she had been having major life stressors with her husbands health. She had chronic back pain for about 25 years that had slightly improved when her husband began receiving care in a rehab facility.  At her last visit, she presented overall well. She was compliant with her amiodarone 200 mg daily. She has been compliant with her Diltiazem and Eliquis. She reported an episode of her nose bleeding but denied any relation to her Eliquis and attributed it to the heater. She was complaint with Lasix  every morning. She endorsed swelling in her right leg.   We discussed her EKG at the time, she was no longer in atrial fibrillation. No significant concerns. We also discussed her electrolytes and liver tests from recent labs, unremarkable.  She endorsed back and knee pains, chronic. She had been using a walker. She previously wore a brace on her right leg but since pain has improved, she was only wearing it when she planned to be on her feet for long periods of time.  She continued to have life stressors with her husband being ill, recent complex hospitalization for him.    Today, ***   She denies any palpitations, chest pain, shortness of breath, or peripheral edema. No lightheadedness, headaches, syncope, orthopnea, or PND.  (+)   Past Medical History:  Diagnosis Date   ARF (acute renal failure) (HCC)    2020  resolved  it was presrnt when she was septic   Arthritis    Asthma    CHF (congestive heart failure) (HCC)    Chronic back pain    Class 2 obesity due to excess calories with body mass index (BMI) of 35.0 to 35.9 in adult    Dyspnea    ocassional   GERD (gastroesophageal reflux disease)    Headache    Hypertension    OSA (obstructive sleep apnea)    PAF (paroxysmal atrial fibrillation) (HCC)    TIA (transient ischemic attack) 2018   no resedule    Past Surgical History:  Procedure Laterality Date  ABDOMINAL SURGERY     CESAREAN SECTION     CHOLECYSTECTOMY     LESION EXCISION WITH COMPLEX REPAIR Left 07/25/2022   Procedure: LESION EXCISION OF LEFT BUCCAL MUCOSA;  Surgeon: Laren Boom, DO;  Location: [facility] OR;  Service: ENT;  Laterality: Left;    Current Medications: No outpatient medications have been marked as taking for the 02/05/23 encounter (Appointment) with Parke Poisson, MD.     Allergies:   Codeine and Omnicef [cefdinir]   Social History   Tobacco Use   Smoking status: Never    Passive exposure: Yes   Smokeless tobacco: Never  Vaping  Use   Vaping Use: Never used  Substance Use Topics   Alcohol use: Not Currently    Alcohol/week: 3.0 standard drinks of alcohol    Types: 3 Glasses of wine per week   Drug use: No     Family History: The patient's family history includes Hypertension (age of onset: 78) in her mother; Pneumonia (age of onset: 73) in her father.  ROS:   Please see the history of present illness.     All other systems reviewed and are negative.  EKGs/Labs/Other Studies Reviewed:    The following studies were reviewed today:  LE Doppler 03/08/2021: Summary:  Right: Resting right ankle-brachial index is within normal range. No  evidence of significant right lower extremity arterial disease. The right  toe-brachial index is normal.   Left: Resting left ankle-brachial index is within normal range. No  evidence of significant left lower extremity arterial disease. The left  toe-brachial index is abnormal.   Echo 03/08/2021: IMPRESSIONS   1. Left ventricular ejection fraction, by estimation, is 55 to 60%. The  left ventricle has normal function. The left ventricle has no regional  wall motion abnormalities. There is mild concentric left ventricular  hypertrophy. Left ventricular diastolic  function could not be evaluated.   2. Right ventricular systolic function is normal. The right ventricular  size is normal. There is mildly elevated pulmonary artery systolic  pressure. The estimated right ventricular systolic pressure is 43.1 mmHg.   3. Left atrial size was mild to moderately dilated.   4. The mitral valve is grossly normal. Trivial mitral valve  regurgitation. No evidence of mitral stenosis.   5. The aortic valve is tricuspid. Aortic valve regurgitation is not  visualized. No aortic stenosis is present.   6. The inferior vena cava is dilated in size with <50% respiratory  variability, suggesting right atrial pressure of 15 mmHg.   EKG:  EKG is personally reviewed. 11/05/2022: Sinus rhythm.  Rate 60 bpm. 1st degree AV block. PAC. RBBB. 10/02/2022: A fib. Rate 84  bpm. PVCs. ST and T wave abnormality inferior and anterior 03/28/2022: Sinus rhythm with first-degree AV block, PR interval 0.254 ms, frequent PVCs, right bundle branch block, heart rate of 66 bpm.- No acute changes  08/20/2021: Sinus bradycardia, 1st deg AVB, PAC   Recent Labs: 07/25/2022: BUN 12; Creatinine, Ser 0.97; Hemoglobin 11.8; Platelets 280; Potassium 4.2; Sodium 137  Recent Lipid Panel    Component Value Date/Time   CHOL 134 11/14/2015 1004   TRIG 24.0 11/14/2015 1004   HDL 78.70 11/14/2015 1004   CHOLHDL 2 11/14/2015 1004   VLDL 4.8 11/14/2015 1004   LDLCALC 51 11/14/2015 1004    Physical Exam:    VS:  There were no vitals taken for this visit.    Wt Readings from Last 5 Encounters:  11/05/22 173 lb 12.8 oz (78.8  kg)  10/02/22 176 lb 9.6 oz (80.1 kg)  07/25/22 173 lb (78.5 kg)  03/28/22 180 lb 9.6 oz (81.9 kg)  08/20/21 171 lb 9.6 oz (77.8 kg)    Constitutional: No acute distress Eyes: sclera non-icteric, normal conjunctiva and lids ENMT: normal dentition, moist mucous membranes Cardiovascular:  RRR, normal rate, no murmur. S1 and S2 normal. No jugular venous distention.  Respiratory: clear to auscultation bilaterally.  GI : normal bowel sounds, soft and nontender. No distention.   MSK: extremities warm, well perfused. No edema.  NEURO: grossly nonfocal exam, moves all extremities. PSYCH: alert and oriented x 3, normal mood and affect.   ASSESSMENT:    No diagnosis found.    PLAN:    Paroxysmal atrial fibrillation (HCC) - Plan: EKG 12-Lead Secondary hypercoagulable state (HCC) TIA (transient ischemic attack) Palpitations -We had previously stopped amiodarone given no recurrence of AF since hospitalization in May and likely trigger of sepsis. However, now with afib recurrence, we resumed amiodarone, which has restored SR.  -amiodarone 200 mg daily. Refill today. Continue  indefinitely. -continue Eliquis 5 mg twice daily as well as diltiazem 240 mg daily for anticoagulation and rate control.  Appears she is also on metoprolol 50 mg twice daily.  Okay to continue for now. - palpitations and sx resolved.  Essential hypertension -Continue diltiazem 240 mg daily, Lasix 20 mg as needed for volume, losartan 100 mg daily, metoprolol tartrate 50 mg twice daily.  Blood pressure is relatively well controlled today.  Diastolic CHF, chronic (HCC)- cont Lasix, she appears euvolemic today.  OSA (obstructive sleep apnea)-continue CPAP.  *** Plan: - Follow up in *** -     Total time of encounter: *** minutes total time of encounter, including *** minutes spent in face-to-face patient care on the date of this encounter. This time includes coordination of care and counseling regarding above mentioned problem list. Remainder of non-face-to-face time involved reviewing chart documents/testing relevant to the patient encounter and documentation in the medical record. I have independently reviewed documentation from referring provider.   Weston Brass, MD, [facility]   [facility]   Shared Decision Making/Informed Consent:       Medication Adjustments/Labs and Tests Ordered: Current medicines are reviewed at length with the patient today.  Concerns regarding medicines are outlined above.   No orders of the defined types were placed in this encounter.  No orders of the defined types were placed in this encounter.  There are no Patient Instructions on file for this visit.    I,Rachel Rivera,acting as a scribe for Parke Poisson, MD.,have documented all relevant documentation on the behalf of Parke Poisson, MD,as directed by  Parke Poisson, MD while in the presence of Parke Poisson, MD.  ***

## 2023-02-05 ENCOUNTER — Ambulatory Visit: Payer: Medicare HMO | Attending: Internal Medicine | Admitting: Internal Medicine

## 2023-02-05 ENCOUNTER — Encounter: Payer: Self-pay | Admitting: Internal Medicine

## 2023-02-05 VITALS — BP 149/75 | HR 57 | Ht <= 58 in | Wt 174.4 lb

## 2023-02-05 DIAGNOSIS — I5032 Chronic diastolic (congestive) heart failure: Secondary | ICD-10-CM

## 2023-02-05 DIAGNOSIS — I10 Essential (primary) hypertension: Secondary | ICD-10-CM | POA: Diagnosis not present

## 2023-02-05 DIAGNOSIS — D6869 Other thrombophilia: Secondary | ICD-10-CM

## 2023-02-05 DIAGNOSIS — I480 Paroxysmal atrial fibrillation: Secondary | ICD-10-CM

## 2023-02-05 DIAGNOSIS — G4733 Obstructive sleep apnea (adult) (pediatric): Secondary | ICD-10-CM

## 2023-02-05 NOTE — Patient Instructions (Signed)
Medication Instructions:  No Changes In Medications at this time.  *If you need a refill on your cardiac medications before your next appointment, please call your pharmacy*  Follow-Up: At [facility], you and your health needs are our priority.  As part of our continuing mission to provide you with exceptional heart care, we have created designated Provider Care Teams.  These Care Teams include your primary Cardiologist (physician) and Advanced Practice Providers (APPs -  Physician Assistants and Nurse Practitioners) who all work together to provide you with the care you need, when you need it.  Your next appointment:   6 month(s)  Provider:   Gayatri A Acharya, MD    

## 2023-02-06 ENCOUNTER — Ambulatory Visit: Payer: Medicare HMO | Admitting: Podiatry

## 2023-02-24 ENCOUNTER — Ambulatory Visit: Payer: Medicare HMO | Admitting: Podiatry

## 2023-02-24 DIAGNOSIS — M79674 Pain in right toe(s): Secondary | ICD-10-CM

## 2023-02-24 DIAGNOSIS — M79675 Pain in left toe(s): Secondary | ICD-10-CM | POA: Diagnosis not present

## 2023-02-24 DIAGNOSIS — B351 Tinea unguium: Secondary | ICD-10-CM | POA: Diagnosis not present

## 2023-02-24 DIAGNOSIS — S90426A Blister (nonthermal), unspecified lesser toe(s), initial encounter: Secondary | ICD-10-CM | POA: Diagnosis not present

## 2023-02-24 DIAGNOSIS — Z7901 Long term (current) use of anticoagulants: Secondary | ICD-10-CM | POA: Diagnosis not present

## 2023-02-24 NOTE — Progress Notes (Signed)
Subjective: Chief Complaint  Patient presents with   Nail Problem    Patient reports long thick painful toenails that are difficult to trim. Patient also reports a spot on the 2nd digit that looks like blister that is causing discomfort.      82 y.o. returns the office today for thick, elongated toenails that she is not able to trim herself.  No open lesions or reports.  She states the left second toe was hurting and today she noticed what she thought was a blister on the second toe.  She does not recall any injury.  No swelling redness or drainage.  She is on Eliquis  PCP: Dr. Adelene Amas, DO Lat seen 09/23/2022  Objective: AAO 3, NAD DP/PT pulses palpable, CRT less than 3 seconds.  Nails are hypertrophic, dystrophic, brittle, discolored, elongated 10. No surrounding redness or drainage. Tenderness nails 1-5 bilaterally. No open lesions or pre-ulcerative lesions are identified today.  Incurvation of the hallux nail border.  There is no signs of infection or pain associated with ingrown toenail today. Hyperkeratotic lesion at the distal aspect left second toe and just adjacent to this in the area of hemorrhagic blister.  I was able to debride this and superficial skin breakdown noted No pain with calf compression, swelling, warmth, erythema.  Assessment: Symptomatic onychomycosis ; superficial wound, blister left second toe without signs of infection   Plan: -Treatment options including alternatives, risks, complications were discussed -Sharply debrided the nails x10 without any complications or bleeding. -Serpe to be the hyperkeratotic lesion distal aspect left second toe without any complications as well as the blister.  Superficial wounds present but there is no signs of infection today.  Recommended a small amount of antibiotic ointment dressing changes daily.  Dispensed toe cap for offloading.  Monitor for any signs or symptoms of infection. -Daily foot inspection.   Follow-up  in 2 to 3 weeks for left second toe given her history of infection, sepsis from the blister previously.  Vivi Barrack DPM

## 2023-03-19 ENCOUNTER — Ambulatory Visit: Payer: Medicare HMO | Admitting: Podiatry

## 2023-03-19 DIAGNOSIS — S90426A Blister (nonthermal), unspecified lesser toe(s), initial encounter: Secondary | ICD-10-CM | POA: Diagnosis not present

## 2023-03-19 NOTE — Progress Notes (Signed)
Subjective: Chief Complaint  Patient presents with   Chronic anticoagulation   82 year old female presents the office today for follow evaluation of the wound of her left second toe, blister.  She states is doing well and she denies any drainage or pus or any fevers or chills that she reports.  She has no open lesions.  No other concerns.  Objective: AAO x3, NAD DP/PT pulses palpable bilaterally, CRT less than 3 seconds Hammertoe contractures present.  The distal aspect left second toe is the wound is healed there is no blister formation.  There is minimal callus formation with a debrided today and there is no underlying ulceration drainage or signs of infection. No pain with calf compression, swelling, warmth, erythema  Assessment: Resolved ulceration left foot, blister  Plan: -All treatment options discussed with the patient including all alternatives, risks, complications.  -Debrided the callus with any complications or bleeding.  Continue offloading and monitor any signs or symptoms of infection or reoccurrence. -Discussed daily foot inspection -Patient encouraged to call the office with any questions, concerns, change in symptoms.   Vivi Barrack DPM

## 2023-04-30 ENCOUNTER — Ambulatory Visit: Payer: Medicare HMO | Admitting: Podiatry

## 2023-04-30 DIAGNOSIS — Z7901 Long term (current) use of anticoagulants: Secondary | ICD-10-CM

## 2023-04-30 DIAGNOSIS — M79674 Pain in right toe(s): Secondary | ICD-10-CM | POA: Diagnosis not present

## 2023-04-30 DIAGNOSIS — M79675 Pain in left toe(s): Secondary | ICD-10-CM

## 2023-04-30 DIAGNOSIS — L84 Corns and callosities: Secondary | ICD-10-CM | POA: Diagnosis not present

## 2023-04-30 DIAGNOSIS — B351 Tinea unguium: Secondary | ICD-10-CM | POA: Diagnosis not present

## 2023-05-03 NOTE — Progress Notes (Signed)
Subjective: Chief Complaint  Patient presents with   Nail Problem    Patient came in today for for routine foot care, nail trim, callus on the tip of the 2nd toe,     82 y.o. returns the office today for thick, elongated toenails that she is not able to trim herself.  The left second toe is doing better.  Still gets callus with separate no skin breakdown or ulcerations and no swelling or redness.  No other concerns today.   She is on Eliquis  PCP: Dr. Adelene Amas, DO Lat seen 03/25/2023  Objective: AAO 3, NAD DP/PT pulses palpable, CRT less than 3 seconds.  Nails are hypertrophic, dystrophic, brittle, discolored, elongated 10. No surrounding redness or drainage. Tenderness nails 1-5 bilaterally. No open lesions or pre-ulcerative lesions are identified today.  Incurvation of the hallux nail border.  There is no signs of infection or pain associated with ingrown toenail today. Hyperkeratotic lesion present to left second toe along the distal aspect without any underlying ulceration drainage or any signs of infection. No pain with calf compression, swelling, warmth, erythema.  Assessment: Symptomatic onychosis preulcerative callus left second toe  Plan: -Treatment options including alternatives, risks, complications were discussed -Sharply debrided the nails x10 without any complications or bleeding. -Sharply debrided the callus on the left second toe there are any complications or bleeding.  Continue offloading. -Daily foot inspection.   Return in about 9 weeks (around 07/02/2023) for routine care.  Vivi Barrack DPM

## 2023-05-14 ENCOUNTER — Other Ambulatory Visit: Payer: Self-pay | Admitting: Internal Medicine

## 2023-05-18 ENCOUNTER — Other Ambulatory Visit: Payer: Self-pay

## 2023-05-18 MED ORDER — AMIODARONE HCL 200 MG PO TABS: 200.0000 mg | ORAL_TABLET | Freq: Every day | ORAL | 2 refills | Status: DC

## 2023-06-01 ENCOUNTER — Other Ambulatory Visit: Payer: Self-pay

## 2023-06-01 DIAGNOSIS — I4891 Unspecified atrial fibrillation: Secondary | ICD-10-CM

## 2023-06-01 MED ORDER — APIXABAN 5 MG PO TABS: 5.0000 mg | ORAL_TABLET | Freq: Two times a day (BID) | ORAL | 1 refills | Status: DC

## 2023-06-01 NOTE — Telephone Encounter (Signed)
Prescription refill request for Eliquis received. Indication:AFIB Last office visit:4/24 Scr:0.95  6/24 Age: 82 Weight:79.1  kg  Prescription refilled

## 2023-07-09 ENCOUNTER — Ambulatory Visit: Payer: Medicare HMO | Admitting: Podiatry

## 2023-08-06 NOTE — Progress Notes (Signed)
Cardiology Office Note:  .   Date:  08/07/2023  ID:  Alisha Terrell, DOB [birth year], MRN [id number] PCP: Barbie Banner, MD  [facility] Providers Cardiologist:  Dr.Acharya  }   History of Present Illness: Alisha Terrell is a 82 y.o. female with a history of HTN, asthma, TIA, OSA on CPAP,obesity, chronic back pain, PAF in the setting og lactic acidosis and AKI. Continues on amiodarone and Eliquis along with diltiazem along with lasix prn.  She comes today without any cardiac complaints.  She continues to have complaints of mild lower extremity edema.  Usually dependent.  She denies chest pain, or palpitations.  She is cardiac unaware of A-fib.  ROS: As above otherwise negative.  Studies Reviewed: Marland Kitchen   EKG Interpretation Date/Time:  Friday August 07 2023 10:40:58 EDT Ventricular Rate:  55 PR Interval:  280 QRS Duration:  146 QT Interval:  506 QTC Calculation: 484 R Axis:   2  Text Interpretation: Sinus bradycardia with sinus arrhythmia with 1st degree A-V block Non-specific intra-ventricular conduction block Minimal voltage criteria for LVH, may be normal variant ( Cornell product ) When compared with ECG of 07-Mar-2021 09:19, Sinus rhythm has replaced Atrial fibrillation Vent. rate has decreased BY  59 BPM Non-specific intra-ventricular conduction block has replaced Right bundle branch block Nonspecific T wave abnormality, improved in Anterior leads Confirmed by Joni Reining [id number]) on 08/07/2023 12:52:20 PM      Physical Exam:   VS:  BP 106/70 (BP Location: Left Arm, Patient Position: Sitting, Cuff Size: Normal)   Pulse (!) 55   Ht 4\' 8"  (1.422 m)   Wt 178 lb (80.7 kg)   SpO2 97%   BMI 39.91 kg/m    Wt Readings from Last 3 Encounters:  08/07/23 178 lb (80.7 kg)  02/05/23 174 lb 6.4 oz (79.1 kg)  11/05/22 173 lb 12.8 oz (78.8 kg)    GEN: Well nourished, well developed in no acute distress NECK: No JVD; No carotid bruits CARDIAC: IRRR, no murmurs,  rubs, gallops RESPIRATORY:  Clear to auscultation without rales, wheezing or rhonchi  ABDOMEN: Soft, non-tender, non-distended EXTREMITIES:  No edema; No deformity   ASSESSMENT AND PLAN: .    Paroxysmal atrial fibrillation: Heart rate is well-controlled, to bradycardic.  She remains on amiodarone 200 mg daily, apixaban 5 mg twice daily (samples are provided as she is in the donut hole), metoprolol and diltiazem.  I am checking a TSH and CMET today for surveillance on amiodarone.  May consider decreasing metoprolol if she remains bradycardic and/or symptomatic.  I have reviewed Eliquis calculation and she is on the appropriate and she is on appropriate dose.  2.  Hypertension: Low normal blood pressure.  No changes in her regimen at this time.  If she becomes dizzy or lightheaded she is to report this to [place] so that we may adjust medic patients.  3.  OSA, on CPAP.  Reports compliance.       Signed, Bettey Mare. Liborio Nixon, ANP, AACC

## 2023-08-07 ENCOUNTER — Ambulatory Visit: Payer: Medicare HMO | Attending: Adult Health | Admitting: Adult Health

## 2023-08-07 ENCOUNTER — Encounter: Payer: Self-pay | Admitting: Adult Health

## 2023-08-07 VITALS — BP 106/70 | HR 55 | Ht <= 58 in | Wt 178.0 lb

## 2023-08-07 DIAGNOSIS — I10 Essential (primary) hypertension: Secondary | ICD-10-CM

## 2023-08-07 DIAGNOSIS — G459 Transient cerebral ischemic attack, unspecified: Secondary | ICD-10-CM

## 2023-08-07 DIAGNOSIS — G4733 Obstructive sleep apnea (adult) (pediatric): Secondary | ICD-10-CM | POA: Diagnosis not present

## 2023-08-07 DIAGNOSIS — I480 Paroxysmal atrial fibrillation: Secondary | ICD-10-CM | POA: Diagnosis not present

## 2023-08-07 LAB — COMPREHENSIVE METABOLIC PANEL
ALT: 14 [IU]/L (ref 0–32)
AST: 28 [IU]/L (ref 0–40)
Albumin: 4 g/dL (ref 3.7–4.7)
Alkaline Phosphatase: 64 [IU]/L (ref 44–121)
BUN/Creatinine Ratio: 17 (ref 12–28)
BUN: 17 mg/dL (ref 8–27)
Bilirubin Total: 0.3 mg/dL (ref 0.0–1.2)
CO2: 21 mmol/L (ref 20–29)
Calcium: 8.7 mg/dL (ref 8.7–10.3)
Chloride: 101 mmol/L (ref 96–106)
Creatinine, Ser: 1.03 mg/dL — ABNORMAL HIGH (ref 0.57–1.00)
Globulin, Total: 2.3 g/dL (ref 1.5–4.5)
Glucose: 103 mg/dL — ABNORMAL HIGH (ref 70–99)
Potassium: 5.2 mmol/L (ref 3.5–5.2)
Sodium: 136 mmol/L (ref 134–144)
Total Protein: 6.3 g/dL (ref 6.0–8.5)
eGFR: 54 mL/min/{1.73_m2} — ABNORMAL LOW (ref >59)

## 2023-08-07 LAB — TSH: TSH: 4.01 u[IU]/mL (ref 0.450–4.500)

## 2023-08-07 MED ORDER — APIXABAN 5 MG PO TABS: 5.0000 mg | ORAL_TABLET | Freq: Two times a day (BID) | ORAL | 0 refills | Status: DC

## 2023-08-07 NOTE — Patient Instructions (Signed)
Medication Instructions:  No Changes *If you need a refill on your cardiac medications before your next appointment, please call your pharmacy*   Lab Work: CMET, TSH If you have labs (blood work) drawn today and your tests are completely normal, you will receive your results only by: MyChart Message (if you have MyChart) OR A paper copy in the mail If you have any lab test that is abnormal or we need to change your treatment, we will call you to review the results.   Testing/Procedures: No Testing  Follow-Up: At [facility], you and your health needs are our priority.  As part of our continuing mission to provide you with exceptional heart care, we have created designated Provider Care Teams.  These Care Teams include your primary Cardiologist (physician) and Advanced Practice Providers (APPs -  Physician Assistants and Nurse Practitioners) who all work together to provide you with the care you need, when you need it.  We recommend signing up for the patient portal called "MyChart".  Sign up information is provided on this After Visit Summary.  MyChart is used to connect with patients for Virtual Visits (Telemedicine).  Patients are able to view lab/test results, encounter notes, upcoming appointments, etc.  Non-urgent messages can be sent to your provider as well.   To learn more about what you can do with MyChart, go to [email or web address].    Your next appointment:   6 month(s)  Provider:   Parke Poisson, MD

## 2023-08-10 ENCOUNTER — Telehealth: Payer: Self-pay

## 2023-08-10 NOTE — Telephone Encounter (Addendum)
Called patient regarding results. Patient had understanding of results.----- Message from Joni Reining sent at 08/09/2023  1:50 PM EDT ----- I have reviewed her labs.  Kidney function is slightly elevated compared to 1 year ago.  Also potassium is slightly elevated.  This may be related to the blood draw and not necessarily real high potassium.  TSH is normal so she can continue the amiodarone.  Liver function is normal.  Continue her current medication regimen.  She will need to have follow-up labs in 6 months if not completed by primary.

## 2023-10-08 ENCOUNTER — Emergency Department (HOSPITAL_BASED_OUTPATIENT_CLINIC_OR_DEPARTMENT_OTHER)
Admission: EM | Admit: 2023-10-08 | Discharge: 2023-10-08 | Disposition: A | Discharge status: Home / Self Care | Payer: Medicare HMO | Attending: Emergency Medicine | Admitting: Emergency Medicine

## 2023-10-08 ENCOUNTER — Encounter (HOSPITAL_BASED_OUTPATIENT_CLINIC_OR_DEPARTMENT_OTHER): Payer: Self-pay | Admitting: Emergency Medicine

## 2023-10-08 ENCOUNTER — Other Ambulatory Visit (HOSPITAL_BASED_OUTPATIENT_CLINIC_OR_DEPARTMENT_OTHER): Payer: Self-pay

## 2023-10-08 ENCOUNTER — Emergency Department (HOSPITAL_BASED_OUTPATIENT_CLINIC_OR_DEPARTMENT_OTHER): Payer: Medicare HMO | Admitting: Radiology

## 2023-10-08 ENCOUNTER — Other Ambulatory Visit: Payer: Self-pay

## 2023-10-08 DIAGNOSIS — M25572 Pain in left ankle and joints of left foot: Secondary | ICD-10-CM | POA: Insufficient documentation

## 2023-10-08 DIAGNOSIS — Z7901 Long term (current) use of anticoagulants: Secondary | ICD-10-CM | POA: Insufficient documentation

## 2023-10-08 MED ORDER — TRAMADOL HCL 50 MG PO TABS
50.0000 mg | ORAL_TABLET | Freq: Once | ORAL | Status: AC
  Administered 2023-10-08: 50 mg via ORAL
  Filled 2023-10-08: qty 1

## 2023-10-08 MED ORDER — PREDNISONE 50 MG PO TABS
50.0000 mg | ORAL_TABLET | Freq: Every day | ORAL | 0 refills | Status: AC
Start: 2023-10-08 — End: 2023-10-13
  Filled 2023-10-08: qty 5, 5d supply, fill #0

## 2023-10-08 NOTE — ED Provider Notes (Signed)
[place] EMERGENCY DEPARTMENT AT [facility] Provider Note   CSN: [id number] Arrival date & time: 10/08/23  1127     History  Chief Complaint  Patient presents with   Foot Pain    Alisha Terrell is a 82 y.o. female.  82 year old female presents with pain in her left ankle.  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  She denies any history of trauma.  States she uses a rollator when she ambulates normally.  Has any fever or chills.  Patient is on Eliquis and has been compliant and she takes it for A-fib.  No shortness of breath.  No pain at rest only when she stands on it.  Denies any hip or knee pain       Home Medications Prior to Admission medications   Medication Sig Start Date End Date Taking? Authorizing Provider  acetaminophen (TYLENOL) 500 MG tablet Take 500 mg by mouth every 6 (six) hours as needed. Two Tablets Every 6 hours as Needed For Pain    [provider]  ADVAIR DISKUS 250-50 MCG/DOSE AEPB Inhale 1 puff into the lungs 2 (two) times daily.  08/22/15   [provider]  amiodarone (PACERONE) 200 MG tablet Take 1 tablet (200 mg total) by mouth daily. 05/18/23   Parke Poisson, MD  apixaban (ELIQUIS) 5 MG TABS tablet Take 1 tablet (5 mg total) by mouth 2 (two) times daily. 06/01/23   Parke Poisson, MD  apixaban (ELIQUIS) 5 MG TABS tablet Take 1 tablet (5 mg total) by mouth 2 (two) times daily. 08/07/23   Jodelle Gross, NP  Azelastine HCl 137 MCG/SPRAY SOLN Place 1 spray into both nostrils daily. 03/12/22   [provider]  Dextromethorphan-Guaifenesin (ROBITUSSIN COUGH/CHEST DM MAX PO) Take 1 mL by mouth 2 (two) times daily as needed (for cold symptoms).     [provider]  diclofenac Sodium (VOLTAREN) 1 % GEL Apply 2 g topically at bedtime. 08/16/19   [provider]  diltiazem (CARDIZEM CD) 240 MG 24 hr capsule Take 1 capsule (240 mg total) by mouth daily. 03/15/21   Marinda Elk, MD  fluticasone (FLONASE) 50 MCG/ACT nasal spray Place 1 spray into both nostrils at bedtime.  09/26/15   [provider]  furosemide (LASIX) 20 MG tablet Take 1 tablet (20 mg total) by mouth daily as needed. 12/05/22   Parke Poisson, MD  ibandronate (BONIVA) 150 MG tablet Take 150 mg by mouth every 30 (thirty) days.  08/16/19   [provider]  lidocaine (LMX) 4 % cream Apply 1 Application topically as needed (back pain).    [provider]  losartan (COZAAR) 100 MG tablet TAKE 1 TABLET BY MOUTH DAILY 01/05/23   Parke Poisson, MD  melatonin 5 MG TABS Take 5 mg by mouth.    [provider]  metoprolol tartrate (LOPRESSOR) 50 MG tablet Take 1 tablet (50 mg total) by mouth 2 (two) times daily. 03/28/22   Jodelle Gross, NP  montelukast (SINGULAIR) 10 MG tablet Take 10 mg by mouth at bedtime. 09/10/15   [provider]  Multiple Vitamins-Minerals (CENTRUM SILVER ADULT 50+ PO) Take 1 tablet by mouth daily.    [provider]  omeprazole (PRILOSEC) 40 MG capsule Take 40 mg by mouth in the morning and at bedtime.  09/10/15   [provider]  ondansetron (ZOFRAN-ODT) 4 MG disintegrating tablet Take 4 mg by mouth every 8 (eight)  hours as needed for vomiting or nausea. 03/22/21   [provider]  PROAIR HFA 108 (90 Base) MCG/ACT inhaler Inhale 1-2 puffs into the lungs every 6 (six) hours as needed for wheezing or shortness of breath.  10/05/15   [provider]  traMADol (ULTRAM) 50 MG tablet Take 50 mg by mouth every 6 (six) hours as needed for moderate pain or severe pain. 03/23/19   [provider]      Allergies    Codeine and Omnicef [cefdinir]    Review of Systems   Review of Systems  All other systems reviewed and are negative.   Physical Exam Updated Vital Signs BP (!) 186/100   Pulse 73   Temp 97.9 F (36.6 C)   Resp 16   Wt 78 kg   SpO2 100%   BMI 38.56 kg/m  Physical  Exam Vitals and nursing note reviewed.  Constitutional:      General: She is not in acute distress.    Appearance: Normal appearance. She is well-developed. She is not toxic-appearing.  HENT:     Head: Normocephalic and atraumatic.  Eyes:     General: Lids are normal.     Conjunctiva/sclera: Conjunctivae normal.     Pupils: Pupils are equal, round, and reactive to light.  Neck:     Thyroid: No thyroid mass.     Trachea: No tracheal deviation.  Cardiovascular:     Rate and Rhythm: Normal rate and regular rhythm.     Heart sounds: Normal heart sounds. No murmur heard.    No gallop.  Pulmonary:     Effort: Pulmonary effort is normal. No respiratory distress.     Breath sounds: Normal breath sounds. No stridor. No decreased breath sounds, wheezing, rhonchi or rales.  Abdominal:     General: There is no distension.     Palpations: Abdomen is soft.     Tenderness: There is no abdominal tenderness. There is no rebound.  Musculoskeletal:     Cervical back: Normal range of motion and neck supple.     Left ankle: Tenderness present. Decreased range of motion.     Comments: Neurovasc intact.  No evidence of infection at this time  Skin:    General: Skin is warm and dry.     Findings: No abrasion or rash.  Neurological:     Mental Status: She is alert and oriented to person, place, and time. Mental status is at baseline.     GCS: GCS eye subscore is 4. GCS verbal subscore is 5. GCS motor subscore is 6.     Cranial Nerves: No cranial nerve deficit.     Sensory: No sensory deficit.     Motor: Motor function is intact.  Psychiatric:        Attention and Perception: Attention normal.        Speech: Speech normal.        Behavior: Behavior normal.     ED Results / Procedures / Treatments   Labs (all labs ordered are listed, but only abnormal results are displayed) Labs Reviewed - No data to display  EKG None  Radiology No results found.  Procedures Procedures    Medications  Ordered in ED Medications  traMADol (ULTRAM) tablet 50 mg (has no administration in time range)    ED Course/ Medical Decision Making/ A&P  Medical Decision Making Amount and/or Complexity of Data Reviewed Radiology: ordered.  Risk Prescription drug management.   Patient given tramadol for pain here.  X-ray of her left ankle left foot without acute fracture.  Suspect this is from her arthritis.  Patient is on Eliquis.  Low suspicion for DVT.  Will place patient on course of prednisone and give cam walker for comfort        Final Clinical Impression(s) / ED Diagnoses Final diagnoses:  None    Rx / DC Orders ED Discharge Orders     None         Lorre Nick, MD 10/08/23 [phone]

## 2023-10-08 NOTE — ED Notes (Signed)
Cam boot placed on L foot, discharge instructions, prescription and follow up care reviewed and explained, pt verbalized understanding and had no further questions on d/c. Pt caox4, NAD and taken to daughter's POV via wheelchair without incident.

## 2023-10-08 NOTE — ED Triage Notes (Signed)
Pt c/o LT foot and distal LLE pain and swelling since last night. Denies injury

## 2023-10-08 NOTE — ED Notes (Signed)
Ice pack placed on L ankle

## 2023-11-23 ENCOUNTER — Telehealth: Payer: Self-pay | Admitting: Internal Medicine

## 2023-11-23 ENCOUNTER — Other Ambulatory Visit: Payer: Self-pay

## 2023-11-23 MED ORDER — FUROSEMIDE 20 MG PO TABS: 20.0000 mg | ORAL_TABLET | Freq: Every day | ORAL | 2 refills | Status: DC | PRN

## 2023-11-23 NOTE — Telephone Encounter (Signed)
 Called and spoke to patient. Patient reports increase in swelling in BLE . Reports taking taking Furosemide  20 mg this am. Reports eating vegetarian homemade chili, cereal, processed fruits and homemade mac and cheese. Patient reports blood pressure this morning 168/86. Advise patient to increase diuretic X 1  pill for weight gain and appt schedule with APP on 2/17 @2 :45 pm. Made patient aware to take blood pressure daily and continue to elevate legs daily.  Advised patient to stay away from processed and salty foods. Advise patient to weight first thing in morning upon arising out of bed, preferably in same clothes.  Advise patient to continue to taking inhalers as ordered by other providers. Patient verbalized an understanding.  Made patient aware to call office for any other concerns.

## 2023-11-23 NOTE — Telephone Encounter (Signed)
 Pt c/o swelling/edema: STAT if pt has developed SOB within 24 hours  If swelling, where is the swelling located? Legs/feet  How much weight have you gained and in what time span? 6 pds in two weeks  Have you gained 2 pounds in a day or 5 pounds in a week? yes  Do you have a log of your daily weights (if so, list)? Not for two weeks  Are you currently taking a fluid pill? yes  Are you currently SOB? yes  Have you traveled recently in a car or plane for an extended period of time?  no

## 2023-11-29 NOTE — Progress Notes (Unsigned)
  Cardiology Office Note:  .   Date:  11/29/2023  ID:  Alisha Terrell, DOB [birth year], MRN [id number] PCP: Mattie Marlin, DO  [facility] Providers Cardiologist:  Parke Poisson, MD}   }   History of Present Illness: Alisha Terrell is a 83 y.o. female  with a history of HTN, asthma, TIA, OSA on CPAP,obesity, chronic back pain, PAF in the setting og lactic acidosis and AKI. Continues on amiodarone and Eliquis along with diltiazem along with lasix prn. She continues to have complaints of mild lower extremity edema. Usually dependent.  I saw her last on 08/07/2023 and she was stable from a cardiac standpoint.  TSH and CMET were ordered for surveillance on amiodarone.  Both liver function and TSH were normal.  ROS: ***  Studies Reviewed: .        *** EKG Interpretation Date/Time:    Ventricular Rate:    PR Interval:    QRS Duration:    QT Interval:    QTC Calculation:   R Axis:      Text Interpretation:      Physical Exam:   VS:  There were no vitals taken for this visit.   Wt Readings from Last 3 Encounters:  10/08/23 172 lb (78 kg)  08/07/23 178 lb (80.7 kg)  02/05/23 174 lb 6.4 oz (79.1 kg)    GEN: Well nourished, well developed in no acute distress NECK: No JVD; No carotid bruits CARDIAC: ***RRR, no murmurs, rubs, gallops RESPIRATORY:  Clear to auscultation without rales, wheezing or rhonchi  ABDOMEN: Soft, non-tender, non-distended EXTREMITIES:  No edema; No deformity   ASSESSMENT AND PLAN: .   ***    {Are you ordering a CV Procedure (e.g. stress test, cath, DCCV, TEE, etc)?   Press F2        :[id number]}    Signed, Bettey Mare. Liborio Nixon, ANP, AACC

## 2023-11-30 ENCOUNTER — Ambulatory Visit: Payer: Medicare HMO | Attending: Adult Health | Admitting: Adult Health

## 2023-11-30 ENCOUNTER — Encounter (HOSPITAL_COMMUNITY): Payer: Self-pay

## 2023-11-30 ENCOUNTER — Inpatient Hospital Stay (HOSPITAL_COMMUNITY)
Admission: AD | Admit: 2023-11-30 | Discharge: 2023-12-04 | DRG: 291 | Disposition: A | Discharge status: Home / Self Care | Payer: Medicare HMO | Attending: Cardiovascular Disease | Admitting: Cardiovascular Disease

## 2023-11-30 ENCOUNTER — Encounter: Payer: Self-pay | Admitting: Adult Health

## 2023-11-30 VITALS — BP 146/74 | HR 69 | Ht <= 58 in | Wt 184.0 lb

## 2023-11-30 DIAGNOSIS — N1831 Chronic kidney disease, stage 3a: Secondary | ICD-10-CM | POA: Diagnosis present

## 2023-11-30 DIAGNOSIS — Z79899 Other long term (current) drug therapy: Secondary | ICD-10-CM | POA: Diagnosis not present

## 2023-11-30 DIAGNOSIS — I10 Essential (primary) hypertension: Secondary | ICD-10-CM | POA: Diagnosis not present

## 2023-11-30 DIAGNOSIS — G8929 Other chronic pain: Secondary | ICD-10-CM | POA: Insufficient documentation

## 2023-11-30 DIAGNOSIS — Z888 Allergy status to other drugs, medicaments and biological substances status: Secondary | ICD-10-CM | POA: Diagnosis not present

## 2023-11-30 DIAGNOSIS — Z885 Allergy status to narcotic agent status: Secondary | ICD-10-CM | POA: Diagnosis not present

## 2023-11-30 DIAGNOSIS — I130 Hypertensive heart and chronic kidney disease with heart failure and stage 1 through stage 4 chronic kidney disease, or unspecified chronic kidney disease: Secondary | ICD-10-CM | POA: Diagnosis present

## 2023-11-30 DIAGNOSIS — Z8249 Family history of ischemic heart disease and other diseases of the circulatory system: Secondary | ICD-10-CM

## 2023-11-30 DIAGNOSIS — Z6839 Body mass index (BMI) 39.0-39.9, adult: Secondary | ICD-10-CM

## 2023-11-30 DIAGNOSIS — Z7901 Long term (current) use of anticoagulants: Secondary | ICD-10-CM | POA: Diagnosis not present

## 2023-11-30 DIAGNOSIS — I5043 Acute on chronic combined systolic (congestive) and diastolic (congestive) heart failure: Secondary | ICD-10-CM

## 2023-11-30 DIAGNOSIS — I5033 Acute on chronic diastolic (congestive) heart failure: Secondary | ICD-10-CM | POA: Diagnosis present

## 2023-11-30 DIAGNOSIS — Z8673 Personal history of transient ischemic attack (TIA), and cerebral infarction without residual deficits: Secondary | ICD-10-CM | POA: Diagnosis not present

## 2023-11-30 DIAGNOSIS — I493 Ventricular premature depolarization: Secondary | ICD-10-CM | POA: Diagnosis present

## 2023-11-30 DIAGNOSIS — M1990 Unspecified osteoarthritis, unspecified site: Secondary | ICD-10-CM | POA: Diagnosis present

## 2023-11-30 DIAGNOSIS — E6609 Other obesity due to excess calories: Secondary | ICD-10-CM | POA: Diagnosis present

## 2023-11-30 DIAGNOSIS — I480 Paroxysmal atrial fibrillation: Secondary | ICD-10-CM | POA: Diagnosis present

## 2023-11-30 DIAGNOSIS — J4489 Other specified chronic obstructive pulmonary disease: Secondary | ICD-10-CM | POA: Diagnosis present

## 2023-11-30 DIAGNOSIS — J449 Chronic obstructive pulmonary disease, unspecified: Secondary | ICD-10-CM | POA: Diagnosis present

## 2023-11-30 DIAGNOSIS — Z5986 Financial insecurity: Secondary | ICD-10-CM | POA: Diagnosis not present

## 2023-11-30 DIAGNOSIS — N183 Chronic kidney disease, stage 3 unspecified: Secondary | ICD-10-CM | POA: Insufficient documentation

## 2023-11-30 DIAGNOSIS — G4733 Obstructive sleep apnea (adult) (pediatric): Secondary | ICD-10-CM | POA: Diagnosis present

## 2023-11-30 DIAGNOSIS — Z0000 Encounter for general adult medical examination without abnormal findings: Principal | ICD-10-CM

## 2023-11-30 DIAGNOSIS — I5031 Acute diastolic (congestive) heart failure: Secondary | ICD-10-CM | POA: Diagnosis not present

## 2023-11-30 DIAGNOSIS — E66812 Obesity, class 2: Secondary | ICD-10-CM | POA: Diagnosis present

## 2023-11-30 LAB — COMPREHENSIVE METABOLIC PANEL
ALT: 19 U/L (ref 0–44)
AST: 25 U/L (ref 15–41)
Albumin: 3.6 g/dL (ref 3.5–5.0)
Alkaline Phosphatase: 52 U/L (ref 38–126)
Anion gap: 13 (ref 5–15)
BUN: 13 mg/dL (ref 8–23)
CO2: 22 mmol/L (ref 22–32)
Calcium: 8.9 mg/dL (ref 8.9–10.3)
Chloride: 97 mmol/L — ABNORMAL LOW (ref 98–111)
Creatinine, Ser: 0.86 mg/dL (ref 0.44–1.00)
GFR, Estimated: >60 mL/min (ref >60)
Glucose, Bld: 102 mg/dL — ABNORMAL HIGH (ref 70–99)
Potassium: 3.5 mmol/L (ref 3.5–5.1)
Sodium: 132 mmol/L — ABNORMAL LOW (ref 135–145)
Total Bilirubin: 0.6 mg/dL (ref 0.0–1.2)
Total Protein: 6.6 g/dL (ref 6.5–8.1)

## 2023-11-30 LAB — CBC WITH DIFFERENTIAL/PLATELET
Abs Immature Granulocytes: 0.03 10*3/uL (ref 0.00–0.07)
Basophils Absolute: 0 10*3/uL (ref 0.0–0.1)
Basophils Relative: 0 %
Eosinophils Absolute: 0.3 10*3/uL (ref 0.0–0.5)
Eosinophils Relative: 3 %
HCT: 32.7 % — ABNORMAL LOW (ref 36.0–46.0)
Hemoglobin: 11.2 g/dL — ABNORMAL LOW (ref 12.0–15.0)
Immature Granulocytes: 0 %
Lymphocytes Relative: 11 %
Lymphs Abs: 1.1 10*3/uL (ref 0.7–4.0)
MCH: 28.9 pg (ref 26.0–34.0)
MCHC: 34.3 g/dL (ref 30.0–36.0)
MCV: 84.5 fL (ref 80.0–100.0)
Monocytes Absolute: 0.9 10*3/uL (ref 0.1–1.0)
Monocytes Relative: 10 %
Neutro Abs: 7.2 10*3/uL (ref 1.7–7.7)
Neutrophils Relative %: 76 %
Platelets: 275 10*3/uL (ref 150–400)
RBC: 3.87 MIL/uL (ref 3.87–5.11)
RDW: 14.6 % (ref 11.5–15.5)
WBC: 9.5 10*3/uL (ref 4.0–10.5)
nRBC: 0 % (ref 0.0–0.2)

## 2023-11-30 LAB — TSH: TSH: 2.773 u[IU]/mL (ref 0.350–4.500)

## 2023-11-30 LAB — BRAIN NATRIURETIC PEPTIDE: B Natriuretic Peptide: 237.4 pg/mL — ABNORMAL HIGH (ref 0.0–100.0)

## 2023-11-30 LAB — MAGNESIUM: Magnesium: 1.9 mg/dL (ref 1.7–2.4)

## 2023-11-30 MED ORDER — LOSARTAN POTASSIUM 50 MG PO TABS
100.0000 mg | ORAL_TABLET | Freq: Every day | ORAL | Status: DC
  Administered 2023-12-01 – 2023-12-04 (×4): 100 mg via ORAL
  Filled 2023-11-30 (×4): qty 2

## 2023-11-30 MED ORDER — POTASSIUM CHLORIDE CRYS ER 20 MEQ PO TBCR: 20.0000 meq | EXTENDED_RELEASE_TABLET | Freq: Two times a day (BID) | ORAL | Status: DC

## 2023-11-30 MED ORDER — SODIUM CHLORIDE 0.9% FLUSH
3.0000 mL | Freq: Two times a day (BID) | INTRAVENOUS | Status: DC
  Administered 2023-11-30 – 2023-12-04 (×8): 3 mL via INTRAVENOUS

## 2023-11-30 MED ORDER — POTASSIUM CHLORIDE CRYS ER 20 MEQ PO TBCR
40.0000 meq | EXTENDED_RELEASE_TABLET | Freq: Every day | ORAL | Status: DC
Start: 2023-11-30 — End: 2023-12-04
  Administered 2023-11-30 – 2023-12-04 (×5): 40 meq via ORAL
  Filled 2023-11-30 (×5): qty 2

## 2023-11-30 MED ORDER — DILTIAZEM HCL ER COATED BEADS 180 MG PO CP24
300.0000 mg | ORAL_CAPSULE | Freq: Every day | ORAL | Status: DC
  Administered 2023-12-01 – 2023-12-04 (×4): 300 mg via ORAL
  Filled 2023-11-30 (×4): qty 1

## 2023-11-30 MED ORDER — ONDANSETRON HCL 4 MG/2ML IJ SOLN: 4.0000 mg | Freq: Four times a day (QID) | INTRAMUSCULAR | Status: DC | PRN

## 2023-11-30 MED ORDER — MONTELUKAST SODIUM 10 MG PO TABS
10.0000 mg | ORAL_TABLET | Freq: Every day | ORAL | Status: DC
  Administered 2023-11-30 – 2023-12-03 (×4): 10 mg via ORAL
  Filled 2023-11-30 (×4): qty 1

## 2023-11-30 MED ORDER — CYCLOBENZAPRINE HCL 5 MG PO TABS
5.0000 mg | ORAL_TABLET | ORAL | Status: AC | PRN
  Administered 2023-11-30: 5 mg via ORAL
  Filled 2023-11-30: qty 1

## 2023-11-30 MED ORDER — SODIUM CHLORIDE 0.9 % IV SOLN: 250.0000 mL | INTRAVENOUS | Status: AC | PRN

## 2023-11-30 MED ORDER — ACETAMINOPHEN 325 MG PO TABS
650.0000 mg | ORAL_TABLET | ORAL | Status: DC | PRN
  Administered 2023-12-01 (×3): 650 mg via ORAL
  Filled 2023-11-30 (×4): qty 2

## 2023-11-30 MED ORDER — METOPROLOL TARTRATE 50 MG PO TABS
50.0000 mg | ORAL_TABLET | Freq: Two times a day (BID) | ORAL | Status: DC
  Administered 2023-11-30 – 2023-12-02 (×4): 50 mg via ORAL
  Filled 2023-11-30 (×4): qty 1

## 2023-11-30 MED ORDER — SODIUM CHLORIDE 0.9% FLUSH: 3.0000 mL | INTRAVENOUS | Status: DC | PRN

## 2023-11-30 MED ORDER — AMIODARONE HCL 200 MG PO TABS
200.0000 mg | ORAL_TABLET | Freq: Every day | ORAL | Status: DC
  Administered 2023-12-01 – 2023-12-04 (×4): 200 mg via ORAL
  Filled 2023-11-30 (×4): qty 1

## 2023-11-30 MED ORDER — PANTOPRAZOLE SODIUM 40 MG PO TBEC
40.0000 mg | DELAYED_RELEASE_TABLET | Freq: Two times a day (BID) | ORAL | Status: DC
  Administered 2023-12-01 – 2023-12-04 (×7): 40 mg via ORAL
  Filled 2023-11-30 (×7): qty 1

## 2023-11-30 MED ORDER — APIXABAN 5 MG PO TABS
5.0000 mg | ORAL_TABLET | Freq: Two times a day (BID) | ORAL | Status: DC
  Administered 2023-11-30 – 2023-12-04 (×8): 5 mg via ORAL
  Filled 2023-11-30 (×8): qty 1

## 2023-11-30 MED ORDER — MELATONIN 3 MG PO TABS
3.0000 mg | ORAL_TABLET | Freq: Every day | ORAL | Status: DC
  Administered 2023-11-30 – 2023-12-03 (×4): 3 mg via ORAL
  Filled 2023-11-30 (×4): qty 1

## 2023-11-30 MED ORDER — FUROSEMIDE 10 MG/ML IJ SOLN
40.0000 mg | Freq: Two times a day (BID) | INTRAMUSCULAR | Status: DC
  Filled 2023-11-30: qty 4

## 2023-11-30 MED ORDER — MOMETASONE FURO-FORMOTEROL FUM 200-5 MCG/ACT IN AERO
2.0000 | INHALATION_SPRAY | Freq: Two times a day (BID) | RESPIRATORY_TRACT | Status: DC
  Administered 2023-11-30 – 2023-12-04 (×8): 2 via RESPIRATORY_TRACT
  Filled 2023-11-30: qty 8.8

## 2023-11-30 MED ORDER — FUROSEMIDE 10 MG/ML IJ SOLN
40.0000 mg | Freq: Two times a day (BID) | INTRAMUSCULAR | Status: AC
  Administered 2023-11-30 – 2023-12-01 (×3): 40 mg via INTRAVENOUS
  Filled 2023-11-30 (×4): qty 4

## 2023-11-30 MED ORDER — FLUTICASONE PROPIONATE 50 MCG/ACT NA SUSP
1.0000 | Freq: Every day | NASAL | Status: DC
  Administered 2023-11-30 – 2023-12-04 (×5): 1 via NASAL
  Filled 2023-11-30: qty 16

## 2023-11-30 MED ORDER — LOSARTAN POTASSIUM 50 MG PO TABS
100.0000 mg | ORAL_TABLET | Freq: Every day | ORAL | Status: DC
  Filled 2023-11-30: qty 2

## 2023-11-30 NOTE — Patient Instructions (Signed)
 Medication Instructions:  No Changes *If you need a refill on your cardiac medications before your next appointment, please call your pharmacy*   Lab Work: No labs If you have labs (blood work) drawn today and your tests are completely normal, you will receive your results only by: MyChart Message (if you have MyChart) OR A paper copy in the mail If you have any lab test that is abnormal or we need to change your treatment, we will call you to review the results.   Testing/Procedures: No Testing   Follow-Up: At [facility], you and your health needs are our priority.  As part of our continuing mission to provide you with exceptional heart care, we have created designated Provider Care Teams.  These Care Teams include your primary Cardiologist (physician) and Advanced Practice Providers (APPs -  Physician Assistants and Nurse Practitioners) who all work together to provide you with the care you need, when you need it.  We recommend signing up for the patient portal called "MyChart".  Sign up information is provided on this After Visit Summary.  MyChart is used to connect with patients for Virtual Visits (Telemedicine).  Patients are able to view lab/test results, encounter notes, upcoming appointments, etc.  Non-urgent messages can be sent to your provider as well.   To learn more about what you can do with MyChart, go to [email or web address].    Your next appointment:   To Be Determined   Provider:   Parke Poisson, MD

## 2023-11-30 NOTE — Progress Notes (Signed)
 Pt. Refused cpap. Pt. States she will have someone to bring her home cpap for tomorrow night.

## 2023-11-30 NOTE — H&P (Signed)
 Cardiology Admission History and Physical   Patient ID: Alisha Terrell MRN: [id number]; DOB: [birth year]   Admission date: 11/30/2023  PCP:  Mattie Marlin, DO   [facility] Providers Cardiologist:  Parke Poisson, MD        Chief Complaint:  LE edema and swelling  Patient Profile:   Alisha Terrell is a 83 y.o. female with a history of HTN, asthma/COPD, TIA, OSA on CPAP, obesity, chronic back pain, PAF in the setting of lactic acidosis and AKI who is being admitted 11/30/2023 for the management of acute on chronic diastolic CHF.  History of Present Illness:   Alisha Terrell was seen in cardiology clinic today, reporting 15 pounds weight gain with lower extremity edema and associated weeping x1 week. She also noted symptoms of orthopnea and increased shortness of breath. Patient reports compliance with home medications including lasix. She took an extra dose within the last week without improvement in edema/dyspnea. Given her symptoms, inpatient management of acute on chronic diastolic CHF recommended.   On evaluation in the hospital today, patient with significant peripheral edema to knees bilaterally. Confirms above that swelling started to worsen a few weeks ago with rapid exacerbation in the last few days. Denies malaise, fever, chills, body aches, known exposure to illness. She has been dealing with a chronic leg pain issue, right side and this prompted PCP visit last week (this is when lasix dose increased). No improvement in edema with extra lasix. Reports generally good urine output though states that she sometimes has the urge to go but has little to no output. She also reports nocturnal dyspnea. Denies chest pain or symptoms of her atrial fibrillation including palpitations/rapid HR.    Past Medical History:  Diagnosis Date   ARF (acute renal failure) (HCC)    2020  resolved  it was presrnt when she was septic   Arthritis    Asthma    CHF (congestive heart  failure) (HCC)    Chronic back pain    Class 2 obesity due to excess calories with body mass index (BMI) of 35.0 to 35.9 in adult    Dyspnea    ocassional   GERD (gastroesophageal reflux disease)    Headache    Hypertension    OSA (obstructive sleep apnea)    PAF (paroxysmal atrial fibrillation) (HCC)    TIA (transient ischemic attack) 2018   no resedule    Past Surgical History:  Procedure Laterality Date   ABDOMINAL SURGERY     CESAREAN SECTION     CHOLECYSTECTOMY     LESION EXCISION WITH COMPLEX REPAIR Left 07/25/2022   Procedure: LESION EXCISION OF LEFT BUCCAL MUCOSA;  Surgeon: Laren Boom, DO;  Location: [facility] OR;  Service: ENT;  Laterality: Left;     Medications Prior to Admission: Prior to Admission medications   Medication Sig Start Date End Date Taking? Authorizing Provider  acetaminophen (TYLENOL) 500 MG tablet Take 500 mg by mouth every 6 (six) hours as needed. Two Tablets Every 6 hours as Needed For Pain    [provider]  ADVAIR DISKUS 250-50 MCG/DOSE AEPB Inhale 1 puff into the lungs 2 (two) times daily.  08/22/15   [provider]  amiodarone (PACERONE) 200 MG tablet Take 1 tablet (200 mg total) by mouth daily. 05/18/23   Parke Poisson, MD  apixaban (ELIQUIS) 5 MG TABS tablet Take 1 tablet (5 mg total) by mouth 2 (two) times daily. 08/07/23   Joni Reining  M, NP  Azelastine HCl 137 MCG/SPRAY SOLN Place 1 spray into both nostrils daily. 03/12/22   [provider]  Dextromethorphan-Guaifenesin (ROBITUSSIN COUGH/CHEST DM MAX PO) Take 1 mL by mouth 2 (two) times daily as needed (for cold symptoms).     [provider]  diclofenac Sodium (VOLTAREN) 1 % GEL Apply 2 g topically at bedtime. 08/16/19   [provider]  diltiazem (CARDIZEM CD) 300 MG 24 hr capsule Take 1 capsule by mouth daily. 09/14/23   [provider]  fluticasone (FLONASE) 50 MCG/ACT nasal spray Place 1 spray into both nostrils at bedtime.   09/26/15   [provider]  furosemide (LASIX) 20 MG tablet Take 1 tablet (20 mg total) by mouth daily as needed. 11/23/23   Parke Poisson, MD  ibandronate (BONIVA) 150 MG tablet Take 150 mg by mouth every 30 (thirty) days.  08/16/19   [provider]  lidocaine (LMX) 4 % cream Apply 1 Application topically as needed (back pain).    [provider]  losartan (COZAAR) 100 MG tablet TAKE 1 TABLET BY MOUTH DAILY 01/05/23   Parke Poisson, MD  melatonin 5 MG TABS Take 5 mg by mouth.    [provider]  metoprolol tartrate (LOPRESSOR) 50 MG tablet Take 1 tablet (50 mg total) by mouth 2 (two) times daily. 03/28/22   Jodelle Gross, NP  montelukast (SINGULAIR) 10 MG tablet Take 10 mg by mouth at bedtime. 09/10/15   [provider]  Multiple Vitamins-Minerals (CENTRUM SILVER ADULT 50+ PO) Take 1 tablet by mouth daily.    [provider]  omeprazole (PRILOSEC) 40 MG capsule Take 40 mg by mouth in the morning and at bedtime.  09/10/15   [provider]  ondansetron (ZOFRAN-ODT) 4 MG disintegrating tablet Take 4 mg by mouth every 8 (eight) hours as needed for vomiting or nausea. 03/22/21   [provider]  PROAIR HFA 108 (90 Base) MCG/ACT inhaler Inhale 1-2 puffs into the lungs every 6 (six) hours as needed for wheezing or shortness of breath.  10/05/15   [provider]  traMADol (ULTRAM) 50 MG tablet Take 50 mg by mouth every 6 (six) hours as needed for moderate pain or severe pain. 03/23/19   [provider]     Allergies:    Allergies  Allergen Reactions   Codeine Rash and Other (See Comments)    Agitation, bad dreams   Omnicef [Cefdinir] Nausea And Vomiting    Confirmed with patient at bedside that no rash - just had significant GI effects    Social History:   Social History   Socioeconomic History   Marital status: Widowed    Spouse name: Ronni Rumble   Number of children: 2   Years of  education: Not on file   Highest education level: Not on file  Occupational History   Occupation: retired  Tobacco Use   Smoking status: Never    Passive exposure: Yes   Smokeless tobacco: Never  Vaping Use   Vaping status: Never Used  Substance and Sexual Activity   Alcohol use: Yes    Alcohol/week: 3.0 standard drinks of alcohol    Types: 3 Glasses of wine per week    Comment: rare   Drug use: No   Sexual activity: Not on file  Other Topics Concern   Not on file  Social History Narrative   Patient is right-handed. She lives with her husband in a one level home. She  does not exercise.   Social Drivers of Health   Financial Resource Strain: Medium Risk (08/15/2021)   Received from [facility] visits prior to 12/13/2022., [facility] visits prior to 12/13/2022.   Overall Financial Resource Strain (CARDIA)    Difficulty of Paying Living Expenses: Somewhat hard  Food Insecurity: Low Risk  (11/24/2023)   [facility]   Hunger Vital Sign    Worried About Running Out of Food in the Last Year: Never true    Ran Out of Food in the Last Year: Never true  Transportation Needs: No Transportation Needs (11/24/2023)   Received from [employer]    In the past 12 months, has lack of reliable transportation kept you from medical appointments, meetings, work or from getting things needed for daily living? : No  Physical Activity: Insufficiently Active (08/15/2021)   Received from [facility] visits prior to 12/13/2022., [facility] visits prior to 12/13/2022.   Exercise Vital Sign    Days of Exercise per Week: 2 days    Minutes of Exercise per Session: 20 min  Stress: No Stress Concern Present (08/15/2021)   [facility] prior to 12/13/2022., [facility] visits prior to 12/13/2022.   [employer] of Occupational Health  - Occupational Stress Questionnaire    Feeling of Stress : Not at all  Social Connections: Moderately Integrated (08/15/2021)   Received from [facility] visits prior to 12/13/2022., [facility] visits prior to 12/13/2022.   Social Advertising account executive [NHANES]    Frequency of Communication with Friends and Family: More than three times a week    Frequency of Social Gatherings with Friends and Family: Once a week    Attends Religious Services: 1 to 4 times per year    Active Member of [employer] or Organizations: No    Attends Banker Meetings: Not on file    Marital Status: Married  Intimate Partner Violence: Not At Risk (08/15/2021)   [facility] visits prior to 12/13/2022., [facility] visits prior to 12/13/2022.   Humiliation, Afraid, Rape, and Kick questionnaire    Fear of Current or Ex-Partner: No    Emotionally Abused: No    Physically Abused: No    Sexually Abused: No    Family History:   The patient's family history includes Hypertension (age of onset: 87) in her mother; Pneumonia (age of onset: 70) in her father.    ROS:  Please see the history of present illness.  All other ROS reviewed and negative.     Physical Exam/Data:   Vitals:   11/30/23 1822  BP: (!) 172/81  Pulse: 73  Resp: 20  Temp: 98.5 F (36.9 C)  TempSrc: Oral  SpO2: 98%  Weight: 81.8 kg  Height: 4' 8.5" (1.435 m)   No intake or output data in the 24 hours ending 11/30/23 1848    11/30/2023    6:22 PM 11/30/2023    2:56 PM 10/08/2023   12:03 PM  Last 3 Weights  Weight (lbs) 180 lb 5.4 oz 184 lb 172 lb  Weight (kg) 81.8 kg 83.462 kg 78.019 kg     Body mass index is 39.72 kg/m.  General:  Well nourished, well developed, in no acute distress HEENT: normal Neck: body habitus make assessment  difficult, no clear JVP Vascular: No carotid bruits; Distal pulses 2+ bilaterally   Cardiac:   normal S1, S2; RRR; no murmur  Lungs:  bibasilar crackles R>L Abd: soft, nontender, no hepatomegaly  Ext: Significant bilateral LE pitting edema, 3+ and weeping. Skin is erythematous. Musculoskeletal:  No deformities, BUE and BLE strength normal and equal Skin: warm and dry  Neuro:  CNs 2-12 intact, no focal abnormalities noted Psych:  Normal affect    EKG:  ECG pending  Relevant CV Studies:  03/08/2021 TTE  IMPRESSIONS     1. Left ventricular ejection fraction, by estimation, is 55 to 60%. The  left ventricle has normal function. The left ventricle has no regional  wall motion abnormalities. There is mild concentric left ventricular  hypertrophy. Left ventricular diastolic  function could not be evaluated.   2. Right ventricular systolic function is normal. The right ventricular  size is normal. There is mildly elevated pulmonary artery systolic  pressure. The estimated right ventricular systolic pressure is 43.1 mmHg.   3. Left atrial size was mild to moderately dilated.   4. The mitral valve is grossly normal. Trivial mitral valve  regurgitation. No evidence of mitral stenosis.   5. The aortic valve is tricuspid. Aortic valve regurgitation is not  visualized. No aortic stenosis is present.   6. The inferior vena cava is dilated in size with <50% respiratory  variability, suggesting right atrial pressure of 15 mmHg.   Comparison(s): Changes from prior study are noted. Afib is now present.   FINDINGS   Left Ventricle: Left ventricular ejection fraction, by estimation, is 55  to 60%. The left ventricle has normal function. The left ventricle has no  regional wall motion abnormalities. The left ventricular internal cavity  size was normal in size. There is   mild concentric left ventricular hypertrophy. Left ventricular diastolic  function could not be evaluated due to atrial fibrillation. Left  ventricular diastolic function could not be evaluated.   Right Ventricle: The  right ventricular size is normal. No increase in  right ventricular wall thickness. Right ventricular systolic function is  normal. There is mildly elevated pulmonary artery systolic pressure. The  tricuspid regurgitant velocity is 2.65   m/s, and with an assumed right atrial pressure of 15 mmHg, the estimated  right ventricular systolic pressure is 43.1 mmHg.   Left Atrium: Left atrial size was mild to moderately dilated.   Right Atrium: Right atrial size was normal in size.   Pericardium: Trivial pericardial effusion is present.   Mitral Valve: The mitral valve is grossly normal. Trivial mitral valve  regurgitation. No evidence of mitral valve stenosis.   Tricuspid Valve: The tricuspid valve is grossly normal. Tricuspid valve  regurgitation is trivial. No evidence of tricuspid stenosis.   Aortic Valve: The aortic valve is tricuspid. Aortic valve regurgitation is  not visualized. No aortic stenosis is present. Aortic valve mean gradient  measures 6.0 mmHg. Aortic valve peak gradient measures 9.1 mmHg. Aortic  valve area, by VTI measures 1.97  cm.   Pulmonic Valve: The pulmonic valve was grossly normal. Pulmonic valve  regurgitation is not visualized. No evidence of pulmonic stenosis.   Aorta: The aortic root and ascending aorta are structurally normal, with  no evidence of dilitation.   Venous: The inferior vena cava is dilated in size with less than 50%  respiratory variability, suggesting right atrial pressure of 15 mmHg.   IAS/Shunts: The atrial septum is grossly normal.   Laboratory Data:  High  Sensitivity Troponin:  No results for input(s): "TROPONINIHS" in the last 720 hours.    ChemistryNo results for input(s): "NA", "K", "CL", "CO2", "GLUCOSE", "BUN", "CREATININE", "CALCIUM", "MG", "GFRNONAA", "GFRAA", "ANIONGAP" in the last 168 hours.  No results for input(s): "PROT", "ALBUMIN", "AST", "ALT", "ALKPHOS", "BILITOT" in the last 168 hours. Lipids No results for  input(s): "CHOL", "TRIG", "HDL", "LABVLDL", "LDLCALC", "CHOLHDL" in the last 168 hours. HematologyNo results for input(s): "WBC", "RBC", "HGB", "HCT", "MCV", "MCH", "MCHC", "RDW", "PLT" in the last 168 hours. Thyroid No results for input(s): "TSH", "FREET4" in the last 168 hours. BNPNo results for input(s): "BNP", "PROBNP" in the last 168 hours.  DDimer No results for input(s): "DDIMER" in the last 168 hours.   Radiology/Studies:  No results found.   Assessment and Plan:   Acute on chronic diastolic CHF Patient with history of HFpEF, on Lasix PTA but with significant accumulation of LE edema with associated dyspnea. Last TTE in 2022 showed LVEF 55-60% with mild LVH. Patient seen in clinic today and direct admission arranged for IV diuresis. Physical exam with impressive pitting peripheral edema in LE, 3+. Skin erythematous.  Check labs including metabolic panel, CBC, TSH, BMP, UA Start IV lasix 40mg  BID with consideration of MRA on 2/18. If UA shows no evidence of UTI, consider SGLT2  Paroxysmal atrial fibrillation CHA2DS2-VASc Score = 7  Patient with hx afib in 2022 in the setting of infection with AKI. During that admission, had resolution of afib on Amiodarone and this was discontinued. Then had recurrent arrhythmia in 2023 and was put back on Amiodarone 200mg  daily.  Regular rate/rhythm on exam today.  Check ECG Continue Amiodarone 200mg  daily Continue Eliquis 5mg  BID On Diltiazem 300mg  daily PTA, plan to continue this admission Continue Metoprolol Tartrate 50mg  BID  Hypertension Moderate hypertension noted on admission with BP 172/68mmHg. Continue Losartan 100mg  Continue Diltiazem and Metoprolol as above  COPD Continue home ICS/LABA regimen as well as nightly Singulair.  OSA CPAP ordered.  Muscle spasms Patient reports frequent muscles spasms of legs in the last week.  Check electrolytes PRN Flexeril ordered    Risk Assessment/Risk Scores:       New York Heart  Association (NYHA) Functional Class NYHA Class III  CHA2DS2-VASc Score = 7   This indicates a 11.2% annual risk of stroke. The patient's score is based upon: CHF History: 1 HTN History: 1 Diabetes History: 0 Stroke History: 2 Vascular Disease History: 0 Age Score: 2 Gender Score: 1     Code Status: Full Code  Severity of Illness: The appropriate patient status for this patient is INPATIENT. Inpatient status is judged to be reasonable and necessary in order to provide the required intensity of service to ensure the patient's safety. The patient's presenting symptoms, physical exam findings, and initial radiographic and laboratory data in the context of their chronic comorbidities is felt to place them at high risk for further clinical deterioration. Furthermore, it is not anticipated that the patient will be medically stable for discharge from the hospital within 2 midnights of admission.   * I certify that at the point of admission it is my clinical judgment that the patient will require inpatient hospital care spanning beyond 2 midnights from the point of admission due to high intensity of service, high risk for further deterioration and high frequency of surveillance required.*   For questions or updates, please contact [facility] Please consult [email or web address] for contact info under     Signed, Perlie Gold, PA-C  11/30/2023 6:48 PM

## 2023-12-01 ENCOUNTER — Inpatient Hospital Stay (HOSPITAL_COMMUNITY): Payer: Medicare HMO

## 2023-12-01 DIAGNOSIS — I5043 Acute on chronic combined systolic (congestive) and diastolic (congestive) heart failure: Secondary | ICD-10-CM | POA: Diagnosis not present

## 2023-12-01 DIAGNOSIS — I5031 Acute diastolic (congestive) heart failure: Secondary | ICD-10-CM

## 2023-12-01 DIAGNOSIS — I480 Paroxysmal atrial fibrillation: Secondary | ICD-10-CM | POA: Diagnosis not present

## 2023-12-01 LAB — BASIC METABOLIC PANEL
Anion gap: 10 (ref 5–15)
BUN: 8 mg/dL (ref 8–23)
CO2: 23 mmol/L (ref 22–32)
Calcium: 8.6 mg/dL — ABNORMAL LOW (ref 8.9–10.3)
Chloride: 101 mmol/L (ref 98–111)
Creatinine, Ser: 0.96 mg/dL (ref 0.44–1.00)
GFR, Estimated: 59 mL/min — ABNORMAL LOW (ref >60)
Glucose, Bld: 109 mg/dL — ABNORMAL HIGH (ref 70–99)
Potassium: 3.5 mmol/L (ref 3.5–5.1)
Sodium: 134 mmol/L — ABNORMAL LOW (ref 135–145)

## 2023-12-01 LAB — ECHOCARDIOGRAM COMPLETE
Area-P 1/2: 3.56 cm2
Height: 56.5 in
S' Lateral: 3.5 cm
Weight: 2786.61 [oz_av]

## 2023-12-01 MED ORDER — TRAMADOL HCL 50 MG PO TABS
50.0000 mg | ORAL_TABLET | Freq: Two times a day (BID) | ORAL | Status: DC
  Administered 2023-12-01 – 2023-12-04 (×6): 50 mg via ORAL
  Filled 2023-12-01 (×6): qty 1

## 2023-12-01 MED ORDER — DICLOFENAC SODIUM 1 % EX GEL
2.0000 g | Freq: Every day | CUTANEOUS | Status: DC
  Administered 2023-12-01 – 2023-12-03 (×3): 2 g via TOPICAL
  Filled 2023-12-01: qty 100

## 2023-12-01 MED ORDER — BACITRACIN-NEOMYCIN-POLYMYXIN OINTMENT TUBE
TOPICAL_OINTMENT | Freq: Every day | CUTANEOUS | Status: DC
  Filled 2023-12-01: qty 14

## 2023-12-01 MED ORDER — SPIRONOLACTONE 25 MG PO TABS
25.0000 mg | ORAL_TABLET | Freq: Every day | ORAL | Status: DC
  Administered 2023-12-01 – 2023-12-04 (×4): 25 mg via ORAL
  Filled 2023-12-01 (×4): qty 1

## 2023-12-01 MED ORDER — GUAIFENESIN-DM 100-10 MG/5ML PO SYRP
5.0000 mL | ORAL_SOLUTION | ORAL | Status: DC | PRN
  Administered 2023-12-01 – 2023-12-03 (×3): 5 mL via ORAL
  Filled 2023-12-01 (×4): qty 5

## 2023-12-01 NOTE — Plan of Care (Signed)
   Problem: Clinical Measurements: Goal: Ability to maintain clinical measurements within normal limits will improve Outcome: Progressing

## 2023-12-01 NOTE — Plan of Care (Signed)

## 2023-12-01 NOTE — Progress Notes (Addendum)
 Rounding Note    Patient Name: Alisha Terrell Date of Encounter: 12/01/2023  [facility] Cardiologist: Parke Poisson, MD   Subjective   No acute overnight events. She diuresed very well overnight and states the swelling in her legs has greatly improved. She still has some shortness of breath and orthopnea. No chest pain or palpitations.  She uses Voltaren gel at home for the arthritis in her hands and asked if we could order this here. She also requested some Neosporin for soreness around her fingernail (sounds like she recently had paronychia). Does not look infected at this time. Will place order for both.  Inpatient Medications    Scheduled Meds:  amiodarone  200 mg Oral Daily   apixaban  5 mg Oral BID   diltiazem  300 mg Oral Daily   fluticasone  1 spray Each Nare Daily   furosemide  40 mg Intravenous BID   losartan  100 mg Oral Daily   melatonin  3 mg Oral QHS   metoprolol tartrate  50 mg Oral BID   mometasone-formoterol  2 puff Inhalation BID   montelukast  10 mg Oral QHS   pantoprazole  40 mg Oral BID AC   potassium chloride  40 mEq Oral Daily   sodium chloride flush  3 mL Intravenous Q12H   Continuous Infusions:  sodium chloride     PRN Meds: sodium chloride, acetaminophen, ondansetron (ZOFRAN) IV, sodium chloride flush   Vital Signs    Vitals:   11/30/23 2015 12/01/23 0008 12/01/23 0424 12/01/23 0821  BP:  (!) 151/89 137/86 (!) 149/65  Pulse:  77 72 76  Resp:  20 19 16   Temp:  98.1 F (36.7 C) 98.4 F (36.9 C)   TempSrc:  Oral Oral   SpO2: 95% 96% 95% 97%  Weight:   79 kg   Height:        Intake/Output Summary (Last 24 hours) at 12/01/2023 0939 Last data filed at 12/01/2023 0851 Gross per 24 hour  Intake 220 ml  Output 3750 ml  Net -3530 ml      12/01/2023    4:24 AM 11/30/2023    6:22 PM 11/30/2023    2:56 PM  Last 3 Weights  Weight (lbs) 174 lb 2.6 oz 180 lb 5.4 oz 184 lb  Weight (kg) 79 kg 81.8 kg 83.462 kg       Telemetry    Normal sinus rhythm with 1st degree AV block. Rates in the 60s to 80s. - Personally Reviewed  ECG    No new ECG tracing today. - Personally Reviewed  Physical Exam   GEN: No acute distress.   Neck: JVD difficult to assess due to body habitus. Cardiac: RRR. No murmurs, rubs, or gallops.  Respiratory: No increased work of breathing. Mild crackles noted in bilateral bases. MS: 1+ pitting edema of bilateral lower extremities. No deformity. Skin: Warm and dry. Neuro:  No focal deficits. Psych: Normal affect.   Labs    High Sensitivity Troponin:  No results for input(s): "TROPONINIHS" in the last 720 hours.   Chemistry Recent Labs  Lab 11/30/23 1856 12/01/23 0345  NA 132* 134*  K 3.5 3.5  CL 97* 101  CO2 22 23  GLUCOSE 102* 109*  BUN 13 8  CREATININE 0.86 0.96  CALCIUM 8.9 8.6*  MG 1.9  --   PROT 6.6  --   ALBUMIN 3.6  --   AST 25  --   ALT 19  --  ALKPHOS 52  --   BILITOT 0.6  --   GFRNONAA >60 59*  ANIONGAP 13 10    Lipids No results for input(s): "CHOL", "TRIG", "HDL", "LABVLDL", "LDLCALC", "CHOLHDL" in the last 168 hours.  Hematology Recent Labs  Lab 11/30/23 1856  WBC 9.5  RBC 3.87  HGB 11.2*  HCT 32.7*  MCV 84.5  MCH 28.9  MCHC 34.3  RDW 14.6  PLT 275   Thyroid  Recent Labs  Lab 11/30/23 1856  TSH 2.773    BNP Recent Labs  Lab 11/30/23 1856  BNP 237.4*    DDimer No results for input(s): "DDIMER" in the last 168 hours.   Radiology    DG Chest Port 1 View Result Date: 12/01/2023 CLINICAL DATA:  [id number] CHF (congestive heart failure), NYHA class I, acute on chronic, combined (HCC) [id number] EXAM: PORTABLE CHEST 1 VIEW COMPARISON:  Mar 03, 2021 FINDINGS: The cardiomediastinal silhouette is unchanged in contour.Perihilar vascular prominence. No pleural effusion. No pneumothorax. Similar background of interstitial prominence with some linear appearing opacities at the LEFT lung base. Coarse calcifications of the RIGHT hepatic  dome. IMPRESSION: Constellation of findings are favored to reflect background of mild pulmonary edema with scattered atelectasis. Electronically Signed   By: Meda Klinefelter M.D.   On: 12/01/2023 08:07    Cardiac Studies   Echocardiogram 03/08/2021: Impression: 1. Left ventricular ejection fraction, by estimation, is 55 to 60%. The  left ventricle has normal function. The left ventricle has no regional  wall motion abnormalities. There is mild concentric left ventricular  hypertrophy. Left ventricular diastolic  function could not be evaluated.   2. Right ventricular systolic function is normal. The right ventricular  size is normal. There is mildly elevated pulmonary artery systolic  pressure. The estimated right ventricular systolic pressure is 43.1 mmHg.   3. Left atrial size was mild to moderately dilated.   4. The mitral valve is grossly normal. Trivial mitral valve  regurgitation. No evidence of mitral stenosis.   5. The aortic valve is tricuspid. Aortic valve regurgitation is not  visualized. No aortic stenosis is present.   6. The inferior vena cava is dilated in size with <50% respiratory  variability, suggesting right atrial pressure of 15 mmHg.   Comparison(s): Changes from prior study are noted. Afib is now present.    Patient Profile     83 y.o. female with a history of paroxysmal atrial fibrillation on Amiodarone and Eliquis, hypertension, TIA, asthma, GERD, obstructive sleep apnea on CPAP, and morbid obesity who admitted from the office on 11/30/2023 for acute on chronic CHF after presenting with significant lower extremity edema, weight gain, and worsening shortness of breath with orthopnea and PND.  Assessment & Plan    Acute on Chronic Diastolic CHF BNP 784. Chest x-ray favored mild pulmonary edema with scattered atelectasis. Last Echo in 02/2021 showed LVEF of 55-60% with mild LHV. She was started on IV Lasix with good urine output. Net negative 3.5L so far. Weight  down 6lbs from yesterday. Renal function stable. - Still volume overloaded but diuresing well. - Repeat Echo pending. - Continue IV Lasix 40mg  twice daily. - Will add Spironolactone 25mg  daily.  - UA is pending. If negative, can likely add SGLT2 inhibitor. - Continue to monitor daily weights, strict I/Os, and renal function.   Paroxysmal Atrial Fibrillation Maintaining sinus rhythm. - Continue home Amiodarone 200mg  daily. - Continue home Cardizem CD 300mg  daily and Lopressor 50mg  twice daily. - Continue chronic anticoagulation with Eliquis 5mg   twice daily.  Hypertension BP mildly elevated. - Continue home Cardizem CD 300mg  daily, Lopressor 50mg  twice daily, and Losartan 100mg  daily. - Will add Spironolactone 25mg  daily.  COPD/ Asthma - Continue home inhalers and Singulair.  Obstructive Sleep Apnea - Continue CPAP. She refused machine here last night and stated she will have someone bring her home machine for tonight.  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 - Will place order for Voltaren gel.   For questions or updates, please contact [facility] Please consult [email or web address] for contact info under        Signed, Corrin Parker, PA-C  12/01/2023, 9:39 AM     Attending Note:   The patient was seen and examined.  Agree with assessment and plan as noted above.  Changes made to the above note as needed.  Patient seen and independently examined with Marjie Skiff, PA .   We discussed all aspects of the encounter. I agree with the assessment and plan as stated above.   Acute on chronic diastolic congestive heart failure: Santina Evans is diuresing very well.  She has put out over 3 L of fluid.  Will continue with her current diuretics.  She will probably be able to be converted back to oral diuretics tomorrow.  2.  Paroxysmal atrial fibrillation: She is continues on amiodarone 2 mg a day.  He is on  Eliquis 5 mg twice a day.  She is maintaining normal sinus rhythm today.  3.  Hypertension: Agree with the addition of spironolactone.  4.  Obstructive sleep apnea: She needs to consider getting a CPAP mask.  Will defer to her primary medical doctor.   I have spent a total of 40 minutes with patient reviewing hospital  notes , telemetry, EKGs, labs and examining patient as well as establishing an assessment and plan that was discussed with the patient.  > 50% of time was spent in direct patient care.    Vesta Mixer, Montez Hageman., MD, [facility] 12/01/2023, 2:40 PM [address]  [address] Office [phone] Pager [phone]

## 2023-12-01 NOTE — Progress Notes (Signed)
 Mobility Specialist Progress Note:   12/01/23 1610  Mobility  Activity Ambulated with assistance in room  Level of Assistance Contact guard assist, steadying assist  Assistive Device Front wheel walker  Distance Ambulated (ft) 5 ft  Activity Response Tolerated well  Mobility Referral Yes  Mobility visit 1 Mobility  Mobility Specialist Start Time (ACUTE ONLY) 1600  Mobility Specialist Stop Time (ACUTE ONLY) 1610  Mobility Specialist Time Calculation (min) (ACUTE ONLY) 10 min   Pt agreeable to mobility session. Required only minG assist to stand and ambulate from recliner to bed. No c/o throughout, pt back in bed with all needs met.   Addison Lank Mobility Specialist Please contact via SecureChat or  Rehab office at [phone]

## 2023-12-01 NOTE — Progress Notes (Signed)
 Mobility Specialist Progress Note:    12/01/23 1604  Mobility  Activity Ambulated with assistance in hallway  Level of Assistance Contact guard assist, steadying assist  Assistive Device Front wheel walker  Distance Ambulated (ft) 80 ft  Activity Response Tolerated well  Mobility Referral Yes  Mobility visit 1 Mobility  Mobility Specialist Start Time (ACUTE ONLY) 1500  Mobility Specialist Stop Time (ACUTE ONLY) 1515  Mobility Specialist Time Calculation (min) (ACUTE ONLY) 15 min   Pt received in bed agreeable to mobility. MinA for bed mobility and contact guard for STS. Was able to ambulate a short distance d/t fatigue. Returned to room w/o fault. Left in chair w/ call bell and personal belongings in reach. All needs met.  Thompson Grayer Mobility Specialist  Please contact vis Secure Chat or  Rehab Office [phone]

## 2023-12-01 NOTE — Progress Notes (Signed)
   Patient is on Tramadol at home for chronic pain. RN asked if this could be ordered here. It does look like she is prescribed Tramadol 50mg  twice daily. I can see from the outside records that this was last dispensed on 10/26/2023 with refills. I tried to look patient up on [place] Controlled Substance Website but was unable to locate patient on there. Will order home dose of Tramadol.  Corrin Parker, PA-C 12/01/2023 6:54 PM

## 2023-12-02 ENCOUNTER — Other Ambulatory Visit (HOSPITAL_COMMUNITY): Payer: Self-pay

## 2023-12-02 ENCOUNTER — Telehealth (HOSPITAL_COMMUNITY): Payer: Self-pay

## 2023-12-02 DIAGNOSIS — I5043 Acute on chronic combined systolic (congestive) and diastolic (congestive) heart failure: Secondary | ICD-10-CM | POA: Diagnosis not present

## 2023-12-02 LAB — BASIC METABOLIC PANEL
Anion gap: 12 (ref 5–15)
BUN: 9 mg/dL (ref 8–23)
CO2: 25 mmol/L (ref 22–32)
Calcium: 8.5 mg/dL — ABNORMAL LOW (ref 8.9–10.3)
Chloride: 97 mmol/L — ABNORMAL LOW (ref 98–111)
Creatinine, Ser: 0.85 mg/dL (ref 0.44–1.00)
GFR, Estimated: >60 mL/min (ref >60)
Glucose, Bld: 107 mg/dL — ABNORMAL HIGH (ref 70–99)
Potassium: 3.8 mmol/L (ref 3.5–5.1)
Sodium: 134 mmol/L — ABNORMAL LOW (ref 135–145)

## 2023-12-02 MED ORDER — METOPROLOL SUCCINATE ER 100 MG PO TB24: 100.0000 mg | ORAL_TABLET | Freq: Every day | ORAL | Status: DC

## 2023-12-02 MED ORDER — FUROSEMIDE 10 MG/ML IJ SOLN
40.0000 mg | Freq: Once | INTRAMUSCULAR | Status: AC
  Administered 2023-12-02: 40 mg via INTRAVENOUS
  Filled 2023-12-02: qty 4

## 2023-12-02 MED ORDER — METOPROLOL TARTRATE 50 MG PO TABS
50.0000 mg | ORAL_TABLET | Freq: Once | ORAL | Status: AC
  Administered 2023-12-02: 50 mg via ORAL
  Filled 2023-12-02: qty 1

## 2023-12-02 MED ORDER — FUROSEMIDE 10 MG/ML IJ SOLN: 40.0000 mg | Freq: Two times a day (BID) | INTRAMUSCULAR | Status: DC

## 2023-12-02 MED ORDER — METOPROLOL SUCCINATE ER 100 MG PO TB24
100.0000 mg | ORAL_TABLET | Freq: Every day | ORAL | Status: DC
  Administered 2023-12-03: 100 mg via ORAL
  Filled 2023-12-02: qty 1

## 2023-12-02 MED ORDER — DAPAGLIFLOZIN PROPANEDIOL 10 MG PO TABS
10.0000 mg | ORAL_TABLET | Freq: Every day | ORAL | Status: DC
  Administered 2023-12-02 – 2023-12-04 (×3): 10 mg via ORAL
  Filled 2023-12-02 (×3): qty 1

## 2023-12-02 NOTE — Progress Notes (Signed)
 Patient would like to talk to CM about outpatient cardiac rehab options. CM relayed to RN that patient will be contacted via phone. RN relayed to patient.

## 2023-12-02 NOTE — Progress Notes (Addendum)
 Heart Failure Stewardship Pharmacist Progress Note   PCP: Mattie Marlin, DO PCP-Cardiologist: Parke Poisson, MD    HPI:  Alisha Terrell is 52 YOF with PMH diastolic HF (EF 55-60%), PAF, HTN, asthma/COPD, TIA, OSA on CPAP, obesity, chronic back pain.  Patient was admitted on 11/30/23 after visiting cardiology clinic with complaints of 1 week of progressive SOB and LEE with reported 15 lbs of weight gain. Reported DOE with minimal activity. Reported compliance with all HF medications and taking extra doses of furosemide without improvement in SOB or edema and minimal increase in urine output. Upon arrival physical exam showed bilateral 2+ pitting LEE up to knees and crackles on lung exam. Denied fever, chills, body aches, or known exposure to illness. Denied chest pain or palpitations. Vitals showed BP 172/81 mmHg, HR 73 bpm, RR 20 rpm, SpO2 98%, afebrile. Labs obtained BNP 237, Scr 0.86, K 3.5, Mag 1.9, Na 132. CXR with mild pulmonary edema and mild atelectasis. ECHO 12/01/23 was unchanged from previous with LVEF 55-60%, LV no RWMA, RV normal. Patient was started on IV furosemide.  Today she is feeling well. Reports not feeling SOB at rest and only having DOE when ambulating today. She has bilateral 1+ LEE. She reports that she had single incident of dizziness this AM while walking but improved with rest. Denies lightheadedness and fatigue. Reports no chest pain or palpitations.  Denies PND or orthopnea. Appetite is okay. Educated patient of her HFpEF and medication management being used. Discussed with patient that she should be eligible for the [employer] grant   Current HF Medications: Diuretic: furosemide 40 mg IV BID Beta Blocker: metoprolol tartrate 50 mg BID ACE/ARB/ARNI: losartan 100 mg daily MRA: spironolactone 25 mg daily Other: Potassium chloride 40 mEq PO daily  Prior to admission HF Medications: Diuretic: furosemide 20 mg daily; patient was taking extra doses of 20-40 mg   immediately PTA Beta blocker: metoprolol tartrate 50 mg BID ACE/ARB/ARNI: losartan 100 mg daily Other: diltiazem CD 300 mg daily  Pertinent Lab Values: Serum creatinine 0.85, BUN 9, Potassium 3.8, Sodium 134, BNP 237, Magnesium 1.9  11/24/23: A1c 5.8%  Vital Signs: Weight: 168 lbs (admission weight: 174 lbs) Blood pressure: 150-160/60-70 mmHg  Heart rate: 70-80 bpm  I/O: net -2.9 L yesterday; net -5.5 L since admission  Medication Assistance / Insurance Benefits Check: Does the patient have prescription insurance?  Yes Type of insurance plan: Medicare Part D  Does the patient qualify for medication assistance through manufacturers or grants?   Yes Eligible grants and/or patient assistance programs: [employer] Grant Approved medication assistance renewals will be completed by: discharge  Outpatient Pharmacy:  Prior to admission outpatient [facility]: [facility] in [place], [place] Is the patient willing to use [facility] TOC pharmacy at discharge? Yes Is the patient willing to transition their outpatient pharmacy to utilize a [facility] outpatient pharmacy?   No    Assessment: 1. Acute on chronic diastolic CHF (LVEF 55-60%), NYHA class I-II symptoms. - Patient is hypervolemic with continued SOB not requiring supplemental oxygen and bilateral 1+ pitting LEE - Patient is hypertensive with stable renal function - optimize RAASi and BB; spironolactone optimized at current dose; consider starting SGLT2i -Serum potassium 3.8; need supplement with continued diuresis   Plan: 1) Medication changes recommended at this time: -Continue furosemide 40 mg IV BID -Start Farxiga 10 mg daily -Continue metoprolol tartrate 50 mg BID; plan to start metoprolol succinate 100 mg/day tomorrow -Continue potassium chloride 40 mEq PO daily  2) Patient assistance: -Application for [employer] started  3)  Education  - Initial education completed - Final education to be completed prior to  discharge  Wilmer Floor, PharmD PGY2 Cardiology Pharmacy Resident Heart Failure Stewardship Phone [phone]

## 2023-12-02 NOTE — Progress Notes (Addendum)
 Patient Name: Alisha Terrell Date of Encounter: 12/02/2023 [facility] Cardiologist: Parke Poisson, MD   Interval Summary  .    83 y.o. female with a PMH of paroxysmal atrial fibrillation on Amiodarone and Eliquis, hypertension, TIA, asthma, GERD, obstructive sleep apnea on CPAP, and morbid obesity,  who was admitted under cardiology service from the office on 11/30/2023 for acute on chronic CHF after presenting with significant lower extremity edema, weight gain, and worsening shortness of breath with orthopnea and PND.   Patient states she is feeling poor this morning, felt quite weak and shaking. She felt improved after eating breakfast. She has not been walking much since admission. She felt her SOB and leg edema had improved.   Vital Signs .    Vitals:   12/02/23 0613 12/02/23 0736 12/02/23 0819 12/02/23 0916  BP: (!) 159/69 (!) 157/71 (!) 150/71   Pulse: 75 73 74   Resp: 17 18 16    Temp: 98.3 F (36.8 C) 97.8 F (36.6 C) 97.6 F (36.4 C)   TempSrc: Oral Oral Oral   SpO2: 95% 97% 97% 96%  Weight: 76.4 kg     Height:        Intake/Output Summary (Last 24 hours) at 12/02/2023 0940 Last data filed at 12/02/2023 1914 Gross per 24 hour  Intake 600 ml  Output 2300 ml  Net -1700 ml      12/02/2023    6:13 AM 12/01/2023    4:24 AM 11/30/2023    6:22 PM  Last 3 Weights  Weight (lbs) 168 lb 6.9 oz 174 lb 2.6 oz 180 lb 5.4 oz  Weight (kg) 76.4 kg 79 kg 81.8 kg      Telemetry/ECG    Sinus rhythm 60-70s, first degree AVB, PVCs  - Personally Reviewed  Physical Exam .   GEN: No acute distress.   Neck: + JVD Cardiac: RRR, no murmurs, rubs, or gallops.  Respiratory: Fine crackles bilaterally, on room air, speaks full sentence  GI: Soft, nontender, non-distended  MS: No edema of BLE   Assessment & Plan .     Acute on Chronic Diastolic CHF - BNP 237. Chest x-ray favored mild pulmonary edema with scattered atelectasis. Echo from yesterday showed LVEF  55-60%, no RWMA, indeterminate diastolic, normal RV, mild MR, mild dilatation of the ascending aorta, measuring 40 mm. IVC dilated.   - She was started on IV Lasix with good urine output. Net negative 5.4L so far. Weight down  from 180.34 to 168.43 ib since admission.  Renal function stable. - volume is improving on exam today, will continue IV Lasix 40mg  BID for another 24 hours, likely transition tomorrow to PO  - GDMT: continue  losartan 100mg , will change metoprolol 50mg  BID to Toprol XL 100mg  daily, continue spironolactone 25mg  daily, will add [place] 10mg  daily today  - Encourage OOB ambulation today, PT to see for weakness, possible DC tomorrow   Paroxysmal Atrial Fibrillation - Maintaining in sinus rhythm. - Continue home Amiodarone 200mg  daily. - Continue home Cardizem CD 300mg  daily, changed lopressor to Toprol XL  as above  - Continue chronic anticoagulation with Eliquis 5mg  twice daily.   Hypertension - BP mildly elevated. - Continue home Cardizem CD 300mg  daily, Lopressor 50mg  twice daily, and Losartan 100mg  daily, added  Spironolactone 25mg  daily this admission    COPD/ Asthma - Continue home inhalers and Singulair. No evidence of acute exacerbation    Obstructive Sleep Apnea - Continue CPAP. She refused machine  here last night and stated she will have someone bring her home machine for tonight.   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 - continue Voltaren gel  DVT prophylaxis - on PTA Eliquis    AD: Full code, since admission Dispo: likely home in 24 hours, OOB ambulate today, PT to see     For questions or updates, please contact [facility] Please consult [email or web address] for contact info under        Signed, Cyndi Bender, NP    Attending Note:   The patient was seen and examined.  Agree with assessment and plan as noted above.  Changes made to the above note as needed.  Patient  seen and independently examined with Cyndi Bender, NP .   We discussed all aspects of the encounter. I agree with the assessment and plan as stated above.    CHF :   she has diuresed > 5 liters over the past 2 days and is now feeling weak.   No leg edema.  Has reduced skin turgur.  Will hold IV lasix .  Contnue spironolactone 25 mg a day  Check BMP tomorrow   2.  PAF :  continue amiodarone 200 mg a day, on eliquis   3.  HTN:  cont current meds .      I have spent a total of 40 minutes with patient reviewing hospital  notes , telemetry, EKGs, labs and examining patient as well as establishing an assessment and plan that was discussed with the patient.  > 50% of time was spent in direct patient care.    Vesta Mixer, Montez Hageman., MD, [facility] 12/02/2023, 11:09 AM [address]  [address] Office [phone] Pager [phone]

## 2023-12-02 NOTE — Telephone Encounter (Signed)
 Advanced Heart Failure Patient Advocate Encounter  The patient was approved for a [employer] grant that will help cover the cost of Entresto, Farxiga / Jardiance.  Total amount awarded, $10,000.  Effective: 11/02/2023 - 10/31/2024.  BIN [id number] PCN [id number] [id number] ID [id number]  Approval and processing information added to [facility]. Patient informed while admitted.  Burnell Blanks, CPhT Rx Patient Advocate Phone: [phone]

## 2023-12-02 NOTE — Progress Notes (Signed)
 Mobility Specialist Progress Note:   12/02/23 0950  Mobility  Activity Ambulated with assistance in room  Level of Assistance Standby assist, set-up cues, supervision of patient - no hands on  Assistive Device Front wheel walker  Distance Ambulated (ft) 15 ft  Activity Response Tolerated well  Mobility Referral Yes  Mobility visit 1 Mobility  Mobility Specialist Start Time (ACUTE ONLY) 0950  Mobility Specialist Stop Time (ACUTE ONLY) 1000  Mobility Specialist Time Calculation (min) (ACUTE ONLY) 10 min   Pt ambulated in room with SV and RW. Declined further ambulation at this time, no specified reason given. Pt back sitting EOB with all needs met.   Addison Lank Mobility Specialist Please contact via SecureChat or  Rehab office at [phone]

## 2023-12-02 NOTE — Evaluation (Signed)
 Physical Therapy Evaluation Patient Details Name: Alisha Terrell MRN: [id number] DOB: June 07, 1941 Today's Date: 12/02/2023  History of Present Illness  83 y.o. female admitted under cardiology service from the office on 11/30/2023 for acute on chronic CHF after presenting with significant lower extremity edema, weight gain, and worsening shortness of breath with orthopnea and PND. PMH of paroxysmal atrial fibrillation on Amiodarone and Eliquis, hypertension, TIA, asthma, GERD, obstructive sleep apnea on CPAP, and morbid obesity.   Clinical Impression  Pt admitted with above diagnosis. PTA, pt was modI for functional mobility using rollator, independent with all ADLs and most IADLs. She resides alone in a one story house with 2 STE and relies on family for transportation. Pt currently with functional limitations due to the deficits listed below (see PT Problem List). Pt required supervision for transfers and CGA for ambulation. Reviewed heart failure education including energy conservation techniques and the importance of daily weight checks. Pt will benefit from acute skilled PT to increase her independence and safety with mobility to allow d/c Home.     If plan is discharge home, recommend the following: A little help with walking and/or transfers;Help with stairs or ramp for entrance;Assist for transportation   Can travel by private vehicle        Equipment Recommendations None recommended by PT (Pt already has DME)  Recommendations for Other Services       Functional Status Assessment Patient has had a recent decline in their functional status and demonstrates the ability to make significant improvements in function in a reasonable and predictable amount of time.     Precautions / Restrictions Precautions Precautions: Fall Recall of Precautions/Restrictions: Intact Restrictions Weight Bearing Restrictions Per Provider Order: No      Mobility  Bed Mobility Overal bed mobility:  Needs Assistance Bed Mobility: Supine to Sit     Supine to sit: HOB elevated, Contact guard     General bed mobility comments: Pt pulled on PT to obtain sitting EOB with HOB elevated ~20deg.    Transfers Overall transfer level: Needs assistance Equipment used: Rolling walker (2 wheels), None Transfers: Sit to/from Stand Sit to Stand: Supervision           General transfer comment: STS from lowest bed height x1. Pt demonstrated proper hand placement on RW. STS from commode x1 with use of grab bar. Good eccentric control with sitting.    Ambulation/Gait Ambulation/Gait assistance: Contact guard assist Gait Distance (Feet): 60 Feet Assistive device: Rolling walker (2 wheels) Gait Pattern/deviations: Step-through pattern, Decreased stride length, Trunk flexed       General Gait Details: Pt ambulated with slow cautious steps and fwd flex posture with RW a little ways in front of her. Cueing to adjust posture. Pt reports given her arthritis this is the only way she can use the AD. Pt became unsteady towards the end of the walk reporting she was begining to feel dizzy.  Stairs            Wheelchair Mobility     Tilt Bed    Modified Rankin (Stroke Patients Only)       Balance Overall balance assessment: Mild deficits observed, not formally tested                                           Pertinent Vitals/Pain Pain Assessment Pain Assessment: 0-10 Pain Score: 5  Pain  Location: Back Pain Descriptors / Indicators: Discomfort, Aching, Sore Pain Intervention(s): Monitored during session    Home Living Family/patient expects to be discharged to:: Private residence Living Arrangements: Alone Available Help at Discharge: Family;Available PRN/intermittently;Neighbor Type of Home: House Home Access: Stairs to enter Entrance Stairs-Rails: Right Entrance Stairs-Number of Steps: 2   Home Layout: One level Home Equipment: BSC/3in1;Rollator (4  wheels);Shower seat;Grab bars - toilet;Grab bars - tub/shower      Prior Function Prior Level of Function : Independent/Modified Independent             Mobility Comments: Pt ambulates using rollator. She tries to avoid steps unless she has supervision or a railing. ADLs Comments: Independent with all ADLs. Manages her own medications and finances. Relies on family for transportation and grocery delievery, but makes her own meals.     Extremity/Trunk Assessment   Upper Extremity Assessment Upper Extremity Assessment: LUE deficits/detail;Overall WFL for tasks assessed;RUE deficits/detail RUE Sensation: decreased light touch (Pt reports this is baseline for her. Hands go numb at night.) LUE Deficits / Details: Reduced AROM secondary to arthritis, but this is baseline for her LUE Sensation: decreased light touch (Pt reports this is baseline for her. Hands go numb at night.)    Lower Extremity Assessment Lower Extremity Assessment: RLE deficits/detail;LLE deficits/detail RLE Deficits / Details: AROM WFL. Hip flex 3/5, Knee ext 3/5, Knee flex 4/5, Ankle DF 4/5. Pt reported pain with resisted motion. RLE Sensation: WNL RLE Coordination: decreased gross motor LLE Deficits / Details: Grossly 4+/5 LLE Sensation: WNL LLE Coordination: WNL    Cervical / Trunk Assessment Cervical / Trunk Assessment: Kyphotic  Communication   Communication Communication: No apparent difficulties    Cognition Arousal: Alert Behavior During Therapy: WFL for tasks assessed/performed   PT - Cognitive impairments: No apparent impairments                       PT - Cognition Comments: Pt A,Ox4. Following commands: Intact       Cueing Cueing Techniques: Verbal cues     General Comments General comments (skin integrity, edema, etc.): Start of Session: BP 163/69 (97), HR 86. End of Session: BP 160/78 (101), HR 92.    Exercises     Assessment/Plan    PT Assessment Patient needs  continued PT services  PT Problem List Decreased strength;Decreased activity tolerance;Decreased balance;Decreased mobility       PT Treatment Interventions DME instruction;Gait training;Stair training;Functional mobility training;Therapeutic activities;Therapeutic exercise;Balance training;Neuromuscular re-education    PT Goals (Current goals can be found in the Care Plan section)  Acute Rehab PT Goals Patient Stated Goal: Return Home PT Goal Formulation: With patient Time For Goal Achievement: 12/16/23 Potential to Achieve Goals: Good    Frequency Min 1X/week     Co-evaluation               AM-PAC PT "6 Clicks" Mobility  Outcome Measure Help needed turning from your back to your side while in a flat bed without using bedrails?: A Little Help needed moving from lying on your back to sitting on the side of a flat bed without using bedrails?: A Little Help needed moving to and from a bed to a chair (including a wheelchair)?: A Little Help needed standing up from a chair using your arms (e.g., wheelchair or bedside chair)?: A Little Help needed to walk in hospital room?: A Little Help needed climbing 3-5 steps with a railing? : A Lot 6 Click Score: 17  End of Session Equipment Utilized During Treatment: Gait belt Activity Tolerance: Patient tolerated treatment well Patient left: in chair;with call bell/phone within reach Nurse Communication: Mobility status PT Visit Diagnosis: Unsteadiness on feet (R26.81);Muscle weakness (generalized) (M62.81);Difficulty in walking, not elsewhere classified (R26.2)    Time: 1610-9604 PT Time Calculation (min) (ACUTE ONLY): 27 min   Charges:   PT Evaluation $PT Eval Low Complexity: 1 Low PT Treatments $Gait Training: 8-22 mins PT General Charges $$ ACUTE PT VISIT: 1 Visit         Cheri Guppy, PT, DPT Acute Rehabilitation Services Office: [phone] Secure Chat Preferred  Alisha Terrell 12/02/2023, 1:12 PM

## 2023-12-02 NOTE — Plan of Care (Signed)
   Problem: Clinical Measurements: Goal: Respiratory complications will improve Outcome: Progressing   Problem: Activity: Goal: Risk for activity intolerance will decrease Outcome: Progressing   Problem: Coping: Goal: Level of anxiety will decrease Outcome: Progressing

## 2023-12-02 NOTE — TOC CM/SW Note (Addendum)
 Transition of Care [facility]) - Inpatient Brief Assessment   Patient Details  Name: Alisha Terrell MRN: [id number] Date of Birth: 1941/03/29  [facility] CM/SW Contact:    Leone Haven, RN Phone Number: 12/02/2023, 1:19 PM   Clinical Narrative: From home alone, has PCP , Adelene Amas and insurance on file, states has no HH services in place at this time, has rollator, bsc, shower chair and regular walker at home.  States daughter will transport them home at [employer] and daughter is support system, states gets medications from [facility] on [address].  Pta self ambulatory with rollator. Per pt eval rec cardiac rehab. NCM notified attending of this will need a consult.   Transition of Care Asessment: Insurance and Status: Insurance coverage has been reviewed Patient has primary care physician: Yes (Alisha Terrell in [place] at [facility]) Home environment has been reviewed: home Prior level of function:: indep Prior/Current Home Services: No current home services Social Drivers of Health Review: SDOH reviewed no interventions necessary Readmission risk has been reviewed: Yes Transition of care needs: no transition of care needs at this time

## 2023-12-03 ENCOUNTER — Other Ambulatory Visit: Payer: Self-pay | Admitting: Home Health

## 2023-12-03 DIAGNOSIS — Z79899 Other long term (current) drug therapy: Secondary | ICD-10-CM

## 2023-12-03 DIAGNOSIS — I5043 Acute on chronic combined systolic (congestive) and diastolic (congestive) heart failure: Secondary | ICD-10-CM | POA: Diagnosis not present

## 2023-12-03 LAB — BASIC METABOLIC PANEL
Anion gap: 10 (ref 5–15)
BUN: 13 mg/dL (ref 8–23)
CO2: 23 mmol/L (ref 22–32)
Calcium: 8.7 mg/dL — ABNORMAL LOW (ref 8.9–10.3)
Chloride: 100 mmol/L (ref 98–111)
Creatinine, Ser: 1.02 mg/dL — ABNORMAL HIGH (ref 0.44–1.00)
GFR, Estimated: 55 mL/min — ABNORMAL LOW (ref >60)
Glucose, Bld: 104 mg/dL — ABNORMAL HIGH (ref 70–99)
Potassium: 4 mmol/L (ref 3.5–5.1)
Sodium: 133 mmol/L — ABNORMAL LOW (ref 135–145)

## 2023-12-03 LAB — MAGNESIUM: Magnesium: 1.9 mg/dL (ref 1.7–2.4)

## 2023-12-03 MED ORDER — ADULT MULTIVITAMIN W/MINERALS CH
1.0000 | ORAL_TABLET | Freq: Every day | ORAL | Status: DC
  Administered 2023-12-04: 1 via ORAL
  Filled 2023-12-03: qty 1

## 2023-12-03 MED ORDER — ALUM & MAG HYDROXIDE-SIMETH 200-200-20 MG/5ML PO SUSP
30.0000 mL | Freq: Four times a day (QID) | ORAL | Status: DC | PRN
  Administered 2023-12-03: 30 mL via ORAL
  Filled 2023-12-03: qty 30

## 2023-12-03 MED ORDER — CALCIUM CARBONATE ANTACID 500 MG PO CHEW
1.0000 | CHEWABLE_TABLET | Freq: Every day | ORAL | Status: DC
  Administered 2023-12-03 – 2023-12-04 (×2): 200 mg via ORAL
  Filled 2023-12-03 (×2): qty 1

## 2023-12-03 MED ORDER — FLUTICASONE FUROATE-VILANTEROL 200-25 MCG/ACT IN AEPB: 1.0000 | INHALATION_SPRAY | Freq: Every day | RESPIRATORY_TRACT | Status: DC

## 2023-12-03 MED ORDER — LIDOCAINE 4 % EX CREA: 1.0000 | TOPICAL_CREAM | CUTANEOUS | Status: DC | PRN

## 2023-12-03 MED ORDER — AZELASTINE HCL 0.1 % NA SOLN
1.0000 | Freq: Every day | NASAL | Status: DC
  Administered 2023-12-04: 1 via NASAL
  Filled 2023-12-03: qty 30

## 2023-12-03 MED ORDER — METOPROLOL TARTRATE 50 MG PO TABS: 50.0000 mg | ORAL_TABLET | Freq: Two times a day (BID) | ORAL | Status: DC

## 2023-12-03 MED ORDER — AMIODARONE HCL 200 MG PO TABS: 200.0000 mg | ORAL_TABLET | Freq: Every day | ORAL | Status: DC

## 2023-12-03 MED ORDER — FLUTICASONE PROPIONATE 50 MCG/ACT NA SUSP: 1.0000 | Freq: Every day | NASAL | Status: DC

## 2023-12-03 MED ORDER — ONDANSETRON 4 MG PO TBDP: 4.0000 mg | ORAL_TABLET | Freq: Three times a day (TID) | ORAL | Status: DC | PRN

## 2023-12-03 MED ORDER — DILTIAZEM HCL ER COATED BEADS 180 MG PO CP24: 300.0000 mg | ORAL_CAPSULE | Freq: Every day | ORAL | Status: DC

## 2023-12-03 MED ORDER — APIXABAN 5 MG PO TABS
5.0000 mg | ORAL_TABLET | Freq: Two times a day (BID) | ORAL | Status: DC
Start: 2023-12-03 — End: 2023-12-03

## 2023-12-03 MED ORDER — APIXABAN 5 MG PO TABS: 5.0000 mg | ORAL_TABLET | Freq: Two times a day (BID) | ORAL | Status: DC

## 2023-12-03 MED ORDER — TORSEMIDE 20 MG PO TABS
20.0000 mg | ORAL_TABLET | Freq: Every day | ORAL | Status: DC
  Administered 2023-12-04: 20 mg via ORAL
  Filled 2023-12-03: qty 1

## 2023-12-03 MED ORDER — LOSARTAN POTASSIUM 50 MG PO TABS
100.0000 mg | ORAL_TABLET | Freq: Every day | ORAL | Status: DC
Start: 2023-12-04 — End: 2023-12-03

## 2023-12-03 MED ORDER — ALBUTEROL SULFATE HFA 108 (90 BASE) MCG/ACT IN AERS
1.0000 | INHALATION_SPRAY | Freq: Four times a day (QID) | RESPIRATORY_TRACT | Status: DC | PRN
Start: 2023-12-03 — End: 2023-12-03

## 2023-12-03 MED ORDER — PANTOPRAZOLE SODIUM 40 MG PO TBEC: 40.0000 mg | DELAYED_RELEASE_TABLET | Freq: Every day | ORAL | Status: DC

## 2023-12-03 MED ORDER — ALBUTEROL SULFATE (2.5 MG/3ML) 0.083% IN NEBU
2.5000 mg | INHALATION_SOLUTION | Freq: Four times a day (QID) | RESPIRATORY_TRACT | Status: DC | PRN
  Administered 2023-12-03: 2.5 mg via RESPIRATORY_TRACT
  Filled 2023-12-03: qty 3

## 2023-12-03 MED ORDER — MONTELUKAST SODIUM 10 MG PO TABS: 10.0000 mg | ORAL_TABLET | Freq: Every day | ORAL | Status: DC

## 2023-12-03 MED ORDER — IBANDRONATE SODIUM 150 MG PO TABS: 150.0000 mg | ORAL_TABLET | ORAL | Status: DC

## 2023-12-03 NOTE — Plan of Care (Signed)
   Problem: Clinical Measurements: Goal: Respiratory complications will improve Outcome: Progressing   Problem: Activity: Goal: Risk for activity intolerance will decrease Outcome: Progressing   Problem: Safety: Goal: Ability to remain free from injury will improve Outcome: Progressing

## 2023-12-03 NOTE — Progress Notes (Signed)
   Patient Name: Alisha Terrell Date of Encounter: 12/03/2023 [facility] Cardiologist: Parke Poisson, MD   Interval Summary  .    83 y.o. female with a PMH of paroxysmal atrial fibrillation on Amiodarone and Eliquis, hypertension, TIA, asthma, GERD, obstructive sleep apnea on CPAP, and morbid obesity,  who was admitted under cardiology service from the office on 11/30/2023 for acute on chronic CHF after presenting with significant lower extremity edema, weight gain, and worsening shortness of breath with orthopnea and PND.   She is diuresed nicely.  She is net -6.9 L so far during this admission.  She was having some leg cramps yesterday but these seem to have resolved.  He has not yet ambulated today.  I would like to get her up and around in potentially get her home tomorrow.  Vital Signs .    Vitals:   12/02/23 2224 12/03/23 0428 12/03/23 0552 12/03/23 0718  BP: (!) 152/79 (!) 152/64  137/85  Pulse: 79 62  65  Resp: 20 20  18   Temp: 98.1 F (36.7 C) 98.5 F (36.9 C)  (!) 97.4 F (36.3 C)  TempSrc: Oral Oral  Oral  SpO2: 96% 96%  98%  Weight:   74.7 kg   Height:        Intake/Output Summary (Last 24 hours) at 12/03/2023 0923 Last data filed at 12/03/2023 0500 Gross per 24 hour  Intake 240 ml  Output 1950 ml  Net -1710 ml      12/03/2023    5:52 AM 12/02/2023    6:13 AM 12/01/2023    4:24 AM  Last 3 Weights  Weight (lbs) 164 lb 10.9 oz 168 lb 6.9 oz 174 lb 2.6 oz  Weight (kg) 74.7 kg 76.4 kg 79 kg      Telemetry/ECG    Sinus rhythm 60-70s, first degree AVB, PVCs  - Personally Reviewed  Physical Exam .   GEN: No acute distress.   Neck: + JVD Cardiac: RRR, no murmurs, rubs, or gallops.  Respiratory: Fine crackles bilaterally, on room air, speaks full sentence  GI: Soft, nontender, non-distended  MS: No edema of BLE   Assessment & Plan .     Acute on Chronic Diastolic CHF  She has diuresed 6.9 liters  Will start her on Torsemide 20 mg  tomorrow - anticipate DC tomorroe   Paroxysmal Atrial Fibrillation  She is maintaining sinus rhythm.  Continue Amio.  Continue Eliquis.   Hypertension BP is well controlled this am  Continue current meds.    COPD/ Asthma Cont inhalers    Obstructive Sleep Apnea - Continue CPAP. She refused machine here last night and stated she will have someone bring her home machine for tonight.   Arthritis She has lots of complaints about her arthritis, back pain, hip pain.  Told her that I would not be able to specifically address any of these chronic issues.  She will need to see her primary medical doctor.  DVT prophylaxis - on PTA Eliquis     For questions or updates, please contact [facility] Please consult [email or web address] for contact info under

## 2023-12-03 NOTE — Progress Notes (Signed)
 Mobility Specialist Progress Note:   12/03/23 0941  Mobility  Activity Ambulated with assistance in hallway  Level of Assistance Contact guard assist, steadying assist  Assistive Device Front wheel walker  Distance Ambulated (ft) 100 ft  Activity Response Tolerated well  Mobility Referral Yes  Mobility visit 1 Mobility  Mobility Specialist Start Time (ACUTE ONLY) 0941  Mobility Specialist Stop Time (ACUTE ONLY) [id number]  Mobility Specialist Time Calculation (min) (ACUTE ONLY) 11 min   Pt agreeable to mobility session. Required only contact assist for safety throughout. Pt left sitting in chair with all needs met.   Addison Lank Mobility Specialist Please contact via SecureChat or  Rehab office at [phone]

## 2023-12-03 NOTE — Progress Notes (Signed)
 Heart Failure Navigator Progress Note  Assessed for Heart & Vascular TOC clinic readiness.  Patient does not meet criteria due to EF 55-60%, has a scheduled [facility] appointment on 12/09/2023. .   Navigator will sign off at this time.   Rhae Hammock, BSN, Scientist, clinical (histocompatibility and immunogenetics) Only

## 2023-12-03 NOTE — Discharge Summary (Incomplete)
 Discharge Summary    Patient ID: Alisha Terrell MRN: [id number]; DOB: June 23, 1941  Admit date: 11/30/2023 Discharge date: 12/04/2023  PCP:  Mattie Marlin, DO   [facility] Providers Cardiologist:  Parke Poisson, MD   Discharge Diagnoses    Principal Problem:   Acute on chronic heart failure with preserved ejection fraction (HFpEF) (HCC) Active Problems:   COPD/ asthma   Hypertension   OSA (obstructive sleep apnea)   Paroxysmal atrial fibrillation (HCC)   Chronic pain   Arthritis   Chronic kidney disease (CKD), stage III (moderate) (HCC)    Diagnostic Studies/Procedures    Echocardiogram 12/01/2023: 1. Left ventricular ejection fraction, by estimation, is 55 to 60%. The  left ventricle has normal function. The left ventricle has no regional  wall motion abnormalities. There is mild concentric left ventricular  hypertrophy. Left ventricular diastolic  parameters are indeterminate.   2. Right ventricular systolic function is normal. The right ventricular  size is severely enlarged. Tricuspid regurgitation signal is inadequate  for assessing PA pressure.   3. Left atrial size was mild to moderately dilated.   4. The mitral valve is grossly normal. Mild mitral valve regurgitation.  No evidence of mitral stenosis.   5. The aortic valve is tricuspid. Aortic valve regurgitation is not  visualized. No aortic stenosis is present.   6. Aortic dilatation noted. There is mild dilatation of the ascending  aorta, measuring 40 mm.   7. The inferior vena cava is dilated in size with >50% respiratory  variability, suggesting right atrial pressure of 8 mmHg.   Comparison(s): Aortic dimensions increased from prior.  _____________   History of Present Illness     Alisha Terrell is a 83 year old female with a history of paroxysmal atrial fibrillation on Amiodarone and Eliquis, hypertension, TIA, asthma, GERD, obstructive sleep apnea on CPAP, and morbid  obesity who admitted from the office on 11/30/2023 for acute on chronic CHF after presenting with significant lower extremity edema, weight gain, and worsening shortness of breath with orthopnea and PND.   Hospital Course     Consultants: N/A    Acute on Chronic Diastolic CHF BNP 409. Chest x-ray favored mild pulmonary edema with scattered atelectasis. Echo showed LVEF of 55-60% with no regional wall motion abnormalities and mild LVH, severe RV with normal RV function, moderate left atrial enlargement, mild MR, and mild dilatation of the ascending aorta measuring 40 mm. She was diuresed with IV Lasix with good urinary response. Net negative 7.3 L this admission. Discharge weight 165 lbs (down from 180 lbs on admission). She will be discharged on Torsemide 20mg  daily (previously on Lasix 20-40mg  daily) and KCl 20 mEq daily. She was started on Spironolactone 25mg  daily and Farxiga 10mg  daily which will be continued at discharge. Recommend repeat BMET at follow-up visit next week.  Of note, patient was seen by PT and cardiac rehab during admission. She was not felt to need any home health PT and did not meet criteria for phase 2 cardiac rehab. Cardiac rehab RN recommended discussing potential Pulmonary Rehab when she sees Pulmonology next month.   Paroxysmal Atrial Fibrillation Maintained sinus rhythm this admission. Home Lopressor was switched to Toprol-XL 100mg  daily. Continue home Amiodarone 200mg  daily and Cardizem CD 300mg  daily. Continue chronic anticoagulation with Eliquis 5mg  twice daily.   Hypertension BP mildly elevated at times. She was started on Spironolactone 25mg  daily. Home Lopressor was switched to Toprol-XL 100mg  daily. Continue home Losartan 100mg   daily and Cardizem CD 300mg  daily. Could consider switching to Coreg or more potent ARB at follow-up visit if BP is still running high.  CKD Stage IIIa Baseline creatinine around 0.9. Creatinine 1.02 prior to discharge on 12/03/2023.  Recommended repeat BMET at follow-up next week.   COPD/ Asthma No evidence of acute exacerbation this admission.  Continue home inhalers and Singulair.    Obstructive Sleep Apnea Continue CPAP. She refused machine here last night and stated she will have someone bring her home machine for tonight.  Chronic Pain Arthritis Patient has chronic pain from arthritis. Continue home Tramadol and Voltaren/ Lidocaine gels.   Patient seen and examined by Dr. Rennis Golden today and determined to be stable for discharge. Outpatient follow-up arranged. Medications as below.  Did the patient have an acute coronary syndrome (MI, NSTEMI, STEMI, etc) this admission?:  No                               Did the patient have a percutaneous coronary intervention (stent / angioplasty)?:  No.    _____________  Discharge Vitals Blood pressure (!) 123/58, pulse 60, temperature 98.2 F (36.8 C), temperature source Oral, resp. rate 18, height 4' 8.5" (1.435 m), weight 75 kg, SpO2 97%.  Filed Weights   12/02/23 0613 12/03/23 0552 12/04/23 0500  Weight: 76.4 kg 74.7 kg 75 kg   General: 83 y.o. Caucasian female resting comfortably in no acute distress. HEENT: Normocephalic and atraumatic. Sclera clear. Neck: Supple. No JVD. Heart: RRR. Distinct S1 and S2. No significant murmurs, gallops, or rubs. Lungs: No increased work of breathing. Very faint bibasilar crackles. Otherwise, clear to ausculation bilaterally.  Abdomen: Soft, non-distended, and non-tender to palpation. Extremities: No lower extremity edema.    Skin: Warm and dry. Neuro: No focal deficits. Psych: Normal affect. Responds appropriately.  Labs & Radiologic Studies    CBC No results for input(s): "WBC", "NEUTROABS", "HGB", "HCT", "MCV", "PLT" in the last 72 hours.  Basic Metabolic Panel Recent Labs    16/10/96 0250 12/03/23 0207  NA 134* 133*  K 3.8 4.0  CL 97* 100  CO2 25 23  GLUCOSE 107* 104*  BUN 9 13  CREATININE 0.85 1.02*  CALCIUM  8.5* 8.7*  MG  --  1.9   Liver Function Tests No results for input(s): "AST", "ALT", "ALKPHOS", "BILITOT", "PROT", "ALBUMIN" in the last 72 hours.  No results for input(s): "LIPASE", "AMYLASE" in the last 72 hours. High Sensitivity Troponin:   No results for input(s): "TROPONINIHS" in the last 720 hours.  BNP Invalid input(s): "POCBNP" D-Dimer No results for input(s): "DDIMER" in the last 72 hours. Hemoglobin A1C No results for input(s): "HGBA1C" in the last 72 hours. Fasting Lipid Panel No results for input(s): "CHOL", "HDL", "LDLCALC", "TRIG", "CHOLHDL", "LDLDIRECT" in the last 72 hours. Thyroid Function Tests No results for input(s): "TSH", "T4TOTAL", "T3FREE", "THYROIDAB" in the last 72 hours.  Invalid input(s): "FREET3"  _____________  ECHOCARDIOGRAM COMPLETE Result Date: 12/01/2023    ECHOCARDIOGRAM REPORT   Patient Name:   JALESHA PLOTZ Date of Exam: 12/01/2023 Medical Rec #:  [id number]          Height:       56.5 in Accession #:    [id number]         Weight:       174.2 lb Date of Birth:  Oct 20, 1940          BSA:  1.685 m Patient Age:    82 years           BP:           137/86 mmHg Patient Gender: F                  HR:           61 bpm. Exam Location:  Inpatient Procedure: 2D Echo, Color Doppler and Cardiac Doppler (Both Spectral and Color            Flow Doppler were utilized during procedure). Indications:    CHF I50.31  History:        Patient has prior history of Echocardiogram examinations, most                 recent 03/08/2021.  Sonographer:    Harriette Bouillon RDCS Referring Phys: [phone] KATHRYN M LAWRENCE IMPRESSIONS  1. Left ventricular ejection fraction, by estimation, is 55 to 60%. The left ventricle has normal function. The left ventricle has no regional wall motion abnormalities. There is mild concentric left ventricular hypertrophy. Left ventricular diastolic parameters are indeterminate.  2. Right ventricular systolic function is normal. The right ventricular  size is severely enlarged. Tricuspid regurgitation signal is inadequate for assessing PA pressure.  3. Left atrial size was mild to moderately dilated.  4. The mitral valve is grossly normal. Mild mitral valve regurgitation. No evidence of mitral stenosis.  5. The aortic valve is tricuspid. Aortic valve regurgitation is not visualized. No aortic stenosis is present.  6. Aortic dilatation noted. There is mild dilatation of the ascending aorta, measuring 40 mm.  7. The inferior vena cava is dilated in size with >50% respiratory variability, suggesting right atrial pressure of 8 mmHg. Comparison(s): Aortic dimensions increased from prior. FINDINGS  Left Ventricle: Left ventricular ejection fraction, by estimation, is 55 to 60%. The left ventricle has normal function. The left ventricle has no regional wall motion abnormalities. Strain imaging was not performed. The left ventricular internal cavity  size was normal in size. There is mild concentric left ventricular hypertrophy. Left ventricular diastolic parameters are indeterminate. Right Ventricle: The right ventricular size is severely enlarged. No increase in right ventricular wall thickness. Right ventricular systolic function is normal. Tricuspid regurgitation signal is inadequate for assessing PA pressure. Left Atrium: Left atrial size was mild to moderately dilated. Right Atrium: Right atrial size was normal in size. Pericardium: There is no evidence of pericardial effusion. Mitral Valve: The mitral valve is grossly normal. Mild mitral valve regurgitation. No evidence of mitral valve stenosis. Tricuspid Valve: The tricuspid valve is normal in structure. Tricuspid valve regurgitation is not demonstrated. Aortic Valve: The aortic valve is tricuspid. Aortic valve regurgitation is not visualized. No aortic stenosis is present. Pulmonic Valve: The pulmonic valve was normal in structure. Pulmonic valve regurgitation is mild. No evidence of pulmonic stenosis. Aorta:  Aortic dilatation noted. There is mild dilatation of the ascending aorta, measuring 40 mm. Venous: The inferior vena cava is dilated in size with greater than 50% respiratory variability, suggesting right atrial pressure of 8 mmHg. IAS/Shunts: The atrial septum is grossly normal. Additional Comments: 3D imaging was not performed.  LEFT VENTRICLE PLAX 2D LVIDd:         5.10 cm   Diastology LVIDs:         3.50 cm   LV e' medial:    4.03 cm/s LV PW:         1.10 cm   LV  E/e' medial:  25.8 LV IVS:        1.20 cm   LV e' lateral:   13.20 cm/s LVOT diam:     2.20 cm   LV E/e' lateral: 7.9 LV SV:         83 LV SV Index:   49 LVOT Area:     3.80 cm  RIGHT VENTRICLE         IVC TAPSE (M-mode): 1.7 cm  IVC diam: 2.10 cm LEFT ATRIUM             Index        RIGHT ATRIUM           Index LA diam:        4.40 cm 2.61 cm/m   RA Area:     17.50 cm LA Vol (A2C):   68.4 ml 40.60 ml/m  RA Volume:   53.90 ml  31.99 ml/m LA Vol (A4C):   68.7 ml 40.77 ml/m LA Biplane Vol: 69.3 ml 41.13 ml/m  AORTIC VALVE LVOT Vmax:   99.70 cm/s LVOT Vmean:  70.500 cm/s LVOT VTI:    0.218 m  AORTA Ao Root diam: 3.20 cm Ao Asc diam:  4.00 cm MITRAL VALVE MV Area (PHT): 3.56 cm     SHUNTS MV Decel Time: 213 msec     Systemic VTI:  0.22 m MV E velocity: 104.00 cm/s  Systemic Diam: 2.20 cm MV A velocity: 72.40 cm/s MV E/A ratio:  1.44 Riley Lam MD Electronically signed by Riley Lam MD Signature Date/Time: 12/01/2023/5:19:50 PM    Final    DG Chest Port 1 View Result Date: 12/01/2023 CLINICAL DATA:  [id number] CHF (congestive heart failure), NYHA class I, acute on chronic, combined (HCC) [id number] EXAM: PORTABLE CHEST 1 VIEW COMPARISON:  Mar 03, 2021 FINDINGS: The cardiomediastinal silhouette is unchanged in contour.Perihilar vascular prominence. No pleural effusion. No pneumothorax. Similar background of interstitial prominence with some linear appearing opacities at the LEFT lung base. Coarse calcifications of the RIGHT hepatic  dome. IMPRESSION: Constellation of findings are favored to reflect background of mild pulmonary edema with scattered atelectasis. Electronically Signed   By: Meda Klinefelter M.D.   On: 12/01/2023 08:07   Disposition   Patient is being discharged home today in good condition.  Follow-up Plans & Appointments     Follow-up Information     Ronney Asters, NP Follow up on 12/09/2023.   Specialty: Cardiology Why: at 825am for your cardiology follow up appointment Contact information: [address] [place] [address] [phone]         [facility] Follow up on 12/07/2023.   Specialty: Cardiology Why: arrive 8-4 for your blood work BMP, no fasting needed, no appointment needed Contact information: [address] [place] [address] [phone]               Discharge Instructions     Amb Referral to HF Clinic   Complete by: As directed    Reason for referral: Post Hosp/TOC   Diet - low sodium heart healthy   Complete by: As directed    Increase activity slowly   Complete by: As directed         Discharge Medications   Allergies as of 12/04/2023       Reactions   Codeine Rash, Other (See Comments)   Agitation, bad dreams   Omnicef [cefdinir] Nausea And Vomiting   Confirmed with  patient at bedside that no rash - just had significant GI effects        Medication List     STOP taking these medications    furosemide 20 MG tablet Commonly known as: LASIX   metoprolol tartrate 50 MG tablet Commonly known as: LOPRESSOR       TAKE these medications    acetaminophen 500 MG tablet Commonly known as: TYLENOL Take 500 mg by mouth every 6 (six) hours as needed. Two Tablets Every 6 hours as Needed For Pain   Advair Diskus 250-50 MCG/DOSE Aepb Generic drug: fluticasone-salmeterol Inhale 1 puff into the lungs 2 (two) times daily.   amiodarone 200 MG tablet Commonly known as:  PACERONE Take 1 tablet (200 mg total) by mouth daily.   apixaban 5 MG Tabs tablet Commonly known as: ELIQUIS Take 1 tablet (5 mg total) by mouth 2 (two) times daily.   Azelastine HCl 137 MCG/SPRAY Soln Place 1 spray into both nostrils daily.   calcium carbonate 500 MG chewable tablet Commonly known as: TUMS - dosed in mg elemental calcium Chew 1 tablet by mouth daily as needed for indigestion or heartburn.   CENTRUM SILVER ADULT 50+ PO Take 1 tablet by mouth in the morning.   dapagliflozin propanediol 10 MG Tabs tablet Commonly known as: FARXIGA Take 1 tablet (10 mg total) by mouth daily.   diclofenac Sodium 1 % Gel Commonly known as: VOLTAREN Apply 2 g topically at bedtime.   diltiazem 300 MG 24 hr capsule Commonly known as: CARDIZEM CD Take 1 capsule by mouth daily.   fluticasone 50 MCG/ACT nasal spray Commonly known as: FLONASE Place 1 spray into both nostrils at bedtime.   ibandronate 150 MG tablet Commonly known as: BONIVA Take 150 mg by mouth every 30 (thirty) days.   lidocaine 4 % cream Commonly known as: LMX Apply 1 Application topically as needed (back pain).   losartan 100 MG tablet Commonly known as: COZAAR TAKE 1 TABLET BY MOUTH DAILY   melatonin 5 MG Tabs Take 5 mg by mouth at bedtime.   metoprolol succinate 100 MG 24 hr tablet Commonly known as: TOPROL-XL Take 1 tablet (100 mg total) by mouth daily. Take with or immediately following a meal.   montelukast 10 MG tablet Commonly known as: SINGULAIR Take 10 mg by mouth at bedtime.   omeprazole 40 MG capsule Commonly known as: PRILOSEC Take 40 mg by mouth in the morning and at bedtime.   ondansetron 4 MG disintegrating tablet Commonly known as: ZOFRAN-ODT Take 4 mg by mouth every 8 (eight) hours as needed for vomiting or nausea.   potassium chloride SA 20 MEQ tablet Commonly known as: KLOR-CON M Take 1 tablet (20 mEq total) by mouth daily.   ProAir HFA 108 (90 Base) MCG/ACT  inhaler Generic drug: albuterol Inhale 1-2 puffs into the lungs at bedtime.   ROBITUSSIN COUGH/CHEST DM MAX PO Take 1 mL by mouth 2 (two) times daily as needed (for cold symptoms).   spironolactone 25 MG tablet Commonly known as: ALDACTONE Take 1 tablet (25 mg total) by mouth daily.   torsemide 20 MG tablet Commonly known as: DEMADEX Take 1 tablet (20 mg total) by mouth daily.   traMADol 50 MG tablet Commonly known as: ULTRAM Take 50 mg by mouth in the morning and at bedtime.           Outstanding Labs/Studies   Repeat BMET at follow-up visit next week.  Duration of Discharge Encounter: APP Time: 54  minutes   Signed, Corrin Parker, PA-C 12/04/2023, 9:04 AM   Attending Note:   The patient was seen and examined.  Agree with assessment and plan as noted above.  Changes made to the above note as needed.  Patient seen and independently examined with Marjie Skiff, PA .   We discussed all aspects of the encounter. I agree with the assessment and plan as stated above.    HFpEF :   she is much better.  We have started Torsemide and spironolactone  She ambulated yesterday without any significant cardiac issues.  She is somewhat limited by orthopedic issues.    She has an appt in a ~ week       I have spent a total of 20  minutes with patient reviewing hospital  notes , telemetry, EKGs, labs and examining patient as well as establishing an assessment and plan that was discussed with the patient.  > 50% of time was spent in direct patient care.    Vesta Mixer, Montez Hageman., MD, [facility] 12/04/2023, 9:56 AM [address]  [address] Office [phone] Pager [phone]

## 2023-12-03 NOTE — Progress Notes (Signed)
 Heart Failure Stewardship Pharmacist Progress Note   PCP: Mattie Marlin, DO PCP-Cardiologist: Parke Poisson, MD    HPI:  Alisha Terrell is 51 YOF with PMH diastolic HF (EF 55-60%), PAF, HTN, asthma/COPD, TIA, OSA on CPAP, obesity, chronic back pain.  Patient was admitted on 11/30/23 after visiting cardiology clinic with complaints of 1 week of progressive SOB and LEE with reported 15 lbs of weight gain. Reported DOE with minimal activity. Reported compliance with all HF medications and taking extra doses of furosemide without improvement in SOB or edema and minimal increase in urine output. Upon arrival physical exam showed bilateral 2+ pitting LEE up to knees and crackles on lung exam. Denied fever, chills, body aches, or known exposure to illness. Denied chest pain or palpitations. Vitals showed BP 172/81 mmHg, HR 73 bpm, RR 20 rpm, SpO2 98%, afebrile. Labs obtained BNP 237, Scr 0.86, K 3.5, Mag 1.9, Na 132. CXR with mild pulmonary edema and mild atelectasis. ECHO 12/01/23 was unchanged from previous with LVEF 55-60%, LV no RWMA, RV normal. Patient was started on IV furosemide.  Today she is feeling well. Reports not feeling SOB at rest and only having DOE when ambulating today. She has bilateral trace LEE. Denies dizziness, lightheadedness and fatigue. Reports no chest pain or palpitations.  Denies PND or orthopnea. Appetite is okay. Discussed with patient that she was approved for the [employer] and she agreed to continue getting medication refills at [facility].    Current HF Medications: Beta Blocker: metoprolol succinate 100 mg daily ACE/ARB/ARNI: losartan 100 mg daily MRA: spironolactone 25 mg daily SGLT2i: Farxiga 10 mg daily Other: Potassium chloride 40 mEq PO daily  Prior to admission HF Medications: Diuretic: furosemide 20 mg daily; patient was taking extra doses of 20-40 mg immediately PTA Beta blocker: metoprolol tartrate 50 mg  BID ACE/ARB/ARNI: losartan 100 mg daily Other: diltiazem CD 300 mg daily  Pertinent Lab Values: Serum creatinine 1.02, BUN 13, Potassium 4.0, Sodium 133, Magnesium 1.9  11/30/23 BNP 237 11/24/23: A1c 5.8%  Vital Signs: Weight: 164 lbs (admission weight: 174 lbs) Blood pressure: 150-160/60-70 mmHg  Heart rate: 60-70 bpm  I/O since admission: incomplete   Medication Assistance / Insurance Benefits Check: Does the patient have prescription insurance?  Yes Type of insurance plan: Medicare Part D  Does the patient qualify for medication assistance through manufacturers or grants?   Yes Eligible grants and/or patient assistance programs: [employer] Approved medication assistance renewals will be completed by: discharge  Outpatient Pharmacy:  Prior to admission outpatient [facility]: [facility] [place], [place] Is the patient willing to use [facility] TOC pharmacy at discharge? Yes Is the patient willing to transition their outpatient pharmacy to utilize a [facility] outpatient pharmacy?   No   Assessment: 1. Acute on chronic diastolic CHF (LVEF 55-60%), NYHA class I-II symptoms. - Patient is slightly hypervolemic with DOE and bilateral trace LEE - Patient is slightly hypertensive with small increase in Scr today - continue current doses of RAASi and BB; conservative optimization given elderly age and increased risk for falls with hypotension -Serum potassium 4.0; Kcl 40 mEq PO daily ordered, monitor Bmet tomorrow   Plan: 1) Medication changes recommended at this time: -Tomorrow, start torsemide 20 mg PO daily -Hold furosemide IV for now -Transitioned from metoprolol tartrate to succinate today; started at 100 mg daily  2) Patient assistance: -Application for [employer] completed; patient to get refills at [facility]  3)  Education  - Initial education completed - Final education to be completed prior to discharge  Wilmer Floor,  PharmD PGY2 Cardiology Pharmacy Resident Heart Failure Stewardship Phone [phone]

## 2023-12-03 NOTE — Progress Notes (Signed)
 CARDIAC REHAB PHASE I   HF education including HF booklet, heart healthy diabetic low sodium diet, exercise guidelines, restrictions, risk factors and CRP2 reviewed. All questions and concerns addressed. Pt not eligible for CRP2. Encourage pt to discuss potential for Pulmonary Rehab with pulmonologist during upcoming appointment.  [phone]  Woodroe Chen, RN BSN 12/03/2023 11:31 AM

## 2023-12-04 ENCOUNTER — Other Ambulatory Visit (HOSPITAL_COMMUNITY): Payer: Self-pay

## 2023-12-04 ENCOUNTER — Other Ambulatory Visit: Payer: Self-pay

## 2023-12-04 DIAGNOSIS — G8929 Other chronic pain: Secondary | ICD-10-CM | POA: Insufficient documentation

## 2023-12-04 DIAGNOSIS — M1990 Unspecified osteoarthritis, unspecified site: Secondary | ICD-10-CM | POA: Insufficient documentation

## 2023-12-04 DIAGNOSIS — N183 Chronic kidney disease, stage 3 unspecified: Secondary | ICD-10-CM | POA: Insufficient documentation

## 2023-12-04 DIAGNOSIS — I5033 Acute on chronic diastolic (congestive) heart failure: Secondary | ICD-10-CM | POA: Diagnosis not present

## 2023-12-04 MED ORDER — METOPROLOL SUCCINATE ER 100 MG PO TB24
100.0000 mg | ORAL_TABLET | Freq: Every day | ORAL | 2 refills | Status: DC
  Filled 2023-12-04: qty 30, 30d supply, fill #0

## 2023-12-04 MED ORDER — TORSEMIDE 20 MG PO TABS
20.0000 mg | ORAL_TABLET | Freq: Every day | ORAL | 2 refills | Status: DC
  Filled 2023-12-04: qty 30, 30d supply, fill #0

## 2023-12-04 MED ORDER — POTASSIUM CHLORIDE CRYS ER 20 MEQ PO TBCR
20.0000 meq | EXTENDED_RELEASE_TABLET | Freq: Every day | ORAL | 2 refills | Status: DC
  Filled 2023-12-04: qty 30, 30d supply, fill #0

## 2023-12-04 MED ORDER — CARVEDILOL 25 MG PO TABS: 25.0000 mg | ORAL_TABLET | Freq: Two times a day (BID) | ORAL | Status: DC

## 2023-12-04 MED ORDER — SPIRONOLACTONE 25 MG PO TABS
25.0000 mg | ORAL_TABLET | Freq: Every day | ORAL | 2 refills | Status: DC
  Filled 2023-12-04: qty 30, 30d supply, fill #0

## 2023-12-04 MED ORDER — METOPROLOL SUCCINATE ER 100 MG PO TB24
100.0000 mg | ORAL_TABLET | Freq: Every day | ORAL | Status: DC
  Administered 2023-12-04: 100 mg via ORAL
  Filled 2023-12-04: qty 1

## 2023-12-04 MED ORDER — DAPAGLIFLOZIN PROPANEDIOL 10 MG PO TABS
10.0000 mg | ORAL_TABLET | Freq: Every day | ORAL | 2 refills | Status: DC
  Filled 2023-12-04: qty 30, 30d supply, fill #0

## 2023-12-04 NOTE — Discharge Instructions (Addendum)
 Medication Changes: - STOP Lasix and START Torsemide 20mg  once daily instead. - STOP Metoprolol tartrate and START Metoprolol succinate 100mg  once daily instead. - START Spironolactone 25mg  daily. - START Farxiga 10mg  daily. - START Potassium chloride 20 mEq daily.  Heart Failure Education: Weigh yourself EVERY morning after you go to the bathroom but before you eat or drink anything. Write this number down in a weight log/diary. If you gain 3 pounds overnight or 5 pounds in a week, call the office. Take your medicines as prescribed. If you have concerns about your medications, please call us before you stop taking them.  Eat low salt foods--Limit salt (sodium) to 2000 mg per day. This will help prevent your body from holding onto fluid. Read food labels as many processed foods have a lot of sodium, especially canned goods and prepackaged meats. If you would like some assistance choosing low sodium foods, we would be happy to set you up with a nutritionist. Limit all fluids for the day to less than 2 liters (64 ounces). Fluid includes all drinks, coffee, juice, ice chips, soup, jello, and all other liquids. Stay as active as you can everyday. Staying active will give you more energy and make your muscles stronger. Start with 5 minutes at a time and work your way up to 30 minutes a day. Break up your activities--do some in the morning and some in the afternoon. Start with 3 days per week and work your way up to 5 days as you can.  If you have chest pain, feel short of breath, dizzy, or lightheaded, STOP. If you don't feel better after a short rest, call 911. If you do feel better, call the office to let us know you have symptoms with exercise.

## 2023-12-04 NOTE — Progress Notes (Addendum)
 Mobility Specialist Progress Note:   12/04/23 1040  Mobility  Activity Ambulated with assistance in hallway  Level of Assistance Contact guard assist, steadying assist  Assistive Device Front wheel walker  Distance Ambulated (ft) 110 ft  Activity Response Tolerated well  Mobility Referral Yes  Mobility visit 1 Mobility  Mobility Specialist Start Time (ACUTE ONLY) 1040  Mobility Specialist Stop Time (ACUTE ONLY) 1050  Mobility Specialist Time Calculation (min) (ACUTE ONLY) 10 min   Pt agreeable to mobility session. Required only contact assist for safety. Pt c/o fatigue towards end of session. Pt back in chair with all needs met, eager for d/c.  Addison Lank Mobility Specialist Please contact via SecureChat or  Rehab office at [phone]

## 2023-12-04 NOTE — Plan of Care (Signed)

## 2023-12-04 NOTE — Progress Notes (Signed)
 Reviewed AVS, patient expressed understanding of medications, MD follow up reviewed.   Removed IV, Site clean, dry and intact.  Picked up medications from [facility] pharmacy. Pt transported to Discharge lounge where family member will pick up to transport home.

## 2023-12-04 NOTE — Progress Notes (Signed)
 Heart Failure Stewardship Pharmacist Progress Note   PCP: Mattie Marlin, DO PCP-Cardiologist: Parke Poisson, MD    HPI:  Alisha Terrell is 69 YOF with PMH diastolic HF (EF 55-60%), PAF, HTN, asthma/COPD, TIA, OSA on CPAP, obesity, chronic back pain.  Patient was admitted on 11/30/23 after visiting cardiology clinic with complaints of 1 week of progressive SOB and LEE with reported 15 lbs of weight gain. Reported DOE with minimal activity. Reported compliance with all HF medications and taking extra doses of furosemide without improvement in SOB or edema and minimal increase in urine output. Upon arrival physical exam showed bilateral 2+ pitting LEE up to knees and crackles on lung exam. Denied fever, chills, body aches, or known exposure to illness. Denied chest pain or palpitations. Vitals showed BP 172/81 mmHg, HR 73 bpm, RR 20 rpm, SpO2 98%, afebrile. Labs obtained BNP 237, Scr 0.86, K 3.5, Mag 1.9, Na 132. CXR with mild pulmonary edema and mild atelectasis. ECHO 12/01/23 was unchanged from previous with LVEF 55-60%, LV no RWMA, RV normal. Patient was started on IV furosemide.  Today she is feeling well. Reports not feeling SOB at rest and only having mild DOE when walking yesterday. She has bilateral trace LEE. Denies dizziness, lightheadedness and fatigue. Reports no chest pain or palpitations.  Denies PND or orthopnea. Appetite is okay.     Discharge HF Medications: Diuretic: torsemide 20 mg PO daily Beta Blocker: metoprolol succinate 100 mg daily ACE/ARB/ARNI: losartan 100 mg daily MRA: spironolactone 25 mg daily SGLT2i: Farxiga 10 mg daily Other: Potassium chloride 20 mEq PO daily; received potassium chloride 40 mEq PO this morning prior to discharge  Prior to admission HF Medications: Diuretic: furosemide 20 mg daily; patient was taking extra doses of 20-40 mg immediately PTA Beta blocker: metoprolol tartrate 50 mg BID ACE/ARB/ARNI: losartan 100 mg daily Other: diltiazem  CD 300 mg daily  Pertinent Lab Values: 12/03/23: Serum creatinine 1.02, BUN 13, Potassium 4.0, Sodium 133, Magnesium 1.9  11/30/23: BNP 237 11/24/23: A1c 5.8%  Vital Signs: Weight: 165 lbs (12/03/23); admission weight: 174 lbs Blood pressure: 150-160/60-70 mmHg  Heart rate: 60-70 bpm  I/O last 24 hours: -0.2 L I/O since admission: incomplete   Medication Assistance / Insurance Benefits Check: Does the patient have prescription insurance?  Yes Type of insurance plan: Medicare Part D  Does the patient qualify for medication assistance through manufacturers or grants?   Yes Eligible grants and/or patient assistance programs: Orthoptist  Outpatient Pharmacy:  Prior to admission outpatient pharmacy: [facility] in [place], [place] Is the patient willing to use [facility] TOC pharmacy at discharge? Yes Is the patient willing to transition their outpatient pharmacy to utilize a [facility] outpatient pharmacy?   Yes   Assessment: 1. Acute on chronic diastolic CHF (LVEF 55-60%), NYHA class I-II symptoms. - Patient is slightly hypervolemic with mild DOE and bilateral trace LEE - diuresis was held yesterday after a few days of IV furosemide, will start torsemide PO today and continue at discharge - Patient is slightly hypertensive with small increase in Scr with labs yesterday - continue current doses of RAASi and BB; conservative optimization given elderly age and increased risk for falls with hypotension -Serum potassium 4.0; diuresis to resume today, continue potassium supplementation   Plan: 1) Medication changes recommended at this time: -Resume diuresis today with torsemide 20 mg PO daily and continue at discharge -Continue all other HF medications at discharge -Reduce potassium chloride from 40 mEq daily  to 20 mEq daily and continue at discharge  2) Patient assistance: -Application for [employer] completed; patient to get refills at [facility]  3)  Education  - Initial education completed - Final education to be completed 12/04/23  Wilmer Floor, PharmD PGY2 Cardiology Pharmacy Resident Heart Failure Stewardship Phone [phone]

## 2023-12-04 NOTE — TOC Transition Note (Signed)
 Transition of Care [facility]) - Discharge Note   Patient Details  Name: Alisha Terrell MRN: [id number] Date of Birth: 02/25/1941  [facility] CM/SW Contact:  Leone Haven, RN Phone Number: 12/04/2023, 10:08 AM   Clinical Narrative:    For dc today, daughter to transport home. TOC pharmacy to fill meds.         Patient Goals and CMS Choice            Discharge Placement                       Discharge Plan and Services Additional resources added to the After Visit Summary for                                       Social Drivers of Health (SDOH) Interventions SDOH Screenings   Food Insecurity: Patient Declined (12/03/2023)  Housing: Low Risk  (12/04/2023)  Transportation Needs: Patient Declined (12/03/2023)  Utilities: Patient Declined (12/03/2023)  Financial Resource Strain: Medium Risk (08/15/2021)   [facility] from [facility] visits prior to 12/13/2022., [facility] visits prior to 12/13/2022.  Physical Activity: Insufficiently Active (08/15/2021)   Received from [facility] visits prior to 12/13/2022., [facility] visits prior to 12/13/2022.  Social Connections: Patient Declined (12/03/2023)  Stress: No Stress Concern Present (08/15/2021)   Received from [facility] visits prior to 12/13/2022., [facility] visits prior to 12/13/2022.  Tobacco Use: Medium Risk (11/30/2023)     Readmission Risk Interventions     No data to display

## 2023-12-07 ENCOUNTER — Other Ambulatory Visit: Payer: Self-pay

## 2023-12-07 DIAGNOSIS — Z79899 Other long term (current) drug therapy: Secondary | ICD-10-CM

## 2023-12-07 NOTE — Progress Notes (Deleted)
 Cardiology Clinic Note   Patient Name: VALITA RIGHTER Date of Encounter: 12/07/2023  Primary Care Provider:  Mattie Marlin, DO Primary Cardiologist:  Parke Poisson, MD  Patient Profile     ATIANA LEVIER 83 year old female presents the clinic today for follow-up evaluation of her acute on chronic CHF.  Past Medical History    Past Medical History:  Diagnosis Date   ARF (acute renal failure) (HCC)    2020  resolved  it was presrnt when she was septic   Arthritis    Asthma    CHF (congestive heart failure) (HCC)    Chronic back pain    Class 2 obesity due to excess calories with body mass index (BMI) of 35.0 to 35.9 in adult    Dyspnea    ocassional   GERD (gastroesophageal reflux disease)    Headache    Hypertension    OSA (obstructive sleep apnea)    PAF (paroxysmal atrial fibrillation) (HCC)    TIA (transient ischemic attack) 2018   no resedule   Past Surgical History:  Procedure Laterality Date   ABDOMINAL SURGERY     CESAREAN SECTION     CHOLECYSTECTOMY     LESION EXCISION WITH COMPLEX REPAIR Left 07/25/2022   Procedure: LESION EXCISION OF LEFT BUCCAL MUCOSA;  Surgeon: Laren Boom, DO;  Location: [facility] OR;  Service: ENT;  Laterality: Left;    Allergies  Allergies  Allergen Reactions   Codeine Rash and Other (See Comments)    Agitation, bad dreams   Omnicef [Cefdinir] Nausea And Vomiting    Confirmed with patient at bedside that no rash - just had significant GI effects    History of Present Illness     LAMYIA CDEBACA has a PMH of paroxysmal atrial fibrillation, HTN, TIA, asthma, GERD, OSA on CPAP, and obesity.  She was admitted from the office to 1725 for acute on chronic CHF.  She presented with significant lower extremity edema, weight gain, and worsening shortness of breath.  She was noted to have orthopnea and PND.  Her chest x-ray showed mild pulmonary edema with scattered atelectasis.  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  She received IV diuresis and her discharge weight was noted to be 165 pounds (down from 180 pounds on admission).  She was discharged on torsemide 20 mg daily along with potassium 20 mill equivalents daily.  She was also started on spironolactone 25 mg and Farxiga 10 mg daily.  She presents the clinic today for follow-up evaluation and states***.  *** denies chest pain, shortness of breath, lower extremity edema, fatigue, palpitations, melena, hematuria, hemoptysis, diaphoresis, weakness, presyncope, syncope, orthopnea, and PND.  Acute on chronic diastolic CHF-weight today***.  Has returned to normal daily activities.  Denies increased shortness of breath and activity intolerance.  Reports compliance with her medications. Heart healthy low-sodium diet-salty 6 diet sheet given Daily weights-weight log Elevate lower extremities when not active Lower extremity support stockings Continue torsemide, potassium, spironolactone, Farxiga Ordered BMP  Paroxysmal atrial fibrillation-heart rate today***.  Denies episodes of accelerated or irregular heartbeat.  Reports compliance with apixaban.  Denies bleeding issues. Continue metoprolol, amiodarone, Cardizem, Eliquis  Essential hypertension-BP today***. Maintain blood pressure log Continue metoprolol, losartan, Cardizem Low-sodium diet  OSA-reports compliance with CP***AP. Continue CPAP use Continue weight loss  CKD stage III-creatinine 2.04 on 12/07/2023.  Avoid nephrotoxic agents Follows with PCP  Disposition: Follow-up with Dr. Jacques Navy or me in ***months.  Home Medications    Prior to Admission medications   Medication Sig Start Date End Date Taking? Authorizing Provider  acetaminophen (TYLENOL) 500 MG tablet Take 500 mg by mouth every 6 (six) hours as needed. Two Tablets Every 6 hours as  Needed For Pain    [provider]  ADVAIR DISKUS 250-50 MCG/DOSE AEPB Inhale 1 puff into the lungs 2 (two) times daily.  08/22/15   [provider]  amiodarone (PACERONE) 200 MG tablet Take 1 tablet (200 mg total) by mouth daily. 05/18/23   Parke Poisson, MD  apixaban (ELIQUIS) 5 MG TABS tablet Take 1 tablet (5 mg total) by mouth 2 (two) times daily. 08/07/23   Jodelle Gross, NP  Azelastine HCl 137 MCG/SPRAY SOLN Place 1 spray into both nostrils daily. 03/12/22   [provider]  calcium carbonate (TUMS - DOSED IN MG ELEMENTAL CALCIUM) 500 MG chewable tablet Chew 1 tablet by mouth daily as needed for indigestion or heartburn.    [provider]  dapagliflozin propanediol (FARXIGA) 10 MG TABS tablet Take 1 tablet (10 mg total) by mouth daily. 12/04/23   Corrin Parker, PA-C  Dextromethorphan-Guaifenesin (ROBITUSSIN COUGH/CHEST DM MAX PO) Take 1 mL by mouth 2 (two) times daily as needed (for cold symptoms).     [provider]  diclofenac Sodium (VOLTAREN) 1 % GEL Apply 2 g topically at bedtime. 08/16/19   [provider]  diltiazem (CARDIZEM CD) 300 MG 24 hr capsule Take 1 capsule by mouth daily. 09/14/23   [provider]  fluticasone (FLONASE) 50 MCG/ACT nasal spray Place 1 spray into both nostrils at bedtime.  09/26/15   [provider]  ibandronate (BONIVA) 150 MG tablet Take 150 mg by mouth every 30 (thirty) days.  08/16/19   [provider]  lidocaine (LMX) 4 % cream Apply 1 Application topically as needed (back pain).    [provider]  losartan (COZAAR) 100 MG tablet TAKE 1 TABLET BY MOUTH DAILY 01/05/23   Parke Poisson, MD  melatonin 5 MG TABS Take 5 mg by mouth at bedtime.    [provider]  metoprolol succinate (TOPROL-XL) 100 MG 24 hr tablet Take 1 tablet (100 mg total) by mouth daily. Take with or immediately following a meal. 12/04/23   Marjie Skiff E, PA-C  montelukast  (SINGULAIR) 10 MG tablet Take 10 mg by mouth at bedtime. 09/10/15   [provider]  Multiple Vitamins-Minerals (CENTRUM SILVER ADULT 50+ PO) Take 1 tablet by mouth in the morning.    [provider]  omeprazole (PRILOSEC) 40 MG capsule Take 40 mg by mouth in the morning and at bedtime.  09/10/15   [provider]  ondansetron (ZOFRAN-ODT) 4 MG disintegrating tablet Take 4 mg by mouth every 8 (eight) hours as needed for vomiting or nausea. Patient not taking: Reported on 11/30/2023 03/22/21   [provider]  potassium chloride SA (KLOR-CON M) 20 MEQ tablet Take 1 tablet (20 mEq total) by mouth daily. 12/04/23   Corrin Parker, PA-C  PROAIR HFA 108 [phone] Base) MCG/ACT inhaler Inhale 1-2 puffs into the lungs at bedtime. 10/05/15   [provider]  spironolactone (ALDACTONE) 25 MG tablet Take 1 tablet (25 mg total) by mouth daily. 12/04/23   Corrin Parker, PA-C  torsemide (DEMADEX) 20 MG tablet Take  1 tablet (20 mg total) by mouth daily. 12/04/23   Corrin Parker, PA-C  traMADol (ULTRAM) 50 MG tablet Take 50 mg by mouth in the morning and at bedtime. 03/23/19   [provider]    Family History    Family History  Problem Relation Age of Onset   Hypertension Mother 86   Pneumonia Father 56   She indicated that her mother is deceased. She indicated that her father is deceased.  Social History    Social History   Socioeconomic History   Marital status: Widowed    Spouse name: Ronni Rumble   Number of children: 2   Years of education: Not on file   Highest education level: Not on file  Occupational History   Occupation: retired  Tobacco Use   Smoking status: Never    Passive exposure: Yes   Smokeless tobacco: Never  Vaping Use   Vaping status: Never Used  Substance and Sexual Activity   Alcohol use: Yes    Alcohol/week: 3.0 standard drinks of alcohol    Types: 3 Glasses of wine per week    Comment: rare   Drug use: No    Sexual activity: Not on file  Other Topics Concern   Not on file  Social History Narrative   Patient is right-handed. She lives with her husband in a one level home. She does not exercise.   Social Drivers of Health   Financial Resource Strain: Medium Risk (08/15/2021)   [facility] prior to 12/13/2022., [facility] visits prior to 12/13/2022.   Overall Financial Resource Strain (CARDIA)    Difficulty of Paying Living Expenses: Somewhat hard  Food Insecurity: Patient Declined (12/03/2023)   Hunger Vital Sign    Worried About Running Out of Food in the Last Year: Patient declined    Ran Out of Food in the Last Year: Patient declined  Transportation Needs: Patient Declined (12/03/2023)   PRAPARE - Administrator, Civil Service (Medical): Patient declined    Lack of Transportation (Non-Medical): Patient declined  Physical Activity: Insufficiently Active (08/15/2021)   Received from [facility] visits prior to 12/13/2022., [facility] visits prior to 12/13/2022.   Exercise Vital Sign    Days of Exercise per Week: 2 days    Minutes of Exercise per Session: 20 min  Stress: No Stress Concern Present (08/15/2021)   [facility] prior to 12/13/2022., [facility] visits prior to 12/13/2022.   [employer] of Occupational Health - Occupational Stress Questionnaire    Feeling of Stress : Not at all  Social Connections: Patient Declined (12/03/2023)   Social Connection and Isolation Panel [NHANES]    Frequency of Communication with Friends and Family: Patient declined    Frequency of Social Gatherings with Friends and Family: Patient declined    Attends Religious Services: Patient declined    Database administrator or Organizations: Patient declined    Attends Banker Meetings: Patient declined    Marital  Status: Patient declined  Intimate Partner Violence: Not At Risk (12/03/2023)   Humiliation, Afraid, Rape, and Kick questionnaire    Fear of Current or Ex-Partner: No    Emotionally Abused: No    Physically Abused: No    Sexually Abused: No     Review of Systems    General:  No chills, fever, night sweats  or weight changes.  Cardiovascular:  No chest pain, dyspnea on exertion, edema, orthopnea, palpitations, paroxysmal nocturnal dyspnea. Dermatological: No rash, lesions/masses Respiratory: No cough, dyspnea Urologic: No hematuria, dysuria Abdominal:   No nausea, vomiting, diarrhea, bright red blood per rectum, melena, or hematemesis Neurologic:  No visual changes, wkns, changes in mental status. All other systems reviewed and are otherwise negative except as noted above.  Physical Exam    VS:  There were no vitals taken for this visit. , BMI There is no height or weight on file to calculate BMI. GEN: Well nourished, well developed, in no acute distress. HEENT: normal. Neck: Supple, no JVD, carotid bruits, or masses. Cardiac: RRR, no murmurs, rubs, or gallops. No clubbing, cyanosis, edema.  Radials/DP/PT 2+ and equal bilaterally.  Respiratory:  Respirations regular and unlabored, clear to auscultation bilaterally. GI: Soft, nontender, nondistended, BS + x 4. MS: no deformity or atrophy. Skin: warm and dry, no rash. Neuro:  Strength and sensation are intact. Psych: Normal affect.  Accessory Clinical Findings    Recent Labs: 11/30/2023: ALT 19; B Natriuretic Peptide 237.4; Hemoglobin 11.2; Platelets 275; TSH 2.773 12/03/2023: BUN 13; Creatinine, Ser 1.02; Magnesium 1.9; Potassium 4.0; Sodium 133   Recent Lipid Panel    Component Value Date/Time   CHOL 134 11/14/2015 1004   TRIG 24.0 11/14/2015 1004   HDL 78.70 11/14/2015 1004   CHOLHDL 2 11/14/2015 1004   VLDL 4.8 11/14/2015 1004   LDLCALC 51 11/14/2015 1004    No BP recorded.  {Refresh Note OR Click here to enter BP   :1}***    ECG personally reviewed by me today- ***    Echocardiogram 12/01/2023  IMPRESSIONS     1. Left ventricular ejection fraction, by estimation, is 55 to 60%. The  left ventricle has normal function. The left ventricle has no regional  wall motion abnormalities. There is mild concentric left ventricular  hypertrophy. Left ventricular diastolic  parameters are indeterminate.   2. Right ventricular systolic function is normal. The right ventricular  size is severely enlarged. Tricuspid regurgitation signal is inadequate  for assessing PA pressure.   3. Left atrial size was mild to moderately dilated.   4. The mitral valve is grossly normal. Mild mitral valve regurgitation.  No evidence of mitral stenosis.   5. The aortic valve is tricuspid. Aortic valve regurgitation is not  visualized. No aortic stenosis is present.   6. Aortic dilatation noted. There is mild dilatation of the ascending  aorta, measuring 40 mm.   7. The inferior vena cava is dilated in size with >50% respiratory  variability, suggesting right atrial pressure of 8 mmHg.   Comparison(s): Aortic dimensions increased from prior.   FINDINGS   Left Ventricle: Left ventricular ejection fraction, by estimation, is 55  to 60%. The left ventricle has normal function. The left ventricle has no  regional wall motion abnormalities. Strain imaging was not performed. The  left ventricular internal cavity   size was normal in size. There is mild concentric left ventricular  hypertrophy. Left ventricular diastolic parameters are indeterminate.   Right Ventricle: The right ventricular size is severely enlarged. No  increase in right ventricular wall thickness. Right ventricular systolic  function is normal. Tricuspid regurgitation signal is inadequate for  assessing PA pressure.   Left Atrium: Left atrial size was mild to moderately dilated.   Right Atrium: Right atrial size was normal in size.   Pericardium: There  is no evidence of pericardial effusion.   Mitral  Valve: The mitral valve is grossly normal. Mild mitral valve  regurgitation. No evidence of mitral valve stenosis.   Tricuspid Valve: The tricuspid valve is normal in structure. Tricuspid  valve regurgitation is not demonstrated.   Aortic Valve: The aortic valve is tricuspid. Aortic valve regurgitation is  not visualized. No aortic stenosis is present.   Pulmonic Valve: The pulmonic valve was normal in structure. Pulmonic valve  regurgitation is mild. No evidence of pulmonic stenosis.   Aorta: Aortic dilatation noted. There is mild dilatation of the ascending  aorta, measuring 40 mm.   Venous: The inferior vena cava is dilated in size with greater than 50%  respiratory variability, suggesting right atrial pressure of 8 mmHg.   IAS/Shunts: The atrial septum is grossly normal.       Assessment & Plan   1.  ***   Thomasene Ripple. Kaelynn Igo NP-C     12/07/2023, 9:53 AM [facility] [address] Office [phone] Fax [phone]    I spent***minutes examining this patient, reviewing medications, and using patient centered shared decision making involving their cardiac care.   I spent  20 minutes reviewing past medical history,  medications, and prior cardiac tests.

## 2023-12-08 ENCOUNTER — Encounter (HOSPITAL_BASED_OUTPATIENT_CLINIC_OR_DEPARTMENT_OTHER): Payer: Self-pay | Admitting: Emergency Medicine

## 2023-12-08 ENCOUNTER — Other Ambulatory Visit: Payer: Self-pay

## 2023-12-08 ENCOUNTER — Observation Stay (HOSPITAL_BASED_OUTPATIENT_CLINIC_OR_DEPARTMENT_OTHER)
Admission: EM | Admit: 2023-12-08 | Discharge: 2023-12-09 | Disposition: A | Discharge status: Home / Self Care | Payer: Medicare HMO | Attending: Internal Medicine | Admitting: Internal Medicine

## 2023-12-08 ENCOUNTER — Telehealth: Payer: Self-pay | Admitting: Cardiology

## 2023-12-08 DIAGNOSIS — Z8673 Personal history of transient ischemic attack (TIA), and cerebral infarction without residual deficits: Secondary | ICD-10-CM | POA: Insufficient documentation

## 2023-12-08 DIAGNOSIS — I5033 Acute on chronic diastolic (congestive) heart failure: Secondary | ICD-10-CM | POA: Insufficient documentation

## 2023-12-08 DIAGNOSIS — Z7901 Long term (current) use of anticoagulants: Secondary | ICD-10-CM | POA: Insufficient documentation

## 2023-12-08 DIAGNOSIS — N184 Chronic kidney disease, stage 4 (severe): Secondary | ICD-10-CM | POA: Diagnosis not present

## 2023-12-08 DIAGNOSIS — Z79899 Other long term (current) drug therapy: Secondary | ICD-10-CM | POA: Insufficient documentation

## 2023-12-08 DIAGNOSIS — Z1152 Encounter for screening for COVID-19: Secondary | ICD-10-CM | POA: Diagnosis not present

## 2023-12-08 DIAGNOSIS — N1831 Chronic kidney disease, stage 3a: Secondary | ICD-10-CM | POA: Insufficient documentation

## 2023-12-08 DIAGNOSIS — I130 Hypertensive heart and chronic kidney disease with heart failure and stage 1 through stage 4 chronic kidney disease, or unspecified chronic kidney disease: Secondary | ICD-10-CM | POA: Insufficient documentation

## 2023-12-08 DIAGNOSIS — I480 Paroxysmal atrial fibrillation: Secondary | ICD-10-CM | POA: Diagnosis not present

## 2023-12-08 DIAGNOSIS — J45909 Unspecified asthma, uncomplicated: Secondary | ICD-10-CM | POA: Diagnosis not present

## 2023-12-08 DIAGNOSIS — N179 Acute kidney failure, unspecified: Principal | ICD-10-CM | POA: Diagnosis present

## 2023-12-08 DIAGNOSIS — E875 Hyperkalemia: Principal | ICD-10-CM | POA: Diagnosis present

## 2023-12-08 DIAGNOSIS — R7989 Other specified abnormal findings of blood chemistry: Secondary | ICD-10-CM | POA: Diagnosis present

## 2023-12-08 LAB — URINALYSIS, ROUTINE W REFLEX MICROSCOPIC
Bilirubin Urine: NEGATIVE
Glucose, UA: 250 mg/dL — AB
Hgb urine dipstick: NEGATIVE
Ketones, ur: NEGATIVE mg/dL
Leukocytes,Ua: NEGATIVE
Nitrite: NEGATIVE
Protein, ur: NEGATIVE mg/dL
Specific Gravity, Urine: 1.016 (ref 1.005–1.030)
pH: 6 (ref 5.0–8.0)

## 2023-12-08 LAB — CBC
HCT: 38 % (ref 36.0–46.0)
Hemoglobin: 12.6 g/dL (ref 12.0–15.0)
MCH: 28.5 pg (ref 26.0–34.0)
MCHC: 33.2 g/dL (ref 30.0–36.0)
MCV: 86 fL (ref 80.0–100.0)
Platelets: 377 10*3/uL (ref 150–400)
RBC: 4.42 MIL/uL (ref 3.87–5.11)
RDW: 14 % (ref 11.5–15.5)
WBC: 6.5 10*3/uL (ref 4.0–10.5)
nRBC: 0 % (ref 0.0–0.2)

## 2023-12-08 LAB — RESP PANEL BY RT-PCR (RSV, FLU A&B, COVID)  RVPGX2
Influenza A by PCR: NEGATIVE
Influenza B by PCR: NEGATIVE
Resp Syncytial Virus by PCR: NEGATIVE
SARS Coronavirus 2 by RT PCR: NEGATIVE

## 2023-12-08 LAB — BASIC METABOLIC PANEL
Anion gap: 10 (ref 5–15)
BUN/Creatinine Ratio: 19 (ref 12–28)
BUN: 39 mg/dL — ABNORMAL HIGH (ref 8–27)
BUN: 39 mg/dL — ABNORMAL HIGH (ref 8–23)
CO2: 23 mmol/L (ref 20–29)
CO2: 25 mmol/L (ref 22–32)
Calcium: 9.7 mg/dL (ref 8.7–10.3)
Calcium: 9.6 mg/dL (ref 8.9–10.3)
Chloride: 91 mmol/L — ABNORMAL LOW (ref 96–106)
Chloride: 93 mmol/L — ABNORMAL LOW (ref 98–111)
Creatinine, Ser: 2.04 mg/dL — ABNORMAL HIGH (ref 0.57–1.00)
Creatinine, Ser: 1.79 mg/dL — ABNORMAL HIGH (ref 0.44–1.00)
GFR, Estimated: 28 mL/min — ABNORMAL LOW (ref >60)
Glucose, Bld: 112 mg/dL — ABNORMAL HIGH (ref 70–99)
Glucose: 88 mg/dL (ref 70–99)
Potassium: 6.3 mmol/L (ref 3.5–5.2)
Potassium: 5.1 mmol/L (ref 3.5–5.1)
Sodium: 131 mmol/L — ABNORMAL LOW (ref 134–144)
Sodium: 128 mmol/L — ABNORMAL LOW (ref 135–145)
eGFR: 24 mL/min/{1.73_m2} — ABNORMAL LOW (ref >59)

## 2023-12-08 LAB — HEPATIC FUNCTION PANEL
ALT: 19 U/L (ref 0–44)
AST: 23 U/L (ref 15–41)
Albumin: 4.5 g/dL (ref 3.5–5.0)
Alkaline Phosphatase: 61 U/L (ref 38–126)
Bilirubin, Direct: 0.2 mg/dL (ref 0.0–0.2)
Indirect Bilirubin: 0.3 mg/dL (ref 0.3–0.9)
Total Bilirubin: 0.5 mg/dL (ref 0.0–1.2)
Total Protein: 7.6 g/dL (ref 6.5–8.1)

## 2023-12-08 LAB — TROPONIN I (HIGH SENSITIVITY): Troponin I (High Sensitivity): 6 ng/L (ref <18)

## 2023-12-08 LAB — MAGNESIUM: Magnesium: 2.4 mg/dL (ref 1.7–2.4)

## 2023-12-08 MED ORDER — ACETAMINOPHEN 325 MG PO TABS
650.0000 mg | ORAL_TABLET | Freq: Four times a day (QID) | ORAL | Status: DC | PRN
  Administered 2023-12-08: 650 mg via ORAL
  Filled 2023-12-08: qty 2

## 2023-12-08 MED ORDER — MELATONIN 5 MG PO TABS
5.0000 mg | ORAL_TABLET | Freq: Every evening | ORAL | Status: DC | PRN
  Administered 2023-12-08: 5 mg via ORAL
  Filled 2023-12-08: qty 1

## 2023-12-08 MED ORDER — APIXABAN 5 MG PO TABS
5.0000 mg | ORAL_TABLET | Freq: Two times a day (BID) | ORAL | Status: DC
  Administered 2023-12-08 – 2023-12-09 (×2): 5 mg via ORAL
  Filled 2023-12-08 (×2): qty 1

## 2023-12-08 MED ORDER — DILTIAZEM HCL ER COATED BEADS 120 MG PO CP24
120.0000 mg | ORAL_CAPSULE | Freq: Every day | ORAL | Status: DC
  Administered 2023-12-08 – 2023-12-09 (×2): 120 mg via ORAL
  Filled 2023-12-08 (×3): qty 1

## 2023-12-08 MED ORDER — SODIUM CHLORIDE 0.9 % IV SOLN
INTRAVENOUS | Status: DC
Start: 2023-12-08 — End: 2023-12-09

## 2023-12-08 MED ORDER — MONTELUKAST SODIUM 10 MG PO TABS
10.0000 mg | ORAL_TABLET | Freq: Every day | ORAL | Status: DC
Start: 2023-12-08 — End: 2023-12-09
  Administered 2023-12-08: 10 mg via ORAL
  Filled 2023-12-08: qty 1

## 2023-12-08 MED ORDER — LACTATED RINGERS IV BOLUS
500.0000 mL | Freq: Once | INTRAVENOUS | Status: DC
Start: 2023-12-08 — End: 2023-12-08

## 2023-12-08 MED ORDER — FUROSEMIDE 10 MG/ML IJ SOLN: 20.0000 mg | Freq: Once | INTRAMUSCULAR | Status: DC

## 2023-12-08 MED ORDER — AMIODARONE HCL 200 MG PO TABS
100.0000 mg | ORAL_TABLET | Freq: Two times a day (BID) | ORAL | Status: DC
  Administered 2023-12-08 – 2023-12-09 (×2): 100 mg via ORAL
  Filled 2023-12-08 (×2): qty 1

## 2023-12-08 MED ORDER — FLUTICASONE FUROATE-VILANTEROL 200-25 MCG/ACT IN AEPB: 1.0000 | INHALATION_SPRAY | Freq: Every day | RESPIRATORY_TRACT | Status: DC

## 2023-12-08 MED ORDER — ORAL CARE MOUTH RINSE: 15.0000 mL | OROMUCOSAL | Status: DC | PRN

## 2023-12-08 MED ORDER — METOPROLOL SUCCINATE ER 50 MG PO TB24
100.0000 mg | ORAL_TABLET | Freq: Every day | ORAL | Status: DC
  Administered 2023-12-08 – 2023-12-09 (×2): 100 mg via ORAL
  Filled 2023-12-08 (×2): qty 2

## 2023-12-08 MED ORDER — ACETAMINOPHEN 650 MG RE SUPP: 650.0000 mg | Freq: Four times a day (QID) | RECTAL | Status: DC | PRN

## 2023-12-08 MED ORDER — SODIUM CHLORIDE 0.9% FLUSH
3.0000 mL | Freq: Two times a day (BID) | INTRAVENOUS | Status: DC
  Administered 2023-12-08 – 2023-12-09 (×2): 3 mL via INTRAVENOUS

## 2023-12-08 MED ORDER — SODIUM CHLORIDE 0.9 % IV BOLUS
500.0000 mL | Freq: Once | INTRAVENOUS | Status: AC
  Administered 2023-12-08: 500 mL via INTRAVENOUS

## 2023-12-08 MED ORDER — FLUTICASONE FUROATE-VILANTEROL 200-25 MCG/ACT IN AEPB
1.0000 | INHALATION_SPRAY | Freq: Every day | RESPIRATORY_TRACT | Status: DC
  Administered 2023-12-09: 1 via RESPIRATORY_TRACT
  Filled 2023-12-08: qty 28

## 2023-12-08 NOTE — Progress Notes (Signed)
 Pt. Has cpap from home. RT assisted pt. With placing sterile water into the chamber.

## 2023-12-08 NOTE — Telephone Encounter (Signed)
 Outpatient service line: Hyperkalemia, 6.3  Yesterday labs 2/24 show potassium of 6.3.  Recently admitted for HFpEF exacerbation started on spironolactone and Jardiance.  Discharge summary also shows that their potassium supplementation was also continued.  Labs also indicate AKI with a creatinine of 2.0 previously normal.  Suspect that they are dehydrated.  I recommended patient go to nearest emergency room, bring list of medications and instructed not to take any of her medications this morning until otherwise directed.  Patient in agreement.   Once at the ED need to get EKG, may need Kayexalate, repeat labs.

## 2023-12-08 NOTE — ED Provider Notes (Signed)
 [place] EMERGENCY DEPARTMENT AT [facility] Provider Note   CSN: [id number] Arrival date & time: 12/08/23  7846     History  Chief Complaint  Patient presents with   Abnormal Lab    Alisha Terrell is a 83 y.o. female.   Abnormal Lab   83 year old female presents emergency department with complaints of abnormal labs.  Patient reports recently being hospitalized for heart failure exacerbation.  States that she was in the hospital for four days being diuresed.  Had outpatient follow-up with cardiology with laboratory studies performed yesterday and was called due to concern for elevated creatinine as well as elevated potassium.  States she came to the emergency department due to the phone call.  States she has felt much better from a breathing perspective ever since her discharge from the hospital.  States that she has felt generally weak from laying in the hospital bed.  States that her p.o. intake has been somewhat decreased due to not having appetite.  Denies any fevers, chills, chest pain, abdominal pain, urinary symptoms, change in bowel habits.  Does report that they recently started her on furosemide, spironolactone, Marcelline Deist  Past medical history significant for TIA, CHF, class II obesity, GERD, OSA, paroxysmal atrial fibrillation on Eliquis  Home Medications Prior to Admission medications   Medication Sig Start Date End Date Taking? Authorizing Provider  acetaminophen (TYLENOL) 500 MG tablet Take 500 mg by mouth 2 (two) times daily. Two Tablets Every 6 hours as Needed For Pain   Yes [provider]  ADVAIR DISKUS 250-50 MCG/DOSE AEPB Inhale 1 puff into the lungs 2 (two) times daily.  08/22/15  Yes [provider]  amiodarone (PACERONE) 200 MG tablet Take 1 tablet (200 mg total) by mouth daily. 05/18/23  Yes Parke Poisson, MD  apixaban (ELIQUIS) 5 MG TABS tablet Take 1 tablet (5 mg total) by mouth 2 (two) times daily. 08/07/23  Yes Jodelle Gross, NP  Azelastine HCl 137 MCG/SPRAY SOLN Place 1 spray into both nostrils daily. 03/12/22  Yes [provider]  calcium carbonate (TUMS - DOSED IN MG ELEMENTAL CALCIUM) 500 MG chewable tablet Chew 1 tablet by mouth daily as needed for indigestion or heartburn.   Yes [provider]  dapagliflozin propanediol (FARXIGA) 10 MG TABS tablet Take 1 tablet (10 mg total) by mouth daily. 12/04/23  Yes Marjie Skiff E, PA-C  Dextromethorphan-Guaifenesin (ROBITUSSIN COUGH/CHEST DM MAX PO) Take 1 mL by mouth 2 (two) times daily as needed (for cold symptoms).    Yes [provider]  diclofenac Sodium (VOLTAREN) 1 % GEL Apply 2 g topically at bedtime. 08/16/19  Yes [provider]  diltiazem (CARDIZEM CD) 300 MG 24 hr capsule Take 1 capsule by mouth daily. 09/14/23  Yes [provider]  fluticasone (FLONASE) 50 MCG/ACT nasal spray Place 1 spray into both nostrils every morning. 09/26/15  Yes [provider]  ibandronate (BONIVA) 150 MG tablet Take 150 mg by mouth every 30 (thirty) days.  08/16/19  Yes [provider]  lidocaine (LMX) 4 % cream Apply 1 Application topically as needed (back pain).   Yes [provider]  losartan (COZAAR) 100 MG tablet TAKE 1 TABLET BY MOUTH DAILY 01/05/23  Yes Weston Brass A, MD  melatonin 5 MG TABS Take 5 mg by mouth at bedtime.   Yes [provider]  metoprolol succinate (TOPROL-XL) 100 MG 24 hr tablet Take 1 tablet (100 mg total) by mouth daily. Take with or  immediately following a meal. 12/04/23  Yes Goodrich, Callie E, PA-C  montelukast (SINGULAIR) 10 MG tablet Take 10 mg by mouth at bedtime. 09/10/15  Yes [provider]  Multiple Vitamins-Minerals (CENTRUM SILVER ADULT 50+ PO) Take 1 tablet by mouth in the morning.   Yes [provider]  omeprazole (PRILOSEC) 40 MG capsule Take 40 mg by mouth in the morning and at bedtime.  09/10/15  Yes [provider]   potassium chloride SA (KLOR-CON M) 20 MEQ tablet Take 1 tablet (20 mEq total) by mouth daily. 12/04/23  Yes Corrin Parker, PA-C  PROAIR HFA 108 [phone] Base) MCG/ACT inhaler Inhale 1-2 puffs into the lungs at bedtime. 10/05/15  Yes [provider]  spironolactone (ALDACTONE) 25 MG tablet Take 1 tablet (25 mg total) by mouth daily. 12/04/23  Yes Marjie Skiff E, PA-C  torsemide (DEMADEX) 20 MG tablet Take 1 tablet (20 mg total) by mouth daily. 12/04/23  Yes Marjie Skiff E, PA-C  traMADol (ULTRAM) 50 MG tablet Take 50 mg by mouth every 6 (six) hours as needed. 03/23/19  Yes [provider]      Allergies    Codeine and Omnicef [cefdinir]    Review of Systems   Review of Systems  All other systems reviewed and are negative.   Physical Exam Updated Vital Signs BP (!) 153/83 (BP Location: Right Arm)   Pulse 77   Temp 98.7 F (37.1 C) (Oral)   Resp 18   Ht 4' 8.5" (1.435 m)   Wt 76.3 kg   SpO2 98%   BMI 37.05 kg/m  Physical Exam Vitals and nursing note reviewed.  Constitutional:      General: She is not in acute distress.    Appearance: She is well-developed.  HENT:     Head: Normocephalic and atraumatic.  Eyes:     Conjunctiva/sclera: Conjunctivae normal.  Cardiovascular:     Rate and Rhythm: Normal rate and regular rhythm.  Pulmonary:     Effort: Pulmonary effort is normal. No respiratory distress.     Breath sounds: Normal breath sounds. No wheezing, rhonchi or rales.  Abdominal:     Palpations: Abdomen is soft.     Tenderness: There is no abdominal tenderness.  Musculoskeletal:        General: No swelling.     Cervical back: Neck supple.     Right lower leg: No edema.     Left lower leg: No edema.  Skin:    General: Skin is warm and dry.     Capillary Refill: Capillary refill takes less than 2 seconds.  Neurological:     Mental Status: She is alert.  Psychiatric:        Mood and Affect: Mood normal.     ED Results / Procedures /  Treatments   Labs (all labs ordered are listed, but only abnormal results are displayed) Labs Reviewed  BASIC METABOLIC PANEL - Abnormal; Notable for the following components:      Result Value   Sodium 128 (*)    Chloride 93 (*)    Glucose, Bld 112 (*)    BUN 39 (*)    Creatinine, Ser 1.79 (*)    GFR, Estimated 28 (*)    All other components within normal limits  URINALYSIS, ROUTINE W REFLEX MICROSCOPIC - Abnormal; Notable for the following components:   Glucose, UA 250 (*)    All other components within normal limits  RESP PANEL BY RT-PCR (RSV, FLU A&B, COVID)  [id number]  CBC  MAGNESIUM  HEPATIC FUNCTION PANEL  COMPREHENSIVE METABOLIC PANEL  CBC  MAGNESIUM  TROPONIN I (HIGH SENSITIVITY)    EKG None  Radiology No results found.  Procedures Procedures    Medications Ordered in ED Medications  sodium chloride flush (NS) 0.9 % injection 3 mL (has no administration in time range)  0.9 %  sodium chloride infusion ( Intravenous New Bag/Given 12/08/23 1723)  acetaminophen (TYLENOL) tablet 650 mg (has no administration in time range)    Or  acetaminophen (TYLENOL) suppository 650 mg (has no administration in time range)  amiodarone (PACERONE) tablet 100 mg (has no administration in time range)  apixaban (ELIQUIS) tablet 5 mg (has no administration in time range)  diltiazem (CARDIZEM CD) 24 hr capsule 120 mg (has no administration in time range)  metoprolol succinate (TOPROL-XL) 24 hr tablet 100 mg (100 mg Oral Given 12/08/23 1723)  montelukast (SINGULAIR) tablet 10 mg (has no administration in time range)  fluticasone furoate-vilanterol (BREO ELLIPTA) 200-25 MCG/ACT 1 puff (1 puff Inhalation Not Given 12/08/23 1655)  sodium chloride 0.9 % bolus 500 mL (500 mLs Intravenous New Bag/Given 12/08/23 1325)    ED Course/ Medical Decision Making/ A&P Clinical Course as of 12/08/23 1805  Tue Dec 08, 2023  1248 Consulted Dr. Allena Katz who agreed admission and assume further treatment/care.  [CR]    Clinical Course User Index [CR] Peter Garter, PA                                 Medical Decision Making Amount and/or Complexity of Data Reviewed Labs: ordered.  Risk Decision regarding hospitalization.   This patient presents to the ED for concern of abnormal lab, this involves an extensive number of treatment options, and is a complaint that carries with it a high risk of complications and morbidity.  The differential diagnosis includes laboratory error, AKI, hyperkalemia, other   Co morbidities that complicate the patient evaluation  See HPI   Additional history obtained:  Additional history obtained from EMR External records from outside source obtained and reviewed including hospital records   Lab Tests:  I Ordered, and personally interpreted labs.  The pertinent results include: No leukocytosis.  No evidence of anemia.  Placed within range.  Patient with hyponatremia, hypochloremia of 120 and 93 respectively.  Patient with evidence of AKI elevated creatinine 1.79, BUN of 39 GFR 28.   Imaging Studies ordered:  N/a   Cardiac Monitoring: / EKG:  The patient was maintained on a cardiac monitor.  I personally viewed and interpreted the cardiac monitored which showed an underlying rhythm of: Sinus rhythm first-degree AV block.  Right bundle branch block.  Consultations Obtained:  See ED course  Problem List / ED Course / Critical interventions / Medication management  AKI Reevaluation of the patient showed that the patient stayed the same I have reviewed the patients home medicines and have made adjustments as needed   Social Determinants of Health:  Denies tobacco, illicit drug use.   Test / Admission - Considered:  AKI Vitals signs significant for hypertension blood pressure 146/69. Otherwise within normal range and stable throughout visit. Laboratory/imaging studies significant for: See above 83 year old female presents emergency  department complaints of abnormal lab.  Was seen by cardiology after recent hospital admission for diuresis regarding CHF exacerbation.  Patient reportedly had elevated potassium as well as elevated creatinine and was sent to the emergency department for  further assessment.  Patient states that she has felt generally weak ever since hospitalization but seems to be regaining her strength.  Otherwise, has felt at baseline.  Laboratory studies repeated while in the ED showed normal potassium but did still show elevated creatinine despite improvement from yesterday's laboratory studies.  Still near doubling of patient's baseline creatinine.  Suspect patient's AKI could be secondary to diuresis is painful in the hospital and and also being placed on furosemide/spironolactone for continued diuresis once discharged.  Will admit to hospitalist for further evaluation of patient's AKI.  Consulted hospitalist who agreed with admission.  Treatment plan discussed at length with patient and she acknowledged understanding was agreeable         Final Clinical Impression(s) / ED Diagnoses Final diagnoses:  AKI (acute kidney injury) [facility])    Rx / DC Orders ED Discharge Orders     None         Peter Garter, [place] 12/08/23 1805    Margarita Grizzle, MD 12/19/23 1054

## 2023-12-08 NOTE — H&P (Signed)
 History and Physical    Patient: Alisha Terrell DOB: 1941/04/10 DOA: 12/08/2023 DOS: the patient was seen and examined on 12/08/2023 PCP: Mattie Marlin, DO  Patient coming from:  [facility] Chief complaint: Chief Complaint  Patient presents with   Abnormal Lab   HPI:  Alisha Terrell is a 83 y.o. female with past medical history  of allergies to codeine and Omnicef, acute on chronic diastolic congestive heart failure, paroxysmal atrial fibrillation on Eliquis amiodarone metoprolol., essential hypertension, CKD stage IIIa, history of TIA, OSA on CPAP, obesity, Comes to [place] today from cardiology office being referred to [facility] for abnormal labs.  Per provider patient kidney function is worse than before although better than yesterday.  Patient was clinically asymptomatic.  No reports of dizziness syncope passing out bleeding falling chest pain palpitation shortness of breath nausea vomiting diarrhea or any other complaints.  No history of tobacco abuse, patient drinks about 3 glasses of wine per week.  Chart review shows patient's mostly been admitted by cardiology services last was admitted by hospitalist in 2022.  Also patient's most recent echocardiogram was on 18 February this year showing EF of 55 to 60%.  >>ED Course: In emergency room patient has been alert and stable with O2 sats of 100% on room air, blood pressure has been risings slightly.  Patient has not taken her a.m. meds today. Vitals:   12/08/23 1430 12/08/23 1445 12/08/23 1506 12/08/23 1616  BP: (!) 144/79 (!) 161/91  (!) 153/83  Pulse: 74 79  77  Temp:   98.7 F (37.1 C)   Resp: 20 19  18   Height:    4' 8.5" (1.435 m)  Weight:    76.3 kg  SpO2: 99% 100%  98%  TempSrc:   Oral   BMI (Calculated):    37.05  ED evaluation  so far shows: CMP shows sodium 128 chloride 93 glucose 112 creatinine 1.79 EGFR 28 normal LFTs. Troponin of 6. CBC within normal limits. Respiratory panel negative for flu RSV and  COVID. Urinalysis within normal limits. Admission requested for abnormal labs EKG sinus rhythm 64 with a first-degree AV block with a PR of 322 QRS of 158 QTc 494 and a right bundle branch block. EKG is unchanged from October 2024 with same T wave inversions seen in leads III, aVF and V1.  In the emergency room  pt has received the following treatment thus far: Medications  sodium chloride flush (NS) 0.9 % injection 3 mL (has no administration in time range)  0.9 %  sodium chloride infusion ( Intravenous New Bag/Given 12/08/23 1723)  acetaminophen (TYLENOL) tablet 650 mg (has no administration in time range)    Or  acetaminophen (TYLENOL) suppository 650 mg (has no administration in time range)  amiodarone (PACERONE) tablet 100 mg (has no administration in time range)  apixaban (ELIQUIS) tablet 5 mg (has no administration in time range)  diltiazem (CARDIZEM CD) 24 hr capsule 120 mg (has no administration in time range)  metoprolol succinate (TOPROL-XL) 24 hr tablet 100 mg (100 mg Oral Given 12/08/23 1723)  montelukast (SINGULAIR) tablet 10 mg (has no administration in time range)  fluticasone furoate-vilanterol (BREO ELLIPTA) 200-25 MCG/ACT 1 puff (1 puff Inhalation Not Given 12/08/23 1655)  sodium chloride 0.9 % bolus 500 mL (500 mLs Intravenous New Bag/Given 12/08/23 1325)   Review of Systems  All other systems reviewed and are negative.  Past Medical History:  Diagnosis Date   ARF (acute renal failure) (HCC)  2020  resolved  it was presrnt when she was septic   Arthritis    Asthma    CHF (congestive heart failure) (HCC)    Chronic back pain    Class 2 obesity due to excess calories with body mass index (BMI) of 35.0 to 35.9 in adult    Dyspnea    ocassional   GERD (gastroesophageal reflux disease)    Headache    Hypertension    OSA (obstructive sleep apnea)    PAF (paroxysmal atrial fibrillation) (HCC)    TIA (transient ischemic attack) 2018   no resedule   Past Surgical  History:  Procedure Laterality Date   ABDOMINAL SURGERY     CESAREAN SECTION     CHOLECYSTECTOMY     LESION EXCISION WITH COMPLEX REPAIR Left 07/25/2022   Procedure: LESION EXCISION OF LEFT BUCCAL MUCOSA;  Surgeon: Laren Boom, DO;  Location: [facility] OR;  Service: ENT;  Laterality: Left;    reports that she has never smoked. She has been exposed to tobacco smoke. She has never used smokeless tobacco. She reports current alcohol use of about 3.0 standard drinks of alcohol per week. She reports that she does not use drugs.  Allergies  Allergen Reactions   Codeine Rash and Other (See Comments)    Agitation, bad dreams   Omnicef [Cefdinir] Nausea And Vomiting    Confirmed with patient at bedside that no rash - just had significant GI effects   Family History  Problem Relation Age of Onset   Hypertension Mother 61   Pneumonia Father 30   Prior to Admission medications   Medication Sig Start Date End Date Taking? Authorizing Provider  acetaminophen (TYLENOL) 500 MG tablet Take 500 mg by mouth 2 (two) times daily. Two Tablets Every 6 hours as Needed For Pain   Yes [provider]  ADVAIR DISKUS 250-50 MCG/DOSE AEPB Inhale 1 puff into the lungs 2 (two) times daily.  08/22/15  Yes [provider]  amiodarone (PACERONE) 200 MG tablet Take 1 tablet (200 mg total) by mouth daily. 05/18/23  Yes Parke Poisson, MD  apixaban (ELIQUIS) 5 MG TABS tablet Take 1 tablet (5 mg total) by mouth 2 (two) times daily. 08/07/23  Yes Jodelle Gross, NP  Azelastine HCl 137 MCG/SPRAY SOLN Place 1 spray into both nostrils daily. 03/12/22  Yes [provider]  calcium carbonate (TUMS - DOSED IN MG ELEMENTAL CALCIUM) 500 MG chewable tablet Chew 1 tablet by mouth daily as needed for indigestion or heartburn.   Yes [provider]  dapagliflozin propanediol (FARXIGA) 10 MG TABS tablet Take 1 tablet (10 mg total) by mouth daily. 12/04/23  Yes Marjie Skiff E, PA-C   Dextromethorphan-Guaifenesin (ROBITUSSIN COUGH/CHEST DM MAX PO) Take 1 mL by mouth 2 (two) times daily as needed (for cold symptoms).    Yes [provider]  diclofenac Sodium (VOLTAREN) 1 % GEL Apply 2 g topically at bedtime. 08/16/19  Yes [provider]  diltiazem (CARDIZEM CD) 300 MG 24 hr capsule Take 1 capsule by mouth daily. 09/14/23  Yes [provider]  fluticasone (FLONASE) 50 MCG/ACT nasal spray Place 1 spray into both nostrils every morning. 09/26/15  Yes [provider]  ibandronate (BONIVA) 150 MG tablet Take 150 mg by mouth every 30 (thirty) days.  08/16/19  Yes [provider]  lidocaine (LMX) 4 % cream Apply 1 Application topically as needed (back pain).   Yes [provider]  losartan (COZAAR) 100 MG  tablet TAKE 1 TABLET BY MOUTH DAILY 01/05/23  Yes Weston Brass A, MD  melatonin 5 MG TABS Take 5 mg by mouth at bedtime.   Yes [provider]  metoprolol succinate (TOPROL-XL) 100 MG 24 hr tablet Take 1 tablet (100 mg total) by mouth daily. Take with or immediately following a meal. 12/04/23  Yes Goodrich, Callie E, PA-C  montelukast (SINGULAIR) 10 MG tablet Take 10 mg by mouth at bedtime. 09/10/15  Yes [provider]  Multiple Vitamins-Minerals (CENTRUM SILVER ADULT 50+ PO) Take 1 tablet by mouth in the morning.   Yes [provider]  omeprazole (PRILOSEC) 40 MG capsule Take 40 mg by mouth in the morning and at bedtime.  09/10/15  Yes [provider]  potassium chloride SA (KLOR-CON M) 20 MEQ tablet Take 1 tablet (20 mEq total) by mouth daily. 12/04/23  Yes Corrin Parker, PA-C  PROAIR HFA 108 [phone] Base) MCG/ACT inhaler Inhale 1-2 puffs into the lungs at bedtime. 10/05/15  Yes [provider]  spironolactone (ALDACTONE) 25 MG tablet Take 1 tablet (25 mg total) by mouth daily. 12/04/23  Yes Marjie Skiff E, PA-C  torsemide (DEMADEX) 20 MG tablet Take 1 tablet (20 mg total) by mouth  daily. 12/04/23  Yes Marjie Skiff E, PA-C  traMADol (ULTRAM) 50 MG tablet Take 50 mg by mouth every 6 (six) hours as needed. 03/23/19  Yes [provider]     Vitals:   12/08/23 1430 12/08/23 1445 12/08/23 1506 12/08/23 1616  BP: (!) 144/79 (!) 161/91  (!) 153/83  Pulse: 74 79  77  Resp: 20 19  18   Temp:   98.7 F (37.1 C)   TempSrc:   Oral   SpO2: 99% 100%  98%  Weight:    76.3 kg  Height:    4' 8.5" (1.435 m)   Physical Exam Vitals and nursing note reviewed.  Constitutional:      General: She is not in acute distress.    Appearance: She is obese.  HENT:     Head: Normocephalic and atraumatic.     Right Ear: Hearing and external ear normal.     Left Ear: Hearing and external ear normal.     Nose: Nose normal. No nasal deformity.     Mouth/Throat:     Lips: Pink.     Tongue: No lesions.  Eyes:     General: Lids are normal.     Extraocular Movements: Extraocular movements intact.  Cardiovascular:     Rate and Rhythm: Normal rate and regular rhythm.     Heart sounds: Normal heart sounds.  Pulmonary:     Effort: Pulmonary effort is normal.     Breath sounds: Normal breath sounds.  Abdominal:     General: Bowel sounds are normal. There is no distension.     Palpations: Abdomen is soft. There is no mass.     Tenderness: There is no abdominal tenderness.  Musculoskeletal:     Right lower leg: No edema.     Left lower leg: No edema.  Skin:    General: Skin is warm.  Neurological:     General: No focal deficit present.     Mental Status: She is alert and oriented to person, place, and time.     Cranial Nerves: Cranial nerves 2-12 are intact.  Psychiatric:        Attention and Perception: Attention normal.        Mood and Affect: Mood normal.  Speech: Speech normal.        Behavior: Behavior normal. Behavior is cooperative.    Labs on Admission: I have personally reviewed following labs and imaging studies Results for orders placed or performed  during the hospital encounter of 12/08/23 (from the past 24 hours)  Basic metabolic panel     Status: Abnormal   Collection Time: 12/08/23 11:09 AM  Result Value Ref Range   Sodium 128 (L) 135 - 145 mmol/L   Potassium 5.1 3.5 - 5.1 mmol/L   Chloride 93 (L) 98 - 111 mmol/L   CO2 25 22 - 32 mmol/L   Glucose, Bld 112 (H) 70 - 99 mg/dL   BUN 39 (H) 8 - 23 mg/dL   Creatinine, Ser 8.65 (H) 0.44 - 1.00 mg/dL   Calcium 9.6 8.9 - 78.4 mg/dL   GFR, Estimated 28 (L) >60 mL/min   Anion gap 10 5 - 15  CBC     Status: None   Collection Time: 12/08/23 11:09 AM  Result Value Ref Range   WBC 6.5 4.0 - 10.5 K/uL   RBC 4.42 3.87 - 5.11 MIL/uL   Hemoglobin 12.6 12.0 - 15.0 g/dL   HCT 69.6 29.5 - 28.4 %   MCV 86.0 80.0 - 100.0 fL   MCH 28.5 26.0 - 34.0 pg   MCHC 33.2 30.0 - 36.0 g/dL   RDW 13.2 44.0 - 10.2 %   Platelets 377 150 - 400 K/uL   nRBC 0.0 0.0 - 0.2 %  Troponin I (High Sensitivity)     Status: None   Collection Time: 12/08/23 11:09 AM  Result Value Ref Range   Troponin I (High Sensitivity) 6 <18 ng/L  Magnesium     Status: None   Collection Time: 12/08/23 11:09 AM  Result Value Ref Range   Magnesium 2.4 1.7 - 2.4 mg/dL  Hepatic function panel     Status: None   Collection Time: 12/08/23 11:09 AM  Result Value Ref Range   Total Protein 7.6 6.5 - 8.1 g/dL   Albumin 4.5 3.5 - 5.0 g/dL   AST 23 15 - 41 U/L   ALT 19 0 - 44 U/L   Alkaline Phosphatase 61 38 - 126 U/L   Total Bilirubin 0.5 0.0 - 1.2 mg/dL   Bilirubin, Direct 0.2 0.0 - 0.2 mg/dL   Indirect Bilirubin 0.3 0.3 - 0.9 mg/dL  Resp panel by RT-PCR (RSV, Flu A&B, Covid) Anterior Nasal Swab     Status: None   Collection Time: 12/08/23  1:28 PM   Specimen: Anterior Nasal Swab  Result Value Ref Range   SARS Coronavirus 2 by RT PCR NEGATIVE NEGATIVE   Influenza A by PCR NEGATIVE NEGATIVE   Influenza B by PCR NEGATIVE NEGATIVE   Resp Syncytial Virus by PCR NEGATIVE NEGATIVE  Urinalysis, Routine w reflex microscopic -Urine, Clean  Catch     Status: Abnormal   Collection Time: 12/08/23  1:29 PM  Result Value Ref Range   Color, Urine YELLOW YELLOW   APPearance CLEAR CLEAR   Specific Gravity, Urine 1.016 1.005 - 1.030   pH 6.0 5.0 - 8.0   Glucose, UA 250 (A) NEGATIVE mg/dL   Hgb urine dipstick NEGATIVE NEGATIVE   Bilirubin Urine NEGATIVE NEGATIVE   Ketones, ur NEGATIVE NEGATIVE mg/dL   Protein, ur NEGATIVE NEGATIVE mg/dL   Nitrite NEGATIVE NEGATIVE   Leukocytes,Ua NEGATIVE NEGATIVE   Recent Results (from the past 720 hours)  Resp panel by RT-PCR (RSV, Flu A&B, Covid)  Anterior Nasal Swab     Status: None   Collection Time: 12/08/23  1:28 PM   Specimen: Anterior Nasal Swab  Result Value Ref Range Status   SARS Coronavirus 2 by RT PCR NEGATIVE NEGATIVE Final    Comment: (NOTE) SARS-CoV-2 target nucleic acids are NOT DETECTED.  The SARS-CoV-2 RNA is generally detectable in upper respiratory specimens during the acute phase of infection. The lowest concentration of SARS-CoV-2 viral copies this assay can detect is 138 copies/mL. A negative result does not preclude SARS-Cov-2 infection and should not be used as the sole basis for treatment or other patient management decisions. A negative result may occur with  improper specimen collection/handling, submission of specimen other than nasopharyngeal swab, presence of viral mutation(s) within the areas targeted by this assay, and inadequate number of viral copies(<138 copies/mL). A negative result must be combined with clinical observations, patient history, and epidemiological information. The expected result is Negative.  Fact Sheet for Patients:  BloggerCourse.com  Fact Sheet for Healthcare Providers:  SeriousBroker.it  This test is no t yet approved or cleared by the [place] FDA and  has been authorized for detection and/or diagnosis of SARS-CoV-2 by FDA under an Emergency Use Authorization (EUA). This  EUA will remain  in effect (meaning this test can be used) for the duration of the COVID-19 declaration under Section 564(b)(1) of the Act, 21 U.S.C.section 360bbb-3(b)(1), unless the authorization is terminated  or revoked sooner.       Influenza A by PCR NEGATIVE NEGATIVE Final   Influenza B by PCR NEGATIVE NEGATIVE Final    Comment: (NOTE) The Xpert Xpress SARS-CoV-2/FLU/RSV plus assay is intended as an aid in the diagnosis of influenza from Nasopharyngeal swab specimens and should not be used as a sole basis for treatment. Nasal washings and aspirates are unacceptable for Xpert Xpress SARS-CoV-2/FLU/RSV testing.  Fact Sheet for Patients: BloggerCourse.com  Fact Sheet for Healthcare Providers: SeriousBroker.it  This test is not yet approved or cleared by the [place] FDA and has been authorized for detection and/or diagnosis of SARS-CoV-2 by FDA under an Emergency Use Authorization (EUA). This EUA will remain in effect (meaning this test can be used) for the duration of the COVID-19 declaration under Section 564(b)(1) of the Act, 21 U.S.C. section 360bbb-3(b)(1), unless the authorization is terminated or revoked.     Resp Syncytial Virus by PCR NEGATIVE NEGATIVE Final    Comment: (NOTE) Fact Sheet for Patients: BloggerCourse.com  Fact Sheet for Healthcare Providers: SeriousBroker.it  This test is not yet approved or cleared by the [place] FDA and has been authorized for detection and/or diagnosis of SARS-CoV-2 by FDA under an Emergency Use Authorization (EUA). This EUA will remain in effect (meaning this test can be used) for the duration of the COVID-19 declaration under Section 564(b)(1) of the Act, 21 U.S.C. section 360bbb-3(b)(1), unless the authorization is terminated or revoked.  Performed at [facility], [address], [place], [place] [address]    CBC:    Latest Ref Rng & Units 12/08/2023   11:09 AM 11/30/2023    6:56 PM 07/25/2022   11:46 AM  CBC  WBC 4.0 - 10.5 K/uL 6.5  9.5  7.4   Hemoglobin 12.0 - 15.0 g/dL 96.2  95.2  84.1   Hematocrit 36.0 - 46.0 % 38.0  32.7  35.8   Platelets 150 - 400 K/uL 377  275  280    Basic Metabolic Panel: Recent Labs  Lab 12/02/23 0250 12/03/23  0207 12/07/23 1247 12/08/23 1109  NA 134* 133* 131* 128*  K 3.8 4.0 6.3* 5.1  CL 97* 100 91* 93*  CO2 25 23 23 25   GLUCOSE 107* 104* 88 112*  BUN 9 13 39* 39*  CREATININE 0.85 1.02* 2.04* 1.79*  CALCIUM 8.5* 8.7* 9.7 9.6  MG  --  1.9  --  2.4   Creatinine: Lab Results  Component Value Date   CREATININE 1.79 (H) 12/08/2023   CREATININE 2.04 (H) 12/07/2023   CREATININE 1.02 (H) 12/03/2023   Liver Function Tests:    Latest Ref Rng & Units 12/08/2023   11:09 AM 11/30/2023    6:56 PM 08/07/2023   11:27 AM  Hepatic Function  Total Protein 6.5 - 8.1 g/dL 7.6  6.6  6.3   Albumin 3.5 - 5.0 g/dL 4.5  3.6  4.0   AST 15 - 41 U/L 23  25  28    ALT 0 - 44 U/L 19  19  14    Alk Phosphatase 38 - 126 U/L 61  52  64   Total Bilirubin 0.0 - 1.2 mg/dL 0.5  0.6  0.3   Bilirubin, Direct 0.0 - 0.2 mg/dL 0.2      Radiological Exams on Admission: No results found.  Data Reviewed: Relevant notes from primary care and specialist visits, past discharge summaries as available in EHR, including Care Everywhere. Prior diagnostic testing as pertinent to current admission diagnoses, Updated medications and problem lists for reconciliation ED course, including vitals, labs, imaging, treatment and response to treatment,Triage notes, nursing and pharmacy notes and ED provider's notes Notable results as noted in HPI.Discussed case with EDMD/ ED APP/ or Specialty MD on call and as needed.  Assessment and Plan: >> CKD stage IV: Pt's kidney function has been well controlled for most part except for intermittent rise x 2 which may be  from medication.  12/14/15 17:26 12/14/15 17:32 01/17/16 11:33 01/17/16 11:48 03/06/21 13:05 03/07/21 02:50 03/08/21 07:35 03/09/21 02:32 03/10/21 05:52 03/11/21 02:30 03/28/22 16:36 07/25/22 11:46 08/07/23 11:27 11/30/23 18:56 12/01/23 03:45 12/02/23 02:50 12/03/23 02:07 12/07/23 12:47 12/08/23 11:09  Creatinine 0.80 0.93 0.91 0.70 3.41 (H) 2.32 (H) 1.58 (H) 1.17 (H) 0.87 0.88 0.96 0.97 1.03 (H) 0.86 0.96 0.85 1.02 (H) 2.04 (H) 1.79 (H)  Patient had a BMP at cardiology office which showed elevated potassium and creatinine of 2.04 which on recheck in the emergency room here was 1.79.  And potassium was 5.1. Medication modification over the time that the patient is hospitalized include holding Aldactone, losartan, decreasing the dose of diltiazem, holding Farxiga, splitting amiodarone 200 twice daily, continuing metoprolol. Renal USG and nephrology consult at some point inside or outside for consult.  Low-grade MIVF with normal saline at 50 cc/h for 1 day.   >>Essential HTN: Vitals:   12/08/23 1022 12/08/23 1345 12/08/23 1400 12/08/23 1415  BP: (!) 146/69 122/63 (!) 136/90 (!) 146/87   12/08/23 1430 12/08/23 1445 12/08/23 1616  BP: (!) 144/79 (!) 161/91 (!) 153/83  Stable to high no low pressures to explain the Rise in creatinine.  Again suspect secondary to medications. Will currently continue patient on diltiazem at 120 mg along with metoprolol 100.  >> Hyperkalemia: Erroneous and resolved.   >> Paroxysmal A.Fib: Cont eliquis.  Sinus rhythm.  Patient continued on amiodarone, metoprolol and diltiazem 120 mg  >> Chronic diastolic congestive heart failure Stable and euvolemic.  Diuretic therapy held.   >> OSA on CPAP: Will continue CPAP per home settings.  DVT prophylaxis:  Eliquis Consults:  None Advance Care Planning:    Code Status: Full Code   Family Communication:  None Disposition Plan:  Home Severity of Illness: The appropriate patient status for this patient is  OBSERVATION. Observation status is judged to be reasonable and necessary in order to provide the required intensity of service to ensure the patient's safety. The patient's presenting symptoms, physical exam findings, and initial radiographic and laboratory data in the context of their medical condition is felt to place them at decreased risk for further clinical deterioration. Furthermore, it is anticipated that the patient will be medically stable for discharge from the hospital within 2 midnights of admission.   Author: Gertha Calkin, MD 12/08/2023 7:38 PM  For on call review [email or web address].   Unresulted Labs (From admission, onward)     Start     Ordered   12/09/23 0500  Comprehensive metabolic panel  Tomorrow morning,   R        12/08/23 1623   12/09/23 0500  CBC  Tomorrow morning,   R        12/08/23 1623   12/09/23 0500  Magnesium  Tomorrow morning,   R        12/08/23 1623            Orders Placed This Encounter  Procedures   Resp panel by RT-PCR (RSV, Flu A&B, Covid) Anterior Nasal Swab   US RENAL   Basic metabolic panel   CBC   Urinalysis, Routine w reflex microscopic -Urine, Clean Catch   Magnesium   Hepatic function panel   Comprehensive metabolic panel   CBC   Magnesium   Diet Heart Room service appropriate? Yes; Fluid consistency: Thin   Cardiac Monitoring Continuous x 24 hours Indications for use: Other; other indications for use: hyperkalemia   Maintain IV access   Vital signs   Notify physician (specify)   Mobility Protocol: No Restrictions RN to initiate protocols based on patient's level of care   Refer to Sidebar Report Refer to ICU, Med-Surg, Progressive, and Step-Down Mobility Protocol Sidebars   Initiate Adult Central Line Maintenance and Catheter Protocol for patients with central line (CVC, PICC, Port, Hemodialysis, Trialysis)   Daily weights   Intake and Output   Do not place and if present remove PureWick   Initiate Oral Care Protocol   Initiate  Carrier Fluid Protocol   RN may order General Admission PRN Orders utilizing "General Admission PRN medications" (through manage orders) for the following patient needs: allergy symptoms (Claritin), cold sores (Carmex), cough (Robitussin DM), eye irritation (Liquifilm Tears), hemorrhoids (Tucks), indigestion (Maalox), minor skin irritation (Hydrocortisone Cream), muscle pain Romeo Apple Gay), nose irritation (saline nasal spray) and sore throat (Chloraseptic spray).   Strict intake and output   Daily weights   Full code   Consult to hospitalist   Pulse oximetry check with vital signs   Oxygen therapy Mode or (Route): Nasal cannula; Liters Per Minute: 2; Keep O2 saturation between: greater than 92 %   Incentive spirometry RT   ED EKG   EKG   EKG   Place in observation (patient's expected length of stay will be less than 2 midnights)   Place in observation (patient's expected length of stay will be less than 2 midnights)   Aspiration precautions   Fall precautions

## 2023-12-08 NOTE — Progress Notes (Unsigned)
 Received a call from [facility] that BMP drawn 12/07/2023 returned a critical K of 6.3.  Not hemolyzed per [facility] reporter.  Recent labs drawn during hospitalization between 2/17 - 2/20 had K ranging between 3.5 and 4.  Patient was discharged home on K 20 mEq/day and lasix discontinued on 12/04/2023.  This may be laboratory error, but will ask AM team to reach out to patient for lab redraw.  Alisha Terrell 12/08/23 3:39am

## 2023-12-08 NOTE — ED Triage Notes (Signed)
 Pt bib wheelchair, referred by pcp, endorses referral d/t hyperkalemia. Endorses leg cramping

## 2023-12-08 NOTE — ED Notes (Signed)
 [facility] at bedside

## 2023-12-08 NOTE — ED Notes (Signed)
 Kim at [facility] called for transport 14:32

## 2023-12-08 NOTE — Plan of Care (Signed)
  Problem: Education: Goal: Knowledge of General Education information will improve Description: Including pain rating scale, medication(s)/side effects and non-pharmacologic comfort measures Outcome: Progressing   Problem: Health Behavior/Discharge Planning: Goal: Ability to manage health-related needs will improve Outcome: Progressing   Problem: Clinical Measurements: Goal: Ability to maintain clinical measurements within normal limits will improve Outcome: Progressing Goal: Will remain free from infection Outcome: Progressing Goal: Diagnostic test results will improve Outcome: Progressing Goal: Respiratory complications will improve Outcome: Progressing Goal: Cardiovascular complication will be avoided Outcome: Progressing   Problem: Activity: Goal: Risk for activity intolerance will decrease Outcome: Progressing   Problem: Nutrition: Goal: Adequate nutrition will be maintained Outcome: Progressing   Problem: Coping: Goal: Level of anxiety will decrease Outcome: Progressing   Problem: Elimination: Goal: Will not experience complications related to bowel motility Outcome: Progressing Goal: Will not experience complications related to urinary retention Outcome: Progressing   Problem: Pain Managment: Goal: General experience of comfort will improve and/or be controlled Outcome: Progressing   Problem: Safety: Goal: Ability to remain free from injury will improve Outcome: Progressing   Problem: Skin Integrity: Goal: Risk for impaired skin integrity will decrease Outcome: Progressing   Problem: Education: Goal: Ability to demonstrate management of disease process will improve Outcome: Progressing Goal: Ability to verbalize understanding of medication therapies will improve Outcome: Progressing Goal: Individualized Educational Video(s) Outcome: Progressing   Problem: Activity: Goal: Capacity to carry out activities will improve Outcome: Progressing    Problem: Cardiac: Goal: Ability to achieve and maintain adequate cardiopulmonary perfusion will improve Outcome: Progressing   Problem: Education: Goal: Knowledge of disease or condition will improve Outcome: Progressing Goal: Understanding of medication regimen will improve Outcome: Progressing Goal: Individualized Educational Video(s) Outcome: Progressing   Problem: Activity: Goal: Ability to tolerate increased activity will improve Outcome: Progressing   Problem: Cardiac: Goal: Ability to achieve and maintain adequate cardiopulmonary perfusion will improve Outcome: Progressing   Problem: Health Behavior/Discharge Planning: Goal: Ability to safely manage health-related needs after discharge will improve Outcome: Progressing

## 2023-12-09 ENCOUNTER — Other Ambulatory Visit (HOSPITAL_COMMUNITY): Payer: Self-pay

## 2023-12-09 ENCOUNTER — Observation Stay (HOSPITAL_COMMUNITY): Payer: Medicare HMO

## 2023-12-09 ENCOUNTER — Ambulatory Visit: Payer: Medicare HMO | Admitting: General Practice

## 2023-12-09 DIAGNOSIS — N179 Acute kidney failure, unspecified: Secondary | ICD-10-CM | POA: Diagnosis not present

## 2023-12-09 DIAGNOSIS — E875 Hyperkalemia: Secondary | ICD-10-CM | POA: Diagnosis not present

## 2023-12-09 LAB — COMPREHENSIVE METABOLIC PANEL
ALT: 20 U/L (ref 0–44)
AST: 21 U/L (ref 15–41)
Albumin: 3.2 g/dL — ABNORMAL LOW (ref 3.5–5.0)
Alkaline Phosphatase: 46 U/L (ref 38–126)
Anion gap: 10 (ref 5–15)
BUN: 21 mg/dL (ref 8–23)
CO2: 21 mmol/L — ABNORMAL LOW (ref 22–32)
Calcium: 8.8 mg/dL — ABNORMAL LOW (ref 8.9–10.3)
Chloride: 101 mmol/L (ref 98–111)
Creatinine, Ser: 1.1 mg/dL — ABNORMAL HIGH (ref 0.44–1.00)
GFR, Estimated: 50 mL/min — ABNORMAL LOW (ref >60)
Glucose, Bld: 96 mg/dL (ref 70–99)
Potassium: 4.5 mmol/L (ref 3.5–5.1)
Sodium: 132 mmol/L — ABNORMAL LOW (ref 135–145)
Total Bilirubin: 0.6 mg/dL (ref 0.0–1.2)
Total Protein: 6 g/dL — ABNORMAL LOW (ref 6.5–8.1)

## 2023-12-09 LAB — CBC
HCT: 35.6 % — ABNORMAL LOW (ref 36.0–46.0)
Hemoglobin: 12 g/dL (ref 12.0–15.0)
MCH: 28.8 pg (ref 26.0–34.0)
MCHC: 33.7 g/dL (ref 30.0–36.0)
MCV: 85.4 fL (ref 80.0–100.0)
Platelets: 349 10*3/uL (ref 150–400)
RBC: 4.17 MIL/uL (ref 3.87–5.11)
RDW: 14.2 % (ref 11.5–15.5)
WBC: 6.9 10*3/uL (ref 4.0–10.5)
nRBC: 0 % (ref 0.0–0.2)

## 2023-12-09 LAB — MAGNESIUM: Magnesium: 2.2 mg/dL (ref 1.7–2.4)

## 2023-12-09 MED ORDER — SPIRONOLACTONE 25 MG PO TABS
12.5000 mg | ORAL_TABLET | Freq: Every day | ORAL | 2 refills | Status: DC
  Filled 2023-12-09: qty 30, 60d supply, fill #0

## 2023-12-09 NOTE — Plan of Care (Signed)
Pt to d/c 

## 2023-12-09 NOTE — Progress Notes (Signed)
 Mobility Specialist Progress Note:    12/09/23 1158  Mobility  Activity Ambulated with assistance in hallway  Level of Assistance Contact guard assist, steadying assist  Assistive Device Front wheel walker  Distance Ambulated (ft) 150 ft  Activity Response Tolerated well  Mobility Referral Yes  Mobility visit 1 Mobility  Mobility Specialist Start Time (ACUTE ONLY) 0945  Mobility Specialist Stop Time (ACUTE ONLY) 1000  Mobility Specialist Time Calculation (min) (ACUTE ONLY) 15 min   Received pt in BR having no complaints and agreeable to mobility. Pt was asymptomatic throughout ambulation and returned to room w/o fault. Left in bed w/ call bell in reach and all needs met.   Thompson Grayer Mobility Specialist  Please contact vis Secure Chat or  Rehab Office [phone]

## 2023-12-09 NOTE — Care Management Obs Status (Cosign Needed)
 MEDICARE OBSERVATION STATUS NOTIFICATION   Patient Details  Name: Alisha Terrell MRN: [id number] Date of Birth: Feb 25, 1941   Medicare Observation Status Notification Given:  Yes    Janae Bridgeman, RN 12/09/2023, 11:10 AM

## 2023-12-09 NOTE — Progress Notes (Signed)
 Transition of Care [facility]) - Inpatient Brief Assessment   Patient Details  Name: Alisha Terrell MRN: [id number] Date of Birth: 10/26/1940  [facility]) CM/SW Contact:    Janae Bridgeman, RN Phone Number: 12/09/2023, 11:27 AM   Clinical Narrative: CM met with the patient at the bedside to discuss TOC needs.  Patient lives at home and plans to return home when medically stable for discharge.  [place] letter provided at the bedside.  DME at the home includes [place], RW, Rolator, CPAP machine with RA.  No TOC needs at this time.  Patient plans to follow up with PCP and schedule hospital follow up in the next 7-10 days.   Transition of Care Asessment: Insurance and Status: (P) Insurance coverage has been reviewed Patient has primary care physician: (P) Yes Home environment has been reviewed: (P) from home alone Prior level of function:: (P) INdependent Prior/Current Home Services: (P) No current home services Social Drivers of Health Review: (P) SDOH reviewed needs interventions Readmission risk has been reviewed: (P) Yes Transition of care needs: (P) transition of care needs identified, TOC will continue to follow

## 2023-12-09 NOTE — Hospital Course (Addendum)
 Alisha Terrell is an 83 yo female with PMH HTN, CHF, PAF, obesity, asthma who presented with outpatient abnormal labs (elevated creatinine and hyperkalemia). She had poor p.o. intake prior to admission and had been continuing to take home medications including potassium supplementation, losartan, torsemide, spironolactone. She was found to have potassium 6.3 prior to admission and creatinine above baseline at 2.04, BUN 39. Home meds were held and she underwent fluid resuscitation. Creat after fluids the following morning was 1.1 and K normalized to 4.5.  Patient was instructed to hold torsemide at discharge until follow up with cardiology or if weight began increasing while weighing daily.  KCL supplements discontinued all together.  Spironolactone was reduced to 12.5 mg daily.

## 2023-12-09 NOTE — Discharge Summary (Addendum)
 Physician Discharge Summary   Alisha Terrell:[id number] DOB: 04-26-1941 DOA: 12/08/2023  PCP: Mattie Marlin, DO  Admit date: 12/08/2023 Discharge date: 12/09/2023   Admitted From: Home Disposition: Home Discharging physician: Lewie Chamber, MD Barriers to discharge: None  Recommendations at discharge: Repeat BMP   Discharge Condition: stable CODE STATUS: Full  Diet recommendation:  Diet Orders (From admission, onward)     Start     Ordered   12/09/23 0000  Diet - low sodium heart healthy        12/09/23 1154   12/08/23 1623  Diet Heart Room service appropriate? Yes; Fluid consistency: Thin  Diet effective now       Question Answer Comment  Room service appropriate? Yes   Fluid consistency: Thin      12/08/23 1623            Hospital Course: Ms. Alisha Terrell is an 83 yo female with PMH HTN, CHF, PAF, obesity, asthma who presented with outpatient abnormal labs (elevated creatinine and hyperkalemia). She had poor p.o. intake prior to admission and had been continuing to take home medications including potassium supplementation, losartan, torsemide, spironolactone. She was found to have potassium 6.3 prior to admission and creatinine above baseline at 2.04, BUN 39. Home meds were held and she underwent fluid resuscitation. Creat after fluids the following morning was 1.1 and K normalized to 4.5.  Patient was instructed to hold torsemide at discharge until follow up with cardiology or if weight began increasing while weighing daily.  KCL supplements discontinued all together.  Spironolactone was reduced to 12.5 mg daily.     The patient's acute and chronic medical conditions were treated accordingly. On day of discharge, patient was felt deemed stable for discharge. Patient/family member advised to call PCP or come back to ER if needed.   Principal Diagnosis: Hyperkalemia  Discharge Diagnoses: Active Hospital Problems   Diagnosis Date Noted   Hyperkalemia  12/08/2023    Priority: 2.   AKI (acute kidney injury) (HCC)     Priority: 1.    Resolved Hospital Problems  No resolved problems to display.     Discharge Instructions     Diet - low sodium heart healthy   Complete by: As directed    Increase activity slowly   Complete by: As directed       Allergies as of 12/09/2023       Reactions   Codeine Rash, Other (See Comments)   Agitation, bad dreams   Omnicef [cefdinir] Nausea And Vomiting   Confirmed with patient at bedside that no rash - just had significant GI effects        Medication List     PAUSE taking these medications    torsemide 20 MG tablet Wait to take this until your doctor or other care provider tells you to start again. Commonly known as: DEMADEX Take 1 tablet (20 mg total) by mouth daily.       STOP taking these medications    potassium chloride SA 20 MEQ tablet Commonly known as: KLOR-CON M       TAKE these medications    acetaminophen 500 MG tablet Commonly known as: TYLENOL Take 500 mg by mouth 2 (two) times daily. Two Tablets Every 6 hours as Needed For Pain   Advair Diskus 250-50 MCG/DOSE Aepb Generic drug: fluticasone-salmeterol Inhale 1 puff into the lungs 2 (two) times daily.   amiodarone 200 MG tablet Commonly known as: PACERONE Take 1 tablet (200 mg total)  by mouth daily.   apixaban 5 MG Tabs tablet Commonly known as: ELIQUIS Take 1 tablet (5 mg total) by mouth 2 (two) times daily.   Azelastine HCl 137 MCG/SPRAY Soln Place 1 spray into both nostrils daily.   calcium carbonate 500 MG chewable tablet Commonly known as: TUMS - dosed in mg elemental calcium Chew 1 tablet by mouth daily as needed for indigestion or heartburn.   CENTRUM SILVER ADULT 50+ PO Take 1 tablet by mouth in the morning.   diclofenac Sodium 1 % Gel Commonly known as: VOLTAREN Apply 2 g topically at bedtime.   diltiazem 300 MG 24 hr capsule Commonly known as: CARDIZEM CD Take 1 capsule by mouth  daily.   Farxiga 10 MG Tabs tablet Generic drug: dapagliflozin propanediol Take 1 tablet (10 mg total) by mouth daily.   fluticasone 50 MCG/ACT nasal spray Commonly known as: FLONASE Place 1 spray into both nostrils every morning.   ibandronate 150 MG tablet Commonly known as: BONIVA Take 150 mg by mouth every 30 (thirty) days.   lidocaine 4 % cream Commonly known as: LMX Apply 1 Application topically as needed (back pain).   losartan 100 MG tablet Commonly known as: COZAAR TAKE 1 TABLET BY MOUTH DAILY   melatonin 5 MG Tabs Take 5 mg by mouth at bedtime.   metoprolol succinate 100 MG 24 hr tablet Commonly known as: TOPROL-XL Take 1 tablet (100 mg total) by mouth daily. Take with or immediately following a meal.   montelukast 10 MG tablet Commonly known as: SINGULAIR Take 10 mg by mouth at bedtime.   omeprazole 40 MG capsule Commonly known as: PRILOSEC Take 40 mg by mouth in the morning and at bedtime.   ProAir HFA 108 (90 Base) MCG/ACT inhaler Generic drug: albuterol Inhale 1-2 puffs into the lungs at bedtime.   ROBITUSSIN COUGH/CHEST DM MAX PO Take 1 mL by mouth 2 (two) times daily as needed (for cold symptoms).   spironolactone 25 MG tablet Commonly known as: ALDACTONE Take 0.5 tablets (12.5 mg total) by mouth daily. What changed: how much to take   traMADol 50 MG tablet Commonly known as: ULTRAM Take 50 mg by mouth every 6 (six) hours as needed.        Follow-up Information     Molli Hazard Thomasene Ripple, NP Follow up.   Specialty: Cardiology Why: Wednesday Dec 30, 2023 Appt at 2:20 PM (25 min) Contact information: [address] [place] [address] [phone]                Allergies  Allergen Reactions   Codeine Rash and Other (See Comments)    Agitation, bad dreams   Omnicef [Cefdinir] Nausea And Vomiting    Confirmed with patient at bedside that no rash - just had significant GI effects     Consultations:   Procedures:   Discharge Exam: BP (!) 144/73 (BP Location: Right Arm)   Pulse 67   Temp (!) 97.5 F (36.4 C) (Oral)   Resp 18   Ht 4' 8.5" (1.435 m)   Wt 72.7 kg   SpO2 100%   BMI 35.28 kg/m  Physical Exam Constitutional:      Appearance: Normal appearance.  HENT:     Head: Normocephalic and atraumatic.     Mouth/Throat:     Mouth: Mucous membranes are moist.  Eyes:     Extraocular Movements: Extraocular movements intact.  Cardiovascular:     Rate and Rhythm: Normal rate and regular  rhythm.  Pulmonary:     Effort: Pulmonary effort is normal. No respiratory distress.     Breath sounds: Normal breath sounds. No wheezing.  Abdominal:     General: Bowel sounds are normal. There is no distension.     Palpations: Abdomen is soft.     Tenderness: There is no abdominal tenderness.  Musculoskeletal:        General: Normal range of motion.     Cervical back: Normal range of motion and neck supple.  Skin:    General: Skin is warm and dry.  Neurological:     General: No focal deficit present.     Mental Status: She is alert.  Psychiatric:        Mood and Affect: Mood normal.      The results of significant diagnostics from this hospitalization (including imaging, microbiology, ancillary and laboratory) are listed below for reference.   Microbiology: Recent Results (from the past 240 hours)  Resp panel by RT-PCR (RSV, Flu A&B, Covid) Anterior Nasal Swab     Status: None   Collection Time: 12/08/23  1:28 PM   Specimen: Anterior Nasal Swab  Result Value Ref Range Status   SARS Coronavirus 2 by RT PCR NEGATIVE NEGATIVE Final    Comment: (NOTE) SARS-CoV-2 target nucleic acids are NOT DETECTED.  The SARS-CoV-2 RNA is generally detectable in upper respiratory specimens during the acute phase of infection. The lowest concentration of SARS-CoV-2 viral copies this assay can detect is 138 copies/mL. A negative result does not preclude SARS-Cov-2 infection  and should not be used as the sole basis for treatment or other patient management decisions. A negative result may occur with  improper specimen collection/handling, submission of specimen other than nasopharyngeal swab, presence of viral mutation(s) within the areas targeted by this assay, and inadequate number of viral copies(<138 copies/mL). A negative result must be combined with clinical observations, patient history, and epidemiological information. The expected result is Negative.  Fact Sheet for Patients:  BloggerCourse.com  Fact Sheet for Healthcare Providers:  SeriousBroker.it  This test is no t yet approved or cleared by the [place] FDA and  has been authorized for detection and/or diagnosis of SARS-CoV-2 by FDA under an Emergency Use Authorization (EUA). This EUA will remain  in effect (meaning this test can be used) for the duration of the COVID-19 declaration under Section 564(b)(1) of the Act, 21 U.S.C.section 360bbb-3(b)(1), unless the authorization is terminated  or revoked sooner.       Influenza A by PCR NEGATIVE NEGATIVE Final   Influenza B by PCR NEGATIVE NEGATIVE Final    Comment: (NOTE) The Xpert Xpress SARS-CoV-2/FLU/RSV plus assay is intended as an aid in the diagnosis of influenza from Nasopharyngeal swab specimens and should not be used as a sole basis for treatment. Nasal washings and aspirates are unacceptable for Xpert Xpress SARS-CoV-2/FLU/RSV testing.  Fact Sheet for Patients: BloggerCourse.com  Fact Sheet for Healthcare Providers: SeriousBroker.it  This test is not yet approved or cleared by the [place] FDA and has been authorized for detection and/or diagnosis of SARS-CoV-2 by FDA under an Emergency Use Authorization (EUA). This EUA will remain in effect (meaning this test can be used) for the duration of the COVID-19 declaration  under Section 564(b)(1) of the Act, 21 U.S.C. section 360bbb-3(b)(1), unless the authorization is terminated or revoked.     Resp Syncytial Virus by PCR NEGATIVE NEGATIVE Final    Comment: (NOTE) Fact Sheet for Patients: BloggerCourse.com  Fact Sheet for  Healthcare Providers: SeriousBroker.it  This test is not yet approved or cleared by the [place] and has been authorized for detection and/or diagnosis of SARS-CoV-2 by FDA under an Emergency Use Authorization (EUA). This EUA will remain in effect (meaning this test can be used) for the duration of the COVID-19 declaration under Section 564(b)(1) of the Act, 21 U.S.C. section 360bbb-3(b)(1), unless the authorization is terminated or revoked.  Performed at [facility], [address] [place], [place] [address]      Labs: BNP (last 3 results) Recent Labs    11/30/23 1856  BNP 237.4*   Basic Metabolic Panel: Recent Labs  Lab 12/03/23 0207 12/07/23 1247 12/08/23 1109 12/09/23 0700  NA 133* 131* 128* 132*  K 4.0 6.3* 5.1 4.5  CL 100 91* 93* 101  CO2 23 23 25  21*  GLUCOSE 104* 88 112* 96  BUN 13 39* 39* 21  CREATININE 1.02* 2.04* 1.79* 1.10*  CALCIUM 8.7* 9.7 9.6 8.8*  MG 1.9  --  2.4 2.2   Liver Function Tests: Recent Labs  Lab 12/08/23 1109 12/09/23 0700  AST 23 21  ALT 19 20  ALKPHOS 61 46  BILITOT 0.5 0.6  PROT 7.6 6.0*  ALBUMIN 4.5 3.2*   No results for input(s): "LIPASE", "AMYLASE" in the last 168 hours. No results for input(s): "AMMONIA" in the last 168 hours. CBC: Recent Labs  Lab 12/08/23 1109 12/09/23 0700  WBC 6.5 6.9  HGB 12.6 12.0  HCT 38.0 35.6*  MCV 86.0 85.4  PLT 377 349   Cardiac Enzymes: No results for input(s): "CKTOTAL", "CKMB", "CKMBINDEX", "TROPONINI" in the last 168 hours. BNP: Invalid input(s): "POCBNP" CBG: No results for input(s): "GLUCAP" in the last 168 hours. D-Dimer No results for  input(s): "DDIMER" in the last 72 hours. Hgb A1c No results for input(s): "HGBA1C" in the last 72 hours. Lipid Profile No results for input(s): "CHOL", "HDL", "LDLCALC", "TRIG", "CHOLHDL", "LDLDIRECT" in the last 72 hours. Thyroid function studies No results for input(s): "TSH", "T4TOTAL", "T3FREE", "THYROIDAB" in the last 72 hours.  Invalid input(s): "FREET3" Anemia work up No results for input(s): "VITAMINB12", "FOLATE", "FERRITIN", "TIBC", "IRON", "RETICCTPCT" in the last 72 hours. Urinalysis    Component Value Date/Time   COLORURINE YELLOW 12/08/2023 1329   APPEARANCEUR CLEAR 12/08/2023 1329   LABSPEC 1.016 12/08/2023 1329   PHURINE 6.0 12/08/2023 1329   GLUCOSEU 250 (A) 12/08/2023 1329   HGBUR NEGATIVE 12/08/2023 1329   BILIRUBINUR NEGATIVE 12/08/2023 1329   KETONESUR NEGATIVE 12/08/2023 1329   PROTEINUR NEGATIVE 12/08/2023 1329   NITRITE NEGATIVE 12/08/2023 1329   LEUKOCYTESUR NEGATIVE 12/08/2023 1329   Sepsis Labs Recent Labs  Lab 12/08/23 1109 12/09/23 0700  WBC 6.5 6.9   Microbiology Recent Results (from the past 240 hours)  Resp panel by RT-PCR (RSV, Flu A&B, Covid) Anterior Nasal Swab     Status: None   Collection Time: 12/08/23  1:28 PM   Specimen: Anterior Nasal Swab  Result Value Ref Range Status   SARS Coronavirus 2 by RT PCR NEGATIVE NEGATIVE Final    Comment: (NOTE) SARS-CoV-2 target nucleic acids are NOT DETECTED.  The SARS-CoV-2 RNA is generally detectable in upper respiratory specimens during the acute phase of infection. The lowest concentration of SARS-CoV-2 viral copies this assay can detect is 138 copies/mL. A negative result does not preclude SARS-Cov-2 infection and should not be used as the sole basis for treatment or other patient management decisions. A negative result may occur with  improper specimen collection/handling, submission of specimen other than nasopharyngeal swab, presence of viral mutation(s) within the areas targeted by  this assay, and inadequate number of viral copies(<138 copies/mL). A negative result must be combined with clinical observations, patient history, and epidemiological information. The expected result is Negative.  Fact Sheet for Patients:  BloggerCourse.com  Fact Sheet for Healthcare Providers:  SeriousBroker.it  This test is no t yet approved or cleared by the [place] FDA and  has been authorized for detection and/or diagnosis of SARS-CoV-2 by FDA under an Emergency Use Authorization (EUA). This EUA will remain  in effect (meaning this test can be used) for the duration of the COVID-19 declaration under Section 564(b)(1) of the Act, 21 U.S.C.section 360bbb-3(b)(1), unless the authorization is terminated  or revoked sooner.       Influenza A by PCR NEGATIVE NEGATIVE Final   Influenza B by PCR NEGATIVE NEGATIVE Final    Comment: (NOTE) The Xpert Xpress SARS-CoV-2/FLU/RSV plus assay is intended as an aid in the diagnosis of influenza from Nasopharyngeal swab specimens and should not be used as a sole basis for treatment. Nasal washings and aspirates are unacceptable for Xpert Xpress SARS-CoV-2/FLU/RSV testing.  Fact Sheet for Patients: BloggerCourse.com  Fact Sheet for Healthcare Providers: SeriousBroker.it  This test is not yet approved or cleared by the [place] FDA and has been authorized for detection and/or diagnosis of SARS-CoV-2 by FDA under an Emergency Use Authorization (EUA). This EUA will remain in effect (meaning this test can be used) for the duration of the COVID-19 declaration under Section 564(b)(1) of the Act, 21 U.S.C. section 360bbb-3(b)(1), unless the authorization is terminated or revoked.     Resp Syncytial Virus by PCR NEGATIVE NEGATIVE Final    Comment: (NOTE) Fact Sheet for Patients: BloggerCourse.com  Fact Sheet  for Healthcare Providers: SeriousBroker.it  This test is not yet approved or cleared by the [place] FDA and has been authorized for detection and/or diagnosis of SARS-CoV-2 by FDA under an Emergency Use Authorization (EUA). This EUA will remain in effect (meaning this test can be used) for the duration of the COVID-19 declaration under Section 564(b)(1) of the Act, 21 U.S.C. section 360bbb-3(b)(1), unless the authorization is terminated or revoked.  Performed at [facility], [address], [place], [place] [address]     Procedures/Studies: US RENAL Result Date: 12/09/2023 CLINICAL DATA:  Acute on chronic kidney disease EXAM: RENAL / URINARY TRACT ULTRASOUND COMPLETE COMPARISON:  None Available. FINDINGS: Right Kidney: Renal measurements: 8.5 x 3.7 x 3.8 cm = volume: 62 mL. Echogenicity within normal limits. No mass or hydronephrosis visualized. Left Kidney: Renal measurements: 7.7 x 4.4 x 4.1 cm = volume: 71.5 mL. Echogenicity within normal limits. No mass or hydronephrosis visualized. Bladder: Appears normal for degree of bladder distention. Other: None. IMPRESSION: Negative renal ultrasound. Electronically Signed   By: Charline Bills M.D.   On: 12/09/2023 02:32   ECHOCARDIOGRAM COMPLETE Result Date: 12/01/2023    ECHOCARDIOGRAM REPORT   Patient Name:   Alisha Terrell Date of Exam: 12/01/2023 Medical Rec #:  [id number]          Height:       56.5 in Accession #:    [id number]         Weight:       174.2 lb Date of Birth:  Oct 28, 1940          BSA:          1.685 m Patient Age:  82 years           BP:           137/86 mmHg Patient Gender: F                  HR:           61 bpm. Exam Location:  Inpatient Procedure: 2D Echo, Color Doppler and Cardiac Doppler (Both Spectral and Color            Flow Doppler were utilized during procedure). Indications:    CHF I50.31  History:        Patient has prior history of Echocardiogram examinations,  most                 recent 03/08/2021.  Sonographer:    Harriette Bouillon RDCS Referring Phys: [phone] KATHRYN M LAWRENCE IMPRESSIONS  1. Left ventricular ejection fraction, by estimation, is 55 to 60%. The left ventricle has normal function. The left ventricle has no regional wall motion abnormalities. There is mild concentric left ventricular hypertrophy. Left ventricular diastolic parameters are indeterminate.  2. Right ventricular systolic function is normal. The right ventricular size is severely enlarged. Tricuspid regurgitation signal is inadequate for assessing PA pressure.  3. Left atrial size was mild to moderately dilated.  4. The mitral valve is grossly normal. Mild mitral valve regurgitation. No evidence of mitral stenosis.  5. The aortic valve is tricuspid. Aortic valve regurgitation is not visualized. No aortic stenosis is present.  6. Aortic dilatation noted. There is mild dilatation of the ascending aorta, measuring 40 mm.  7. The inferior vena cava is dilated in size with >50% respiratory variability, suggesting right atrial pressure of 8 mmHg. Comparison(s): Aortic dimensions increased from prior. FINDINGS  Left Ventricle: Left ventricular ejection fraction, by estimation, is 55 to 60%. The left ventricle has normal function. The left ventricle has no regional wall motion abnormalities. Strain imaging was not performed. The left ventricular internal cavity  size was normal in size. There is mild concentric left ventricular hypertrophy. Left ventricular diastolic parameters are indeterminate. Right Ventricle: The right ventricular size is severely enlarged. No increase in right ventricular wall thickness. Right ventricular systolic function is normal. Tricuspid regurgitation signal is inadequate for assessing PA pressure. Left Atrium: Left atrial size was mild to moderately dilated. Right Atrium: Right atrial size was normal in size. Pericardium: There is no evidence of pericardial effusion. Mitral Valve:  The mitral valve is grossly normal. Mild mitral valve regurgitation. No evidence of mitral valve stenosis. Tricuspid Valve: The tricuspid valve is normal in structure. Tricuspid valve regurgitation is not demonstrated. Aortic Valve: The aortic valve is tricuspid. Aortic valve regurgitation is not visualized. No aortic stenosis is present. Pulmonic Valve: The pulmonic valve was normal in structure. Pulmonic valve regurgitation is mild. No evidence of pulmonic stenosis. Aorta: Aortic dilatation noted. There is mild dilatation of the ascending aorta, measuring 40 mm. Venous: The inferior vena cava is dilated in size with greater than 50% respiratory variability, suggesting right atrial pressure of 8 mmHg. IAS/Shunts: The atrial septum is grossly normal. Additional Comments: 3D imaging was not performed.  LEFT VENTRICLE PLAX 2D LVIDd:         5.10 cm   Diastology LVIDs:         3.50 cm   LV e' medial:    4.03 cm/s LV PW:         1.10 cm   LV E/e' medial:  25.8 LV IVS:  1.20 cm   LV e' lateral:   13.20 cm/s LVOT diam:     2.20 cm   LV E/e' lateral: 7.9 LV SV:         83 LV SV Index:   49 LVOT Area:     3.80 cm  RIGHT VENTRICLE         IVC TAPSE (M-mode): 1.7 cm  IVC diam: 2.10 cm LEFT ATRIUM             Index        RIGHT ATRIUM           Index LA diam:        4.40 cm 2.61 cm/m   RA Area:     17.50 cm LA Vol (A2C):   68.4 ml 40.60 ml/m  RA Volume:   53.90 ml  31.99 ml/m LA Vol (A4C):   68.7 ml 40.77 ml/m LA Biplane Vol: 69.3 ml 41.13 ml/m  AORTIC VALVE LVOT Vmax:   99.70 cm/s LVOT Vmean:  70.500 cm/s LVOT VTI:    0.218 m  AORTA Ao Root diam: 3.20 cm Ao Asc diam:  4.00 cm MITRAL VALVE MV Area (PHT): 3.56 cm     SHUNTS MV Decel Time: 213 msec     Systemic VTI:  0.22 m MV E velocity: 104.00 cm/s  Systemic Diam: 2.20 cm MV A velocity: 72.40 cm/s MV E/A ratio:  1.44 Riley Lam MD Electronically signed by Riley Lam MD Signature Date/Time: 12/01/2023/5:19:50 PM    Final    DG Chest Port 1  View Result Date: 12/01/2023 CLINICAL DATA:  [id number] CHF (congestive heart failure), NYHA class I, acute on chronic, combined (HCC) [id number] EXAM: PORTABLE CHEST 1 VIEW COMPARISON:  Mar 03, 2021 FINDINGS: The cardiomediastinal silhouette is unchanged in contour.Perihilar vascular prominence. No pleural effusion. No pneumothorax. Similar background of interstitial prominence with some linear appearing opacities at the LEFT lung base. Coarse calcifications of the RIGHT hepatic dome. IMPRESSION: Constellation of findings are favored to reflect background of mild pulmonary edema with scattered atelectasis. Electronically Signed   By: Meda Klinefelter M.D.   On: 12/01/2023 08:07     Time coordinating discharge: Over 30 minutes    Lewie Chamber, MD  [facility] 12/09/2023, 5:34 PM

## 2023-12-09 NOTE — Discharge Instructions (Signed)
 Weight yourself daily. If you gain 3-5 pounds in approximately 1-2 days then resume taking torsemide. Otherwise hold until follow up with cardiology

## 2023-12-25 ENCOUNTER — Ambulatory Visit: Payer: Medicare HMO | Admitting: Pulmonary Disease

## 2023-12-25 ENCOUNTER — Encounter: Payer: Self-pay | Admitting: Pulmonary Disease

## 2023-12-25 VITALS — BP 157/85 | HR 60 | Temp 98.0°F | Ht <= 58 in | Wt 167.6 lb

## 2023-12-25 DIAGNOSIS — R911 Solitary pulmonary nodule: Secondary | ICD-10-CM | POA: Diagnosis not present

## 2023-12-25 NOTE — Progress Notes (Signed)
 @Patient  ID: Alisha Terrell, female    DOB: January 06, 1941, 83 y.o.   MRN: [id number]  Chief Complaint  Patient presents with   Consult    Referring provider: Crista Elliot, MD  HPI:   83 y.o. woman whom we are seeing for evaluation of "lesion of lung.".  Multiple urology notes reviewed.  Multiple PCP notes reviewed.  Patient not quite sure why she is here.  No respiratory symptoms.  They said there is may be something in her lung.  She does not recall details.  They did discuss much with her.  Recent issues with congestive heart failure etc. have have her distracted, more significant issues on her mind.  Reviewed CT abdomen pelvis June 2024 reveals a right sided rounded nodule abutting the heart, likely small benign nodule versus possible mediastinal prominent lymph node given location.  Subsequent CT scan 08/2023 reviewed this is unchanged with a area of calcification superiorly in this nodule/possible lymph node.  Reviewed CT scan from 2015 that shows similar position of nodule.  9 mm in largest measurement at that time in 2015, up to 12 mm largest measurement CT scans 08/2023.  We discussed in detail duplicates of is likely benign.  Especially with calcification.  Small chance of slow-growing cancer.  However very slow-growing over 10 years.  Unlikely to be a major problem for her.  See below for further discussion.  Again felt more likely benign.    Questionaires / Pulmonary Flowsheets:   ACT:      No data to display          MMRC:     No data to display          Epworth:      No data to display          Tests:   FENO:  No results found for: "NITRICOXIDE"  PFT:     No data to display          WALK:      No data to display          Imaging: Personally reviewed and as per EMR and discussion of this note US RENAL Result Date: 12/09/2023 CLINICAL DATA:  Acute on chronic kidney disease EXAM: RENAL / URINARY TRACT ULTRASOUND COMPLETE  COMPARISON:  None Available. FINDINGS: Right Kidney: Renal measurements: 8.5 x 3.7 x 3.8 cm = volume: 62 mL. Echogenicity within normal limits. No mass or hydronephrosis visualized. Left Kidney: Renal measurements: 7.7 x 4.4 x 4.1 cm = volume: 71.5 mL. Echogenicity within normal limits. No mass or hydronephrosis visualized. Bladder: Appears normal for degree of bladder distention. Other: None. IMPRESSION: Negative renal ultrasound. Electronically Signed   By: Charline Bills M.D.   On: 12/09/2023 02:32   ECHOCARDIOGRAM COMPLETE Result Date: 12/01/2023    ECHOCARDIOGRAM REPORT   Patient Name:   Alisha Terrell Date of Exam: 12/01/2023 Medical Rec #:  [id number]          Height:       56.5 in Accession #:    [id number]         Weight:       174.2 lb Date of Birth:  08-15-1941          BSA:          1.685 m Patient Age:    82 years           BP:           137/86 mmHg  Patient Gender: F                  HR:           61 bpm. Exam Location:  Inpatient Procedure: 2D Echo, Color Doppler and Cardiac Doppler (Both Spectral and Color            Flow Doppler were utilized during procedure). Indications:    CHF I50.31  History:        Patient has prior history of Echocardiogram examinations, most                 recent 03/08/2021.  Sonographer:    Harriette Bouillon RDCS Referring Phys: [phone] KATHRYN M LAWRENCE IMPRESSIONS  1. Left ventricular ejection fraction, by estimation, is 55 to 60%. The left ventricle has normal function. The left ventricle has no regional wall motion abnormalities. There is mild concentric left ventricular hypertrophy. Left ventricular diastolic parameters are indeterminate.  2. Right ventricular systolic function is normal. The right ventricular size is severely enlarged. Tricuspid regurgitation signal is inadequate for assessing PA pressure.  3. Left atrial size was mild to moderately dilated.  4. The mitral valve is grossly normal. Mild mitral valve regurgitation. No evidence of mitral stenosis.  5.  The aortic valve is tricuspid. Aortic valve regurgitation is not visualized. No aortic stenosis is present.  6. Aortic dilatation noted. There is mild dilatation of the ascending aorta, measuring 40 mm.  7. The inferior vena cava is dilated in size with >50% respiratory variability, suggesting right atrial pressure of 8 mmHg. Comparison(s): Aortic dimensions increased from prior. FINDINGS  Left Ventricle: Left ventricular ejection fraction, by estimation, is 55 to 60%. The left ventricle has normal function. The left ventricle has no regional wall motion abnormalities. Strain imaging was not performed. The left ventricular internal cavity  size was normal in size. There is mild concentric left ventricular hypertrophy. Left ventricular diastolic parameters are indeterminate. Right Ventricle: The right ventricular size is severely enlarged. No increase in right ventricular wall thickness. Right ventricular systolic function is normal. Tricuspid regurgitation signal is inadequate for assessing PA pressure. Left Atrium: Left atrial size was mild to moderately dilated. Right Atrium: Right atrial size was normal in size. Pericardium: There is no evidence of pericardial effusion. Mitral Valve: The mitral valve is grossly normal. Mild mitral valve regurgitation. No evidence of mitral valve stenosis. Tricuspid Valve: The tricuspid valve is normal in structure. Tricuspid valve regurgitation is not demonstrated. Aortic Valve: The aortic valve is tricuspid. Aortic valve regurgitation is not visualized. No aortic stenosis is present. Pulmonic Valve: The pulmonic valve was normal in structure. Pulmonic valve regurgitation is mild. No evidence of pulmonic stenosis. Aorta: Aortic dilatation noted. There is mild dilatation of the ascending aorta, measuring 40 mm. Venous: The inferior vena cava is dilated in size with greater than 50% respiratory variability, suggesting right atrial pressure of 8 mmHg. IAS/Shunts: The atrial septum  is grossly normal. Additional Comments: 3D imaging was not performed.  LEFT VENTRICLE PLAX 2D LVIDd:         5.10 cm   Diastology LVIDs:         3.50 cm   LV e' medial:    4.03 cm/s LV PW:         1.10 cm   LV E/e' medial:  25.8 LV IVS:        1.20 cm   LV e' lateral:   13.20 cm/s LVOT diam:     2.20 cm  LV E/e' lateral: 7.9 LV SV:         83 LV SV Index:   49 LVOT Area:     3.80 cm  RIGHT VENTRICLE         IVC TAPSE (M-mode): 1.7 cm  IVC diam: 2.10 cm LEFT ATRIUM             Index        RIGHT ATRIUM           Index LA diam:        4.40 cm 2.61 cm/m   RA Area:     17.50 cm LA Vol (A2C):   68.4 ml 40.60 ml/m  RA Volume:   53.90 ml  31.99 ml/m LA Vol (A4C):   68.7 ml 40.77 ml/m LA Biplane Vol: 69.3 ml 41.13 ml/m  AORTIC VALVE LVOT Vmax:   99.70 cm/s LVOT Vmean:  70.500 cm/s LVOT VTI:    0.218 m  AORTA Ao Root diam: 3.20 cm Ao Asc diam:  4.00 cm MITRAL VALVE MV Area (PHT): 3.56 cm     SHUNTS MV Decel Time: 213 msec     Systemic VTI:  0.22 m MV E velocity: 104.00 cm/s  Systemic Diam: 2.20 cm MV A velocity: 72.40 cm/s MV E/A ratio:  1.44 Riley Lam MD Electronically signed by Riley Lam MD Signature Date/Time: 12/01/2023/5:19:50 PM    Final    DG Chest Port 1 View Result Date: 12/01/2023 CLINICAL DATA:  [id number] CHF (congestive heart failure), NYHA class I, acute on chronic, combined (HCC) [id number] EXAM: PORTABLE CHEST 1 VIEW COMPARISON:  Mar 03, 2021 FINDINGS: The cardiomediastinal silhouette is unchanged in contour.Perihilar vascular prominence. No pleural effusion. No pneumothorax. Similar background of interstitial prominence with some linear appearing opacities at the LEFT lung base. Coarse calcifications of the RIGHT hepatic dome. IMPRESSION: Constellation of findings are favored to reflect background of mild pulmonary edema with scattered atelectasis. Electronically Signed   By: Meda Klinefelter M.D.   On: 12/01/2023 08:07    Lab Results: Personally reviewed CBC    Component  Value Date/Time   WBC 6.9 12/09/2023 0700   RBC 4.17 12/09/2023 0700   HGB 12.0 12/09/2023 0700   HGB 12.5 03/28/2022 1636   HCT 35.6 (L) 12/09/2023 0700   HCT 36.1 03/28/2022 1636   PLT 349 12/09/2023 0700   PLT 295 03/28/2022 1636   MCV 85.4 12/09/2023 0700   MCV 85 03/28/2022 1636   MCH 28.8 12/09/2023 0700   MCHC 33.7 12/09/2023 0700   RDW 14.2 12/09/2023 0700   RDW 14.2 03/28/2022 1636   LYMPHSABS 1.1 11/30/2023 1856   MONOABS 0.9 11/30/2023 1856   EOSABS 0.3 11/30/2023 1856   BASOSABS 0.0 11/30/2023 1856    BMET    Component Value Date/Time   NA 132 (L) 12/09/2023 0700   NA 131 (L) 12/07/2023 1247   K 4.5 12/09/2023 0700   CL 101 12/09/2023 0700   CO2 21 (L) 12/09/2023 0700   GLUCOSE 96 12/09/2023 0700   BUN 21 12/09/2023 0700   BUN 39 (H) 12/07/2023 1247   CREATININE 1.10 (H) 12/09/2023 0700   CALCIUM 8.8 (L) 12/09/2023 0700   GFRNONAA 50 (L) 12/09/2023 0700   GFRAA >60 01/17/2016 1133    BNP    Component Value Date/Time   BNP 237.4 (H) 11/30/2023 1856    ProBNP No results found for: "PROBNP"  Specialty Problems       Pulmonary Problems   Allergic rhinitis  OSA (obstructive sleep apnea)   COPD/ asthma    Allergies  Allergen Reactions   Codeine Rash and Other (See Comments)    Agitation, bad dreams   Omnicef [Cefdinir] Nausea And Vomiting    Confirmed with patient at bedside that no rash - just had significant GI effects    Immunization History  Administered Date(s) Administered   Influenza, High Dose Seasonal PF 07/11/2015, 07/15/2016, 07/29/2017, 06/09/2018, 06/08/2019, 06/08/2019   Moderna Covid-19 Fall Seasonal Vaccine 53yrs & older 11/24/2023   PFIZER(Purple Top)SARS-COV-2 Vaccination 11/02/2019, 11/23/2019   Pfizer Covid-19 Vaccine Bivalent Booster 73yrs & up 08/22/2021   Pneumococcal Conjugate-13 06/02/2014   Pneumococcal-Unspecified 01/29/2007   Tdap 04/26/2017   Unspecified SARS-COV-2 Vaccination 08/27/2020, 07/16/2022   Zoster  Recombinant(Shingrix) 12/22/2018, 12/22/2018, 03/10/2019, 03/10/2019   Zoster, Live 10/09/2010    Past Medical History:  Diagnosis Date   ARF (acute renal failure) (HCC)    2020  resolved  it was presrnt when she was septic   Arthritis    Asthma    CHF (congestive heart failure) (HCC)    Chronic back pain    Class 2 obesity due to excess calories with body mass index (BMI) of 35.0 to 35.9 in adult    Dyspnea    ocassional   GERD (gastroesophageal reflux disease)    Headache    Hypertension    OSA (obstructive sleep apnea)    PAF (paroxysmal atrial fibrillation) (HCC)    TIA (transient ischemic attack) 2018   no resedule    Tobacco History: Social History   Tobacco Use  Smoking Status Never   Passive exposure: Yes  Smokeless Tobacco Never   Counseling given: Not Answered   Continue to not smoke  Outpatient Encounter Medications as of 12/25/2023  Medication Sig   acetaminophen (TYLENOL) 500 MG tablet Take 500 mg by mouth 2 (two) times daily. Two Tablets Every 6 hours as Needed For Pain   ADVAIR DISKUS 250-50 MCG/DOSE AEPB Inhale 1 puff into the lungs 2 (two) times daily.    amiodarone (PACERONE) 200 MG tablet Take 1 tablet (200 mg total) by mouth daily.   apixaban (ELIQUIS) 5 MG TABS tablet Take 1 tablet (5 mg total) by mouth 2 (two) times daily.   Azelastine HCl 137 MCG/SPRAY SOLN Place 1 spray into both nostrils daily.   calcium carbonate (TUMS - DOSED IN MG ELEMENTAL CALCIUM) 500 MG chewable tablet Chew 1 tablet by mouth daily as needed for indigestion or heartburn.   diclofenac Sodium (VOLTAREN) 1 % GEL Apply 2 g topically at bedtime.   diltiazem (CARDIZEM CD) 300 MG 24 hr capsule Take 1 capsule by mouth daily.   fluticasone (FLONASE) 50 MCG/ACT nasal spray Place 1 spray into both nostrils every morning.   ibandronate (BONIVA) 150 MG tablet Take 150 mg by mouth every 30 (thirty) days.    lidocaine (LMX) 4 % cream Apply 1 Application topically as needed (back pain).    losartan (COZAAR) 100 MG tablet TAKE 1 TABLET BY MOUTH DAILY   melatonin 5 MG TABS Take 5 mg by mouth at bedtime.   metoprolol succinate (TOPROL-XL) 100 MG 24 hr tablet Take 1 tablet (100 mg total) by mouth daily. Take with or immediately following a meal.   montelukast (SINGULAIR) 10 MG tablet Take 10 mg by mouth at bedtime.   Multiple Vitamins-Minerals (CENTRUM SILVER ADULT 50+ PO) Take 1 tablet by mouth in the morning.   omeprazole (PRILOSEC) 40 MG capsule Take 40 mg by mouth in  the morning and at bedtime.    PROAIR HFA 108 (90 Base) MCG/ACT inhaler Inhale 1-2 puffs into the lungs at bedtime.   spironolactone (ALDACTONE) 25 MG tablet Take 0.5 tablets (12.5 mg total) by mouth daily.   traMADol (ULTRAM) 50 MG tablet Take 50 mg by mouth every 6 (six) hours as needed.   dapagliflozin propanediol (FARXIGA) 10 MG TABS tablet Take 1 tablet (10 mg total) by mouth daily. (Patient not taking: Reported on 12/25/2023)   Dextromethorphan-Guaifenesin (ROBITUSSIN COUGH/CHEST DM MAX PO) Take 1 mL by mouth 2 (two) times daily as needed (for cold symptoms).  (Patient not taking: Reported on 12/25/2023)   [Paused] torsemide (DEMADEX) 20 MG tablet Take 1 tablet (20 mg total) by mouth daily. (Patient not taking: Reported on 12/25/2023)   No facility-administered encounter medications on file as of 12/25/2023.     Review of Systems  Review of Systems  No chest pain with exertion.  No orthopnea or PND.  Comprehensive review of systems otherwise negative. Physical Exam  BP (!) 157/85 (BP Location: Left Arm, Patient Position: Sitting, Cuff Size: Small)   Pulse 60   Temp 98 F (36.7 C) (Oral)   Ht 4\' 9"  (1.448 m)   Wt 167 lb 9.6 oz (76 kg)   SpO2 100%   BMI 36.27 kg/m   Wt Readings from Last 5 Encounters:  12/25/23 167 lb 9.6 oz (76 kg)  12/09/23 160 lb 3.2 oz (72.7 kg)  12/04/23 165 lb 5.5 oz (75 kg)  11/30/23 184 lb (83.5 kg)  10/08/23 172 lb (78 kg)    BMI Readings from Last 5 Encounters:   12/25/23 36.27 kg/m  12/09/23 35.28 kg/m  12/04/23 36.42 kg/m  11/30/23 39.82 kg/m  10/08/23 38.56 kg/m     Physical Exam General: Sitting in chair, elderly appearing Eyes: EOMI, no icterus  neck: Supple, no JVP appreciated Pulmonary: Clear, normal work of breathing Cardiovascular: Pitting edema to midshin bilaterally noted, warm Abdomen: Nondistended MSK: No send vascular no joint effusion Neuro: Ambulates with a walker, no focal deficit Psych: Normal mood, full affect   Assessment & Plan:   Right-sided pulmonary nodule: Present since 2015.  9 mm at that time, up to 12 mm on most recent scans of the abdomen pelvis.  I suspect this due to technique given thicker slices the time we missed the majority of the nodule.  Suspect this is a benign nodule versus a prominent lymph node, there is small area of calcification on most recent scan which is overall reassuring.  We discussed it is possible this is a slow-growing cancer but given minimal interval growth in 10 years if this is the case is unlikely to cause any problem for her in the future.  Most likely this is a benign nodule it has been there for a decade or more.  We discussed the implications of possible cancer, not an ideal or reasonable or realistic location for radiation given abutting the heart.  She is a poor candidate, chemotherapy seems difficult to tolerate, unlikely to tolerate.  She expressed understanding and agrees with the plan for no further intervention, even if it is cancer she would not want treatment.   Return if symptoms worsen or fail to improve.   Karren Burly, MD 12/25/2023   This appointment required 45 minutes of patient care (this includes precharting, chart review, review of results, face-to-face care, etc.).

## 2023-12-25 NOTE — Patient Instructions (Signed)
 Nice to meet you  There is a small nodule on the right lung.  This has been present since 2015 at least.  There is slight changes on the CT scan over the last decade.  This is either due to technique of the scan.  Worse case there is a very slow-growing cancer.  With your age and the rate of growth is demonstrated if it is cancer I do not think we should worry about it.  Again I think it is a benign nodule that is been there for some time.  Return to clinic as needed.

## 2023-12-27 NOTE — Progress Notes (Unsigned)
 Cardiology Clinic Note   Patient Name: Alisha Terrell Date of Encounter: 12/30/2023  Primary Care Provider:  Mattie Marlin, DO Primary Cardiologist:  Parke Poisson, MD  Patient Profile     Alisha Terrell 83 year old female presents the clinic today for follow-up evaluation of her acute on chronic CHF.  Past Medical History    Past Medical History:  Diagnosis Date   ARF (acute renal failure) (HCC)    2020  resolved  it was presrnt when she was septic   Arthritis    Asthma    CHF (congestive heart failure) (HCC)    Chronic back pain    Class 2 obesity due to excess calories with body mass index (BMI) of 35.0 to 35.9 in adult    Dyspnea    ocassional   GERD (gastroesophageal reflux disease)    Headache    Hypertension    OSA (obstructive sleep apnea)    PAF (paroxysmal atrial fibrillation) (HCC)    TIA (transient ischemic attack) 2018   no resedule   Past Surgical History:  Procedure Laterality Date   ABDOMINAL SURGERY     CESAREAN SECTION     CHOLECYSTECTOMY     LESION EXCISION WITH COMPLEX REPAIR Left 07/25/2022   Procedure: LESION EXCISION OF LEFT BUCCAL MUCOSA;  Surgeon: Laren Boom, DO;  Location: [facility] OR;  Service: ENT;  Laterality: Left;    Allergies  Allergies  Allergen Reactions   Codeine Rash and Other (See Comments)    Agitation, bad dreams   Omnicef [Cefdinir] Nausea And Vomiting    Confirmed with patient at bedside that no rash - just had significant GI effects    History of Present Illness     Alisha Terrell has a PMH of paroxysmal atrial fibrillation, HTN, TIA, asthma, GERD, OSA on CPAP, and obesity.  She was admitted from the office 11/30/23 for acute on chronic CHF.  She presented with significant lower extremity edema, weight gain, and worsening shortness of breath.  She was noted to have orthopnea and PND.  Her chest x-ray showed mild pulmonary edema with scattered atelectasis.  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  She received IV diuresis and her discharge weight was noted to be 165 pounds (down from 180 pounds on admission).  She was discharged on torsemide 20 mg daily along with potassium 20 mill equivalents daily.  She was also started on spironolactone 25 mg and Farxiga 10 mg daily.  She presents the clinic today for follow-up evaluation and states she is feeling much better since being discharged from the hospital.  We reviewed her hospitalization.  She expressed understanding.  She has been seen by her PCP in follow-up.  They discontinued her spironolactone.  She will have repeat labs on Friday.  Her blood pressure at home has been in the 160s over 80s.  Here in the clinic today her blood pressure is initially 159/78 and on recheck is 148/68.  I will increase her metoprolol to 200 mg daily.  I will give her the salty 6 diet sheet, have her maintain her physical activity as tolerated and plan follow-up in 2 to 3 months.  I will have her repeat BMP at her PCP on Friday and fax labs to [place]..  Today she denies chest pain, shortness of breath,  lower extremity edema, fatigue, palpitations, melena, hematuria, hemoptysis, diaphoresis, weakness, presyncope, syncope, orthopnea, and PND.   Home Medications    Prior to Admission medications   Medication Sig Start Date End Date Taking? Authorizing Provider  acetaminophen (TYLENOL) 500 MG tablet Take 500 mg by mouth every 6 (six) hours as needed. Two Tablets Every 6 hours as Needed For Pain    [provider]  ADVAIR DISKUS 250-50 MCG/DOSE AEPB Inhale 1 puff into the lungs 2 (two) times daily.  08/22/15   [provider]  amiodarone (PACERONE) 200 MG tablet Take 1 tablet (200 mg total) by mouth daily. 05/18/23   Parke Poisson, MD  apixaban (ELIQUIS) 5 MG TABS tablet Take 1 tablet (5 mg total) by  mouth 2 (two) times daily. 08/07/23   Jodelle Gross, NP  Azelastine HCl 137 MCG/SPRAY SOLN Place 1 spray into both nostrils daily. 03/12/22   [provider]  calcium carbonate (TUMS - DOSED IN MG ELEMENTAL CALCIUM) 500 MG chewable tablet Chew 1 tablet by mouth daily as needed for indigestion or heartburn.    [provider]  dapagliflozin propanediol (FARXIGA) 10 MG TABS tablet Take 1 tablet (10 mg total) by mouth daily. 12/04/23   Corrin Parker, PA-C  Dextromethorphan-Guaifenesin (ROBITUSSIN COUGH/CHEST DM MAX PO) Take 1 mL by mouth 2 (two) times daily as needed (for cold symptoms).     [provider]  diclofenac Sodium (VOLTAREN) 1 % GEL Apply 2 g topically at bedtime. 08/16/19   [provider]  diltiazem (CARDIZEM CD) 300 MG 24 hr capsule Take 1 capsule by mouth daily. 09/14/23   [provider]  fluticasone (FLONASE) 50 MCG/ACT nasal spray Place 1 spray into both nostrils at bedtime.  09/26/15   [provider]  ibandronate (BONIVA) 150 MG tablet Take 150 mg by mouth every 30 (thirty) days.  08/16/19   [provider]  lidocaine (LMX) 4 % cream Apply 1 Application topically as needed (back pain).    [provider]  losartan (COZAAR) 100 MG tablet TAKE 1 TABLET BY MOUTH DAILY 01/05/23   Parke Poisson, MD  melatonin 5 MG TABS Take 5 mg by mouth at bedtime.    [provider]  metoprolol succinate (TOPROL-XL) 100 MG 24 hr tablet Take 1 tablet (100 mg total) by mouth daily. Take with or immediately following a meal. 12/04/23   Marjie Skiff E, PA-C  montelukast (SINGULAIR) 10 MG tablet Take 10 mg by mouth at bedtime. 09/10/15   [provider]  Multiple Vitamins-Minerals (CENTRUM SILVER ADULT 50+ PO) Take 1 tablet by mouth in the morning.    [provider]  omeprazole (PRILOSEC) 40 MG capsule Take 40 mg by mouth in the morning and at bedtime.  09/10/15   [provider]   ondansetron (ZOFRAN-ODT) 4 MG disintegrating tablet Take 4 mg by mouth every 8 (eight) hours as needed for vomiting or nausea. Patient not taking: Reported on 11/30/2023 03/22/21   [provider]  potassium chloride SA (KLOR-CON M) 20 MEQ tablet Take 1 tablet (20 mEq total) by mouth daily. 12/04/23   Corrin Parker, PA-C  PROAIR HFA 108 [phone] Base) MCG/ACT inhaler Inhale 1-2 puffs into the lungs at bedtime. 10/05/15   [provider]  spironolactone (ALDACTONE) 25 MG tablet Take 1 tablet (25 mg total) by mouth daily. 12/04/23   Corrin Parker, PA-C  torsemide (DEMADEX) 20 MG tablet Take 1 tablet (20 mg total) by mouth  daily. 12/04/23   Corrin Parker, PA-C  traMADol (ULTRAM) 50 MG tablet Take 50 mg by mouth in the morning and at bedtime. 03/23/19   [provider]    Family History    Family History  Problem Relation Age of Onset   Hypertension Mother 12   Pneumonia Father 41   She indicated that her mother is deceased. She indicated that her father is deceased.  Social History    Social History   Socioeconomic History   Marital status: Widowed    Spouse name: Ronni Rumble   Number of children: 2   Years of education: Not on file   Highest education level: Not on file  Occupational History   Occupation: retired  Tobacco Use   Smoking status: Never    Passive exposure: Yes   Smokeless tobacco: Never  Vaping Use   Vaping status: Never Used  Substance and Sexual Activity   Alcohol use: Yes    Alcohol/week: 3.0 standard drinks of alcohol    Types: 3 Glasses of wine per week    Comment: rare   Drug use: No   Sexual activity: Not on file  Other Topics Concern   Not on file  Social History Narrative   Patient is right-handed. She lives with her husband in a one level home. She does not exercise.   Social Drivers of Health   Financial Resource Strain: Medium Risk (08/15/2021)   Received from [facility] visits prior to  12/13/2022., [facility] visits prior to 12/13/2022.   Overall Financial Resource Strain (CARDIA)    Difficulty of Paying Living Expenses: Somewhat hard  Food Insecurity: Low Risk  (12/17/2023)   [facility] from [facility]   Hunger Vital Sign    Worried About Running Out of Food in the Last Year: Never true    Ran Out of Food in the Last Year: Never true  Transportation Needs: No Transportation Needs (12/17/2023)   Received from [facility]    In the past 12 months, has lack of reliable transportation kept you from medical appointments, meetings, work or from getting things needed for daily living? : No  Physical Activity: Insufficiently Active (08/15/2021)   [facility] prior to 12/13/2022., [facility] visits prior to 12/13/2022.   Exercise Vital Sign    Days of Exercise per Week: 2 days    Minutes of Exercise per Session: 20 min  Stress: No Stress Concern Present (08/15/2021)   [facility] prior to 12/13/2022., [facility] visits prior to 12/13/2022.   [employer] of Occupational Health - Occupational Stress Questionnaire    Feeling of Stress : Not at all  Social Connections: Patient Declined (12/09/2023)   Social Connection and Isolation Panel [NHANES]    Frequency of Communication with Friends and Family: Patient declined    Frequency of Social Gatherings with Friends and Family: Patient declined    Attends Religious Services: Patient declined    Database administrator or Organizations: Patient declined    Attends Banker Meetings: Patient declined    Marital Status: Patient declined  Intimate Partner Violence: Not At Risk (12/09/2023)   Humiliation, Afraid, Rape, and Kick questionnaire    Fear of Current or Ex-Partner: No    Emotionally Abused: No    Physically Abused: No    Sexually Abused: No  Review  of Systems    General:  No chills, fever, night sweats or weight changes.  Cardiovascular:  No chest pain, dyspnea on exertion, edema, orthopnea, palpitations, paroxysmal nocturnal dyspnea. Dermatological: No rash, lesions/masses Respiratory: No cough, dyspnea Urologic: No hematuria, dysuria Abdominal:   No nausea, vomiting, diarrhea, bright red blood per rectum, melena, or hematemesis Neurologic:  No visual changes, wkns, changes in mental status. All other systems reviewed and are otherwise negative except as noted above.  Physical Exam    VS:  BP (!) 148/68   Pulse 76   Ht 4\' 9"  (1.448 m)   Wt 164 lb 3.2 oz (74.5 kg)   SpO2 99%   BMI 35.53 kg/m  , BMI Body mass index is 35.53 kg/m. GEN: Well nourished, well developed, in no acute distress. HEENT: normal. Neck: Supple, no JVD, carotid bruits, or masses. Cardiac: RRR, no murmurs, rubs, or gallops. No clubbing, cyanosis, edema.  Radials/DP/PT 2+ and equal bilaterally.  Respiratory:  Respirations regular and unlabored, clear to auscultation bilaterally. GI: Soft, nontender, nondistended, BS + x 4. MS: no deformity or atrophy. Skin: warm and dry, no rash. Neuro:  Strength and sensation are intact. Psych: Normal affect.  Accessory Clinical Findings    Recent Labs: 11/30/2023: B Natriuretic Peptide 237.4; TSH 2.773 12/09/2023: ALT 20; BUN 21; Creatinine, Ser 1.10; Hemoglobin 12.0; Magnesium 2.2; Platelets 349; Potassium 4.5; Sodium 132   Recent Lipid Panel    Component Value Date/Time   CHOL 134 11/14/2015 1004   TRIG 24.0 11/14/2015 1004   HDL 78.70 11/14/2015 1004   CHOLHDL 2 11/14/2015 1004   VLDL 4.8 11/14/2015 1004   LDLCALC 51 11/14/2015 1004    HYPERTENSION CONTROL Vitals:   12/30/23 1410 12/30/23 1454  BP: (!) 159/78 (!) 148/68    The patient's blood pressure is elevated above target today.  In order to address the patient's elevated BP: Blood pressure will be monitored at home to determine if medication  changes need to be made.; A current anti-hypertensive medication was adjusted today.       ECG personally reviewed by me today-none today.    Echocardiogram 12/01/2023  IMPRESSIONS     1. Left ventricular ejection fraction, by estimation, is 55 to 60%. The  left ventricle has normal function. The left ventricle has no regional  wall motion abnormalities. There is mild concentric left ventricular  hypertrophy. Left ventricular diastolic  parameters are indeterminate.   2. Right ventricular systolic function is normal. The right ventricular  size is severely enlarged. Tricuspid regurgitation signal is inadequate  for assessing PA pressure.   3. Left atrial size was mild to moderately dilated.   4. The mitral valve is grossly normal. Mild mitral valve regurgitation.  No evidence of mitral stenosis.   5. The aortic valve is tricuspid. Aortic valve regurgitation is not  visualized. No aortic stenosis is present.   6. Aortic dilatation noted. There is mild dilatation of the ascending  aorta, measuring 40 mm.   7. The inferior vena cava is dilated in size with >50% respiratory  variability, suggesting right atrial pressure of 8 mmHg.   Comparison(s): Aortic dimensions increased from prior.   FINDINGS   Left Ventricle: Left ventricular ejection fraction, by estimation, is 55  to 60%. The left ventricle has normal function. The left ventricle has no  regional wall motion abnormalities. Strain imaging was not performed. The  left ventricular internal cavity   size was normal in size. There is mild concentric left  ventricular  hypertrophy. Left ventricular diastolic parameters are indeterminate.   Right Ventricle: The right ventricular size is severely enlarged. No  increase in right ventricular wall thickness. Right ventricular systolic  function is normal. Tricuspid regurgitation signal is inadequate for  assessing PA pressure.   Left Atrium: Left atrial size was mild to moderately  dilated.   Right Atrium: Right atrial size was normal in size.   Pericardium: There is no evidence of pericardial effusion.   Mitral Valve: The mitral valve is grossly normal. Mild mitral valve  regurgitation. No evidence of mitral valve stenosis.   Tricuspid Valve: The tricuspid valve is normal in structure. Tricuspid  valve regurgitation is not demonstrated.   Aortic Valve: The aortic valve is tricuspid. Aortic valve regurgitation is  not visualized. No aortic stenosis is present.   Pulmonic Valve: The pulmonic valve was normal in structure. Pulmonic valve  regurgitation is mild. No evidence of pulmonic stenosis.   Aorta: Aortic dilatation noted. There is mild dilatation of the ascending  aorta, measuring 40 mm.   Venous: The inferior vena cava is dilated in size with greater than 50%  respiratory variability, suggesting right atrial pressure of 8 mmHg.   IAS/Shunts: The atrial septum is grossly normal.       Assessment & Plan   1.  Acute on chronic diastolic CHF-weight today 164.  Has returned to normal daily activities.  Denies increased shortness of breath and activity intolerance.  Reports compliance with her medications. Heart healthy low-sodium diet-salty 6 diet sheet given Daily weights-weight log Elevate lower extremities when not active Lower extremity support stockings Continue torsemide, potassium, spironolactone, Farxiga Ordered BMP  Paroxysmal atrial fibrillation-heart rate today 76.  Denies episodes of accelerated or irregular heartbeat.  Reports compliance with apixaban.  Denies bleeding issues. Continue metoprolol, amiodarone, Cardizem, Eliquis  Essential hypertension-BP today 148/68. Maintain blood pressure log Continue  losartan, Cardizem Increase metoprolol to 200 mg daily Low-sodium diet  CKD stage III-creatinine 2.04 on 12/07/2023.  Avoid nephrotoxic agents Follows with PCP  Disposition: Follow-up with Dr. Jacques Navy or me in 2-3  months.   Thomasene Ripple. [place] NP-C     12/30/2023, 2:54 PM [facility] [address] Office [phone] Fax [phone]    I spent 14 minutes examining this patient, reviewing medications, and using patient centered shared decision making involving their cardiac care.   I spent  20 minutes reviewing past medical history,  medications, and prior cardiac tests.

## 2023-12-30 ENCOUNTER — Ambulatory Visit: Payer: Medicare HMO | Attending: General Practice | Admitting: General Practice

## 2023-12-30 ENCOUNTER — Encounter: Payer: Self-pay | Admitting: General Practice

## 2023-12-30 ENCOUNTER — Other Ambulatory Visit (HOSPITAL_COMMUNITY): Payer: Self-pay

## 2023-12-30 VITALS — BP 148/68 | HR 76 | Ht <= 58 in | Wt 164.2 lb

## 2023-12-30 DIAGNOSIS — I480 Paroxysmal atrial fibrillation: Secondary | ICD-10-CM

## 2023-12-30 DIAGNOSIS — I5043 Acute on chronic combined systolic (congestive) and diastolic (congestive) heart failure: Secondary | ICD-10-CM

## 2023-12-30 DIAGNOSIS — I10 Essential (primary) hypertension: Secondary | ICD-10-CM | POA: Diagnosis not present

## 2023-12-30 DIAGNOSIS — G4733 Obstructive sleep apnea (adult) (pediatric): Secondary | ICD-10-CM | POA: Diagnosis not present

## 2023-12-30 MED ORDER — METOPROLOL SUCCINATE ER 200 MG PO TB24: 200.0000 mg | ORAL_TABLET | Freq: Every day | ORAL | 3 refills | Status: DC

## 2023-12-30 MED ORDER — METOPROLOL SUCCINATE ER 200 MG PO TB24
200.0000 mg | ORAL_TABLET | Freq: Every day | ORAL | 3 refills | Status: DC
Start: 2023-12-30 — End: 2023-12-30
  Filled 2023-12-30: qty 30, 30d supply, fill #0

## 2023-12-30 NOTE — Addendum Note (Signed)
 Addended by: Alyson Ingles on: 12/30/2023 03:55 PM   Modules accepted: Orders

## 2023-12-30 NOTE — Patient Instructions (Signed)
 Medication Instructions:  INCREASE METOPROLOL 200MG  DAILY *If you need a refill on your cardiac medications before your next appointment, please call your pharmacy*  Lab Work: WE WILL GET LABS FROM PRIMARY MD If you have labs (blood work) drawn today and your tests are completely normal, you will receive your results only by: MyChart Message (if you have MyChart) OR A paper copy in the mail If you have any lab test that is abnormal or we need to change your treatment, we will call you to review the results.  Testing/Procedures: NONE  Follow-Up: At [facility], you and your health needs are our priority.  As part of our continuing mission to provide you with exceptional heart care, we have created designated Provider Care Teams.  These Care Teams include your primary Cardiologist (physician) and Advanced Practice Providers (APPs -  Physician Assistants and Nurse Practitioners) who all work together to provide you with the care you need, when you need it.  Your next appointment:   2-3 month(s)  Provider:   Edd Fabian, FNP-C     Other Instructions MAINTAIN PHYSICAL ACTIVITY PLEASE READ AND FOLLOW ATTACHED  SALTY 6

## 2023-12-30 NOTE — Addendum Note (Signed)
 Addended by: Alyson Ingles on: 12/30/2023 03:01 PM   Modules accepted: Orders

## 2024-01-12 ENCOUNTER — Other Ambulatory Visit: Payer: Self-pay

## 2024-01-12 MED ORDER — LOSARTAN POTASSIUM 100 MG PO TABS: 100.0000 mg | ORAL_TABLET | Freq: Every day | ORAL | 3 refills | Status: DC

## 2024-01-25 ENCOUNTER — Other Ambulatory Visit: Payer: Self-pay | Admitting: Internal Medicine

## 2024-01-27 ENCOUNTER — Other Ambulatory Visit: Payer: Self-pay

## 2024-01-27 MED ORDER — APIXABAN 5 MG PO TABS: 5.0000 mg | ORAL_TABLET | Freq: Two times a day (BID) | ORAL | 1 refills | Status: DC

## 2024-01-27 NOTE — Telephone Encounter (Signed)
 Prescription refill request for Eliquis received. Indication:afib Last office visit:3/25 Scr:0.99  3/25 Age: 83 Weight:74.5  kg  Prescription refilled

## 2024-02-18 ENCOUNTER — Other Ambulatory Visit: Payer: Self-pay | Admitting: Internal Medicine

## 2024-02-22 ENCOUNTER — Other Ambulatory Visit: Payer: Self-pay | Admitting: *Deleted

## 2024-02-22 MED ORDER — AMIODARONE HCL 200 MG PO TABS: 200.0000 mg | ORAL_TABLET | Freq: Every day | ORAL | 3 refills | Status: AC

## 2024-02-28 NOTE — Progress Notes (Signed)
 Cardiology Clinic Note   Patient Name: Alisha Terrell Date of Encounter: 03/01/2024  Primary Care Provider:  Lazoff, Shawn P, DO Primary Cardiologist:  Gayatri A Acharya, MD  Patient Profile     Alisha Terrell 83 year old female presents the clinic today for follow-up evaluation of her acute on chronic CHF.  Past Medical History    Past Medical History:  Diagnosis Date   ARF (acute renal failure) (HCC)    2020  resolved  it was presrnt when she was septic   Arthritis    Asthma    CHF (congestive heart failure) (HCC)    Chronic back pain    Class 2 obesity due to excess calories with body mass index (BMI) of 35.0 to 35.9 in adult    Dyspnea    ocassional   GERD (gastroesophageal reflux disease)    Headache    Hypertension    OSA (obstructive sleep apnea)    PAF (paroxysmal atrial fibrillation) (HCC)    TIA (transient ischemic attack) 2018   no resedule   Past Surgical History:  Procedure Laterality Date   ABDOMINAL SURGERY     CESAREAN SECTION     CHOLECYSTECTOMY     LESION EXCISION WITH COMPLEX REPAIR Left 07/25/2022   Procedure: LESION EXCISION OF LEFT BUCCAL MUCOSA;  Surgeon: Daleen Dubs, DO;  Location: [facility] OR;  Service: ENT;  Laterality: Left;    Allergies  Allergies  Allergen Reactions   Codeine Rash and Other (See Comments)    Agitation, bad dreams   Omnicef [Cefdinir] Nausea And Vomiting    Confirmed with patient at bedside that no rash - just had significant GI effects    History of Present Illness     Alisha Terrell has a PMH of paroxysmal atrial fibrillation, HTN, TIA, asthma, GERD, OSA on CPAP, and obesity.  She was admitted from the office 11/30/23 for acute on chronic CHF.  She presented with significant lower extremity edema, weight gain, and worsening shortness of breath.  She was noted to have orthopnea and PND.  Her chest x-ray showed mild pulmonary edema with scattered atelectasis.  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  She received IV diuresis and her discharge weight was noted to be 165 pounds (down from 180 pounds on admission).  She was discharged on torsemide  20 mg daily along with potassium 20 mill equivalents daily.  She was also started on spironolactone  25 mg and Farxiga  10 mg daily.  She presented the clinic 12/30/23 for follow-up evaluation and stated she was feeling much better since being discharged from the hospital.  We reviewed her hospitalization.  She expressed understanding.  She had been seen by her PCP in follow-up.  They discontinued her spironolactone .  She had repeat labs planned on Friday.  Her blood pressure at home had been in the 160s over 80s.  In the clinic  her blood pressure was initially 159/78 and on recheck was 148/68.  I increased  her metoprolol  to 200 mg daily.  I gave her the salty 6 diet sheet, asked her to maintain her physical activity and  planned follow-up in 2 to 3 months.    She presents to the clinic today for follow-up evaluation and states she had a cortisone injection in her shoulder last Friday.  Since that time  she has felt a little bit off.  "Feeling bad".  She does note that she is a little bit weaker.  She is breathing better today.  She does note that she had some shortness of breath with her CPAP last night.  She took it off and was able to sleep in a chair.  She is euvolemic today.  She appears well compensated.  She notes that she has feeling better.  She does have some white phlegm when she is coughing.  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  Her blood pressure is better controlled today at 136/70.  She also notes occasional brief episodes of dizziness.  I will plan follow-up in 6 months.  Today she denies chest pain, shortness of breath, lower extremity edema, fatigue, palpitations, melena, hematuria,  hemoptysis, diaphoresis, weakness, presyncope, syncope, orthopnea, and PND.   Home Medications    Prior to Admission medications   Medication Sig Start Date End Date Taking? Authorizing Provider  acetaminophen  (TYLENOL ) 500 MG tablet Take 500 mg by mouth every 6 (six) hours as needed. Two Tablets Every 6 hours as Needed For Pain    [provider]  ADVAIR DISKUS 250-50 MCG/DOSE AEPB Inhale 1 puff into the lungs 2 (two) times daily.  08/22/15   [provider]  amiodarone  (PACERONE ) 200 MG tablet Take 1 tablet (200 mg total) by mouth daily. 05/18/23   Acharya, Gayatri A, MD  apixaban  (ELIQUIS ) 5 MG TABS tablet Take 1 tablet (5 mg total) by mouth 2 (two) times daily. 08/07/23   Tania Familia, NP  Azelastine  HCl 137 MCG/SPRAY SOLN Place 1 spray into both nostrils daily. 03/12/22   [provider]  calcium  carbonate (TUMS - DOSED IN MG ELEMENTAL CALCIUM ) 500 MG chewable tablet Chew 1 tablet by mouth daily as needed for indigestion or heartburn.    [provider]  dapagliflozin  propanediol (FARXIGA ) 10 MG TABS tablet Take 1 tablet (10 mg total) by mouth daily. 12/04/23   Goodrich, Callie E, PA-C  Dextromethorphan -Guaifenesin  (ROBITUSSIN COUGH/CHEST DM MAX PO) Take 1 mL by mouth 2 (two) times daily as needed (for cold symptoms).     [provider]  diclofenac  Sodium (VOLTAREN ) 1 % GEL Apply 2 g topically at bedtime. 08/16/19   [provider]  diltiazem  (CARDIZEM  CD) 300 MG 24 hr capsule Take 1 capsule by mouth daily. 09/14/23   [provider]  fluticasone  (FLONASE ) 50 MCG/ACT nasal spray Place 1 spray into both nostrils at bedtime.  09/26/15   [provider]  ibandronate  (BONIVA ) 150 MG tablet Take 150 mg by mouth every 30 (thirty) days.  08/16/19   [provider]  lidocaine  (LMX) 4 % cream Apply 1 Application topically as needed (back pain).    [provider]  losartan  (COZAAR ) 100 MG tablet TAKE 1 TABLET BY  MOUTH DAILY 01/05/23   Acharya, Gayatri A, MD  melatonin 5 MG TABS Take 5 mg by mouth at bedtime.    [provider]  metoprolol  succinate (TOPROL -XL) 100 MG 24 hr tablet Take 1 tablet (100 mg total) by mouth daily. Take with or immediately following a meal. 12/04/23   Goodrich, Callie E, PA-C  montelukast  (SINGULAIR ) 10 MG tablet Take 10 mg by mouth at bedtime. 09/10/15   [provider]  Multiple Vitamins-Minerals (CENTRUM SILVER ADULT 50+ PO) Take 1 tablet by mouth in the morning.    [provider]  omeprazole (PRILOSEC) 40 MG capsule Take 40 mg by mouth in the  morning and at bedtime.  09/10/15   [provider]  ondansetron  (ZOFRAN -ODT) 4 MG disintegrating tablet Take 4 mg by mouth every 8 (eight) hours as needed for vomiting or nausea. Patient not taking: Reported on 11/30/2023 03/22/21   [provider]  potassium chloride  SA (KLOR-CON  M) 20 MEQ tablet Take 1 tablet (20 mEq total) by mouth daily. 12/04/23   Goodrich, Callie E, PA-C  PROAIR  HFA 108 (90 Base) MCG/ACT inhaler Inhale 1-2 puffs into the lungs at bedtime. 10/05/15   [provider]  spironolactone  (ALDACTONE ) 25 MG tablet Take 1 tablet (25 mg total) by mouth daily. 12/04/23   Goodrich, Callie E, PA-C  torsemide  (DEMADEX ) 20 MG tablet Take 1 tablet (20 mg total) by mouth daily. 12/04/23   Goodrich, Callie E, PA-C  traMADol  (ULTRAM ) 50 MG tablet Take 50 mg by mouth in the morning and at bedtime. 03/23/19   [provider]    Family History    Family History  Problem Relation Age of Onset   Hypertension Mother 37   Pneumonia Father 64   She indicated that her mother is deceased. She indicated that her father is deceased.  Social History    Social History   Socioeconomic History   Marital status: Widowed    Spouse name: Edwardo Graft   Number of children: 2   Years of education: Not on file   Highest education level: Not on file  Occupational History   Occupation:  retired  Tobacco Use   Smoking status: Never    Passive exposure: Yes   Smokeless tobacco: Never  Vaping Use   Vaping status: Never Used  Substance and Sexual Activity   Alcohol use: Yes    Alcohol/week: 3.0 standard drinks of alcohol    Types: 3 Glasses of wine per week    Comment: rare   Drug use: No   Sexual activity: Not on file  Other Topics Concern   Not on file  Social History Narrative   Patient is right-handed. She lives with her husband in a one level home. She does not exercise.   Social Drivers of Health   Financial Resource Strain: Medium Risk (08/15/2021)   Received from [facility] visits prior to 12/13/2022., [facility] visits prior to 12/13/2022.   Overall Financial Resource Strain (CARDIA)    Difficulty of Paying Living Expenses: Somewhat hard  Food Insecurity: Low Risk  (12/17/2023)   [facility]   Hunger Vital Sign    Worried About Running Out of Food in the Last Year: Never true    Ran Out of Food in the Last Year: Never true  Transportation Needs: No Transportation Needs (12/17/2023)   Received from [employer]    In the past 12 months, has lack of reliable transportation kept you from medical appointments, meetings, work or from getting things needed for daily living? : No  Physical Activity: Insufficiently Active (08/15/2021)   Received from [facility] visits prior to 12/13/2022., [facility] visits prior to 12/13/2022.   Exercise Vital Sign    Days of Exercise per Week: 2 days    Minutes of Exercise per Session: 20 min  Stress: No Stress Concern Present (08/15/2021)   [facility] prior to 12/13/2022., [facility] visits prior to 12/13/2022.   [employer] of Occupational Health - Occupational Stress Questionnaire  Feeling of Stress : Not at all  Social Connections: Patient  Declined (12/09/2023)   Social Connection and Isolation Panel [NHANES]    Frequency of Communication with Friends and Family: Patient declined    Frequency of Social Gatherings with Friends and Family: Patient declined    Attends Religious Services: Patient declined    Database administrator or Organizations: Patient declined    Attends Banker Meetings: Patient declined    Marital Status: Patient declined  Intimate Partner Violence: Not At Risk (12/09/2023)   Humiliation, Afraid, Rape, and Kick questionnaire    Fear of Current or Ex-Partner: No    Emotionally Abused: No    Physically Abused: No    Sexually Abused: No     Review of Systems    General:  No chills, fever, night sweats or weight changes.  Cardiovascular:  No chest pain, dyspnea on exertion, edema, orthopnea, palpitations, paroxysmal nocturnal dyspnea. Dermatological: No rash, lesions/masses Respiratory: No cough, dyspnea Urologic: No hematuria, dysuria Abdominal:   No nausea, vomiting, diarrhea, bright red blood per rectum, melena, or hematemesis Neurologic:  No visual changes, wkns, changes in mental status. All other systems reviewed and are otherwise negative except as noted above.  Physical Exam    VS:  BP 136/70 (BP Location: Right Arm, Patient Position: Sitting, Cuff Size: Normal)   Pulse 64   Ht 4\' 8"  (1.422 m)   Wt 167 lb (75.8 kg)   BMI 37.44 kg/m  , BMI Body mass index is 37.44 kg/m. GEN: Well nourished, well developed, in no acute distress. HEENT: normal. Neck: Supple, no JVD, carotid bruits, or masses. Cardiac: RRR, no murmurs, rubs, or gallops. No clubbing, cyanosis, left greater than right generalized nonpitting edema.  Radials/DP/PT 2+ and equal bilaterally.  Respiratory:  Respirations regular and unlabored, clear to auscultation bilaterally. GI: Soft, nontender, nondistended, BS + x 4. MS: no deformity or atrophy. Skin: warm and dry, no rash. Neuro:  Strength and sensation are  intact. Psych: Normal affect.  Accessory Clinical Findings    Recent Labs: 11/30/2023: B Natriuretic Peptide 237.4; TSH 2.773 12/09/2023: ALT 20; BUN 21; Creatinine, Ser 1.10; Hemoglobin 12.0; Magnesium  2.2; Platelets 349; Potassium 4.5; Sodium 132   Recent Lipid Panel    Component Value Date/Time   CHOL 134 11/14/2015 1004   TRIG 24.0 11/14/2015 1004   HDL 78.70 11/14/2015 1004   CHOLHDL 2 11/14/2015 1004   VLDL 4.8 11/14/2015 1004   LDLCALC 51 11/14/2015 1004         ECG personally reviewed by me today-none today.  Echocardiogram 12/01/2023  IMPRESSIONS     1. Left ventricular ejection fraction, by estimation, is 55 to 60%. The  left ventricle has normal function. The left ventricle has no regional  wall motion abnormalities. There is mild concentric left ventricular  hypertrophy. Left ventricular diastolic  parameters are indeterminate.   2. Right ventricular systolic function is normal. The right ventricular  size is severely enlarged. Tricuspid regurgitation signal is inadequate  for assessing PA pressure.   3. Left atrial size was mild to moderately dilated.   4. The mitral valve is grossly normal. Mild mitral valve regurgitation.  No evidence of mitral stenosis.   5. The aortic valve is tricuspid. Aortic valve regurgitation is not  visualized. No aortic stenosis is present.   6. Aortic dilatation noted. There is mild dilatation of the ascending  aorta, measuring 40 mm.   7. The inferior vena cava is dilated in size with >  50% respiratory  variability, suggesting right atrial pressure of 8 mmHg.   Comparison(s): Aortic dimensions increased from prior.   FINDINGS   Left Ventricle: Left ventricular ejection fraction, by estimation, is 55  to 60%. The left ventricle has normal function. The left ventricle has no  regional wall motion abnormalities. Strain imaging was not performed. The  left ventricular internal cavity   size was normal in size. There is mild  concentric left ventricular  hypertrophy. Left ventricular diastolic parameters are indeterminate.   Right Ventricle: The right ventricular size is severely enlarged. No  increase in right ventricular wall thickness. Right ventricular systolic  function is normal. Tricuspid regurgitation signal is inadequate for  assessing PA pressure.   Left Atrium: Left atrial size was mild to moderately dilated.   Right Atrium: Right atrial size was normal in size.   Pericardium: There is no evidence of pericardial effusion.   Mitral Valve: The mitral valve is grossly normal. Mild mitral valve  regurgitation. No evidence of mitral valve stenosis.   Tricuspid Valve: The tricuspid valve is normal in structure. Tricuspid  valve regurgitation is not demonstrated.   Aortic Valve: The aortic valve is tricuspid. Aortic valve regurgitation is  not visualized. No aortic stenosis is present.   Pulmonic Valve: The pulmonic valve was normal in structure. Pulmonic valve  regurgitation is mild. No evidence of pulmonic stenosis.   Aorta: Aortic dilatation noted. There is mild dilatation of the ascending  aorta, measuring 40 mm.   Venous: The inferior vena cava is dilated in size with greater than 50%  respiratory variability, suggesting right atrial pressure of 8 mmHg.   IAS/Shunts: The atrial septum is grossly normal.       Assessment & Plan   1.  Essential hypertension-BP today 136/70. Maintain blood pressure log Continue  losartan , Cardizem  Continue metoprolol   Low-sodium diet  Acute on chronic diastolic CHF-weight today 167.   She is well compensated.   Heart healthy low-sodium diet-salty 6 diet sheet again reviewed Daily weights-weight log Elevate lower extremities when not active Continue torsemide , potassium, spironolactone , Farxiga   Paroxysmal atrial fibrillation-heart rate today 64.  Continues to deny episodes of accelerated or irregular heartbeat.  Reports compliance with apixaban .   Denies bleeding issues. Continue metoprolol , amiodarone , Cardizem , Eliquis    CKD stage III-creatinine 1.1 on 12/09/2023.  Avoid nephrotoxic agents Follows with PCP  Disposition: Follow-up with Dr. Acharya or me in 6 months.   Chet Cota. Jeramiah Mccaughey NP-C     03/01/2024, 2:13 PM [facility] [address] Office [phone] Fax [phone]    I spent 14 minutes examining this patient, reviewing medications, and using patient centered shared decision making involving their cardiac care.   I spent  20 minutes reviewing past medical history,  medications, and prior cardiac tests.

## 2024-03-01 ENCOUNTER — Ambulatory Visit: Attending: General Practice | Admitting: General Practice

## 2024-03-01 ENCOUNTER — Encounter: Payer: Self-pay | Admitting: General Practice

## 2024-03-01 VITALS — BP 136/70 | HR 64 | Ht <= 58 in | Wt 167.0 lb

## 2024-03-01 DIAGNOSIS — I10 Essential (primary) hypertension: Secondary | ICD-10-CM | POA: Diagnosis not present

## 2024-03-01 DIAGNOSIS — I480 Paroxysmal atrial fibrillation: Secondary | ICD-10-CM | POA: Diagnosis not present

## 2024-03-01 DIAGNOSIS — I5032 Chronic diastolic (congestive) heart failure: Secondary | ICD-10-CM

## 2024-03-01 DIAGNOSIS — R002 Palpitations: Secondary | ICD-10-CM

## 2024-03-01 DIAGNOSIS — Z79899 Other long term (current) drug therapy: Secondary | ICD-10-CM | POA: Diagnosis not present

## 2024-03-01 NOTE — Patient Instructions (Addendum)
 Medication Instructions:  NO CHANGES   Lab Work: NONE  Testing/Procedures: NONE  Follow-Up: At [facility], you and your health needs are our priority.  As part of our continuing mission to provide you with exceptional heart care, our providers are all part of one team.  This team includes your primary Cardiologist (physician) and Advanced Practice Providers or APPs (Physician Assistants and Nurse Practitioners) who all work together to provide you with the care you need, when you need it.  Your next appointment:   6 month(s)  Provider:   Euell Herrlich, MD OR Lawana Pray, FNP    Other Instructions SALTY 6 LITERATURE GIVEN AT TODAY'S VISIT.

## 2024-04-29 ENCOUNTER — Other Ambulatory Visit: Payer: Self-pay | Admitting: General Practice

## 2024-05-17 ENCOUNTER — Encounter: Payer: Self-pay | Admitting: Podiatry

## 2024-05-17 ENCOUNTER — Ambulatory Visit: Admitting: Podiatry

## 2024-05-17 DIAGNOSIS — Z7901 Long term (current) use of anticoagulants: Secondary | ICD-10-CM

## 2024-05-17 DIAGNOSIS — L84 Corns and callosities: Secondary | ICD-10-CM

## 2024-05-17 DIAGNOSIS — M79674 Pain in right toe(s): Secondary | ICD-10-CM

## 2024-05-17 DIAGNOSIS — M79675 Pain in left toe(s): Secondary | ICD-10-CM | POA: Diagnosis not present

## 2024-05-17 DIAGNOSIS — B351 Tinea unguium: Secondary | ICD-10-CM

## 2024-05-17 NOTE — Progress Notes (Signed)
 Subjective: Chief Complaint  Patient presents with   Nail Problem    Rm13 Patient complains of painful toenail left 2nd and requesting a nail trim.     83 y.o. returns the office today for thick, elongated toenails that she is not able to trim herself.  She states the left second toenail is quite thick and long causing discomfort.  She has a corn at the tip of the toe.  The right third toenail did come off.  She states that she hit her toes on her walker quite a bit.  She is on Eliquis   PCP: Dr. Shawn Lazoff, DO Lat seen 03/25/2023  Objective: AAO 3, NAD DP/PT pulses palpable, CRT less than 3 seconds.  Nails are hypertrophic, dystrophic, brittle, discolored, elongated 9. No surrounding redness or drainage. Tenderness nails 1-5 bilaterally, except the right third toe is there is no nail present.  No open lesions or pre-ulcerative lesions are identified today.  Incurvation of the hallux nail border.  There is no signs of infection or pain associated with ingrown toenail today. Hyperkeratotic lesion present to left second toe along the distal aspect without any underlying ulceration drainage or any signs of infection.  This has dried blood underneath it is preulcerative.  No underlying ulceration noted today. No pain with calf compression, swelling, warmth, erythema.  Assessment: Symptomatic onychosis preulcerative callus left second toe  Plan: Symptomatic onychomycosis -Sharply debrided the nails x10 without any complications or bleeding.  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  Continue offloading.  Series preulcerative and she is to monitor closely.  Return in about 9 weeks (around 07/02/2023) for routine care.  Alisha Terrell DPM

## 2024-07-19 ENCOUNTER — Ambulatory Visit: Admitting: Podiatry

## 2024-07-28 ENCOUNTER — Other Ambulatory Visit: Payer: Self-pay | Admitting: Internal Medicine

## 2024-08-02 ENCOUNTER — Other Ambulatory Visit: Payer: Self-pay

## 2024-08-02 MED ORDER — APIXABAN 5 MG PO TABS: 5.0000 mg | ORAL_TABLET | Freq: Two times a day (BID) | ORAL | 1 refills | Status: AC

## 2024-08-02 NOTE — Telephone Encounter (Signed)
 Prescription refill request for Eliquis  received. Indication:afib Last office visit:5/25 Scr:0.89  8/25 Age: 83 Weight:75.8  kg  Prescription refilled

## 2024-08-16 ENCOUNTER — Other Ambulatory Visit: Payer: Self-pay | Admitting: Internal Medicine

## 2024-08-30 ENCOUNTER — Ambulatory Visit: Admitting: Podiatry

## 2024-08-30 DIAGNOSIS — M79674 Pain in right toe(s): Secondary | ICD-10-CM

## 2024-08-30 DIAGNOSIS — Z7901 Long term (current) use of anticoagulants: Secondary | ICD-10-CM | POA: Diagnosis not present

## 2024-08-30 DIAGNOSIS — L84 Corns and callosities: Secondary | ICD-10-CM

## 2024-08-30 DIAGNOSIS — B351 Tinea unguium: Secondary | ICD-10-CM | POA: Diagnosis not present

## 2024-08-30 DIAGNOSIS — M79675 Pain in left toe(s): Secondary | ICD-10-CM | POA: Diagnosis not present

## 2024-08-30 NOTE — Progress Notes (Signed)
 Subjective: Chief Complaint  Patient presents with   RFC    Rm14 Routine foot care/ Pt says she is doing well with no concerns today.     83 y.o. returns the office today for thick, elongated toenails that she is not able to trim herself.  She states the left second toenail is quite thick and long causing discomfort.  She has a corn at the tip of the toe.  No other concerns today.  She is on Eliquis   PCP: Dr. Shawn Lazoff, DO Lat seen 03/25/2023  Objective: AAO 3, NAD DP/PT pulses palpable, CRT less than 3 seconds.  Nails are hypertrophic, dystrophic, brittle, discolored, elongated 9. No surrounding redness or drainage. Tenderness nails 1-5 bilaterally. Hyperkeratotic lesion present to left second toe along the distal aspect without any underlying ulceration drainage or any signs of infection.  This has dried blood underneath it is preulcerative.  No underlying ulceration noted today. No pain with calf compression, swelling, warmth, erythema.  Assessment: Symptomatic onychosis preulcerative callus left second toe  Plan: Symptomatic onychomycosis -Sharply debrided the nails x10 without any complications or bleeding.  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  Continue offloading.  Series preulcerative and she is to monitor closely.  Return in about 3 months (around 11/30/2024).   Donnice JONELLE Fees DPM

## 2024-10-10 ENCOUNTER — Inpatient Hospital Stay (HOSPITAL_COMMUNITY)
Admission: EM | Admit: 2024-10-10 | Discharge: 2024-10-14 | DRG: 291 | Disposition: A | Discharge status: Home Health | Attending: Internal Medicine | Admitting: Internal Medicine

## 2024-10-10 ENCOUNTER — Encounter (HOSPITAL_COMMUNITY): Payer: Self-pay

## 2024-10-10 ENCOUNTER — Other Ambulatory Visit: Payer: Self-pay

## 2024-10-10 ENCOUNTER — Telehealth: Payer: Self-pay | Admitting: Internal Medicine

## 2024-10-10 ENCOUNTER — Emergency Department (HOSPITAL_COMMUNITY)

## 2024-10-10 DIAGNOSIS — E669 Obesity, unspecified: Secondary | ICD-10-CM | POA: Diagnosis present

## 2024-10-10 DIAGNOSIS — Z7189 Other specified counseling: Secondary | ICD-10-CM

## 2024-10-10 DIAGNOSIS — M129 Arthropathy, unspecified: Secondary | ICD-10-CM | POA: Diagnosis present

## 2024-10-10 DIAGNOSIS — J45909 Unspecified asthma, uncomplicated: Secondary | ICD-10-CM | POA: Diagnosis present

## 2024-10-10 DIAGNOSIS — Z7901 Long term (current) use of anticoagulants: Secondary | ICD-10-CM

## 2024-10-10 DIAGNOSIS — I5023 Acute on chronic systolic (congestive) heart failure: Principal | ICD-10-CM

## 2024-10-10 DIAGNOSIS — D649 Anemia, unspecified: Secondary | ICD-10-CM | POA: Diagnosis present

## 2024-10-10 DIAGNOSIS — I493 Ventricular premature depolarization: Secondary | ICD-10-CM | POA: Diagnosis present

## 2024-10-10 DIAGNOSIS — Z885 Allergy status to narcotic agent status: Secondary | ICD-10-CM

## 2024-10-10 DIAGNOSIS — I5033 Acute on chronic diastolic (congestive) heart failure: Secondary | ICD-10-CM | POA: Diagnosis present

## 2024-10-10 DIAGNOSIS — Z8673 Personal history of transient ischemic attack (TIA), and cerebral infarction without residual deficits: Secondary | ICD-10-CM

## 2024-10-10 DIAGNOSIS — I440 Atrioventricular block, first degree: Secondary | ICD-10-CM | POA: Diagnosis present

## 2024-10-10 DIAGNOSIS — I480 Paroxysmal atrial fibrillation: Secondary | ICD-10-CM | POA: Diagnosis present

## 2024-10-10 DIAGNOSIS — I110 Hypertensive heart disease with heart failure: Principal | ICD-10-CM | POA: Diagnosis present

## 2024-10-10 DIAGNOSIS — Z79899 Other long term (current) drug therapy: Secondary | ICD-10-CM

## 2024-10-10 DIAGNOSIS — G4733 Obstructive sleep apnea (adult) (pediatric): Secondary | ICD-10-CM | POA: Diagnosis present

## 2024-10-10 DIAGNOSIS — I4510 Unspecified right bundle-branch block: Secondary | ICD-10-CM | POA: Diagnosis present

## 2024-10-10 DIAGNOSIS — I160 Hypertensive urgency: Secondary | ICD-10-CM | POA: Diagnosis present

## 2024-10-10 DIAGNOSIS — J449 Chronic obstructive pulmonary disease, unspecified: Secondary | ICD-10-CM | POA: Diagnosis present

## 2024-10-10 DIAGNOSIS — Z7951 Long term (current) use of inhaled steroids: Secondary | ICD-10-CM

## 2024-10-10 DIAGNOSIS — Z6837 Body mass index (BMI) 37.0-37.9, adult: Secondary | ICD-10-CM

## 2024-10-10 DIAGNOSIS — Z8249 Family history of ischemic heart disease and other diseases of the circulatory system: Secondary | ICD-10-CM

## 2024-10-10 DIAGNOSIS — G8929 Other chronic pain: Secondary | ICD-10-CM | POA: Diagnosis present

## 2024-10-10 DIAGNOSIS — J4489 Other specified chronic obstructive pulmonary disease: Secondary | ICD-10-CM | POA: Diagnosis present

## 2024-10-10 DIAGNOSIS — E66812 Obesity, class 2: Secondary | ICD-10-CM | POA: Diagnosis present

## 2024-10-10 DIAGNOSIS — Z9049 Acquired absence of other specified parts of digestive tract: Secondary | ICD-10-CM

## 2024-10-10 DIAGNOSIS — Z888 Allergy status to other drugs, medicaments and biological substances status: Secondary | ICD-10-CM

## 2024-10-10 DIAGNOSIS — M1990 Unspecified osteoarthritis, unspecified site: Secondary | ICD-10-CM | POA: Diagnosis present

## 2024-10-10 LAB — BASIC METABOLIC PANEL WITH GFR
Anion gap: 13 (ref 5–15)
BUN: 15 mg/dL (ref 8–23)
CO2: 23 mmol/L (ref 22–32)
Calcium: 9.3 mg/dL (ref 8.9–10.3)
Chloride: 100 mmol/L (ref 98–111)
Creatinine, Ser: 0.78 mg/dL (ref 0.44–1.00)
GFR, Estimated: >60 mL/min
Glucose, Bld: 126 mg/dL — ABNORMAL HIGH (ref 70–99)
Potassium: 4.2 mmol/L (ref 3.5–5.1)
Sodium: 136 mmol/L (ref 135–145)

## 2024-10-10 LAB — URINALYSIS, ROUTINE W REFLEX MICROSCOPIC
Bilirubin Urine: NEGATIVE
Glucose, UA: NEGATIVE mg/dL
Hgb urine dipstick: NEGATIVE
Ketones, ur: NEGATIVE mg/dL
Leukocytes,Ua: NEGATIVE
Nitrite: NEGATIVE
Protein, ur: NEGATIVE mg/dL
Specific Gravity, Urine: 1.006 (ref 1.005–1.030)
pH: 7 (ref 5.0–8.0)

## 2024-10-10 LAB — CBC
HCT: 34.1 % — ABNORMAL LOW (ref 36.0–46.0)
Hemoglobin: 11 g/dL — ABNORMAL LOW (ref 12.0–15.0)
MCH: 28 pg (ref 26.0–34.0)
MCHC: 32.3 g/dL (ref 30.0–36.0)
MCV: 86.8 fL (ref 80.0–100.0)
Platelets: 296 K/uL (ref 150–400)
RBC: 3.93 MIL/uL (ref 3.87–5.11)
RDW: 15.7 % — ABNORMAL HIGH (ref 11.5–15.5)
WBC: 9.3 K/uL (ref 4.0–10.5)
nRBC: 0 % (ref 0.0–0.2)

## 2024-10-10 LAB — PRO BRAIN NATRIURETIC PEPTIDE: Pro Brain Natriuretic Peptide: 4030 pg/mL — ABNORMAL HIGH

## 2024-10-10 NOTE — ED Triage Notes (Signed)
 Patient has been having shob for several days but it has gotten worse today.  She is also reporting bilateral leg swelling and abdomen swelling.   She states she has been urinating the same as she has been. She does have chest pressure in the center when she lays down.  Denies any recent illnesses  She has a history of afib and CHF.

## 2024-10-10 NOTE — ED Provider Triage Note (Signed)
 Emergency Medicine Provider Triage Evaluation Note  Alisha Terrell , a 83 y.o. female  was evaluated in triage.  Pt complains of sob. Progressive worsening SOB and legs swelling ongoing for more than a week.  Sxs felt similar to prior CHF exacerbation.  She was taken off her lasix  a while back.  No fever, chills, productive cough, cp, abd pain  Review of Systems  Positive: As above Negative: As above  Physical Exam  BP (!) 179/77 (BP Location: Left Arm)   Pulse 61   Temp 98.1 F (36.7 C)   Resp (!) 21   Ht 4' 8 (1.422 m)   Wt 75.3 kg   SpO2 95%   BMI 37.22 kg/m  Gen:   Awake, no distress   Resp:  Normal effort  MSK:   Moves extremities without difficulty  Other:  Peripheral edema  Medical Decision Making  Medically screening exam initiated at 7:17 PM.  Appropriate orders placed.  Alisha Terrell was informed that the remainder of the evaluation will be completed by another provider, this initial triage assessment does not replace that evaluation, and the importance of remaining in the ED until their evaluation is complete.     Nivia Colon, PA-C 10/10/24 [phone]

## 2024-10-10 NOTE — Telephone Encounter (Signed)
 Spoke with pt over the phone. Pt states she has been SOB for the past 2 weeks but it has been getting worse. Pt audibly short of breath over the phone. O2 89% originally but ended up getting to 97%, HR 61. No weight gain the last week- pt currently at 166.6 lbs. Gained a couples of lbs every few days previously and then would lose the weight. Pt states she gained no more than 2 lbs at one time. Bilateral ankle edema. Pt states she feels she has some abdominal edema as well. Torsemide  removed Feb 2025 at hospital DC as well as Lasix  per pt. Most recent Cr was 0.89 in August 2025. Explained to pt that we would send this information to Dr. Loni to review and we will be in touch with any recommendations.

## 2024-10-10 NOTE — Telephone Encounter (Signed)
 Spoke with pt and let her know that Dr. Kriste (DOD) had advised that pt be evaluated at ED for SOB and O2 being low when moving around. Pt verbalized understanding of plan and stated she would have her daughter take her.

## 2024-10-10 NOTE — Telephone Encounter (Signed)
 Pt c/o Shortness Of Breath: STAT if SOB developed within the last 24 hours or pt is noticeably SOB on the phone  1. Are you currently SOB (can you hear that pt is SOB on the phone)? No right now but earlier this morning   2. How long have you been experiencing SOB? Started 2 weeks ago and has slowly gotten worse   3. Are you SOB when sitting or when up moving around? Mostly when she moving around.    4. Are you currently experiencing any other symptoms? Sometimes maybe a tiny pressure in her chest.    Best number [phone]

## 2024-10-11 ENCOUNTER — Observation Stay (HOSPITAL_BASED_OUTPATIENT_CLINIC_OR_DEPARTMENT_OTHER)

## 2024-10-11 DIAGNOSIS — I480 Paroxysmal atrial fibrillation: Secondary | ICD-10-CM

## 2024-10-11 DIAGNOSIS — M129 Arthropathy, unspecified: Secondary | ICD-10-CM

## 2024-10-11 DIAGNOSIS — E66812 Obesity, class 2: Secondary | ICD-10-CM | POA: Diagnosis present

## 2024-10-11 DIAGNOSIS — I5033 Acute on chronic diastolic (congestive) heart failure: Secondary | ICD-10-CM

## 2024-10-11 DIAGNOSIS — Z6832 Body mass index (BMI) 32.0-32.9, adult: Secondary | ICD-10-CM | POA: Diagnosis not present

## 2024-10-11 DIAGNOSIS — I160 Hypertensive urgency: Secondary | ICD-10-CM | POA: Diagnosis present

## 2024-10-11 DIAGNOSIS — Z6837 Body mass index (BMI) 37.0-37.9, adult: Secondary | ICD-10-CM | POA: Diagnosis not present

## 2024-10-11 DIAGNOSIS — Z888 Allergy status to other drugs, medicaments and biological substances status: Secondary | ICD-10-CM | POA: Diagnosis not present

## 2024-10-11 DIAGNOSIS — I440 Atrioventricular block, first degree: Secondary | ICD-10-CM | POA: Diagnosis present

## 2024-10-11 DIAGNOSIS — Z79899 Other long term (current) drug therapy: Secondary | ICD-10-CM | POA: Diagnosis not present

## 2024-10-11 DIAGNOSIS — I493 Ventricular premature depolarization: Secondary | ICD-10-CM | POA: Diagnosis present

## 2024-10-11 DIAGNOSIS — Z8673 Personal history of transient ischemic attack (TIA), and cerebral infarction without residual deficits: Secondary | ICD-10-CM | POA: Diagnosis not present

## 2024-10-11 DIAGNOSIS — E66811 Obesity, class 1: Secondary | ICD-10-CM | POA: Diagnosis not present

## 2024-10-11 DIAGNOSIS — G4733 Obstructive sleep apnea (adult) (pediatric): Secondary | ICD-10-CM | POA: Diagnosis present

## 2024-10-11 DIAGNOSIS — J4489 Other specified chronic obstructive pulmonary disease: Secondary | ICD-10-CM | POA: Diagnosis present

## 2024-10-11 DIAGNOSIS — D649 Anemia, unspecified: Secondary | ICD-10-CM | POA: Diagnosis present

## 2024-10-11 DIAGNOSIS — Z9049 Acquired absence of other specified parts of digestive tract: Secondary | ICD-10-CM | POA: Diagnosis not present

## 2024-10-11 DIAGNOSIS — Z7189 Other specified counseling: Secondary | ICD-10-CM | POA: Diagnosis not present

## 2024-10-11 DIAGNOSIS — M1990 Unspecified osteoarthritis, unspecified site: Secondary | ICD-10-CM | POA: Diagnosis present

## 2024-10-11 DIAGNOSIS — Z885 Allergy status to narcotic agent status: Secondary | ICD-10-CM | POA: Diagnosis not present

## 2024-10-11 DIAGNOSIS — J45909 Unspecified asthma, uncomplicated: Secondary | ICD-10-CM | POA: Diagnosis present

## 2024-10-11 DIAGNOSIS — Z7951 Long term (current) use of inhaled steroids: Secondary | ICD-10-CM | POA: Diagnosis not present

## 2024-10-11 DIAGNOSIS — G8929 Other chronic pain: Secondary | ICD-10-CM | POA: Diagnosis present

## 2024-10-11 DIAGNOSIS — Z7901 Long term (current) use of anticoagulants: Secondary | ICD-10-CM | POA: Diagnosis not present

## 2024-10-11 DIAGNOSIS — I4510 Unspecified right bundle-branch block: Secondary | ICD-10-CM | POA: Diagnosis present

## 2024-10-11 DIAGNOSIS — Z8249 Family history of ischemic heart disease and other diseases of the circulatory system: Secondary | ICD-10-CM | POA: Diagnosis not present

## 2024-10-11 DIAGNOSIS — I10 Essential (primary) hypertension: Secondary | ICD-10-CM | POA: Diagnosis not present

## 2024-10-11 DIAGNOSIS — I110 Hypertensive heart disease with heart failure: Secondary | ICD-10-CM | POA: Diagnosis present

## 2024-10-11 LAB — CBC WITH DIFFERENTIAL/PLATELET
Abs Immature Granulocytes: 0.02 K/uL (ref 0.00–0.07)
Basophils Absolute: 0 K/uL (ref 0.0–0.1)
Basophils Relative: 1 %
Eosinophils Absolute: 0.2 K/uL (ref 0.0–0.5)
Eosinophils Relative: 2 %
HCT: 33.7 % — ABNORMAL LOW (ref 36.0–46.0)
Hemoglobin: 11 g/dL — ABNORMAL LOW (ref 12.0–15.0)
Immature Granulocytes: 0 %
Lymphocytes Relative: 9 %
Lymphs Abs: 0.8 K/uL (ref 0.7–4.0)
MCH: 28.1 pg (ref 26.0–34.0)
MCHC: 32.6 g/dL (ref 30.0–36.0)
MCV: 86 fL (ref 80.0–100.0)
Monocytes Absolute: 0.8 K/uL (ref 0.1–1.0)
Monocytes Relative: 10 %
Neutro Abs: 7 K/uL (ref 1.7–7.7)
Neutrophils Relative %: 78 %
Platelets: 255 K/uL (ref 150–400)
RBC: 3.92 MIL/uL (ref 3.87–5.11)
RDW: 15.8 % — ABNORMAL HIGH (ref 11.5–15.5)
WBC: 8.8 K/uL (ref 4.0–10.5)
nRBC: 0 % (ref 0.0–0.2)

## 2024-10-11 LAB — COMPREHENSIVE METABOLIC PANEL WITH GFR
ALT: 18 U/L (ref 0–44)
AST: 26 U/L (ref 15–41)
Albumin: 4.2 g/dL (ref 3.5–5.0)
Alkaline Phosphatase: 62 U/L (ref 38–126)
Anion gap: 12 (ref 5–15)
BUN: 11 mg/dL (ref 8–23)
CO2: 24 mmol/L (ref 22–32)
Calcium: 9.2 mg/dL (ref 8.9–10.3)
Chloride: 102 mmol/L (ref 98–111)
Creatinine, Ser: 0.7 mg/dL (ref 0.44–1.00)
GFR, Estimated: >60 mL/min
Glucose, Bld: 104 mg/dL — ABNORMAL HIGH (ref 70–99)
Potassium: 3.4 mmol/L — ABNORMAL LOW (ref 3.5–5.1)
Sodium: 138 mmol/L (ref 135–145)
Total Bilirubin: 0.6 mg/dL (ref 0.0–1.2)
Total Protein: 6.9 g/dL (ref 6.5–8.1)

## 2024-10-11 LAB — TSH: TSH: 1.51 u[IU]/mL (ref 0.350–4.500)

## 2024-10-11 LAB — ECHOCARDIOGRAM COMPLETE
Area-P 1/2: 3.21 cm2
Calc EF: 62.1 %
Height: 57 in
S' Lateral: 3.2 cm
Single Plane A2C EF: 57.2 %
Single Plane A4C EF: 63.6 %
Weight: 2504.43 [oz_av]

## 2024-10-11 LAB — MAGNESIUM: Magnesium: 2 mg/dL (ref 1.7–2.4)

## 2024-10-11 MED ORDER — MONTELUKAST SODIUM 10 MG PO TABS
10.0000 mg | ORAL_TABLET | Freq: Every day | ORAL | Status: DC
  Administered 2024-10-11 – 2024-10-13 (×3): 10 mg via ORAL
  Filled 2024-10-11 (×3): qty 1

## 2024-10-11 MED ORDER — POTASSIUM CHLORIDE CRYS ER 20 MEQ PO TBCR
40.0000 meq | EXTENDED_RELEASE_TABLET | Freq: Once | ORAL | Status: AC
  Administered 2024-10-11: 40 meq via ORAL
  Filled 2024-10-11: qty 2

## 2024-10-11 MED ORDER — FUROSEMIDE 10 MG/ML IJ SOLN
20.0000 mg | Freq: Once | INTRAMUSCULAR | Status: AC
  Administered 2024-10-11: 20 mg via INTRAVENOUS
  Filled 2024-10-11: qty 2

## 2024-10-11 MED ORDER — PANTOPRAZOLE SODIUM 40 MG PO TBEC
40.0000 mg | DELAYED_RELEASE_TABLET | Freq: Two times a day (BID) | ORAL | Status: DC
  Administered 2024-10-11 – 2024-10-14 (×6): 40 mg via ORAL
  Filled 2024-10-11 (×6): qty 1

## 2024-10-11 MED ORDER — ACETAMINOPHEN 325 MG PO TABS
650.0000 mg | ORAL_TABLET | Freq: Four times a day (QID) | ORAL | Status: DC | PRN
  Administered 2024-10-12 (×2): 650 mg via ORAL
  Filled 2024-10-11 (×2): qty 2

## 2024-10-11 MED ORDER — HYDRALAZINE HCL 20 MG/ML IJ SOLN: 10.0000 mg | INTRAMUSCULAR | Status: DC | PRN

## 2024-10-11 MED ORDER — FLUTICASONE FUROATE-VILANTEROL 200-25 MCG/ACT IN AEPB
1.0000 | INHALATION_SPRAY | Freq: Every day | RESPIRATORY_TRACT | Status: DC
  Administered 2024-10-12 – 2024-10-14 (×3): 1 via RESPIRATORY_TRACT
  Filled 2024-10-11: qty 28

## 2024-10-11 MED ORDER — DILTIAZEM HCL ER COATED BEADS 180 MG PO CP24
300.0000 mg | ORAL_CAPSULE | Freq: Every day | ORAL | Status: DC
  Administered 2024-10-11 – 2024-10-12 (×2): 300 mg via ORAL
  Filled 2024-10-11 (×2): qty 1

## 2024-10-11 MED ORDER — ONDANSETRON HCL 4 MG/2ML IJ SOLN: 4.0000 mg | Freq: Four times a day (QID) | INTRAMUSCULAR | Status: DC | PRN

## 2024-10-11 MED ORDER — FUROSEMIDE 10 MG/ML IJ SOLN: 20.0000 mg | Freq: Two times a day (BID) | INTRAMUSCULAR | Status: DC

## 2024-10-11 MED ORDER — TRIMETHOBENZAMIDE HCL 100 MG/ML IM SOLN: 200.0000 mg | Freq: Four times a day (QID) | INTRAMUSCULAR | Status: DC | PRN

## 2024-10-11 MED ORDER — ALBUTEROL SULFATE (2.5 MG/3ML) 0.083% IN NEBU: 2.5000 mg | INHALATION_SOLUTION | Freq: Four times a day (QID) | RESPIRATORY_TRACT | Status: DC | PRN

## 2024-10-11 MED ORDER — APIXABAN 5 MG PO TABS
5.0000 mg | ORAL_TABLET | Freq: Two times a day (BID) | ORAL | Status: DC
  Administered 2024-10-11 – 2024-10-14 (×7): 5 mg via ORAL
  Filled 2024-10-11 (×7): qty 1

## 2024-10-11 MED ORDER — SODIUM CHLORIDE 0.9% FLUSH
3.0000 mL | Freq: Two times a day (BID) | INTRAVENOUS | Status: DC
  Administered 2024-10-11 – 2024-10-14 (×7): 3 mL via INTRAVENOUS

## 2024-10-11 MED ORDER — METOPROLOL SUCCINATE ER 100 MG PO TB24
200.0000 mg | ORAL_TABLET | Freq: Every day | ORAL | Status: DC
  Administered 2024-10-11: 200 mg via ORAL
  Filled 2024-10-11: qty 2
  Filled 2024-10-11: qty 8

## 2024-10-11 MED ORDER — AMIODARONE HCL 200 MG PO TABS
200.0000 mg | ORAL_TABLET | Freq: Every day | ORAL | Status: DC
  Administered 2024-10-11 – 2024-10-14 (×4): 200 mg via ORAL
  Filled 2024-10-11 (×4): qty 1

## 2024-10-11 MED ORDER — MELATONIN 3 MG PO TABS: 3.0000 mg | ORAL_TABLET | Freq: Every evening | ORAL | Status: DC | PRN

## 2024-10-11 MED ORDER — DICLOFENAC SODIUM 1 % EX GEL
2.0000 g | Freq: Every day | CUTANEOUS | Status: DC
  Administered 2024-10-11 – 2024-10-13 (×4): 2 g via TOPICAL
  Filled 2024-10-11: qty 100

## 2024-10-11 MED ORDER — ACETAMINOPHEN 650 MG RE SUPP: 650.0000 mg | Freq: Four times a day (QID) | RECTAL | Status: DC | PRN

## 2024-10-11 MED ORDER — ALUM & MAG HYDROXIDE-SIMETH 200-200-20 MG/5ML PO SUSP
30.0000 mL | ORAL | Status: DC | PRN
  Administered 2024-10-11: 30 mL via ORAL
  Filled 2024-10-11: qty 30

## 2024-10-11 MED ORDER — FUROSEMIDE 10 MG/ML IJ SOLN
40.0000 mg | Freq: Two times a day (BID) | INTRAMUSCULAR | Status: DC
  Administered 2024-10-11 – 2024-10-12 (×2): 40 mg via INTRAVENOUS
  Filled 2024-10-11 (×2): qty 4

## 2024-10-11 MED ORDER — LOSARTAN POTASSIUM 50 MG PO TABS
100.0000 mg | ORAL_TABLET | Freq: Every day | ORAL | Status: DC
  Administered 2024-10-11: 100 mg via ORAL
  Filled 2024-10-11 (×2): qty 2

## 2024-10-11 MED ORDER — AZELASTINE HCL 0.1 % NA SOLN
1.0000 | Freq: Every day | NASAL | Status: DC
  Administered 2024-10-11 – 2024-10-14 (×4): 1 via NASAL
  Filled 2024-10-11: qty 30

## 2024-10-11 NOTE — Progress Notes (Signed)
" °  Carryover admission to the Day Admitter.  I discussed this case with the EDP, Dr. Emil.  Per these discussions:   This is a 83 year old female with history of chronic diastolic heart failure, who is being admitted with suspected acute on chronic diastolic heart failure after presenting with recent worsening of shortness of breath, particularly with exertion, as well as recent unintentional weight gain.  In February 2025, she was reported to be admitted for various laboratory abnormalities, with suspicion for contribution from outpatient diuretic medications.  At that time, her home diuretic medications were held, and have not subsequently been resumed leading up to development of recent progressive shortness of breath and increasing weight.  No report of any recent chest pain.  In the ED today, afebrile; heart rates in the 60s; systolic blood pressures in the 170s; and oxygen saturation 95 to 100% on room air.  Presenting labs notable for proBNP of 4030.  2 view chest x-ray performed in the ED this evening showed cardiomegaly with evidence of pulmonary edema, in the absence of overt evidence of infiltrate.  In the ED this evening, she received Lasix  20 mg IV x 1 dose.  I have placed an order for observation to med/tele for further evaluation and management of suspected acute on chronic diastolic heart failure exacerbation.  I have placed some additional preliminary admit orders via the adult multi-morbid admission order set. I have also ordered additional IV Lasix  in the form of Lasix  20 mg IV twice daily, with next dose to occur around 10 AM this morning.  Also ordered as needed IV hydralazine for systolic blood pressure greater than 170 mmHg, and ordered morning labs in the form of CMP, CBC, and magnesium  level.    Eva Pore, DO Hospitalist  "

## 2024-10-11 NOTE — ED Provider Notes (Signed)
 " [place] EMERGENCY DEPARTMENT AT [facility] Provider Note   CSN: [id number] Arrival date & time: 10/10/24  1645     Patient presents with: Shortness of Breath and Leg Swelling   Alisha Terrell is a 83 y.o. female.   83 yo F with a chief plaint of difficulty breathing.  Has been going on for a couple weeks.  Progressively worsening.  Worsening lower extremity and abdominal edema.  Feels like she is gaining a lot of weight.  She used to be on diuretics but required admission for some electrolyte derangements about a year ago and has not been on them since.  She denies cough congestion or fever.  Denies chest pain or pressure.   Shortness of Breath      Prior to Admission medications  Medication Sig Start Date End Date Taking? Authorizing Provider  acetaminophen  (TYLENOL ) 500 MG tablet Take 500 mg by mouth 2 (two) times daily. Two Tablets Every 6 hours as Needed For Pain    [provider]  ADVAIR DISKUS 250-50 MCG/DOSE AEPB Inhale 1 puff into the lungs 2 (two) times daily.  08/22/15   [provider]  amiodarone  (PACERONE ) 200 MG tablet Take 1 tablet (200 mg total) by mouth daily. 02/22/24   Acharya, Gayatri A, MD  apixaban  (ELIQUIS ) 5 MG TABS tablet Take 1 tablet (5 mg total) by mouth 2 (two) times daily. 08/02/24   Acharya, Gayatri A, MD  Azelastine  HCl 137 MCG/SPRAY SOLN Place 1 spray into both nostrils daily. 03/12/22   [provider]  calcium  carbonate (TUMS - DOSED IN MG ELEMENTAL CALCIUM ) 500 MG chewable tablet Chew 1 tablet by mouth daily as needed for indigestion or heartburn.    [provider]  Dextromethorphan -Guaifenesin  (ROBITUSSIN COUGH/CHEST DM MAX PO) Take 1 mL by mouth 2 (two) times daily as needed (for cold symptoms).    [provider]  diclofenac  Sodium (VOLTAREN ) 1 % GEL Apply 2 g topically at bedtime. 08/16/19   [provider]  diltiazem  (CARDIZEM  CD) 300 MG 24 hr capsule Take 1 capsule by mouth  daily. 09/14/23   [provider]  fluticasone  (FLONASE ) 50 MCG/ACT nasal spray Place 1 spray into both nostrils every morning. 09/26/15   [provider]  ibandronate  (BONIVA ) 150 MG tablet Take 150 mg by mouth every 30 (thirty) days.  08/16/19   [provider]  lidocaine  (LMX) 4 % cream Apply 1 Application topically as needed (back pain).    [provider]  losartan  (COZAAR ) 100 MG tablet Take 1 tablet (100 mg total) by mouth daily. 01/12/24   Acharya, Gayatri A, MD  melatonin 5 MG TABS Take 5 mg by mouth at bedtime.    [provider]  metoprolol  (TOPROL -XL) 200 MG 24 hr tablet TAKE 1 TABLET BY MOUTH DAILY TAKE WITH OR IMMEDIATLEY FOLLOWING A MEAL 04/29/24   Cleaver, Josefa HERO, NP  montelukast  (SINGULAIR ) 10 MG tablet Take 10 mg by mouth at bedtime. 09/10/15   [provider]  Multiple Vitamins-Minerals (CENTRUM SILVER ADULT 50+ PO) Take 1 tablet by mouth in the morning.    [provider]  omeprazole (PRILOSEC) 40 MG capsule Take 40 mg by mouth in the morning and at bedtime.  09/10/15   [provider]  PROAIR  HFA 108 (90 Base) MCG/ACT inhaler Inhale 1-2 puffs into the lungs at bedtime. 10/05/15   [provider]  [Paused] torsemide  (DEMADEX ) 20 MG tablet Take 1 tablet (20 mg total) by  mouth daily. Wait to take this until your doctor or other care provider tells you to start again. 12/04/23   Goodrich, Callie E, PA-C  traMADol  (ULTRAM ) 50 MG tablet Take 50 mg by mouth every 6 (six) hours as needed. 03/23/19   [provider]    Allergies: Codeine and Omnicef [cefdinir]    Review of Systems  Respiratory:  Positive for shortness of breath.     Updated Vital Signs BP (!) 178/74 (BP Location: Right Arm)   Pulse 65   Temp 97.9 F (36.6 C) (Oral)   Resp 17   Ht 4' 8 (1.422 m)   Wt 75.3 kg   SpO2 100%   BMI 37.22 kg/m   Physical Exam Vitals and nursing note reviewed.  Constitutional:      General:  She is not in acute distress.    Appearance: She is well-developed. She is not diaphoretic.  HENT:     Head: Normocephalic and atraumatic.  Eyes:     Pupils: Pupils are equal, round, and reactive to light.  Neck:     Vascular: JVD (to mid neck) present.  Cardiovascular:     Rate and Rhythm: Normal rate and regular rhythm.     Heart sounds: No murmur heard.    No friction rub. No gallop.  Pulmonary:     Effort: Pulmonary effort is normal.     Breath sounds: Rales (bases) present. No wheezing.  Abdominal:     General: There is no distension.     Palpations: Abdomen is soft.     Tenderness: There is no abdominal tenderness.  Musculoskeletal:        General: No tenderness.     Cervical back: Normal range of motion and neck supple.     Right lower leg: Edema present.     Left lower leg: Edema present.     Comments: Left lower extremity more swollen than the right lower extremity which is normal per her.  3-4+, up to the knees  Skin:    General: Skin is warm and dry.  Neurological:     Mental Status: She is alert and oriented to person, place, and time.  Psychiatric:        Behavior: Behavior normal.     (all labs ordered are listed, but only abnormal results are displayed) Labs Reviewed  BASIC METABOLIC PANEL WITH GFR - Abnormal; Notable for the following components:      Result Value   Glucose, Bld 126 (*)    All other components within normal limits  CBC - Abnormal; Notable for the following components:   Hemoglobin 11.0 (*)    HCT 34.1 (*)    RDW 15.7 (*)    All other components within normal limits  PRO BRAIN NATRIURETIC PEPTIDE - Abnormal; Notable for the following components:   Pro Brain Natriuretic Peptide 4,030.0 (*)    All other components within normal limits  URINALYSIS, ROUTINE W REFLEX MICROSCOPIC - Abnormal; Notable for the following components:   Color, Urine STRAW (*)    All other components within normal limits    EKG: EKG  Interpretation Date/Time:  Monday October 10 2024 19:13:35 EST Ventricular Rate:  68 PR Interval:  300 QRS Duration:  148 QT Interval:  490 QTC Calculation: 521 R Axis:   90  Text Interpretation: Sinus rhythm with 1st degree A-V block Right bundle branch block T wave abnormality, consider inferior ischemia Abnormal ECG No significant change since last tracing Confirmed by Emil Share (  [id number]) on 10/11/2024 3:35:56 AM  Radiology: DG Chest 2 View Result Date: 10/10/2024 EXAM: 2 VIEW(S) XRAY OF THE CHEST 10/10/2024 05:52:00 PM COMPARISON: 12/01/2023 CLINICAL HISTORY: shob FINDINGS: LUNGS AND PLEURA: Mild pulmonary edema. Interstitial prominence. No pleural effusion. No pneumothorax. HEART AND MEDIASTINUM: Cardiomegaly. Tortuous aorta with atherosclerosis. BONES AND SOFT TISSUES: Thoracic kyphosis. IMPRESSION: 1. Mild pulmonary edema with interstitial prominence. 2. Cardiomegaly with a tortuous aorta and atherosclerosis. Electronically signed by: Greig Pique MD 10/10/2024 08:36 PM EST RP Workstation: [id number]     Procedures   Medications Ordered in the ED  furosemide  (LASIX ) injection 20 mg (has no administration in time range)                                    Medical Decision Making Amount and/or Complexity of Data Reviewed Labs: ordered. Radiology: ordered.  Risk Prescription drug management.   83 yo F with a chief complaint of difficulty breathing.  This been going on for a couple weeks.  Progressive weight gain and edema.  Workup obtained through the MSE process BNP elevated chest x-ray on my independent interpretation with edema.  No acute anemia no significant electrolyte abnormalities.  UA negative for infection.  Patient was very short of breath with very minimal exertion.  Will give a bolus dose of diuretics here.  Will discuss with medicine for admission.  The patients results and plan were reviewed and discussed.   Any x-rays performed were independently reviewed  by myself.   Differential diagnosis were considered with the presenting HPI.  Medications  furosemide  (LASIX ) injection 20 mg (has no administration in time range)    Vitals:   10/10/24 1703 10/10/24 1713 10/11/24 0019 10/11/24 0400  BP: (!) 172/97 (!) 179/77 (!) 198/82 (!) 178/74  Pulse: (!) 59 61 66 65  Resp: 19 (!) 21 18 17   Temp: 97.8 F (36.6 C) 98.1 F (36.7 C) 98.5 F (36.9 C) 97.9 F (36.6 C)  TempSrc: Oral  Oral Oral  SpO2: 95% 95% 97% 100%  Weight:      Height:        Final diagnoses:  Acute on chronic systolic congestive heart failure (HCC)    Admission/ observation were discussed with the admitting physician, patient and/or family and they are comfortable with the plan.       Final diagnoses:  Acute on chronic systolic congestive heart failure [facility])    ED Discharge Orders     None          Emil Share, DO 10/11/24 0411  "

## 2024-10-11 NOTE — H&P (Addendum)
 " History and Physical    Patient: Alisha Terrell FMW:[id number] DOB: 02-01-1941 DOA: 10/10/2024 DOS: the patient was seen and examined on 10/11/2024 PCP: Alisha Elouise SQUIBB, DO  Patient coming from: Home  Chief Complaint:  Chief Complaint  Patient presents with   Shortness of Breath   Leg Swelling   HPI: Alisha Terrell is Terrell 83 y.o. female with medical history significant of hypertension, diastolic congestive heart failure, paroxysmal atrial fibrillation, asthma, chronic back pain, GERD, OSA, and obesity presents with shortness of breath and leg swelling.  For the past one to two weeks, she has experienced worsening shortness of breath, particularly over the weekend. She has difficulty breathing both while lying down and walking short distances, such as ten steps to her bed, which leaves her feeling very out of breath. She describes Terrell sensation of heaviness in her chest during these episodes, though she denies any chest pain.  She notes swelling in her legs and abdomen, with her pants becoming tight, indicating abdominal swelling. She has Terrell history of being on diuretics, which were discontinued about Terrell year ago after multiple medication adjustments during Terrell hospital stay in February due to abnormal blood work. She recalls issues with fluctuating potassium levels during that time, which eventually stabilized after several medication changes.  She has arthritis, which causes her constant pain, but she cannot take her arthritis medication due to Terrell blood thinner she is on.  In the emergency department patient was noted be afebrile with blood pressure was elevated up to 198/82, and O2 saturations maintained on room air.  Labs since 12/29 significant for pro BNP 4030, hemoglobin 11, and potassium 4.2->3.4.  Chest x-ray noted mild pulmonary edema with interstitial prominence, cardiomegaly with torturous aorta, and atherosclerosis.  Patient had been given Lasix  20 mg IV.   Review of Systems: As  mentioned in the history of present illness. All other systems reviewed and are negative. Past Medical History:  Diagnosis Date   ARF (acute renal failure)    2020  resolved  it was presrnt when she was septic   Arthritis    Asthma    CHF (congestive heart failure) (HCC)    Chronic back pain    Class 2 obesity due to excess calories with body mass index (BMI) of 35.0 to 35.9 in adult    Dyspnea    ocassional   GERD (gastroesophageal reflux disease)    Headache    Hypertension    OSA (obstructive sleep apnea)    PAF (paroxysmal atrial fibrillation) (HCC)    TIA (transient ischemic attack) 2018   no resedule   Past Surgical History:  Procedure Laterality Date   ABDOMINAL SURGERY     CESAREAN SECTION     CHOLECYSTECTOMY     LESION EXCISION WITH COMPLEX REPAIR Left 07/25/2022   Procedure: LESION EXCISION OF LEFT BUCCAL MUCOSA;  Surgeon: Llewellyn Gerard LABOR, DO;  Location: [facility] OR;  Service: ENT;  Laterality: Left;   Social History:  reports that she has never smoked. She has been exposed to tobacco smoke. She has never used smokeless tobacco. She reports current alcohol use of about 3.0 standard drinks of alcohol per week. She reports that she does not use drugs.  Allergies[1]  Family History  Problem Relation Age of Onset   Hypertension Mother 65   Pneumonia Father 52    Prior to Admission medications  Medication Sig Start Date End Date Taking? Authorizing Provider  acetaminophen  (TYLENOL ) 500 MG tablet Take 500  mg by mouth 2 (two) times daily. Two Tablets Every 6 hours as Needed For Pain    [provider]  ADVAIR DISKUS 250-50 MCG/DOSE AEPB Inhale 1 puff into the lungs 2 (two) times daily.  08/22/15   [provider]  amiodarone  (PACERONE ) 200 MG tablet Take 1 tablet (200 mg total) by mouth daily. 02/22/24   Alisha, Gayatri A, MD  apixaban  (ELIQUIS ) 5 MG TABS tablet Take 1 tablet (5 mg total) by mouth 2 (two) times daily. 08/02/24   Alisha, Gayatri A, MD   Azelastine  HCl 137 MCG/SPRAY SOLN Place 1 spray into both nostrils daily. 03/12/22   [provider]  calcium  carbonate (TUMS - DOSED IN MG ELEMENTAL CALCIUM ) 500 MG chewable tablet Chew 1 tablet by mouth daily as needed for indigestion or heartburn.    [provider]  Dextromethorphan -Guaifenesin  (ROBITUSSIN COUGH/CHEST DM MAX PO) Take 1 mL by mouth 2 (two) times daily as needed (for cold symptoms).    [provider]  diclofenac  Sodium (VOLTAREN ) 1 % GEL Apply 2 g topically at bedtime. 08/16/19   [provider]  diltiazem  (CARDIZEM  CD) 300 MG 24 hr capsule Take 1 capsule by mouth daily. 09/14/23   [provider]  fluticasone  (FLONASE ) 50 MCG/ACT nasal spray Place 1 spray into both nostrils every morning. 09/26/15   [provider]  ibandronate  (BONIVA ) 150 MG tablet Take 150 mg by mouth every 30 (thirty) days.  08/16/19   [provider]  lidocaine  (LMX) 4 % cream Apply 1 Application topically as needed (back pain).    [provider]  losartan  (COZAAR ) 100 MG tablet Take 1 tablet (100 mg total) by mouth daily. 01/12/24   Alisha, Gayatri A, MD  melatonin 5 MG TABS Take 5 mg by mouth at bedtime.    [provider]  metoprolol  (TOPROL -XL) 200 MG 24 hr tablet TAKE 1 TABLET BY MOUTH DAILY TAKE WITH OR IMMEDIATLEY FOLLOWING Terrell MEAL 04/29/24   Alisha Josefa HERO, NP  montelukast  (SINGULAIR ) 10 MG tablet Take 10 mg by mouth at bedtime. 09/10/15   [provider]  Multiple Vitamins-Minerals (CENTRUM SILVER ADULT 50+ PO) Take 1 tablet by mouth in the morning.    [provider]  omeprazole (PRILOSEC) 40 MG capsule Take 40 mg by mouth in the morning and at bedtime.  09/10/15   [provider]  PROAIR  HFA 108 (90 Base) MCG/ACT inhaler Inhale 1-2 puffs into the lungs at bedtime. 10/05/15   [provider]  [Paused] torsemide  (DEMADEX ) 20 MG tablet Take 1 tablet (20 mg total) by mouth daily. Wait to  take this until your doctor or other care provider tells you to start again. 12/04/23   Goodrich, Alisha E, PA-C  traMADol  (ULTRAM ) 50 MG tablet Take 50 mg by mouth every 6 (six) hours as needed. 03/23/19   [provider]    Physical Exam: Vitals:   10/11/24 0350 10/11/24 0400 10/11/24 0627 10/11/24 0645  BP:  (!) 178/74 (!) 172/72 (!) 166/88  Pulse:  65 69 68  Resp:  17 17 17   Temp:  97.9 F (36.6 C) 97.9 F (36.6 C)   TempSrc:  Oral Oral   SpO2: 99% 100% 99% 98%  Weight:      Height:          Constitutional: Elderly female currently in no acute distress Eyes: PERRL, lids and conjunctivae normal ENMT: Mucous membranes are moist. Posterior pharynx clear of any exudate or lesions.Normal dentition.  Neck: normal, supple, JVD present. Respiratory: Normal respiratory effort with decreased aeration noted in the lower lung fields. Cardiovascular: Regular rate and rhythm, no murmurs / rubs / gallops.  2+ pitting edema noted of the lower extremities.  2+ pedal pulses.  Abdomen: no tenderness, no masses palpated. Bowel sounds positive.  Musculoskeletal: no clubbing / cyanosis. No joint deformity upper and lower extremities. Good ROM, no contractures. Normal muscle tone.  Skin: no rashes, lesions, ulcers. No induration Neurologic: CN 2-12 grossly intact. Sensation intact, DTR normal. Strength 5/5 in all 4.  Psychiatric: Normal judgment and insight. Alert and oriented x 3. Normal mood.   Data Reviewed:   EKG revealed sinus rhythm at 68 bpm with first-degree AV block, right bundle branch block, and QTc noted to be 521.  Assessment and Plan:  Diastolic congestive heart failure Acute on chronic.  Patient presents with complaints of swelling, shortness of breath with exertion, as well as orthopnea.  She had been taken off of diuretics earlier this year due to electrolyte abnormalities.  proBNP 4030.  Chest x-ray noting pulmonary edema with cardiomegaly.  Last echocardiogram noted EF  to be 55-60% with indeterminate diastolic parameters.  Patient had been given Lasix  IV. - Admit to telemetry bed - Heart failure order set utilized - Strict I&O's and daily weights - Check TSH - Check echocardiogram - Lasix  40 mg IV twice daily.  Adjust diuresis as deemed medically appropriate - Optimize goal-directed therapy  Hypertensive urgency Blood pressures initially elevated up to 198/82. - Continue Cardizem , losartan , and metoprolol   Paroxysmal atrial fibrillation Patient currently in sinus rhythm with first-degree AV block. - Continue amiodarone (monitor QT interval) and Cardizem   Asthma No wheezing noted on physical exam at this time - Continue Advair, Singulair , albuterol  nebs as needed  Normocytic anemia Chronic.  Hemoglobin noted to be 11.  No reports of bleeding - Continue to monitor  Arthritis, involving multiple sites - Continue Voltaren  gel  Obesity, class II BMI 37.22 kg/m  DVT prophylaxis: Continue Eliquis  Advance Care Planning:   Code Status: Full Code    Consults: None   Severity of Illness: The appropriate patient status for this patient is INPATIENT. Inpatient status is judged to be reasonable and necessary in order to provide the required intensity of service to ensure the patient's safety. The patient's presenting symptoms, physical exam findings, and initial radiographic and laboratory data in the context of their chronic comorbidities is felt to place them at high risk for further clinical deterioration. Furthermore, it is not anticipated that the patient will be medically stable for discharge from the hospital within 2 midnights of admission.   * I certify that at the point of admission it is my clinical judgment that the patient will require inpatient hospital care spanning beyond 2 midnights from the point of admission due to high intensity of service, high risk for further deterioration and high frequency of surveillance  required.*  Author: Maximino DELENA Sharps, MD 10/11/2024 7:49 AM  For on call review [email or web address].      [1]  Allergies Allergen Reactions   Codeine Rash and Other (See Comments)    Agitation, bad dreams   Omnicef [Cefdinir] Nausea And Vomiting    Confirmed with patient at bedside that no rash - just had significant GI effects   "

## 2024-10-11 NOTE — Progress Notes (Signed)
" °  Echocardiogram 2D Echocardiogram has been performed.  Devora Ellouise SAUNDERS 10/11/2024, 1:54 PM "

## 2024-10-11 NOTE — ED Notes (Signed)
 CCMD called to transfer from 26 to 17

## 2024-10-11 NOTE — Plan of Care (Signed)

## 2024-10-12 ENCOUNTER — Telehealth (HOSPITAL_COMMUNITY): Payer: Self-pay

## 2024-10-12 ENCOUNTER — Other Ambulatory Visit (HOSPITAL_COMMUNITY): Payer: Self-pay

## 2024-10-12 DIAGNOSIS — I5033 Acute on chronic diastolic (congestive) heart failure: Secondary | ICD-10-CM | POA: Diagnosis not present

## 2024-10-12 LAB — BASIC METABOLIC PANEL WITH GFR
Anion gap: 13 (ref 5–15)
BUN: 16 mg/dL (ref 8–23)
CO2: 25 mmol/L (ref 22–32)
Calcium: 8.7 mg/dL — ABNORMAL LOW (ref 8.9–10.3)
Chloride: 100 mmol/L (ref 98–111)
Creatinine, Ser: 0.95 mg/dL (ref 0.44–1.00)
GFR, Estimated: 59 mL/min — ABNORMAL LOW
Glucose, Bld: 97 mg/dL (ref 70–99)
Potassium: 3.4 mmol/L — ABNORMAL LOW (ref 3.5–5.1)
Sodium: 138 mmol/L (ref 135–145)

## 2024-10-12 LAB — CBC
HCT: 34.2 % — ABNORMAL LOW (ref 36.0–46.0)
Hemoglobin: 11.7 g/dL — ABNORMAL LOW (ref 12.0–15.0)
MCH: 28.4 pg (ref 26.0–34.0)
MCHC: 34.2 g/dL (ref 30.0–36.0)
MCV: 83 fL (ref 80.0–100.0)
Platelets: 293 K/uL (ref 150–400)
RBC: 4.12 MIL/uL (ref 3.87–5.11)
RDW: 15.5 % (ref 11.5–15.5)
WBC: 7.1 K/uL (ref 4.0–10.5)
nRBC: 0 % (ref 0.0–0.2)

## 2024-10-12 MED ORDER — FUROSEMIDE 10 MG/ML IJ SOLN
80.0000 mg | Freq: Once | INTRAMUSCULAR | Status: AC
  Administered 2024-10-12: 80 mg via INTRAVENOUS
  Filled 2024-10-12: qty 8

## 2024-10-12 MED ORDER — LOSARTAN POTASSIUM 25 MG PO TABS
25.0000 mg | ORAL_TABLET | Freq: Every day | ORAL | Status: DC
  Administered 2024-10-12 – 2024-10-13 (×2): 25 mg via ORAL
  Filled 2024-10-12 (×3): qty 1

## 2024-10-12 MED ORDER — METOPROLOL SUCCINATE ER 100 MG PO TB24
100.0000 mg | ORAL_TABLET | Freq: Every day | ORAL | Status: DC
  Administered 2024-10-12 – 2024-10-14 (×3): 100 mg via ORAL
  Filled 2024-10-12 (×2): qty 1

## 2024-10-12 MED ORDER — POTASSIUM CHLORIDE CRYS ER 20 MEQ PO TBCR
40.0000 meq | EXTENDED_RELEASE_TABLET | Freq: Two times a day (BID) | ORAL | Status: AC
  Administered 2024-10-12 (×2): 40 meq via ORAL
  Filled 2024-10-12 (×2): qty 2

## 2024-10-12 MED ORDER — FUROSEMIDE 10 MG/ML IJ SOLN
60.0000 mg | Freq: Once | INTRAMUSCULAR | Status: DC
  Filled 2024-10-12: qty 6

## 2024-10-12 NOTE — TOC Initial Note (Addendum)
 Transition of Care [facility]) - Initial/Assessment Note    Patient Details  Name: Alisha Terrell MRN: [id number] Date of Birth: [birth year]  Transition of Care [facility]) CM/SW Contact:    Marval Gell, RN Phone Number: 10/12/2024, 12:31 PM  Clinical Narrative:                  Beatris w patient over the phone. She would like [facility] services with [facility] whom she had before, referral placed to [facility], pending acceptance.  She has a rollator at home, is currently RA, no DME needs identified at this time.  [facility] accepted   Expected Discharge Plan: Home w Home Health Services Barriers to Discharge: Continued Medical Work up   Patient Goals and CMS Choice Patient states their goals for this hospitalization and ongoing recovery are:: to go home CMS Medicare.gov Compare Post Acute Care list provided to:: Patient Choice offered to / list presented to : Patient      Expected Discharge Plan and Services   Discharge Planning Services: CM Consult Post Acute Care Choice: Home Health Living arrangements for the past 2 months: Single Family Home                           HH Arranged: PT HH Agency: [facility] Date [facility] Agency Contacted: 10/12/24 Time HH Agency Contacted: 1231 Representative spoke with at [facility] Agency: Angie  Prior Living Arrangements/Services Living arrangements for the past 2 months: Single Family Home                     Activities of Daily Living   ADL Screening (condition at time of admission) Independently performs ADLs?: Yes (appropriate for developmental age) Is the patient deaf or have difficulty hearing?: Yes Does the patient have difficulty seeing, even when wearing glasses/contacts?: No Does the patient have difficulty concentrating, remembering, or making decisions?: No  Permission Sought/Granted                  Emotional Assessment              Admission diagnosis:  Acute on chronic diastolic heart failure (HCC)  [P49.66] Acute on chronic systolic congestive heart failure (HCC) [I50.23] Heart failure with preserved ejection fraction (HCC) [I50.30] Patient Active Problem List   Diagnosis Date Noted   Acute on chronic diastolic heart failure (HCC) 10/11/2024   Hypertensive urgency 10/11/2024   Asthma 10/11/2024   Hyperkalemia 12/08/2023   Chronic pain 12/04/2023   Arthritis 12/04/2023   Chronic kidney disease (CKD), stage III (moderate) (HCC) 12/04/2023   Acute on chronic heart failure with preserved ejection fraction (HFpEF) (HCC) 11/30/2023   Lesion of buccal mucosa 07/25/2022   Acute renal failure 03/29/2021   Anemia 03/29/2021   Paroxysmal atrial fibrillation (HCC) 03/06/2021   AKI (acute kidney injury)    Severe sepsis (HCC)    Toe ulcer, left, limited to breakdown of skin (HCC)    Age-related osteoporosis without current pathological fracture 08/11/2018   Thyroid  nodule 08/11/2018   Localized, primary osteoarthritis of hand 06/17/2018   Bilateral hand pain 06/01/2018   Encounter for Medicare annual wellness exam 07/29/2017   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 [place] [address] Phone: [phone] Fax: [phone]  [facility] [id number] [place], [place] - [address] [place] [address] Phone: [phone] Fax: [phone]     Social Drivers of Health (SDOH) Social History: SDOH Screenings   Food  Insecurity: No Food Insecurity (10/11/2024)  Housing: Low Risk (10/11/2024)  Transportation Needs: No Transportation Needs (10/11/2024)  Utilities: Not At Risk (10/11/2024)  Financial Resource Strain: Patient Unable To Answer (04/26/2024)   Received from [facility]  Social Connections: Moderately Isolated (10/11/2024)  Tobacco Use: Medium Risk (10/10/2024)   SDOH Interventions:     Readmission Risk Interventions     No data to display

## 2024-10-12 NOTE — Progress Notes (Signed)
 " Alisha Terrell  FMW:[id number] DOB: 22-Aug-1941 DOA: 10/10/2024 PCP: Lazoff, Shawn P, DO  83/F with obesity, hypertension, diastolic CHF, paroxysmal A-fib, asthma, chronic back pain presented to the ED with shortness of breath and leg swelling for 1 to 2 weeks.  Reports that she has been off diuretics/torsemide  for over 8-9 months after some abnormal blood work in February. In the ED, BP 198/82, proBNP 4030, chest x-ray with mild pulmonary edema and interstitial prominence  Subjective: -Feels better overall, breathing is improving  Assessment and Plan:  Acute on chronic diastolic CHF - Last echo 12/30 noted EF 60-65%, grade 2 DD, normal RV - Improving with diuresis, continue IV Lasix  1 more day - Misunderstood home meds and was off torsemide  for> 6 months, in addition I suspect A-fib is also likely contributing, will request cards eval - Continue ARB, decrease Toprol  dose to 100 mg  PAFib -in Afib at this time, HR in 50-60 range on Amio, Toprol  200 and Cardizem  300mg  daily -will hold Cardizem  today, lowered Toprol  to 100 mg -continue eliquis  - Likely contributing to fluid overload as well, cards consult  Hypertensive urgency Improved, meds as above  Asthma - Stable, continue Advair, Singulair , albuterol  as needed  Normocytic anemia Stable, monitor  Obesity, BMI 37.2    DVT prophylaxis: Eliquis  Code Status: Full code Family Communication: None present Disposition Plan: Home likely 1 to 2 days  Consultants:    Procedures:   Antimicrobials:    Objective: Vitals:   10/11/24 2348 10/12/24 0359 10/12/24 0710 10/12/24 0841  BP: (!) 182/72 (!) 148/65 (!) 153/64 (!) 141/67  Pulse: 67 60 61 (!) 53  Resp: 15 (!) 21 19 18   Temp: 98.4 F (36.9 C) 98.3 F (36.8 C) (!) 97.5 F (36.4 C) 97.7 F (36.5 C)  TempSrc: Oral Oral Oral Oral  SpO2: 98% 97% 99% 93%  Weight:  70.8 kg    Height:        Intake/Output Summary (Last 24 hours) at 10/12/2024  1004 Last data filed at 10/12/2024 0857 Gross per 24 hour  Intake 1020 ml  Output 500 ml  Net 520 ml   Filed Weights   10/10/24 1657 10/11/24 1200 10/12/24 0359  Weight: 75.3 kg 71 kg 70.8 kg    Examination:  General exam: Appears calm and comfortable obese, no distress Respiratory system: Rare basilar rales Cardiovascular system: S1 & S2 heard, irregular Abd: nondistended, soft and nontender.Normal bowel sounds heard. Central nervous system: Alert and oriented. No focal neurological deficits. Extremities: 1+ edema Skin: No rashes Psychiatry:  Mood & affect appropriate.     Data Reviewed:   CBC: Recent Labs  Lab 10/10/24 1914 10/11/24 0442 10/12/24 0346  WBC 9.3 8.8 7.1  NEUTROABS  --  7.0  --   HGB 11.0* 11.0* 11.7*  HCT 34.1* 33.7* 34.2*  MCV 86.8 86.0 83.0  PLT 296 255 293   Basic Metabolic Panel: Recent Labs  Lab 10/10/24 1914 10/11/24 0442 10/12/24 0346  NA 136 138 138  K 4.2 3.4* 3.4*  CL 100 102 100  CO2 23 24 25   GLUCOSE 126* 104* 97  BUN 15 11 16   CREATININE 0.78 0.70 0.95  CALCIUM  9.3 9.2 8.7*  MG  --  2.0  --    GFR: Estimated Creatinine Clearance: 35.8 mL/min (by C-G formula based on SCr of 0.95 mg/dL). Liver Function Tests: Recent Labs  Lab 10/11/24 0442  AST 26  ALT 18  ALKPHOS 62  BILITOT 0.6  PROT 6.9  ALBUMIN 4.2   No results for input(s): LIPASE, AMYLASE in the last 168 hours. No results for input(s): AMMONIA in the last 168 hours. Coagulation Profile: No results for input(s): INR, PROTIME in the last 168 hours. Cardiac Enzymes: No results for input(s): CKTOTAL, CKMB, CKMBINDEX, TROPONINI in the last 168 hours. BNP (last 3 results) Recent Labs    10/10/24 1921  PROBNP 4,030.0*   HbA1C: No results for input(s): HGBA1C in the last 72 hours. CBG: No results for input(s): GLUCAP in the last 168 hours. Lipid Profile: No results for input(s): CHOL, HDL, LDLCALC, TRIG, CHOLHDL, LDLDIRECT  in the last 72 hours. Thyroid  Function Tests: Recent Labs    10/11/24 0442  TSH 1.510   Anemia Panel: No results for input(s): VITAMINB12, FOLATE, FERRITIN, TIBC, IRON, RETICCTPCT in the last 72 hours. Urine analysis:    Component Value Date/Time   COLORURINE STRAW (A) 10/10/2024 1917   APPEARANCEUR CLEAR 10/10/2024 1917   LABSPEC 1.006 10/10/2024 1917   PHURINE 7.0 10/10/2024 1917   GLUCOSEU NEGATIVE 10/10/2024 1917   HGBUR NEGATIVE 10/10/2024 1917   BILIRUBINUR NEGATIVE 10/10/2024 1917   KETONESUR NEGATIVE 10/10/2024 1917   PROTEINUR NEGATIVE 10/10/2024 1917   NITRITE NEGATIVE 10/10/2024 1917   LEUKOCYTESUR NEGATIVE 10/10/2024 1917   Sepsis Labs: @LABRCNTIP (procalcitonin:4,lacticidven:4)  )No results found for this or any previous visit (from the past 240 hours).   Radiology Studies: ECHOCARDIOGRAM COMPLETE Result Date: 10/11/2024    ECHOCARDIOGRAM REPORT   Patient Name:   Alisha Terrell Date of Exam: 10/11/2024 Medical Rec #:  [id number]          Height:       57.0 in Accession #:    [id number]         Weight:       166.0 lb Date of Birth:  17-May-1941          BSA:          1.662 m Patient Age:    83 years           BP:           166/88 mmHg Patient Gender: F                  HR:           64 bpm. Exam Location:  Inpatient Procedure: 2D Echo, Cardiac Doppler and Color Doppler (Both Spectral and Color            Flow Doppler were utilized during procedure). Indications:    I50.33 Acute on chronic diastolic (congestive) heart failure  History:        Patient has prior history of Echocardiogram examinations, most                 recent 12/01/2023. CHF, Abnormal ECG, TIA and COPD,                 Arrythmias:Atrial Fibrillation, Signs/Symptoms:Bacteremia; Risk                 Factors:Sleep Apnea and Hypertension.  Sonographer:    Ellouise Mose RDCS Referring Phys: [id number] RONDELL A SMITH IMPRESSIONS  1. Left ventricular ejection fraction, by estimation, is 60 to 65%. Left  ventricular ejection fraction by 2D MOD biplane is 62.1 %. The left ventricle has normal function. The left ventricle has no regional wall motion abnormalities. There is mild concentric left ventricular hypertrophy. Left ventricular diastolic parameters are consistent with Grade II  diastolic dysfunction (pseudonormalization). Elevated left atrial pressure. The E/e' is 19.  2. Right ventricular systolic function is normal. The right ventricular size is mildly enlarged. Tricuspid regurgitation signal is inadequate for assessing PA pressure.  3. The mitral valve is normal in structure. Mild mitral valve regurgitation. No evidence of mitral stenosis.  4. The aortic valve is tricuspid. Aortic valve regurgitation is not visualized. No aortic stenosis is present.  5. Aortic dilatation noted. There is dilatation of the ascending aorta, measuring 42 mm.  6. The inferior vena cava is dilated in size with >50% respiratory variability, suggesting right atrial pressure of 8 mmHg.  7. Pleural effusion noted. FINDINGS  Left Ventricle: Left ventricular ejection fraction, by estimation, is 60 to 65%. Left ventricular ejection fraction by 2D MOD biplane is 62.1 %. The left ventricle has normal function. The left ventricle has no regional wall motion abnormalities. The left ventricular internal cavity size was normal in size. There is mild concentric left ventricular hypertrophy. Left ventricular diastolic parameters are consistent with Grade II diastolic dysfunction (pseudonormalization). Elevated left atrial pressure. The E/e' is 77. Right Ventricle: The right ventricular size is mildly enlarged. No increase in right ventricular wall thickness. Right ventricular systolic function is normal. Tricuspid regurgitation signal is inadequate for assessing PA pressure. Left Atrium: Left atrial size was normal in size. Right Atrium: Right atrial size was normal in size. Pericardium: Pleural effusion noted. There is no evidence of pericardial  effusion. Mitral Valve: Tiger stripes seen in MR signal, but unable to measure full envelope. The mitral valve is normal in structure. Mild mitral valve regurgitation. No evidence of mitral valve stenosis. Tricuspid Valve: The tricuspid valve is normal in structure. Tricuspid valve regurgitation is trivial. No evidence of tricuspid stenosis. Aortic Valve: The aortic valve is tricuspid. Aortic valve regurgitation is not visualized. No aortic stenosis is present. Pulmonic Valve: The pulmonic valve was normal in structure. Pulmonic valve regurgitation is not visualized. No evidence of pulmonic stenosis. Aorta: The aortic root and ascending aorta are structurally normal, with no evidence of dilitation and aortic dilatation noted. There is dilatation of the ascending aorta, measuring 42 mm. Venous: A systolic blunting flow pattern is recorded from the right upper pulmonary vein. The inferior vena cava is dilated in size with greater than 50% respiratory variability, suggesting right atrial pressure of 8 mmHg. IAS/Shunts: No atrial level shunt detected by color flow Doppler.  LEFT VENTRICLE PLAX 2D                        Biplane EF (MOD) LVIDd:         4.17 cm         LV Biplane EF:   Left LVIDs:         3.20 cm                          ventricular LV PW:         1.37 cm                          ejection LV IVS:        1.37 cm                          fraction by LVOT diam:     2.10 cm  2D MOD LV SV:         82                               biplane is LV SV Index:   50                               62.1 %. LVOT Area:     3.46 cm LV IVRT:       88 msec         Diastology                                LV e' medial:    4.03 cm/s                                LV E/e' medial:  18.8 LV Volumes (MOD)               LV e' lateral:   11.20 cm/s LV vol d, MOD    90.1 ml       LV E/e' lateral: 6.8 A2C: LV vol d, MOD    107.0 ml A4C: LV vol s, MOD    38.6 ml A2C: LV vol s, MOD    38.9 ml A4C: LV SV MOD A2C:    51.5 ml LV SV MOD A4C:   107.0 ml LV SV MOD BP:    63.4 ml RIGHT VENTRICLE             IVC RV S prime:     16.40 cm/s  IVC diam: 2.20 cm TAPSE (M-mode): 2.0 cm                             PULMONARY VEINS                             Diastolic Velocity: 55.90 cm/s                             S/D Velocity:       0.90                             Systolic Velocity:  53.00 cm/s LEFT ATRIUM             Index        RIGHT ATRIUM           Index LA diam:        3.50 cm 2.11 cm/m   RA Area:     11.20 cm LA Vol (A2C):   44.2 ml 26.60 ml/m  RA Volume:   23.00 ml  13.84 ml/m LA Vol (A4C):   45.8 ml 27.56 ml/m LA Biplane Vol: 46.2 ml 27.80 ml/m  AORTIC VALVE LVOT Vmax:   123.00 cm/s LVOT Vmean:  77.100 cm/s LVOT VTI:    0.238 m  AORTA Ao Root diam: 3.30 cm Ao Asc diam:  4.05 cm MITRAL VALVE MV Area (PHT): 3.21 cm    SHUNTS MV Decel Time: 236 msec    Systemic VTI:  0.24  m MV E velocity: 75.80 cm/s  Systemic Diam: 2.10 cm MV A velocity: 70.00 cm/s MV E/A ratio:  1.08 Dalton McleanMD Electronically signed by Ezra Kanner Signature Date/Time: 10/11/2024/4:57:03 PM    Final    DG Chest 2 View Result Date: 10/10/2024 EXAM: 2 VIEW(S) XRAY OF THE CHEST 10/10/2024 05:52:00 PM COMPARISON: 12/01/2023 CLINICAL HISTORY: shob FINDINGS: LUNGS AND PLEURA: Mild pulmonary edema. Interstitial prominence. No pleural effusion. No pneumothorax. HEART AND MEDIASTINUM: Cardiomegaly. Tortuous aorta with atherosclerosis. BONES AND SOFT TISSUES: Thoracic kyphosis. IMPRESSION: 1. Mild pulmonary edema with interstitial prominence. 2. Cardiomegaly with a tortuous aorta and atherosclerosis. Electronically signed by: Greig Pique MD 10/10/2024 08:36 PM EST RP Workstation: [id number]     Scheduled Meds:  amiodarone   200 mg Oral Daily   apixaban   5 mg Oral BID   azelastine   1 spray Each Nare Daily   diclofenac  Sodium  2 g Topical QHS   fluticasone  furoate-vilanterol  1 puff Inhalation Daily   furosemide   40 mg Intravenous BID   losartan    100 mg Oral Daily   metoprolol   200 mg Oral Daily   montelukast   10 mg Oral QHS   pantoprazole   40 mg Oral BID   potassium chloride   40 mEq Oral BID   sodium chloride  flush  3 mL Intravenous Q12H   Continuous Infusions:   LOS: 1 day    Time spent:    Sigurd Pac, MD [facility]   10/12/2024, 10:04 AM    "

## 2024-10-12 NOTE — Progress Notes (Signed)
 Mobility Specialist Progress Note:    10/12/24 1030  Mobility  Activity Ambulated with assistance;Pivoted/transferred from bed to chair;Pivoted/transferred from chair to bed (Ankle Pumps, Leg Ext/Leg Lifts)  Level of Assistance Standby assist, set-up cues, supervision of patient - no hands on  Assistive Device None  Distance Ambulated (ft) 15 ft  Range of Motion/Exercises Active  Activity Response Tolerated fair  Mobility Referral Yes  Mobility visit 1 Mobility  Mobility Specialist Start Time (ACUTE ONLY) 1030  Mobility Specialist Stop Time (ACUTE ONLY) 1050  Mobility Specialist Time Calculation (min) (ACUTE ONLY) 20 min   Received pt sitting in recliner agreeable to session. No c/o any symptoms. Pt ambulated in room followed by exercises from recliner. Pt able to stand, move, and ambulate decently w/ no assist. Returned pt to recliner w/ all needs met.   Venetia Keel Mobility Specialist Please Neurosurgeon or Rehab Office at [phone]

## 2024-10-12 NOTE — Evaluation (Signed)
 Physical Therapy Evaluation Patient Details Name: Alisha Terrell MRN: [id number] DOB: 1941/04/18 Today's Date: 10/12/2024  History of Present Illness  Pt is an 83 y.o. F presenting to [facility] on 10/10/24 with SOB and leg swelling. Pt admitted with CHF exacerbation. PMH is significant for HTN, HF, A-fib, asthma, GERD, OSA, and obesity.  Clinical Impression  Prior to admittance, pt was mod I with mobility, utilizing rollator for both household and community level distances, and is independent with ADLs. Pt presents to evaluation with deficits in mobility, strength, power, activity tolerance, balance, and pain. Pt was able to ambulate with rollator and no physical assistance, but required 2 standing rest breaks due to lower extremities feeling fatigued. Pt would benefit form further gait training, and balance training. PT will continue to treat pt while she is admitted. Recommending HHPT at discharge to address remaining mobility deficits and optimize return to PLOF.    SpO2 100% at rest and 93% when ambulating HR: 61-64 bpm    If plan is discharge home, recommend the following: A little help with walking and/or transfers;A little help with bathing/dressing/bathroom;Assistance with cooking/housework;Assist for transportation;Help with stairs or ramp for entrance   Can travel by private vehicle        Equipment Recommendations None recommended by PT  Recommendations for Other Services       Functional Status Assessment Patient has had a recent decline in their functional status and demonstrates the ability to make significant improvements in function in a reasonable and predictable amount of time.     Precautions / Restrictions Precautions Precautions: Fall Recall of Precautions/Restrictions: Intact Restrictions Weight Bearing Restrictions Per Provider Order: No      Mobility  Bed Mobility Overal bed mobility: Needs Assistance             General bed mobility comments: pt  received in recliner and returned to recliner    Transfers Overall transfer level: Needs assistance Equipment used: Rollator (4 wheels) Transfers: Sit to/from Stand Sit to Stand: Supervision           General transfer comment: Pt completed STS from recliner with one hand stabilizing rollator and one hand pushing up from chair. Increased time to complete.    Ambulation/Gait Ambulation/Gait assistance: Contact guard assist Gait Distance (Feet): 125 Feet Assistive device: Rollator (4 wheels) Gait Pattern/deviations: Step-through pattern, Decreased stride length, Decreased weight shift to right, Antalgic, Trunk flexed, Wide base of support Gait velocity: reduced Gait velocity interpretation: <1.8 ft/sec, indicate of risk for recurrent falls   General Gait Details: Pt demonstrates reciprocal gait pattern with RLE in ER increasing BOS. Pt also displays reduced WB to RLE throughout secondary to pain.  Stairs            Wheelchair Mobility     Tilt Bed    Modified Rankin (Stroke Patients Only)       Balance Overall balance assessment: Needs assistance Sitting-balance support: No upper extremity supported, Feet supported Sitting balance-Leahy Scale: Fair Sitting balance - Comments: seated edge of recliner   Standing balance support: Bilateral upper extremity supported, During functional activity, Reliant on assistive device for balance Standing balance-Leahy Scale: Poor Standing balance comment: reliant on external support                             Pertinent Vitals/Pain Pain Assessment Pain Assessment: Faces Faces Pain Scale: Hurts little more Pain Location: neck Pain Descriptors / Indicators: Burning, Constant, Discomfort Pain  Intervention(s): Limited activity within patient's tolerance, Monitored during session    Home Living Family/patient expects to be discharged to:: Private residence Living Arrangements: Alone Available Help at Discharge:  Family;Available PRN/intermittently;Neighbor (daughter lives 10 mins away) Type of Home: House Home Access: Stairs to enter Entrance Stairs-Rails: Right Entrance Stairs-Number of Steps: 1   Home Layout: One level Home Equipment: BSC/3in1;Rollator (4 wheels);Shower seat;Grab bars - toilet;Grab bars - tub/shower;Wheelchair - manual      Prior Function Prior Level of Function : Independent/Modified Independent;History of Falls (last six months) (1 fall w/out an AD)             Mobility Comments: Pt ambulates utilizing rollator within the house and in the community. Pt requires physical assist for getting rollator in and out of car. ADLs Comments: Indep with ADLs. Pt manages own finances and medications. Daughter brings groceries to patient.     Extremity/Trunk Assessment   Upper Extremity Assessment Upper Extremity Assessment: Generalized weakness    Lower Extremity Assessment Lower Extremity Assessment: Generalized weakness    Cervical / Trunk Assessment Cervical / Trunk Assessment: Kyphotic  Communication   Communication Communication: No apparent difficulties    Cognition Arousal: Alert Behavior During Therapy: WFL for tasks assessed/performed   PT - Cognitive impairments: No apparent impairments                         Following commands: Intact       Cueing Cueing Techniques: Verbal cues     General Comments General comments (skin integrity, edema, etc.): SpO2 100% at rest and 93% when ambulating, HR: 61-64 bpm    Exercises     Assessment/Plan    PT Assessment Patient needs continued PT services  PT Problem List Decreased strength;Decreased activity tolerance;Decreased range of motion;Decreased balance;Decreased mobility;Decreased coordination;Pain       PT Treatment Interventions DME instruction;Gait training;Stair training;Functional mobility training;Therapeutic activities;Therapeutic exercise;Balance training;Patient/family  education;Manual techniques;Modalities    PT Goals (Current goals can be found in the Care Plan section)  Acute Rehab PT Goals Patient Stated Goal: to go home PT Goal Formulation: With patient Time For Goal Achievement: 10/26/24 Potential to Achieve Goals: Good    Frequency Min 1X/week     Co-evaluation               AM-PAC PT 6 Clicks Mobility  Outcome Measure Help needed turning from your back to your side while in a flat bed without using bedrails?: A Little Help needed moving from lying on your back to sitting on the side of a flat bed without using bedrails?: A Little Help needed moving to and from a bed to a chair (including a wheelchair)?: A Little Help needed standing up from a chair using your arms (e.g., wheelchair or bedside chair)?: A Little Help needed to walk in hospital room?: A Little Help needed climbing 3-5 steps with a railing? : Total 6 Click Score: 16    End of Session Equipment Utilized During Treatment: Gait belt Activity Tolerance: Patient tolerated treatment well Patient left: in chair;with call bell/phone within reach Nurse Communication: Mobility status PT Visit Diagnosis: Other abnormalities of gait and mobility (R26.89);Muscle weakness (generalized) (M62.81);Pain Pain - Right/Left: Right Pain - part of body: Knee    Time: 9171-9146 PT Time Calculation (min) (ACUTE ONLY): 25 min   Charges:   PT Evaluation $PT Eval Low Complexity: 1 Low   PT General Charges $$ ACUTE PT VISIT: 1 Visit  Leontine Hilt DPT Acute Rehab Services [phone] Prefer contact via chat   Leontine NOVAK Sofie Schendel 10/12/2024, 10:17 AM

## 2024-10-12 NOTE — Progress Notes (Signed)
 Mobility Specialist Progress Note:    10/12/24 1258  Mobility  Activity Pivoted/transferred from chair to bed  Level of Assistance Standby assist, set-up cues, supervision of patient - no hands on  Assistive Device None  Distance Ambulated (ft) 3 ft  Range of Motion/Exercises Active  Activity Response Tolerated well  Mobility Referral Yes  Mobility visit 1 Mobility  Mobility Specialist Start Time (ACUTE ONLY) 1258  Mobility Specialist Stop Time (ACUTE ONLY) 1307  Mobility Specialist Time Calculation (min) (ACUTE ONLY) 9 min   Received pt requesting assistance to transfer and get back into bed. No c/o any symptoms. Pt able to do most of the transferring on own w/ SB but needed assistance getting legs in bed. Returned pt to bed w/ all needs met.   Venetia Keel Mobility Specialist Please Neurosurgeon or Rehab Office at [phone]

## 2024-10-12 NOTE — Progress Notes (Signed)
" °   10/12/24 1110  Assess: MEWS Score  Temp (!) 97.4 F (36.3 C)  BP 117/66  MAP (mmHg) 81  ECG Heart Rate (!) 49  Resp (!) 26  Level of Consciousness Alert  SpO2 95 %  O2 Device Room Air  Assess: MEWS Score  MEWS Temp 0  MEWS Systolic 0  MEWS Pulse 1  MEWS RR 2  MEWS LOC 0  MEWS Score 3  MEWS Score Color Yellow  Assess: if the MEWS score is Yellow or Red  Were vital signs accurate and taken at a resting state? Yes  Notify: Charge Nurse/RN  Name of Charge Nurse/RN Notified Charolette Peak  Provider Notification  Provider Name/Title Dr. Fairy. MD  Date Provider Notified 10/12/24  Time Provider Notified 0945  Method of Notification Face-to-face;Rounds  Notification Reason Other (Comment) (Baseline low)  Provider response See new orders  Date of Provider Response 10/12/24  Time of Provider Response 0945  Notify: Rapid Response  Name of Rapid Response RN Notified n/a  Assess: SIRS CRITERIA  SIRS Temperature  0  SIRS Respirations  1  SIRS Pulse 0  SIRS WBC 0  SIRS Score Sum  1    "

## 2024-10-12 NOTE — Consult Note (Addendum)
 "  Cardiology Consultation   Patient ID: Alisha Terrell MRN: [id number]; DOB: 08-03-1941  Admit date: 10/10/2024 Date of Consult: 10/12/2024  PCP:  Lazoff, Shawn P, DO   [facility] Providers Cardiologist:  Soyla DELENA Merck, MD        Patient Profile: Alisha Terrell is a 83 y.o. female with a hx of hypertension, TIA, asthma, obesity, OSA on CPAP, and paroxysmal atrial fibrillation on Eliquis  who is being seen 10/12/2024 for the evaluation of atrial fibrillation at the request of [name].  History of Present Illness: [name] is an 83 year old female with prior cardiac history listed below.  On 2/25 the patient was admitted to the hospital for symptoms of CHF.  A TTE was done and showed a normal LVEF with no RWMA.  She was diuresed and discharged at a weight of 165 pounds.  She was started on Farxiga , Aldactone , and torsemide .  At clinic follow-up on 12/2023 the patient reported that she was doing better.  She reported her blood pressure log that showed her blood pressures were elevated.  Her metoprolol  was increased to 200 mg daily.  Patient was last seen in the clinic on 02/2024.  At that time she was felt to be stable.  Her medications at that time included losartan , Cardizem , metoprolol , torsemide , potassium, Aldactone , Farxiga , Eliquis , and amiodarone .  Patient presented to the hospital for shortness of breath and lower extremity edema.  The patient was started on IV Lasix .  On interview the patient reported she came to the hospital for shortness of breath, lower extremity edema, and abdominal distention.  Stated that she had not been following her diet as closely over Christmas.  Reported that she was also having some weight gain.  Denies any alcohol use, nicotine use, illicit substance use.  Denies any chest pain.  Labs showed potassium of 3.4, creatinine of 0.95, calcium  of 8.7, magnesium  of 2.0, elevated proBNP of 4030, WBC count of 7.1, hemoglobin of  11.7, TSH of 1.51.  EKG showed normal sinus rhythm with a rate of 68, first-degree AV block, right bundle branch block, and inferior ST depression.  Chest x-ray showed Mild pulmonary edema with interstitial prominence.   Past Medical History:  Diagnosis Date   ARF (acute renal failure)    2020  resolved  it was presrnt when she was septic   Arthritis    Asthma    CHF (congestive heart failure) (HCC)    Chronic back pain    Class 2 obesity due to excess calories with body mass index (BMI) of 35.0 to 35.9 in adult    Dyspnea    ocassional   GERD (gastroesophageal reflux disease)    Headache    Hypertension    OSA (obstructive sleep apnea)    PAF (paroxysmal atrial fibrillation) (HCC)    TIA (transient ischemic attack) 2018   no resedule    Past Surgical History:  Procedure Laterality Date   ABDOMINAL SURGERY     CESAREAN SECTION     CHOLECYSTECTOMY     LESION EXCISION WITH COMPLEX REPAIR Left 07/25/2022   Procedure: LESION EXCISION OF LEFT BUCCAL MUCOSA;  Surgeon: Llewellyn Gerard DELENA, DO;  Location: [facility] OR;  Service: ENT;  Laterality: Left;     Home Medications:  Prior to Admission medications  Medication Sig Start Date End Date Taking? Authorizing Provider  acetaminophen  (TYLENOL ) 325 MG tablet Take 650 mg by mouth at bedtime as needed for mild pain (pain score 1-3) or moderate  pain (pain score 4-6).   Yes [provider]  acetaminophen  (TYLENOL ) 500 MG tablet Take 1,000 mg by mouth 2 (two) times daily.   Yes [provider]  ADVAIR DISKUS 250-50 MCG/DOSE AEPB Inhale 1 puff into the lungs 2 (two) times daily.  08/22/15  Yes [provider]  amiodarone  (PACERONE ) 200 MG tablet Take 1 tablet (200 mg total) by mouth daily. 02/22/24  Yes Acharya, Gayatri A, MD  apixaban  (ELIQUIS ) 5 MG TABS tablet Take 1 tablet (5 mg total) by mouth 2 (two) times daily. 08/02/24  Yes Acharya, Gayatri A, MD  Azelastine  HCl 137 MCG/SPRAY SOLN Place 1 spray into both nostrils  daily as needed (allergies). 03/12/22  Yes [provider]  calcium  carbonate (TUMS - DOSED IN MG ELEMENTAL CALCIUM ) 500 MG chewable tablet Chew 500-1,000 mg by mouth 3 (three) times daily as needed for indigestion or heartburn.   Yes [provider]  Dextromethorphan -Guaifenesin  (ROBITUSSIN COUGH/CHEST DM MAX PO) Take 15 mLs by mouth at bedtime as needed (for cold symptoms).   Yes [provider]  diclofenac  Sodium (VOLTAREN ) 1 % GEL Apply 1 Application topically 2 (two) times daily as needed (pain). 08/16/19  Yes [provider]  diltiazem  (CARDIZEM  CD) 300 MG 24 hr capsule Take 300 mg by mouth daily. 09/14/23  Yes [provider]  fluticasone  (FLONASE ) 50 MCG/ACT nasal spray Place 1 spray into both nostrils daily as needed for allergies. 09/26/15  Yes [provider]  ibandronate  (BONIVA ) 150 MG tablet Take 150 mg by mouth every 30 (thirty) days.  08/16/19  Yes [provider]  Lido-Capsaicin-Men-Methyl Sal (MEDI-PATCH-LIDOCAINE  EX) Apply 1 patch topically daily as needed (pain).   Yes [provider]  losartan  (COZAAR ) 100 MG tablet Take 1 tablet (100 mg total) by mouth daily. 01/12/24  Yes Acharya, Gayatri A, MD  melatonin 5 MG TABS Take 5 mg by mouth at bedtime.   Yes [provider]  metoprolol  (TOPROL -XL) 200 MG 24 hr tablet TAKE 1 TABLET BY MOUTH DAILY TAKE WITH OR IMMEDIATLEY FOLLOWING A MEAL 04/29/24  Yes Cleaver, Josefa HERO, NP  montelukast  (SINGULAIR ) 10 MG tablet Take 10 mg by mouth at bedtime. 09/10/15  Yes [provider]  Multiple Vitamins-Minerals (CENTRUM SILVER ADULT 50+ PO) Take 1 tablet by mouth in the morning.   Yes [provider]  omeprazole (PRILOSEC) 40 MG capsule Take 40 mg by mouth in the morning and at bedtime.  09/10/15  Yes [provider]  PROAIR  HFA 108 (90 Base) MCG/ACT inhaler Inhale 1-2 puffs into the lungs daily as needed for wheezing or shortness of breath. 10/05/15   Yes [provider]  traMADol  (ULTRAM ) 50 MG tablet Take 50 mg by mouth in the morning and at bedtime. 03/23/19  Yes [provider]  furosemide  (LASIX ) 20 MG tablet Take 20 mg by mouth daily. Patient not taking: Reported on 10/12/2024    [provider]  [Paused] torsemide  (DEMADEX ) 20 MG tablet Take 1 tablet (20 mg total) by mouth daily. Patient not taking: Reported on 10/12/2024 Wait to take this until your doctor or other care provider tells you to start again. 12/04/23   Goodrich, Callie E, PA-C    Scheduled Meds:  amiodarone   200 mg Oral Daily   apixaban   5 mg Oral BID   azelastine   1 spray Each Nare Daily   diclofenac  Sodium  2 g Topical QHS   fluticasone  furoate-vilanterol  1 puff Inhalation Daily   furosemide   40 mg Intravenous BID   [START ON 10/13/2024] losartan   25 mg Oral Daily   [START ON 10/13/2024] metoprolol   100 mg Oral Daily   montelukast   10 mg Oral QHS   pantoprazole   40 mg Oral BID   potassium chloride   40 mEq Oral BID   sodium chloride  flush  3 mL Intravenous Q12H   Continuous Infusions:  PRN Meds: acetaminophen  **OR** acetaminophen , albuterol , alum & mag hydroxide-simeth, hydrALAZINE, melatonin, ondansetron  (ZOFRAN ) IV, trimethobenzamide  Allergies:   Allergies[1]  Social History:   Social History   Socioeconomic History   Marital status: Widowed    Spouse name: Jayson Burkes   Number of children: 2   Years of education: Not on file   Highest education level: Not on file  Occupational History   Occupation: retired  Tobacco Use   Smoking status: Never    Passive exposure: Yes   Smokeless tobacco: Never  Vaping Use   Vaping status: Never Used  Substance and Sexual Activity   Alcohol use: Yes    Alcohol/week: 3.0 standard drinks of alcohol    Types: 3 Glasses of wine per week    Comment: rare   Drug use: No   Sexual activity: Not on file  Other Topics Concern   Not on file  Social History Narrative   Patient is  right-handed. She lives with her husband in a one level home. She does not exercise.   Social Drivers of Health   Tobacco Use: Medium Risk (10/10/2024)   Patient History    Smoking Tobacco Use: Never    Smokeless Tobacco Use: Never    Passive Exposure: Yes  Financial Resource Strain: Patient Unable To Answer (04/26/2024)   Received from [facility]   Overall Financial Resource Strain (CARDIA)    How hard is it for you to pay for the very basics like food, housing, medical care, and heating?: Patient unable to answer  Food Insecurity: No Food Insecurity (10/11/2024)   Epic    Worried About Programme Researcher, Broadcasting/film/video in the Last Year: Never true    Ran Out of Food in the Last Year: Never true  Transportation Needs: No Transportation Needs (10/11/2024)   Epic    Lack of Transportation (Medical): No    Lack of Transportation (Non-Medical): No  Physical Activity: Not on file  Stress: Not on file  Social Connections: Moderately Isolated (10/11/2024)   Social Connection and Isolation Panel    Frequency of Communication with Friends and Family: More than three times a week    Frequency of Social Gatherings with Friends and Family: More than three times a week    Attends Religious Services: More than 4 times per year    Active Member of [employer] or Organizations: No    Attends Banker Meetings: Never    Marital Status: Widowed  Intimate Partner Violence: Not At Risk (10/11/2024)   Epic    Fear of Current or Ex-Partner: No    Emotionally Abused: No    Physically Abused: No    Sexually Abused: No  Depression (PHQ2-9): Not on file  Alcohol Screen: Not on file  Housing: Low Risk (10/11/2024)   Epic    Unable to Pay for Housing in the Last Year: No    Number of Times Moved in the Last Year: 0    Homeless in the Last Year: No  Utilities: Not At Risk (10/11/2024)   Epic    Threatened with loss of utilities: No  Health Literacy: Not on file    Family History:    Family History   Problem Relation Age of Onset   Hypertension Mother 47   Pneumonia Father 45     ROS:  Please see the history of present illness.   All other ROS reviewed and negative.     Physical Exam/Data: Vitals:   10/12/24 0359 10/12/24 0710 10/12/24 0841 10/12/24 1110  BP: (!) 148/65 (!) 153/64 (!) 141/67 117/66  Pulse: 60 61 (!) 53 100  Resp: (!) 21 19 18  (!) 26  Temp: 98.3 F (36.8 C) (!) 97.5 F (36.4 C) 97.7 F (36.5 C) (!) 97.4 F (36.3 C)  TempSrc: Oral Oral Oral Oral  SpO2: 97% 99% 93% 95%  Weight: 70.8 kg     Height:        Intake/Output Summary (Last 24 hours) at 10/12/2024 1401 Last data filed at 10/12/2024 1316 Gross per 24 hour  Intake 1140 ml  Output 500 ml  Net 640 ml      10/12/2024    3:59 AM 10/11/2024   12:00 PM 10/10/2024    4:57 PM  Last 3 Weights  Weight (lbs) 156 lb 156 lb 8.4 oz 166 lb  Weight (kg) 70.761 kg 71 kg 75.297 kg     Body mass index is 33.76 kg/m.  General:  Well nourished, well developed, in no acute distress HEENT: normal Neck: Positive JVD Vascular: No carotid bruits; Distal pulses 2+ bilaterally Cardiac:  normal S1, S2; RRR; no murmur. Lungs: Positive bibasilar crackles. Abd: soft, nontender, no hepatomegaly  Ext: no edema Musculoskeletal:  No deformities. Skin: warm and dry  Neuro:  no focal abnormalities noted Psych:  Normal affect   EKG:  The EKG was personally reviewed and demonstrates:  normal sinus rhythm with a rate of 68, first-degree AV block, right bundle branch block, and inferior ST depression. Telemetry:  Telemetry was personally reviewed and demonstrates: Normal sinus rhythm with heart rates in the 40s to 50s.  Relevant CV Studies:  TTE on 10/11/2024 IMPRESSIONS     1. Left ventricular ejection fraction, by estimation, is 60 to 65%. Left  ventricular ejection fraction by 2D MOD biplane is 62.1 %. The left  ventricle has normal function. The left ventricle has no regional wall  motion abnormalities. There  is mild  concentric left ventricular hypertrophy. Left ventricular diastolic  parameters are consistent with Grade II diastolic dysfunction  (pseudonormalization). Elevated left atrial pressure. The E/e' is 19.   2. Right ventricular systolic function is normal. The right ventricular  size is mildly enlarged. Tricuspid regurgitation signal is inadequate for  assessing PA pressure.   3. The mitral valve is normal in structure. Mild mitral valve  regurgitation. No evidence of mitral stenosis.   4. The aortic valve is tricuspid. Aortic valve regurgitation is not  visualized. No aortic stenosis is present.   5. Aortic dilatation noted. There is dilatation of the ascending aorta,  measuring 42 mm.   6. The inferior vena cava is dilated in size with >50% respiratory  variability, suggesting right atrial pressure of 8 mmHg.   7. Pleural effusion noted.    Laboratory Data: High Sensitivity Troponin:  No results for input(s): TROPONINIHS in the last 720 hours. No results for input(s): TRNPT in the last 720 hours.    Chemistry Recent Labs  Lab 10/10/24 1914 10/11/24 0442 10/12/24 0346  NA 136 138 138  K 4.2 3.4* 3.4*  CL 100 102 100  CO2 23  24 25  GLUCOSE 126* 104* 97  BUN 15 11 16   CREATININE 0.78 0.70 0.95  CALCIUM  9.3 9.2 8.7*  MG  --  2.0  --   GFRNONAA >60 >60 59*  ANIONGAP 13 12 13     Recent Labs  Lab 10/11/24 0442  PROT 6.9  ALBUMIN 4.2  AST 26  ALT 18  ALKPHOS 62  BILITOT 0.6   Lipids No results for input(s): CHOL, TRIG, HDL, LABVLDL, LDLCALC, CHOLHDL in the last 168 hours.  Hematology Recent Labs  Lab 10/10/24 1914 10/11/24 0442 10/12/24 0346  WBC 9.3 8.8 7.1  RBC 3.93 3.92 4.12  HGB 11.0* 11.0* 11.7*  HCT 34.1* 33.7* 34.2*  MCV 86.8 86.0 83.0  MCH 28.0 28.1 28.4  MCHC 32.3 32.6 34.2  RDW 15.7* 15.8* 15.5  PLT 296 255 293   Thyroid   Recent Labs  Lab 10/11/24 0442  TSH 1.510    BNP Recent Labs  Lab 10/10/24 1921  PROBNP 4,030.0*     DDimer No results for input(s): DDIMER in the last 168 hours.  Radiology/Studies:  ECHOCARDIOGRAM COMPLETE Result Date: 10/11/2024    ECHOCARDIOGRAM REPORT   Patient Name:   LEATHIA FARNELL Date of Exam: 10/11/2024 Medical Rec #:  [id number]          Height:       57.0 in Accession #:    [id number]         Weight:       166.0 lb Date of Birth:  [birth year]          BSA:          1.662 m Patient Age:    83 years           BP:           166/88 mmHg Patient Gender: F                  HR:           64 bpm. Exam Location:  Inpatient Procedure: 2D Echo, Cardiac Doppler and Color Doppler (Both Spectral and Color            Flow Doppler were utilized during procedure). Indications:    I50.33 Acute on chronic diastolic (congestive) heart failure  History:        Patient has prior history of Echocardiogram examinations, most                 recent 12/01/2023. CHF, Abnormal ECG, TIA and COPD,                 Arrythmias:Atrial Fibrillation, Signs/Symptoms:Bacteremia; Risk                 Factors:Sleep Apnea and Hypertension.  Sonographer:    Ellouise Mose RDCS Referring Phys: [id number] RONDELL A SMITH IMPRESSIONS  1. Left ventricular ejection fraction, by estimation, is 60 to 65%. Left ventricular ejection fraction by 2D MOD biplane is 62.1 %. The left ventricle has normal function. The left ventricle has no regional wall motion abnormalities. There is mild concentric left ventricular hypertrophy. Left ventricular diastolic parameters are consistent with Grade II diastolic dysfunction (pseudonormalization). Elevated left atrial pressure. The E/e' is 19.  2. Right ventricular systolic function is normal. The right ventricular size is mildly enlarged. Tricuspid regurgitation signal is inadequate for assessing PA pressure.  3. The mitral valve is normal in structure. Mild mitral valve regurgitation. No evidence of mitral stenosis.  4. The aortic valve is tricuspid.  Aortic valve regurgitation is not visualized. No aortic  stenosis is present.  5. Aortic dilatation noted. There is dilatation of the ascending aorta, measuring 42 mm.  6. The inferior vena cava is dilated in size with >50% respiratory variability, suggesting right atrial pressure of 8 mmHg.  7. Pleural effusion noted. FINDINGS  Left Ventricle: Left ventricular ejection fraction, by estimation, is 60 to 65%. Left ventricular ejection fraction by 2D MOD biplane is 62.1 %. The left ventricle has normal function. The left ventricle has no regional wall motion abnormalities. The left ventricular internal cavity size was normal in size. There is mild concentric left ventricular hypertrophy. Left ventricular diastolic parameters are consistent with Grade II diastolic dysfunction (pseudonormalization). Elevated left atrial pressure. The E/e' is 73. Right Ventricle: The right ventricular size is mildly enlarged. No increase in right ventricular wall thickness. Right ventricular systolic function is normal. Tricuspid regurgitation signal is inadequate for assessing PA pressure. Left Atrium: Left atrial size was normal in size. Right Atrium: Right atrial size was normal in size. Pericardium: Pleural effusion noted. There is no evidence of pericardial effusion. Mitral Valve: Tiger stripes seen in MR signal, but unable to measure full envelope. The mitral valve is normal in structure. Mild mitral valve regurgitation. No evidence of mitral valve stenosis. Tricuspid Valve: The tricuspid valve is normal in structure. Tricuspid valve regurgitation is trivial. No evidence of tricuspid stenosis. Aortic Valve: The aortic valve is tricuspid. Aortic valve regurgitation is not visualized. No aortic stenosis is present. Pulmonic Valve: The pulmonic valve was normal in structure. Pulmonic valve regurgitation is not visualized. No evidence of pulmonic stenosis. Aorta: The aortic root and ascending aorta are structurally normal, with no evidence of dilitation and aortic dilatation noted. There is  dilatation of the ascending aorta, measuring 42 mm. Venous: A systolic blunting flow pattern is recorded from the right upper pulmonary vein. The inferior vena cava is dilated in size with greater than 50% respiratory variability, suggesting right atrial pressure of 8 mmHg. IAS/Shunts: No atrial level shunt detected by color flow Doppler.  LEFT VENTRICLE PLAX 2D                        Biplane EF (MOD) LVIDd:         4.17 cm         LV Biplane EF:   Left LVIDs:         3.20 cm                          ventricular LV PW:         1.37 cm                          ejection LV IVS:        1.37 cm                          fraction by LVOT diam:     2.10 cm                          2D MOD LV SV:         82                               biplane is  LV SV Index:   50                               62.1 %. LVOT Area:     3.46 cm LV IVRT:       88 msec         Diastology                                LV e' medial:    4.03 cm/s                                LV E/e' medial:  18.8 LV Volumes (MOD)               LV e' lateral:   11.20 cm/s LV vol d, MOD    90.1 ml       LV E/e' lateral: 6.8 A2C: LV vol d, MOD    107.0 ml A4C: LV vol s, MOD    38.6 ml A2C: LV vol s, MOD    38.9 ml A4C: LV SV MOD A2C:   51.5 ml LV SV MOD A4C:   107.0 ml LV SV MOD BP:    63.4 ml RIGHT VENTRICLE             IVC RV S prime:     16.40 cm/s  IVC diam: 2.20 cm TAPSE (M-mode): 2.0 cm                             PULMONARY VEINS                             Diastolic Velocity: 55.90 cm/s                             S/D Velocity:       0.90                             Systolic Velocity:  53.00 cm/s LEFT ATRIUM             Index        RIGHT ATRIUM           Index LA diam:        3.50 cm 2.11 cm/m   RA Area:     11.20 cm LA Vol (A2C):   44.2 ml 26.60 ml/m  RA Volume:   23.00 ml  13.84 ml/m LA Vol (A4C):   45.8 ml 27.56 ml/m LA Biplane Vol: 46.2 ml 27.80 ml/m  AORTIC VALVE LVOT Vmax:   123.00 cm/s LVOT Vmean:  77.100 cm/s LVOT VTI:    0.238 m  AORTA Ao  Root diam: 3.30 cm Ao Asc diam:  4.05 cm MITRAL VALVE MV Area (PHT): 3.21 cm    SHUNTS MV Decel Time: 236 msec    Systemic VTI:  0.24 m MV E velocity: 75.80 cm/s  Systemic Diam: 2.10 cm MV A velocity: 70.00 cm/s MV E/A ratio:  1.08 Dalton McleanMD Electronically signed by Ezra Kanner Signature Date/Time: 10/11/2024/4:57:03 PM    Final    DG Chest 2 View Result Date:  10/10/2024 EXAM: 2 VIEW(S) XRAY OF THE CHEST 10/10/2024 05:52:00 PM COMPARISON: 12/01/2023 CLINICAL HISTORY: shob FINDINGS: LUNGS AND PLEURA: Mild pulmonary edema. Interstitial prominence. No pleural effusion. No pneumothorax. HEART AND MEDIASTINUM: Cardiomegaly. Tortuous aorta with atherosclerosis. BONES AND SOFT TISSUES: Thoracic kyphosis. IMPRESSION: 1. Mild pulmonary edema with interstitial prominence. 2. Cardiomegaly with a tortuous aorta and atherosclerosis. Electronically signed by: Greig Pique MD 10/10/2024 08:36 PM EST RP Workstation: [id number]     Assessment and Plan: Alisha Terrell is a 83 y.o. female with a hx of hypertension, TIA, asthma, obesity, OSA on CPAP, and paroxysmal atrial fibrillation on Eliquis  who is being seen 10/12/2024 for the evaluation of atrial fibrillation at the request of [name].  Acute on chronic diastolic heart failure Presented to the hospital for worsening shortness of breath, lower extremity edema. Labs showed elevated proBNP of 4030. Chest x-ray showed Mild pulmonary edema with interstitial prominence.  On exam patient was slightly volume up. Patient reported that her shortness of breath has improved since being hospitalized.  I's and O's are net out 1.2 L.  Weight is 156 pounds. Ordered a dose of 80 mg IV Lasix .    Paroxysmal atrial fibrillation CHA2DS2-VASc Score = 7 [CHF History: 1, HTN History: 1, Diabetes History: 0, Stroke History: 2, Vascular Disease History: 0, Age Score: 2, Gender Score: 1].  Therefore, the patient's annual risk of stroke is 11.2 %.    Telemetry  showed normal sinus rhythm with heart rates in the high 40s and 50s.  Was previously on Cardizem  300 mg daily but this was held. Labs showed potassium of 3.4, creatinine of 0.95, magnesium  of 2.0. Has calcium  replacement Continue Eliquis  5 mg twice daily. (Correct dose) Continue amiodarone  200 mg daily. Continue metoprolol  succinate 100 mg daily.   Hypertension Blood pressures were initially elevated but last blood pressure was 117/66.  May consider increasing losartan  dose if blood pressure increases again. Continue losartan  25 mg daily. Continue metoprolol  succinate 100 mg daily.   OSA on CPAP Compliant   Otherwise management per primary   Risk Assessment/Risk Scores:     New [place]  Heart Association (NYHA) Functional Class NYHA Class II  CHA2DS2-VASc Score = 7   This indicates a 11.2% annual risk of stroke. The patient's score is based upon: CHF History: 1 HTN History: 1 Diabetes History: 0 Stroke History: 2 Vascular Disease History: 0 Age Score: 2 Gender Score: 1        For questions or updates, please contact [facility] Please consult [email or web address] for contact info under     Signed, Morse Clause, PA-C  10/12/2024 2:01 PM  Patient seen and examined   I agree with findings as noted by JENEANE Clause above  Pt is an 83 yo with hx of HTN, HFpEF, OSA, PAF   Pt presented to hosp with SOB  Admitted to maybe not following diet as closely over the holidays    CXR with mild pulmonary edema on admit   pro BNP 4030 The pt has been in SR since admit  She has diuresed 1.2 L and wt is down  On exam Pt in NAD Neck  JVP is increased Lungs with mild wheeze, rales at bases  Cardiac RRR  No S3   No murmurs  Ext with tr edema    1  HFpEF   Still with some volume on exam   I would give 80 lasix  tonight and follow I/O and renal function   2  Hx PAF  Pt came in on diltiazem  360, Toprol  XL 200 and amiodarone  200 every day     In SR    Dilt has been held    I would not  restart given amiodarone  and HR Follow on tele   3  HTN   Pt Toprol  XL 100 and losartan  25  (outpt list says she was on toprol  XL 200 and 360 dilt and 100 losartan ; question confusion For now would follow BP   Vina Gull MD     [1]  Allergies Allergen Reactions   Codeine Rash and Other (See Comments)    Agitation, bad dreams   Omnicef [Cefdinir] Nausea And Vomiting    Confirmed with patient at bedside that no rash - just had significant GI effects   "

## 2024-10-12 NOTE — Telephone Encounter (Signed)
 Pharmacy Patient Advocate Encounter  Insurance verification completed.    The patient is insured through [employer]. Patient has Medicare and is not eligible for a copay card, but [place] be able to apply for patient assistance or Medicare RX Payment Plan (Patient Must reach out to their plan, if eligible for payment plan), if available.    Ran test claim for Eliquis  5mg  tablet and the current 30 day co-pay is $0.   This test claim was processed through [employer]- copay amounts Sosinski vary at other pharmacies due to [employer], or as the patient moves through the different stages of their insurance plan.

## 2024-10-13 DIAGNOSIS — Z7189 Other specified counseling: Secondary | ICD-10-CM

## 2024-10-13 DIAGNOSIS — I10 Essential (primary) hypertension: Secondary | ICD-10-CM

## 2024-10-13 DIAGNOSIS — I5033 Acute on chronic diastolic (congestive) heart failure: Secondary | ICD-10-CM | POA: Diagnosis not present

## 2024-10-13 LAB — BASIC METABOLIC PANEL WITH GFR
Anion gap: 14 (ref 5–15)
BUN: 21 mg/dL (ref 8–23)
CO2: 21 mmol/L — ABNORMAL LOW (ref 22–32)
Calcium: 9.2 mg/dL (ref 8.9–10.3)
Chloride: 102 mmol/L (ref 98–111)
Creatinine, Ser: 1.11 mg/dL — ABNORMAL HIGH (ref 0.44–1.00)
GFR, Estimated: 49 mL/min — ABNORMAL LOW
Glucose, Bld: 140 mg/dL — ABNORMAL HIGH (ref 70–99)
Potassium: 4.1 mmol/L (ref 3.5–5.1)
Sodium: 138 mmol/L (ref 135–145)

## 2024-10-13 MED ORDER — TRAMADOL HCL 50 MG PO TABS
50.0000 mg | ORAL_TABLET | Freq: Four times a day (QID) | ORAL | Status: DC | PRN
  Administered 2024-10-13 (×2): 50 mg via ORAL
  Filled 2024-10-13 (×2): qty 1

## 2024-10-13 MED ORDER — TORSEMIDE 20 MG PO TABS: 20.0000 mg | ORAL_TABLET | Freq: Every day | ORAL | Status: DC

## 2024-10-13 NOTE — Progress Notes (Signed)
 "   Rounding Note    Patient Name: Alisha Terrell Date of Encounter: 10/13/2024  [facility] Cardiologist: Soyla DELENA Merck, MD   Subjective   Extensive history reviewed with the patient today. She notes that she has been off of her farxiga  and spironolactone  since shortly after her hospitalization in February (see below). She has not been on routine torsemide  in a long time. She weighs herself daily, and her weight had been very steady until just after Halloween. She notes that her weight would go up 1-2 lbs at a time, nothing more than that, so she was not concerned, but overall her weight was up about 10 lbs from her baseline at the time of admission. Today she notes that her leg swelling is significantly improved, as is her breathing. She was able to ambulate the hallway with the mobility team, did feel short of breath at the end but much better than yesterday. She does feel like she has been urinating a lot.   Inpatient Medications    Scheduled Meds:  amiodarone   200 mg Oral Daily   apixaban   5 mg Oral BID   azelastine   1 spray Each Nare Daily   diclofenac  Sodium  2 g Topical QHS   fluticasone  furoate-vilanterol  1 puff Inhalation Daily   losartan   25 mg Oral Daily   metoprolol   100 mg Oral Daily   montelukast   10 mg Oral QHS   pantoprazole   40 mg Oral BID   sodium chloride  flush  3 mL Intravenous Q12H   Continuous Infusions:  PRN Meds: acetaminophen  **OR** acetaminophen , albuterol , alum & mag hydroxide-simeth, hydrALAZINE, melatonin, ondansetron  (ZOFRAN ) IV, traMADol , trimethobenzamide   Vital Signs    Vitals:   10/13/24 0415 10/13/24 0700 10/13/24 0755 10/13/24 0809  BP: (!) 159/87  (!) 138/49   Pulse: 75  74   Resp: 20  19   Temp: 98.6 F (37 C)  (!) 97.5 F (36.4 C)   TempSrc: Oral Oral Oral   SpO2: 95%  91% 93%  Weight: 71.2 kg     Height:        Intake/Output Summary (Last 24 hours) at 10/13/2024 1246 Last data filed at 10/13/2024 0800 Gross per  24 hour  Intake 596 ml  Output 700 ml  Net -104 ml      10/13/2024    4:15 AM 10/12/2024    3:59 AM 10/11/2024   12:00 PM  Last 3 Weights  Weight (lbs) 156 lb 15.5 oz 156 lb 156 lb 8.4 oz  Weight (kg) 71.2 kg 70.761 kg 71 kg      Telemetry    SR with occasional PVCs, HR largely 70s - Personally Reviewed  Physical Exam   GEN: No acute distress.   Neck: JVD just at clavicle at 45 degrees Cardiac: RRR, no murmurs, rubs, or gallops.  Respiratory: Clear to auscultation bilaterally. GI: Soft, nontender, non-distended  MS: Trivial pitting LE edema at ankles bilaterally Neuro:  Nonfocal  Psych: Normal affect   New pertinent results (labs, ECG, imaging, cardiac studies)    Echo 10/11/24 personally reviewed, see below  Assessment & Plan    Acute on chronic diastolic heart failure -elevated proBNP, pulmonary edema on CXR, symptoms shortness of breath, LE edema, abdominal distension -avoid bradycardia and afib with diastolic heart failure, see below -blood pressure has been variable. Continue losartan  for now, consider change to entresto if BP allows. Per phone notes, has a [employer] grant from February to cover medication  costs -was on farxiga , spironolactone  at time of discharge 12/04/23. However, BMET after discharge showed K of 6.3 and AKI. Noted that she was also on potassium supplementation at that time. On follow up with Josefa [facility]ais in February and May, these were both still on her medication list, but ipatient reports being off of them since just after her discharge -was not on routine torsemide  at home -we discussed medications today. She appears nearly euvolemic. I would hold diuretic today, and if renal function stable, start dapagliflozin  tomorrow. We discussed the mechanism of this medication at length, watching for UTI/yeast infections. I suspect that her Cr rose after her hospitalization as she was on torsemide , potassium supplementation, spironolactone , and dapagliflozin   all as new starts. If she tolerates farxiga , I suspect only PRN diuretic would be needed for her. -as an outpatient, if she is tolerate farxiga  and K/Cr stable, could consider low dose of spironolactone  and uptitration with close monitoring of labs -Labs this AM show K 4.1, Cr up to 1.1 (was 0.78 on admission)  -has received two doses of IV lasix  40 mg and then one dose of IV lasix  80 mg 12/31 afternoon. Only 700 cc urine output yesterday. No lasix  currently ordered. With rising Cr, approaching euvolemia as above, no lasix  today -admission weight 71 kg, current weight 71.2 kg, charted net negative 1.1 L. Her last outpatient cardiology weight was 75.8 kg on 03/01/24. -echo 10/11/24 shows EF 60-65%, G2DD with elevated LVEDP, RAP 8. -discussed heart failure education at length  Paroxysmal atrial fibrillation -CHA2DS2/VAS Stroke Risk Points= 7 -continue apixaban  -on amiodarone , diltiazem , and metoprolol  as outpatient -with diastolic heart failure, avoid bradycardia and try to maintain sinus rhythm. Had heart rates into the upper 40s this admission. Agree with stopping diltiazem  and cutting metoprolol  dose, continue amiodarone   Hypertension -on losartan  as above, though current dose of 25 mg lower than her home dose of 100 mg. Consider changing to entresto vs adding spironolactone  as an outpatient with close monitoring of K/Cr  Overall, if Cr/K stable, would start farxiga  tomorrow, arrange for close outpatient followup with BMET, potential discharge tomorrow if clinically improving.  Total time of encounter: I spent 60 minutes dedicated to the care of this patient on the date of this encounter to include pre-visit review of records, face-to-face time with the patient discussing conditions above, and clinical documentation with the electronic health record. We specifically spent time today discussing heart failure, education, medications, history of meds, signs/symptoms to watch for.      Signed, Shelda Bruckner, MD  10/13/2024, 12:46 PM     "

## 2024-10-13 NOTE — Plan of Care (Signed)
  Problem: Health Behavior/Discharge Planning: Goal: Ability to manage health-related needs will improve Outcome: Progressing   Problem: Clinical Measurements: Goal: Will remain free from infection Outcome: Progressing   Problem: Clinical Measurements: Goal: Diagnostic test results will improve Outcome: Progressing   

## 2024-10-13 NOTE — Plan of Care (Signed)
  Problem: Education: Goal: Knowledge of General Education information will improve Description: Including pain rating scale, medication(s)/side effects and non-pharmacologic comfort measures Outcome: Progressing   Problem: Clinical Measurements: Goal: Will remain free from infection Outcome: Progressing Goal: Respiratory complications will improve Outcome: Progressing   Problem: Activity: Goal: Risk for activity intolerance will decrease Outcome: Progressing   Problem: Nutrition: Goal: Adequate nutrition will be maintained Outcome: Progressing   Problem: Coping: Goal: Level of anxiety will decrease Outcome: Progressing   Problem: Safety: Goal: Ability to remain free from injury will improve Outcome: Progressing

## 2024-10-13 NOTE — Progress Notes (Signed)
 " PROGRESS NOTE    VINEY Terrell  FMW:[id number] DOB: Jan 07, 1941 DOA: 10/10/2024 PCP: Lazoff, Shawn P, DO  84/F with obesity, hypertension, diastolic CHF, paroxysmal A-fib, asthma, chronic back pain presented to the ED with shortness of breath and leg swelling for 1 to 2 weeks.  Reports that she has been off diuretics/torsemide  for over 8-9 months after some abnormal blood work in February. In the ED, BP 198/82, proBNP 4030, chest x-ray with mild pulmonary edema and interstitial prominence  Subjective: - Feels better overall, breathing continues to improve  Assessment and Plan:  Acute on chronic diastolic CHF - Last echo 12/30 noted EF 60-65%, grade 2 DD, normal RV - Improving with diuresis, appears close to euvolemic, could transition to oral diuretics, will defer to cards - Misunderstood home meds and was off torsemide  for> 6 months, in addition I suspect A-fib is also likely contributing, fortunately back in sinus rhythm now - Continue ARB, decrease Toprol  dose to 100 mg  PAFib -in Afib at this time, HR was in 50-60 range on Amio, Toprol  200 and Cardizem  300mg  daily - Discontinued Cardizem , lowered Toprol  to 100 mg -continue eliquis  - Likely contributing to fluid overload as well, now back in sinus rhythm  Hypertensive urgency Improved, meds as above  Asthma - Stable, continue Advair, Singulair , albuterol  as needed  Normocytic anemia Stable, monitor  Obesity, BMI 37.2    DVT prophylaxis: Eliquis  Code Status: Full code Family Communication: None present Disposition Plan: Home likely tomorrow Consultants:    Procedures:   Antimicrobials:    Objective: Vitals:   10/13/24 0415 10/13/24 0700 10/13/24 0755 10/13/24 0809  BP: (!) 159/87  (!) 138/49   Pulse: 75  74   Resp: 20  19   Temp: 98.6 F (37 C)  (!) 97.5 F (36.4 C)   TempSrc: Oral Oral Oral   SpO2: 95%  91% 93%  Weight: 71.2 kg     Height:        Intake/Output Summary (Last 24 hours) at  10/13/2024 1048 Last data filed at 10/13/2024 0800 Gross per 24 hour  Intake 596 ml  Output 700 ml  Net -104 ml   Filed Weights   10/11/24 1200 10/12/24 0359 10/13/24 0415  Weight: 71 kg 70.8 kg 71.2 kg    Examination:  General exam: Appears calm and comfortable obese, no distress Respiratory system: Rare basilar rales Cardiovascular system: S1 & S2 heard, regular Abd: nondistended, soft and nontender.Normal bowel sounds heard. Central nervous system: Alert and oriented. No focal neurological deficits. Extremities: Trace edema Skin: No rashes Psychiatry:  Mood & affect appropriate.     Data Reviewed:   CBC: Recent Labs  Lab 10/10/24 1914 10/11/24 0442 10/12/24 0346  WBC 9.3 8.8 7.1  NEUTROABS  --  7.0  --   HGB 11.0* 11.0* 11.7*  HCT 34.1* 33.7* 34.2*  MCV 86.8 86.0 83.0  PLT 296 255 293   Basic Metabolic Panel: Recent Labs  Lab 10/10/24 1914 10/11/24 0442 10/12/24 0346 10/13/24 0930  NA 136 138 138 138  K 4.2 3.4* 3.4* 4.1  CL 100 102 100 102  CO2 23 24 25  21*  GLUCOSE 126* 104* 97 140*  BUN 15 11 16 21   CREATININE 0.78 0.70 0.95 1.11*  CALCIUM  9.3 9.2 8.7* 9.2  MG  --  2.0  --   --    GFR: Estimated Creatinine Clearance: 30.7 mL/min (A) (by C-G formula based on SCr of 1.11 mg/dL (H)). Liver Function Tests: Recent  Labs  Lab 10/11/24 0442  AST 26  ALT 18  ALKPHOS 62  BILITOT 0.6  PROT 6.9  ALBUMIN 4.2   No results for input(s): LIPASE, AMYLASE in the last 168 hours. No results for input(s): AMMONIA in the last 168 hours. Coagulation Profile: No results for input(s): INR, PROTIME in the last 168 hours. Cardiac Enzymes: No results for input(s): CKTOTAL, CKMB, CKMBINDEX, TROPONINI in the last 168 hours. BNP (last 3 results) Recent Labs    10/10/24 1921  PROBNP 4,030.0*   HbA1C: No results for input(s): HGBA1C in the last 72 hours. CBG: No results for input(s): GLUCAP in the last 168 hours. Lipid Profile: No results  for input(s): CHOL, HDL, LDLCALC, TRIG, CHOLHDL, LDLDIRECT in the last 72 hours. Thyroid  Function Tests: Recent Labs    10/11/24 0442  TSH 1.510   Anemia Panel: No results for input(s): VITAMINB12, FOLATE, FERRITIN, TIBC, IRON, RETICCTPCT in the last 72 hours. Urine analysis:    Component Value Date/Time   COLORURINE STRAW (A) 10/10/2024 1917   APPEARANCEUR CLEAR 10/10/2024 1917   LABSPEC 1.006 10/10/2024 1917   PHURINE 7.0 10/10/2024 1917   GLUCOSEU NEGATIVE 10/10/2024 1917   HGBUR NEGATIVE 10/10/2024 1917   BILIRUBINUR NEGATIVE 10/10/2024 1917   KETONESUR NEGATIVE 10/10/2024 1917   PROTEINUR NEGATIVE 10/10/2024 1917   NITRITE NEGATIVE 10/10/2024 1917   LEUKOCYTESUR NEGATIVE 10/10/2024 1917   Sepsis Labs: @LABRCNTIP (procalcitonin:4,lacticidven:4)  )No results found for this or any previous visit (from the past 240 hours).   Radiology Studies: ECHOCARDIOGRAM COMPLETE Result Date: 10/11/2024    ECHOCARDIOGRAM REPORT   Patient Name:   Alisha Terrell Date of Exam: 10/11/2024 Medical Rec #:  [id number]          Height:       57.0 in Accession #:    [id number]         Weight:       166.0 lb Date of Birth:  [birth year]          BSA:          1.662 m Patient Age:    84 years           BP:           166/88 mmHg Patient Gender: F                  HR:           64 bpm. Exam Location:  Inpatient Procedure: 2D Echo, Cardiac Doppler and Color Doppler (Both Spectral and Color            Flow Doppler were utilized during procedure). Indications:    I50.33 Acute on chronic diastolic (congestive) heart failure  History:        Patient has prior history of Echocardiogram examinations, most                 recent 12/01/2023. CHF, Abnormal ECG, TIA and COPD,                 Arrythmias:Atrial Fibrillation, Signs/Symptoms:Bacteremia; Risk                 Factors:Sleep Apnea and Hypertension.  Sonographer:    Ellouise Mose RDCS Referring Phys: [id number] RONDELL A SMITH IMPRESSIONS  1.  Left ventricular ejection fraction, by estimation, is 60 to 65%. Left ventricular ejection fraction by 2D MOD biplane is 62.1 %. The left ventricle has normal function. The left ventricle has no regional wall motion abnormalities. There  is mild concentric left ventricular hypertrophy. Left ventricular diastolic parameters are consistent with Grade II diastolic dysfunction (pseudonormalization). Elevated left atrial pressure. The E/e' is 19.  2. Right ventricular systolic function is normal. The right ventricular size is mildly enlarged. Tricuspid regurgitation signal is inadequate for assessing PA pressure.  3. The mitral valve is normal in structure. Mild mitral valve regurgitation. No evidence of mitral stenosis.  4. The aortic valve is tricuspid. Aortic valve regurgitation is not visualized. No aortic stenosis is present.  5. Aortic dilatation noted. There is dilatation of the ascending aorta, measuring 42 mm.  6. The inferior vena cava is dilated in size with >50% respiratory variability, suggesting right atrial pressure of 8 mmHg.  7. Pleural effusion noted. FINDINGS  Left Ventricle: Left ventricular ejection fraction, by estimation, is 60 to 65%. Left ventricular ejection fraction by 2D MOD biplane is 62.1 %. The left ventricle has normal function. The left ventricle has no regional wall motion abnormalities. The left ventricular internal cavity size was normal in size. There is mild concentric left ventricular hypertrophy. Left ventricular diastolic parameters are consistent with Grade II diastolic dysfunction (pseudonormalization). Elevated left atrial pressure. The E/e' is 47. Right Ventricle: The right ventricular size is mildly enlarged. No increase in right ventricular wall thickness. Right ventricular systolic function is normal. Tricuspid regurgitation signal is inadequate for assessing PA pressure. Left Atrium: Left atrial size was normal in size. Right Atrium: Right atrial size was normal in size.  Pericardium: Pleural effusion noted. There is no evidence of pericardial effusion. Mitral Valve: Tiger stripes seen in MR signal, but unable to measure full envelope. The mitral valve is normal in structure. Mild mitral valve regurgitation. No evidence of mitral valve stenosis. Tricuspid Valve: The tricuspid valve is normal in structure. Tricuspid valve regurgitation is trivial. No evidence of tricuspid stenosis. Aortic Valve: The aortic valve is tricuspid. Aortic valve regurgitation is not visualized. No aortic stenosis is present. Pulmonic Valve: The pulmonic valve was normal in structure. Pulmonic valve regurgitation is not visualized. No evidence of pulmonic stenosis. Aorta: The aortic root and ascending aorta are structurally normal, with no evidence of dilitation and aortic dilatation noted. There is dilatation of the ascending aorta, measuring 42 mm. Venous: A systolic blunting flow pattern is recorded from the right upper pulmonary vein. The inferior vena cava is dilated in size with greater than 50% respiratory variability, suggesting right atrial pressure of 8 mmHg. IAS/Shunts: No atrial level shunt detected by color flow Doppler.  LEFT VENTRICLE PLAX 2D                        Biplane EF (MOD) LVIDd:         4.17 cm         LV Biplane EF:   Left LVIDs:         3.20 cm                          ventricular LV PW:         1.37 cm                          ejection LV IVS:        1.37 cm                          fraction by LVOT diam:  2.10 cm                          2D MOD LV SV:         82                               biplane is LV SV Index:   50                               62.1 %. LVOT Area:     3.46 cm LV IVRT:       88 msec         Diastology                                LV e' medial:    4.03 cm/s                                LV E/e' medial:  18.8 LV Volumes (MOD)               LV e' lateral:   11.20 cm/s LV vol d, MOD    90.1 ml       LV E/e' lateral: 6.8 A2C: LV vol d, MOD    107.0 ml A4C: LV  vol s, MOD    38.6 ml A2C: LV vol s, MOD    38.9 ml A4C: LV SV MOD A2C:   51.5 ml LV SV MOD A4C:   107.0 ml LV SV MOD BP:    63.4 ml RIGHT VENTRICLE             IVC RV S prime:     16.40 cm/s  IVC diam: 2.20 cm TAPSE (M-mode): 2.0 cm                             PULMONARY VEINS                             Diastolic Velocity: 55.90 cm/s                             S/D Velocity:       0.90                             Systolic Velocity:  53.00 cm/s LEFT ATRIUM             Index        RIGHT ATRIUM           Index LA diam:        3.50 cm 2.11 cm/m   RA Area:     11.20 cm LA Vol (A2C):   44.2 ml 26.60 ml/m  RA Volume:   23.00 ml  13.84 ml/m LA Vol (A4C):   45.8 ml 27.56 ml/m LA Biplane Vol: 46.2 ml 27.80 ml/m  AORTIC VALVE LVOT Vmax:   123.00 cm/s LVOT Vmean:  77.100 cm/s LVOT VTI:    0.238 m  AORTA Ao Root diam: 3.30 cm Ao Asc  diam:  4.05 cm MITRAL VALVE MV Area (PHT): 3.21 cm    SHUNTS MV Decel Time: 236 msec    Systemic VTI:  0.24 m MV E velocity: 75.80 cm/s  Systemic Diam: 2.10 cm MV A velocity: 70.00 cm/s MV E/A ratio:  1.08 Dalton McleanMD Electronically signed by Ezra Kanner Signature Date/Time: 10/11/2024/4:57:03 PM    Final      Scheduled Meds:  amiodarone   200 mg Oral Daily   apixaban   5 mg Oral BID   azelastine   1 spray Each Nare Daily   diclofenac  Sodium  2 g Topical QHS   fluticasone  furoate-vilanterol  1 puff Inhalation Daily   losartan   25 mg Oral Daily   metoprolol   100 mg Oral Daily   montelukast   10 mg Oral QHS   pantoprazole   40 mg Oral BID   sodium chloride  flush  3 mL Intravenous Q12H   Continuous Infusions:   LOS: 2 days    Time spent:    Sigurd Pac, MD [facility]   10/13/2024, 10:48 AM    "

## 2024-10-13 NOTE — Progress Notes (Signed)
 Mobility Specialist: Progress Note   10/13/24 0900  Mobility  Activity Ambulated with assistance  Level of Assistance Standby assist, set-up cues, supervision of patient - no hands on  Assistive Device Four wheel walker  Distance Ambulated (ft) 100 ft  Activity Response Tolerated well  Mobility Referral Yes  Mobility visit 1 Mobility  Mobility Specialist Start Time (ACUTE ONLY) [id number]  Mobility Specialist Stop Time (ACUTE ONLY) 0848  Mobility Specialist Time Calculation (min) (ACUTE ONLY) 10 min    Pt received in chair, pleasant and agreeable to mobility session. SV throughout. No complaints. SpO2 91% on RA. Returned to room. Feeling much better after the walk. Left in chair with all needs met, call bell in reach.   Ileana Lute Mobility Specialist Please contact via SecureChat or Rehab office at [phone]

## 2024-10-13 NOTE — Progress Notes (Signed)
 Physical Therapy Treatment Patient Details Name: Alisha Terrell MRN: [id number] DOB: 1941/06/09 Today's Date: 10/13/2024   History of Present Illness Pt is an 84 y.o. F presenting to [facility] on 10/10/24 with SOB and leg swelling. Pt admitted with CHF exacerbation. PMH is significant for HTN, HF, A-fib, asthma, GERD, OSA, and obesity.    PT Comments  Pt received in supine and agreeable to session. Pt demonstrates improved activity tolerance this session with increased gait distance. Pt requires 1 standing rest break, but no seated rest breaks. Pt continues to be limited by fatigue, so education provided on reducing fall risk and activity progression for improved endurance and strength. Pt encouraged to have [facility] therapist practice floor transfers due to fall history. Pt continues to benefit from PT services to progress toward functional mobility goals.     If plan is discharge home, recommend the following: A little help with walking and/or transfers;A little help with bathing/dressing/bathroom;Assistance with cooking/housework;Assist for transportation;Help with stairs or ramp for entrance   Can travel by private vehicle        Equipment Recommendations  None recommended by PT    Recommendations for Other Services       Precautions / Restrictions Precautions Precautions: Fall Recall of Precautions/Restrictions: Intact Restrictions Weight Bearing Restrictions Per Provider Order: No     Mobility  Bed Mobility Overal bed mobility: Needs Assistance Bed Mobility: Supine to Sit, Sit to Supine     Supine to sit: HOB elevated, Used rails, Supervision Sit to supine: Supervision, HOB elevated   General bed mobility comments: use of bed features and increased time    Transfers Overall transfer level: Needs assistance Equipment used: Rollator (4 wheels) Transfers: Sit to/from Stand Sit to Stand: Supervision           General transfer comment: cues to lock brakes     Ambulation/Gait Ambulation/Gait assistance: Contact guard assist, Supervision Gait Distance (Feet): 200 Feet Assistive device: Rollator (4 wheels) Gait Pattern/deviations: Step-through pattern, Decreased stride length, Decreased weight shift to right, Antalgic, Trunk flexed Gait velocity: reduced     General Gait Details: Effortful steps due to weakness and R hip pain. Cues for upright posture and CGA progressing to supervision for safety. 1 standing rest break due to fatigue   Stairs             Wheelchair Mobility     Tilt Bed    Modified Rankin (Stroke Patients Only)       Balance Overall balance assessment: Needs assistance Sitting-balance support: No upper extremity supported, Feet supported Sitting balance-Leahy Scale: Good Sitting balance - Comments: EOB   Standing balance support: Bilateral upper extremity supported, During functional activity, Reliant on assistive device for balance Standing balance-Leahy Scale: Poor Standing balance comment: reliant on rollator support                            Communication Communication Communication: No apparent difficulties  Cognition Arousal: Alert Behavior During Therapy: WFL for tasks assessed/performed   PT - Cognitive impairments: No apparent impairments                         Following commands: Intact      Cueing Cueing Techniques: Verbal cues  Exercises      General Comments General comments (skin integrity, edema, etc.): VSS on RA      Pertinent Vitals/Pain Pain Assessment Pain Assessment: Faces Faces Pain Scale:  Hurts little more Pain Location: chronic low back and hip pain Pain Descriptors / Indicators: Aching Pain Intervention(s): Monitored during session, Limited activity within patient's tolerance, Repositioned     PT Goals (current goals can now be found in the care plan section) Acute Rehab PT Goals Patient Stated Goal: to go home PT Goal Formulation: With  patient Time For Goal Achievement: 10/26/24 Progress towards PT goals: Progressing toward goals    Frequency    Min 1X/week       AM-PAC PT 6 Clicks Mobility   Outcome Measure  Help needed turning from your back to your side while in a flat bed without using bedrails?: A Little Help needed moving from lying on your back to sitting on the side of a flat bed without using bedrails?: A Little Help needed moving to and from a bed to a chair (including a wheelchair)?: A Little Help needed standing up from a chair using your arms (e.g., wheelchair or bedside chair)?: A Little Help needed to walk in hospital room?: A Little Help needed climbing 3-5 steps with a railing? : A Lot 6 Click Score: 17    End of Session Equipment Utilized During Treatment: Gait belt Activity Tolerance: Patient tolerated treatment well Patient left: in bed;with call bell/phone within reach;with bed alarm set Nurse Communication: Mobility status PT Visit Diagnosis: Other abnormalities of gait and mobility (R26.89);Muscle weakness (generalized) (M62.81);Pain Pain - Right/Left: Right Pain - part of body: Hip     Time: 8882-8864 PT Time Calculation (min) (ACUTE ONLY): 18 min  Charges:    $Gait Training: 8-22 mins PT General Charges $$ ACUTE PT VISIT: 1 Visit                    Darryle George, PTA Acute Rehabilitation Services Secure Chat Preferred  [phone]    Darryle George 10/13/2024, 12:26 PM

## 2024-10-14 ENCOUNTER — Other Ambulatory Visit: Payer: Self-pay

## 2024-10-14 ENCOUNTER — Telehealth (HOSPITAL_COMMUNITY): Payer: Self-pay

## 2024-10-14 ENCOUNTER — Other Ambulatory Visit (HOSPITAL_COMMUNITY): Payer: Self-pay

## 2024-10-14 DIAGNOSIS — I5033 Acute on chronic diastolic (congestive) heart failure: Secondary | ICD-10-CM | POA: Diagnosis not present

## 2024-10-14 LAB — BASIC METABOLIC PANEL WITH GFR
Anion gap: 11 (ref 5–15)
BUN: 21 mg/dL (ref 8–23)
CO2: 25 mmol/L (ref 22–32)
Calcium: 9.1 mg/dL (ref 8.9–10.3)
Chloride: 100 mmol/L (ref 98–111)
Creatinine, Ser: 1.01 mg/dL — ABNORMAL HIGH (ref 0.44–1.00)
GFR, Estimated: 55 mL/min — ABNORMAL LOW
Glucose, Bld: 101 mg/dL — ABNORMAL HIGH (ref 70–99)
Potassium: 4.1 mmol/L (ref 3.5–5.1)
Sodium: 136 mmol/L (ref 135–145)

## 2024-10-14 MED ORDER — LOSARTAN POTASSIUM 100 MG PO TABS
100.0000 mg | ORAL_TABLET | Freq: Every day | ORAL | 3 refills | Status: DC
  Filled 2024-10-14: qty 90, 90d supply, fill #0

## 2024-10-14 MED ORDER — DAPAGLIFLOZIN PROPANEDIOL 10 MG PO TABS
10.0000 mg | ORAL_TABLET | Freq: Every day | ORAL | Status: DC
  Filled 2024-10-14: qty 1

## 2024-10-14 MED ORDER — EMPAGLIFLOZIN 10 MG PO TABS
10.0000 mg | ORAL_TABLET | Freq: Every day | ORAL | 0 refills | Status: DC
  Filled 2024-10-14: qty 30, 30d supply, fill #0

## 2024-10-14 MED ORDER — FUROSEMIDE 20 MG PO TABS
20.0000 mg | ORAL_TABLET | ORAL | 0 refills | Status: DC | PRN
  Filled 2024-10-14: qty 30, 30d supply, fill #0

## 2024-10-14 MED ORDER — SACUBITRIL-VALSARTAN 49-51 MG PO TABS
1.0000 | ORAL_TABLET | Freq: Two times a day (BID) | ORAL | 0 refills | Status: DC
  Filled 2024-10-14: qty 60, 30d supply, fill #0

## 2024-10-14 MED ORDER — SACUBITRIL-VALSARTAN 49-51 MG PO TABS
1.0000 | ORAL_TABLET | Freq: Two times a day (BID) | ORAL | Status: DC
  Administered 2024-10-14: 1 via ORAL
  Filled 2024-10-14: qty 1

## 2024-10-14 MED ORDER — METOPROLOL SUCCINATE ER 100 MG PO TB24
100.0000 mg | ORAL_TABLET | Freq: Every day | ORAL | 0 refills | Status: DC
  Filled 2024-10-14: qty 30, 30d supply, fill #0

## 2024-10-14 MED ORDER — EMPAGLIFLOZIN 10 MG PO TABS
10.0000 mg | ORAL_TABLET | Freq: Every day | ORAL | Status: DC
  Administered 2024-10-14: 10 mg via ORAL
  Filled 2024-10-14: qty 1

## 2024-10-14 NOTE — Discharge Summary (Addendum)
 Physician Discharge Summary  Alisha Terrell FMW:[id number] DOB: Mar 22, 1941 DOA: 10/10/2024  PCP: Lazoff, Shawn P, DO  Admit date: 10/10/2024 Discharge date: 10/14/2024  Time spent: 45 minutes  Recommendations for Outpatient Follow-up:  Follow-up with [facility] heart on 1/12 BMP in 1 week PCP in 2 weeks   Discharge Diagnoses:  Principal Problem:   Acute on chronic diastolic heart failure (HCC) Active Problems:   Hypertensive urgency   Paroxysmal atrial fibrillation (HCC)   Asthma   COPD/ asthma   Arthritis involving multiple sites   Obesity   Encounter for education about heart failure   Discharge Condition: Improved  Diet recommendation: Low-sodium, heart healthy  Filed Weights   10/12/24 0359 10/13/24 0415 10/14/24 0435  Weight: 70.8 kg 71.2 kg 70.9 kg    History of present illness:  83/F with obesity, hypertension, diastolic CHF, paroxysmal A-fib, asthma, chronic back pain presented to the ED with shortness of breath and leg swelling for 1 to 2 weeks.  Reports that she has been off diuretics/torsemide  for over 8-9 months after some abnormal blood work in February. In the ED, BP 198/82, proBNP 4030, chest x-ray with mild pulmonary edema and interstitial prominence  Hospital Course:   Acute on chronic diastolic CHF - Last echo 12/30 noted EF 60-65%, grade 2 DD, normal RV - Improving with diuresis, appears close to euvolemic, could transition to oral diuretics, will defer to cards - Misunderstood home meds and was off torsemide  for> 6 months, in addition I suspect A-fib is also likely contributing, fortunately back in sinus rhythm now -Followed by cardiology this admission, ARB discontinued, started on Entresto and Jardiance, entresto then changed to losartan  due to cost barrier. -Toprol  dose decreased, diuretics changed to PRN -FU with [facility] 1/12    PAFib -in Afib at this time, HR was in 50-60 range on Amio, Toprol  200 and Cardizem  300mg  daily -  Discontinued Cardizem , lowered Toprol  to 100 mg -continue eliquis  - Likely contributing to fluid overload as well, now back in sinus rhythm   Hypertensive urgency Improved, meds as above   Asthma - Stable, continue Advair, Singulair , albuterol  as needed   Normocytic anemia Stable, monitor   Obesity, BMI 37.2  COPD Stable, continue home nebs  Discharge Exam: Vitals:   10/14/24 0723 10/14/24 1417  BP: (!) 171/78 (!) 159/83  Pulse: 71   Resp: 18   Temp: 97.7 F (36.5 C) 97.6 F (36.4 C)  SpO2: 98%    Gen: Awake, Alert, Oriented X 3,  HEENT: no JVD Lungs: Poor air movement CVS: S1S2/RRR Abd: soft, Non tender, non distended, BS present Extremities: No edema Skin: no new rashes on exposed skin   Discharge Instructions   Discharge Instructions     Increase activity slowly   Complete by: As directed       Allergies as of 10/14/2024       Reactions   Codeine Rash, Other (See Comments)   Agitation, bad dreams   Omnicef [cefdinir] Nausea And Vomiting   Confirmed with patient at bedside that no rash - just had significant GI effects        Medication List     STOP taking these medications    diltiazem  300 MG 24 hr capsule Commonly known as: CARDIZEM  CD   torsemide  20 MG tablet Commonly known as: DEMADEX        TAKE these medications    acetaminophen  325 MG tablet Commonly known as: TYLENOL  Take 650 mg by mouth at bedtime as  needed for mild pain (pain score 1-3) or moderate pain (pain score 4-6). What changed: Another medication with the same name was removed. Continue taking this medication, and follow the directions you see here.   Advair Diskus 250-50 MCG/DOSE Aepb Generic drug: fluticasone -salmeterol Inhale 1 puff into the lungs 2 (two) times daily.   amiodarone  200 MG tablet Commonly known as: PACERONE  Take 1 tablet (200 mg total) by mouth daily.   apixaban  5 MG Tabs tablet Commonly known as: ELIQUIS  Take 1 tablet (5 mg total) by mouth 2  (two) times daily.   Azelastine  HCl 137 MCG/SPRAY Soln Place 1 spray into both nostrils daily as needed (allergies).   calcium  carbonate 500 MG chewable tablet Commonly known as: TUMS - dosed in mg elemental calcium  Chew 500-1,000 mg by mouth 3 (three) times daily as needed for indigestion or heartburn.   CENTRUM SILVER ADULT 50+ PO Take 1 tablet by mouth in the morning.   diclofenac  Sodium 1 % Gel Commonly known as: VOLTAREN  Apply 1 Application topically 2 (two) times daily as needed (pain).   fluticasone  50 MCG/ACT nasal spray Commonly known as: FLONASE  Place 1 spray into both nostrils daily as needed for allergies.   furosemide  20 MG tablet Commonly known as: LASIX  Take 1 tablet (20 mg total) by mouth as needed. Increase swelling/weight gain of 3 LB in 1 day or 5 LB in 1 week What changed:  when to take this reasons to take this additional instructions   ibandronate  150 MG tablet Commonly known as: BONIVA  Take 150 mg by mouth every 30 (thirty) days.   Jardiance 10 MG Tabs tablet Generic drug: empagliflozin Take 1 tablet (10 mg total) by mouth daily. Start taking on: October 15, 2024   losartan  100 MG tablet Commonly known as: COZAAR  Take 1 tablet (100 mg total) by mouth daily.   MEDI-PATCH-LIDOCAINE  EX Apply 1 patch topically daily as needed (pain).   melatonin 5 MG Tabs Take 5 mg by mouth at bedtime.   metoprolol  succinate 100 MG 24 hr tablet Commonly known as: TOPROL -XL Take 1 tablet (100 mg total) by mouth daily. Take with or immediately following a meal. Start taking on: October 15, 2024 What changed:  medication strength See the new instructions.   montelukast  10 MG tablet Commonly known as: SINGULAIR  Take 10 mg by mouth at bedtime.   omeprazole 40 MG capsule Commonly known as: PRILOSEC Take 40 mg by mouth in the morning and at bedtime.   ProAir  HFA 108 (90 Base) MCG/ACT inhaler Generic drug: albuterol  Inhale 1-2 puffs into the lungs daily as  needed for wheezing or shortness of breath.   ROBITUSSIN COUGH/CHEST DM MAX PO Take 15 mLs by mouth at bedtime as needed (for cold symptoms).   traMADol  50 MG tablet Commonly known as: ULTRAM  Take 50 mg by mouth in the morning and at bedtime.       Allergies[1]  Contact information for follow-up providers     Lazoff, Shawn P, DO Follow up in 1 week(s).   Specialty: Family Medicine Contact information: [address] [place] [address] [phone]         [facility] [employer]. Go in 1 week(s).   Specialty: Cardiology Why: Please go to the Heart & Vascular center in one week for repeat blood work. This is to check your kidney function after starting new medications in the hospital for your heart. You do not  need an appointment for this blood work. Contact information: [address] [place]  [address] [phone]             Contact information for after-discharge care     Home Medical Care     [facility]) .   Service: Home Health Services Why: HHPT arranged- they will contact you to schedule Contact information: [address] [place] [address] [place]  [phone]                      The results of significant diagnostics from this hospitalization (including imaging, microbiology, ancillary and laboratory) are listed below for reference.    Significant Diagnostic Studies: ECHOCARDIOGRAM COMPLETE Result Date: 10/11/2024    ECHOCARDIOGRAM REPORT   Patient Name:   Alisha Terrell Date of Exam: 10/11/2024 Medical Rec #:  [id number]          Height:       57.0 in Accession #:    [id number]         Weight:       166.0 lb Date of Birth:  [birth year]          BSA:          1.662 m Patient Age:    83 years           BP:           166/88 mmHg Patient Gender: F                  HR:           64 bpm. Exam Location:  Inpatient Procedure: 2D Echo, Cardiac Doppler  and Color Doppler (Both Spectral and Color            Flow Doppler were utilized during procedure). Indications:    I50.33 Acute on chronic diastolic (congestive) heart failure  History:        Patient has prior history of Echocardiogram examinations, most                 recent 12/01/2023. CHF, Abnormal ECG, TIA and COPD,                 Arrythmias:Atrial Fibrillation, Signs/Symptoms:Bacteremia; Risk                 Factors:Sleep Apnea and Hypertension.  Sonographer:    Ellouise Mose RDCS Referring Phys: [id number] RONDELL A SMITH IMPRESSIONS  1. Left ventricular ejection fraction, by estimation, is 60 to 65%. Left ventricular ejection fraction by 2D MOD biplane is 62.1 %. The left ventricle has normal function. The left ventricle has no regional wall motion abnormalities. There is mild concentric left ventricular hypertrophy. Left ventricular diastolic parameters are consistent with Grade II diastolic dysfunction (pseudonormalization). Elevated left atrial pressure. The E/e' is 19.  2. Right ventricular systolic function is normal. The right ventricular size is mildly enlarged. Tricuspid regurgitation signal is inadequate for assessing PA pressure.  3. The mitral valve is normal in structure. Mild mitral valve regurgitation. No evidence of mitral stenosis.  4. The aortic valve is tricuspid. Aortic valve regurgitation is not visualized. No aortic stenosis is present.  5. Aortic dilatation noted. There is dilatation of the ascending aorta, measuring 42 mm.  6. The inferior vena cava is dilated in size with >50% respiratory variability, suggesting right atrial pressure of 8 mmHg.  7. Pleural effusion noted. FINDINGS  Left Ventricle: Left ventricular ejection fraction, by estimation,  is 60 to 65%. Left ventricular ejection fraction by 2D MOD biplane is 62.1 %. The left ventricle has normal function. The left ventricle has no regional wall motion abnormalities. The left ventricular internal cavity size was normal in size.  There is mild concentric left ventricular hypertrophy. Left ventricular diastolic parameters are consistent with Grade II diastolic dysfunction (pseudonormalization). Elevated left atrial pressure. The E/e' is 10. Right Ventricle: The right ventricular size is mildly enlarged. No increase in right ventricular wall thickness. Right ventricular systolic function is normal. Tricuspid regurgitation signal is inadequate for assessing PA pressure. Left Atrium: Left atrial size was normal in size. Right Atrium: Right atrial size was normal in size. Pericardium: Pleural effusion noted. There is no evidence of pericardial effusion. Mitral Valve: Tiger stripes seen in MR signal, but unable to measure full envelope. The mitral valve is normal in structure. Mild mitral valve regurgitation. No evidence of mitral valve stenosis. Tricuspid Valve: The tricuspid valve is normal in structure. Tricuspid valve regurgitation is trivial. No evidence of tricuspid stenosis. Aortic Valve: The aortic valve is tricuspid. Aortic valve regurgitation is not visualized. No aortic stenosis is present. Pulmonic Valve: The pulmonic valve was normal in structure. Pulmonic valve regurgitation is not visualized. No evidence of pulmonic stenosis. Aorta: The aortic root and ascending aorta are structurally normal, with no evidence of dilitation and aortic dilatation noted. There is dilatation of the ascending aorta, measuring 42 mm. Venous: A systolic blunting flow pattern is recorded from the right upper pulmonary vein. The inferior vena cava is dilated in size with greater than 50% respiratory variability, suggesting right atrial pressure of 8 mmHg. IAS/Shunts: No atrial level shunt detected by color flow Doppler.  LEFT VENTRICLE PLAX 2D                        Biplane EF (MOD) LVIDd:         4.17 cm         LV Biplane EF:   Left LVIDs:         3.20 cm                          ventricular LV PW:         1.37 cm                          ejection LV IVS:         1.37 cm                          fraction by LVOT diam:     2.10 cm                          2D MOD LV SV:         82                               biplane is LV SV Index:   50                               62.1 %. LVOT Area:     3.46 cm LV IVRT:       88 msec  Diastology                                LV e' medial:    4.03 cm/s                                LV E/e' medial:  18.8 LV Volumes (MOD)               LV e' lateral:   11.20 cm/s LV vol d, MOD    90.1 ml       LV E/e' lateral: 6.8 A2C: LV vol d, MOD    107.0 ml A4C: LV vol s, MOD    38.6 ml A2C: LV vol s, MOD    38.9 ml A4C: LV SV MOD A2C:   51.5 ml LV SV MOD A4C:   107.0 ml LV SV MOD BP:    63.4 ml RIGHT VENTRICLE             IVC RV S prime:     16.40 cm/s  IVC diam: 2.20 cm TAPSE (M-mode): 2.0 cm                             PULMONARY VEINS                             Diastolic Velocity: 55.90 cm/s                             S/D Velocity:       0.90                             Systolic Velocity:  53.00 cm/s LEFT ATRIUM             Index        RIGHT ATRIUM           Index LA diam:        3.50 cm 2.11 cm/m   RA Area:     11.20 cm LA Vol (A2C):   44.2 ml 26.60 ml/m  RA Volume:   23.00 ml  13.84 ml/m LA Vol (A4C):   45.8 ml 27.56 ml/m LA Biplane Vol: 46.2 ml 27.80 ml/m  AORTIC VALVE LVOT Vmax:   123.00 cm/s LVOT Vmean:  77.100 cm/s LVOT VTI:    0.238 m  AORTA Ao Root diam: 3.30 cm Ao Asc diam:  4.05 cm MITRAL VALVE MV Area (PHT): 3.21 cm    SHUNTS MV Decel Time: 236 msec    Systemic VTI:  0.24 m MV E velocity: 75.80 cm/s  Systemic Diam: 2.10 cm MV A velocity: 70.00 cm/s MV E/A ratio:  1.08 Dalton McleanMD Electronically signed by Ezra Kanner Signature Date/Time: 10/11/2024/4:57:03 PM    Final    DG Chest 2 View Result Date: 10/10/2024 EXAM: 2 VIEW(S) XRAY OF THE CHEST 10/10/2024 05:52:00 PM COMPARISON: 12/01/2023 CLINICAL HISTORY: shob FINDINGS: LUNGS AND PLEURA: Mild pulmonary edema. Interstitial prominence. No pleural effusion.  No pneumothorax. HEART AND MEDIASTINUM: Cardiomegaly. Tortuous aorta with atherosclerosis. BONES AND SOFT TISSUES: Thoracic kyphosis. IMPRESSION: 1. Mild pulmonary edema with interstitial prominence. 2. Cardiomegaly with a tortuous aorta and atherosclerosis. Electronically signed by: Greig  Maple MD 10/10/2024 08:36 PM EST RP Workstation: [id number]    Microbiology: No results found for this or any previous visit (from the past 240 hours).   Labs: Basic Metabolic Panel: Recent Labs  Lab 10/10/24 1914 10/11/24 0442 10/12/24 0346 10/13/24 0930 10/14/24 0240  NA 136 138 138 138 136  K 4.2 3.4* 3.4* 4.1 4.1  CL 100 102 100 102 100  CO2 23 24 25  21* 25  GLUCOSE 126* 104* 97 140* 101*  BUN 15 11 16 21 21   CREATININE 0.78 0.70 0.95 1.11* 1.01*  CALCIUM  9.3 9.2 8.7* 9.2 9.1  MG  --  2.0  --   --   --    Liver Function Tests: Recent Labs  Lab 10/11/24 0442  AST 26  ALT 18  ALKPHOS 62  BILITOT 0.6  PROT 6.9  ALBUMIN 4.2   No results for input(s): LIPASE, AMYLASE in the last 168 hours. No results for input(s): AMMONIA in the last 168 hours. CBC: Recent Labs  Lab 10/10/24 1914 10/11/24 0442 10/12/24 0346  WBC 9.3 8.8 7.1  NEUTROABS  --  7.0  --   HGB 11.0* 11.0* 11.7*  HCT 34.1* 33.7* 34.2*  MCV 86.8 86.0 83.0  PLT 296 255 293   Cardiac Enzymes: No results for input(s): CKTOTAL, CKMB, CKMBINDEX, TROPONINI in the last 168 hours. BNP: BNP (last 3 results) Recent Labs    11/30/23 1856  BNP 237.4*    ProBNP (last 3 results) Recent Labs    10/10/24 1921  PROBNP 4,030.0*    CBG: No results for input(s): GLUCAP in the last 168 hours.     Signed:  Sigurd Pac MD.  [facility] 10/14/2024, 3:39 PM        [1]  Allergies Allergen Reactions   Codeine Rash and Other (See Comments)    Agitation, bad dreams   Omnicef [Cefdinir] Nausea And Vomiting    Confirmed with patient at bedside that no rash - just had significant GI effects

## 2024-10-14 NOTE — TOC Transition Note (Signed)
 Transition of Care (TOC) - Discharge Note Rayfield Gobble RN, BSN Inpatient Care Management Unit 4E- RN Case Manager See Treatment Team for direct phone # [phone] cross coverage  Patient Details  Name: Alisha Terrell MRN: [id number] Date of Birth: [birth year]  Transition of Care [facility] CM/SW Contact:  Gobble Rayfield Hurst, RN Phone Number: 10/14/2024, 2:24 PM   Clinical Narrative:    Pt stable for transition home today, Per previous CM note HH referral has been accepted by [facility] in the [place].  MD has placed order for HHPT.  [facility] liaison updated and to follow for start of care.   IP CM interventions have been completed no further needs noted.   Final next level of care: Home w Home Health Services Barriers to Discharge: Barriers Resolved   Patient Goals and CMS Choice Patient states their goals for this hospitalization and ongoing recovery are:: to go home CMS Medicare.gov Compare Post Acute Care list provided to:: Patient Choice offered to / list presented to : Patient      Discharge Placement                 Home w/ [facility]      Discharge Plan and Services Additional resources added to the After Visit Summary for     Discharge Planning Services: CM Consult Post Acute Care Choice: Home Health          DME Arranged: N/A DME Agency: NA       HH Arranged: PT HH Agency: [facility] Date [facility] Agency Contacted: 10/12/24 Time HH Agency Contacted: 1231 Representative spoke with at [facility] Agency: Angie  Social Drivers of Health (SDOH) Interventions SDOH Screenings   Food Insecurity: No Food Insecurity (10/11/2024)  Housing: Low Risk (10/11/2024)  Transportation Needs: No Transportation Needs (10/11/2024)  Utilities: Not At Risk (10/11/2024)  Financial Resource Strain: Patient Unable To Answer (04/26/2024)   Received from [facility]  Social Connections: Moderately Isolated (10/11/2024)  Tobacco Use: Medium Risk (10/10/2024)     Readmission Risk  Interventions     No data to display

## 2024-10-14 NOTE — Progress Notes (Deleted)
 Physician Discharge Summary  Alisha Terrell FMW:[id number] DOB: October 21, 1940 DOA: 10/10/2024  PCP: Lazoff, Shawn P, DO  Admit date: 10/10/2024 Discharge date: 10/14/2024  Time spent: 45 minutes  Recommendations for Outpatient Follow-up:  Follow-up with [facility] heart care next week BMP in 1 week PCP in 2 weeks   Discharge Diagnoses:  Principal Problem:   Acute on chronic diastolic heart failure (HCC) Active Problems:   Hypertensive urgency   Paroxysmal atrial fibrillation (HCC)   Asthma   COPD/ asthma   Arthritis involving multiple sites   Obesity   Encounter for education about heart failure   Discharge Condition: Improved  Diet recommendation: Low-sodium, heart healthy  Filed Weights   10/12/24 0359 10/13/24 0415 10/14/24 0435  Weight: 70.8 kg 71.2 kg 70.9 kg    History of present illness:  83/F with obesity, hypertension, diastolic CHF, paroxysmal A-fib, asthma, chronic back pain presented to the ED with shortness of breath and leg swelling for 1 to 2 weeks.  Reports that she has been off diuretics/torsemide  for over 8-9 months after some abnormal blood work in February. In the ED, BP 198/82, proBNP 4030, chest x-ray with mild pulmonary edema and interstitial prominence  Hospital Course:   Acute on chronic diastolic CHF - Last echo 12/30 noted EF 60-65%, grade 2 DD, normal RV - Improving with diuresis, appears close to euvolemic, could transition to oral diuretics, will defer to cards - Misunderstood home meds and was off torsemide  for> 6 months, in addition I suspect A-fib is also likely contributing, fortunately back in sinus rhythm now -Followed by cardiology this admission, ARB discontinued, started on Entresto and Jardiance -Toprol  dose decreased, diuretics changed to PRN    PAFib -in Afib at this time, HR was in 50-60 range on Amio, Toprol  200 and Cardizem  300mg  daily - Discontinued Cardizem , lowered Toprol  to 100 mg -continue eliquis  - Likely contributing  to fluid overload as well, now back in sinus rhythm   Hypertensive urgency Improved, meds as above   Asthma - Stable, continue Advair, Singulair , albuterol  as needed   Normocytic anemia Stable, monitor   Obesity, BMI 37.2  COPD Stable, continue home nebs  Discharge Exam: Vitals:   10/14/24 0435 10/14/24 0723  BP: (!) 117/95 (!) 171/78  Pulse: 76 71  Resp: 19 18  Temp: (!) 97.4 F (36.3 C) 97.7 F (36.5 C)  SpO2: 91% 98%   Gen: Awake, Alert, Oriented X 3,  HEENT: no JVD Lungs: Poor air movement CVS: S1S2/RRR Abd: soft, Non tender, non distended, BS present Extremities: No edema Skin: no new rashes on exposed skin   Discharge Instructions   Discharge Instructions     Increase activity slowly   Complete by: As directed       Allergies as of 10/14/2024       Reactions   Codeine Rash, Other (See Comments)   Agitation, bad dreams   Omnicef [cefdinir] Nausea And Vomiting   Confirmed with patient at bedside that no rash - just had significant GI effects        Medication List     STOP taking these medications    diltiazem  300 MG 24 hr capsule Commonly known as: CARDIZEM  CD   losartan  100 MG tablet Commonly known as: COZAAR    torsemide  20 MG tablet Commonly known as: DEMADEX        TAKE these medications    acetaminophen  325 MG tablet Commonly known as: TYLENOL  Take 650 mg by mouth at bedtime as needed for mild  pain (pain score 1-3) or moderate pain (pain score 4-6). What changed: Another medication with the same name was removed. Continue taking this medication, and follow the directions you see here.   Advair Diskus 250-50 MCG/DOSE Aepb Generic drug: fluticasone -salmeterol Inhale 1 puff into the lungs 2 (two) times daily.   amiodarone  200 MG tablet Commonly known as: PACERONE  Take 1 tablet (200 mg total) by mouth daily.   apixaban  5 MG Tabs tablet Commonly known as: ELIQUIS  Take 1 tablet (5 mg total) by mouth 2 (two) times daily.    Azelastine  HCl 137 MCG/SPRAY Soln Place 1 spray into both nostrils daily as needed (allergies).   calcium  carbonate 500 MG chewable tablet Commonly known as: TUMS - dosed in mg elemental calcium  Chew 500-1,000 mg by mouth 3 (three) times daily as needed for indigestion or heartburn.   CENTRUM SILVER ADULT 50+ PO Take 1 tablet by mouth in the morning.   diclofenac  Sodium 1 % Gel Commonly known as: VOLTAREN  Apply 1 Application topically 2 (two) times daily as needed (pain).   empagliflozin 10 MG Tabs tablet Commonly known as: JARDIANCE Take 1 tablet (10 mg total) by mouth daily. Start taking on: October 15, 2024   fluticasone  50 MCG/ACT nasal spray Commonly known as: FLONASE  Place 1 spray into both nostrils daily as needed for allergies.   furosemide  20 MG tablet Commonly known as: LASIX  Take 1 tablet (20 mg total) by mouth as needed. Increase swelling/weight gain of 3 LB in 1 day or 5 LB in 1 week What changed:  when to take this reasons to take this additional instructions   ibandronate  150 MG tablet Commonly known as: BONIVA  Take 150 mg by mouth every 30 (thirty) days.   MEDI-PATCH-LIDOCAINE  EX Apply 1 patch topically daily as needed (pain).   melatonin 5 MG Tabs Take 5 mg by mouth at bedtime.   metoprolol  succinate 100 MG 24 hr tablet Commonly known as: TOPROL -XL Take 1 tablet (100 mg total) by mouth daily. Take with or immediately following a meal. Start taking on: October 15, 2024 What changed:  medication strength See the new instructions.   montelukast  10 MG tablet Commonly known as: SINGULAIR  Take 10 mg by mouth at bedtime.   omeprazole 40 MG capsule Commonly known as: PRILOSEC Take 40 mg by mouth in the morning and at bedtime.   ProAir  HFA 108 (90 Base) MCG/ACT inhaler Generic drug: albuterol  Inhale 1-2 puffs into the lungs daily as needed for wheezing or shortness of breath.   ROBITUSSIN COUGH/CHEST DM MAX PO Take 15 mLs by mouth at bedtime as  needed (for cold symptoms).   sacubitril-valsartan 49-51 MG Commonly known as: ENTRESTO Take 1 tablet by mouth 2 (two) times daily.   traMADol  50 MG tablet Commonly known as: ULTRAM  Take 50 mg by mouth in the morning and at bedtime.       Allergies[1]  Contact information for follow-up providers     Lazoff, Shawn P, DO Follow up in 1 week(s).   Specialty: Family Medicine Contact information: [address] [place] [address] [phone]         [facility] A Dept of [facility] [employer]. Go in 1 week(s).   Specialty: Cardiology Why: Please go to the Heart & Vascular center in one week for repeat blood work. This is to check your kidney function after starting new medications in the hospital for your heart. You do not need an appointment  for this blood work. Contact information: [address] [place]  [address] [phone]             Contact information for after-discharge care     Home Medical Care     [facility]) .   Service: Home Health Services Contact information: [phone] [address] [place] [address] [place]  [phone]                      The results of significant diagnostics from this hospitalization (including imaging, microbiology, ancillary and laboratory) are listed below for reference.    Significant Diagnostic Studies: ECHOCARDIOGRAM COMPLETE Result Date: 10/11/2024    ECHOCARDIOGRAM REPORT   Patient Name:   Alisha Terrell Date of Exam: 10/11/2024 Medical Rec #:  [id number]          Height:       57.0 in Accession #:    [id number]         Weight:       166.0 lb Date of Birth:  10/17/1940          BSA:          1.662 m Patient Age:    83 years           BP:           166/88 mmHg Patient Gender: F                  HR:           64 bpm. Exam Location:  Inpatient Procedure: 2D Echo, Cardiac Doppler and Color Doppler (Both Spectral and Color            Flow  Doppler were utilized during procedure). Indications:    I50.33 Acute on chronic diastolic (congestive) heart failure  History:        Patient has prior history of Echocardiogram examinations, most                 recent 12/01/2023. CHF, Abnormal ECG, TIA and COPD,                 Arrythmias:Atrial Fibrillation, Signs/Symptoms:Bacteremia; Risk                 Factors:Sleep Apnea and Hypertension.  Sonographer:    Ellouise Mose RDCS Referring Phys: [id number] RONDELL A SMITH IMPRESSIONS  1. Left ventricular ejection fraction, by estimation, is 60 to 65%. Left ventricular ejection fraction by 2D MOD biplane is 62.1 %. The left ventricle has normal function. The left ventricle has no regional wall motion abnormalities. There is mild concentric left ventricular hypertrophy. Left ventricular diastolic parameters are consistent with Grade II diastolic dysfunction (pseudonormalization). Elevated left atrial pressure. The E/e' is 19.  2. Right ventricular systolic function is normal. The right ventricular size is mildly enlarged. Tricuspid regurgitation signal is inadequate for assessing PA pressure.  3. The mitral valve is normal in structure. Mild mitral valve regurgitation. No evidence of mitral stenosis.  4. The aortic valve is tricuspid. Aortic valve regurgitation is not visualized. No aortic stenosis is present.  5. Aortic dilatation noted. There is dilatation of the ascending aorta, measuring 42 mm.  6. The inferior vena cava is dilated in size with >50% respiratory variability, suggesting right atrial pressure of 8 mmHg.  7. Pleural effusion noted. FINDINGS  Left Ventricle: Left ventricular ejection fraction, by estimation, is 60 to 65%. Left ventricular ejection fraction by 2D MOD biplane  is 62.1 %. The left ventricle has normal function. The left ventricle has no regional wall motion abnormalities. The left ventricular internal cavity size was normal in size. There is mild concentric left ventricular hypertrophy. Left  ventricular diastolic parameters are consistent with Grade II diastolic dysfunction (pseudonormalization). Elevated left atrial pressure. The E/e' is 3. Right Ventricle: The right ventricular size is mildly enlarged. No increase in right ventricular wall thickness. Right ventricular systolic function is normal. Tricuspid regurgitation signal is inadequate for assessing PA pressure. Left Atrium: Left atrial size was normal in size. Right Atrium: Right atrial size was normal in size. Pericardium: Pleural effusion noted. There is no evidence of pericardial effusion. Mitral Valve: Tiger stripes seen in MR signal, but unable to measure full envelope. The mitral valve is normal in structure. Mild mitral valve regurgitation. No evidence of mitral valve stenosis. Tricuspid Valve: The tricuspid valve is normal in structure. Tricuspid valve regurgitation is trivial. No evidence of tricuspid stenosis. Aortic Valve: The aortic valve is tricuspid. Aortic valve regurgitation is not visualized. No aortic stenosis is present. Pulmonic Valve: The pulmonic valve was normal in structure. Pulmonic valve regurgitation is not visualized. No evidence of pulmonic stenosis. Aorta: The aortic root and ascending aorta are structurally normal, with no evidence of dilitation and aortic dilatation noted. There is dilatation of the ascending aorta, measuring 42 mm. Venous: A systolic blunting flow pattern is recorded from the right upper pulmonary vein. The inferior vena cava is dilated in size with greater than 50% respiratory variability, suggesting right atrial pressure of 8 mmHg. IAS/Shunts: No atrial level shunt detected by color flow Doppler.  LEFT VENTRICLE PLAX 2D                        Biplane EF (MOD) LVIDd:         4.17 cm         LV Biplane EF:   Left LVIDs:         3.20 cm                          ventricular LV PW:         1.37 cm                          ejection LV IVS:        1.37 cm                          fraction by LVOT  diam:     2.10 cm                          2D MOD LV SV:         82                               biplane is LV SV Index:   50                               62.1 %. LVOT Area:     3.46 cm LV IVRT:       88 msec         Diastology  LV e' medial:    4.03 cm/s                                LV E/e' medial:  18.8 LV Volumes (MOD)               LV e' lateral:   11.20 cm/s LV vol d, MOD    90.1 ml       LV E/e' lateral: 6.8 A2C: LV vol d, MOD    107.0 ml A4C: LV vol s, MOD    38.6 ml A2C: LV vol s, MOD    38.9 ml A4C: LV SV MOD A2C:   51.5 ml LV SV MOD A4C:   107.0 ml LV SV MOD BP:    63.4 ml RIGHT VENTRICLE             IVC RV S prime:     16.40 cm/s  IVC diam: 2.20 cm TAPSE (M-mode): 2.0 cm                             PULMONARY VEINS                             Diastolic Velocity: 55.90 cm/s                             S/D Velocity:       0.90                             Systolic Velocity:  53.00 cm/s LEFT ATRIUM             Index        RIGHT ATRIUM           Index LA diam:        3.50 cm 2.11 cm/m   RA Area:     11.20 cm LA Vol (A2C):   44.2 ml 26.60 ml/m  RA Volume:   23.00 ml  13.84 ml/m LA Vol (A4C):   45.8 ml 27.56 ml/m LA Biplane Vol: 46.2 ml 27.80 ml/m  AORTIC VALVE LVOT Vmax:   123.00 cm/s LVOT Vmean:  77.100 cm/s LVOT VTI:    0.238 m  AORTA Ao Root diam: 3.30 cm Ao Asc diam:  4.05 cm MITRAL VALVE MV Area (PHT): 3.21 cm    SHUNTS MV Decel Time: 236 msec    Systemic VTI:  0.24 m MV E velocity: 75.80 cm/s  Systemic Diam: 2.10 cm MV A velocity: 70.00 cm/s MV E/A ratio:  1.08 Dalton McleanMD Electronically signed by Ezra Kanner Signature Date/Time: 10/11/2024/4:57:03 PM    Final    DG Chest 2 View Result Date: 10/10/2024 EXAM: 2 VIEW(S) XRAY OF THE CHEST 10/10/2024 05:52:00 PM COMPARISON: 12/01/2023 CLINICAL HISTORY: shob FINDINGS: LUNGS AND PLEURA: Mild pulmonary edema. Interstitial prominence. No pleural effusion. No pneumothorax. HEART AND MEDIASTINUM: Cardiomegaly.  Tortuous aorta with atherosclerosis. BONES AND SOFT TISSUES: Thoracic kyphosis. IMPRESSION: 1. Mild pulmonary edema with interstitial prominence. 2. Cardiomegaly with a tortuous aorta and atherosclerosis. Electronically signed by: Greig Pique MD 10/10/2024 08:36 PM EST RP Workstation: [id number]    Microbiology: No results found for this or any previous visit (from the past 240 hours).   Labs: Basic Metabolic  Panel: Recent Labs  Lab 10/10/24 1914 10/11/24 0442 10/12/24 0346 10/13/24 0930 10/14/24 0240  NA 136 138 138 138 136  K 4.2 3.4* 3.4* 4.1 4.1  CL 100 102 100 102 100  CO2 23 24 25  21* 25  GLUCOSE 126* 104* 97 140* 101*  BUN 15 11 16 21 21   CREATININE 0.78 0.70 0.95 1.11* 1.01*  CALCIUM  9.3 9.2 8.7* 9.2 9.1  MG  --  2.0  --   --   --    Liver Function Tests: Recent Labs  Lab 10/11/24 0442  AST 26  ALT 18  ALKPHOS 62  BILITOT 0.6  PROT 6.9  ALBUMIN 4.2   No results for input(s): LIPASE, AMYLASE in the last 168 hours. No results for input(s): AMMONIA in the last 168 hours. CBC: Recent Labs  Lab 10/10/24 1914 10/11/24 0442 10/12/24 0346  WBC 9.3 8.8 7.1  NEUTROABS  --  7.0  --   HGB 11.0* 11.0* 11.7*  HCT 34.1* 33.7* 34.2*  MCV 86.8 86.0 83.0  PLT 296 255 293   Cardiac Enzymes: No results for input(s): CKTOTAL, CKMB, CKMBINDEX, TROPONINI in the last 168 hours. BNP: BNP (last 3 results) Recent Labs    11/30/23 1856  BNP 237.4*    ProBNP (last 3 results) Recent Labs    10/10/24 1921  PROBNP 4,030.0*    CBG: No results for input(s): GLUCAP in the last 168 hours.     Signed:  Sigurd Pac MD.  [facility] 10/14/2024, 12:16 PM       [1]  Allergies Allergen Reactions   Codeine Rash and Other (See Comments)    Agitation, bad dreams   Omnicef [Cefdinir] Nausea And Vomiting    Confirmed with patient at bedside that no rash - just had significant GI effects

## 2024-10-14 NOTE — Telephone Encounter (Signed)
 Pharmacy Patient Advocate Encounter  Insurance verification completed.    The patient is insured through [employer]. Patient has Medicare and is not eligible for a copay card, but may be able to apply for patient assistance or Medicare RX Payment Plan (Patient Must reach out to their plan, if eligible for payment plan), if available.    Ran test claim for Eliquis  5mg  tablet and the current 30 day co-pay is $248.41 due to $500 deductible.   This test claim was processed through [facility]- copay amounts may vary at other pharmacies due to pharmacy/plan contracts, or as the patient moves through the different stages of their insurance plan.

## 2024-10-14 NOTE — Telephone Encounter (Signed)
 Pharmacy Patient Advocate Encounter  Insurance verification completed.    The patient is insured through [employer]. Patient has Medicare and is not eligible for a copay card, but may be able to apply for patient assistance or Medicare RX Payment Plan (Patient Must reach out to their plan, if eligible for payment plan), if available.    Ran test claim for Entresto 24-26mg  tablet and the current 30 day co-pay is $134.79.  Ran test claim for Jardiance 10mg  tablet and the current 30 day co-pay is $203.53  This test claim was processed through [employer]- copay amounts may vary at other pharmacies due to [employer], or as the patient moves through the different stages of their insurance plan.

## 2024-10-14 NOTE — Progress Notes (Signed)
 Heart Failure Navigator Progress Note  Assessed for Heart & Vascular TOC clinic readiness.  Patient does not meet criteria due to EF 60-65%, has a scheduled [facility] appointment on 11/03/2024. No HF TOC per Dr. Fairy. .   Navigator will sign off at this time.   Stephane Haddock, BSN, Scientist, Clinical (histocompatibility And Immunogenetics) Only

## 2024-10-14 NOTE — Progress Notes (Signed)
 Discharge Nurse Summary: DC order noted per MD. DC RN at bedside with patient. Patient agreeable with discharge plan, states family will arrive soon for pickup. VSS after BP reassessed. AVS printed/reviewed. PIV removed, skin intact. No DME needs. No home meds. TOC meds pending pickup. CP/Edu resolved. Telemonitor returned to charging station. All belongings accounted for. Patient wheeled downstairs for discharge by private auto.   Rosario EMERSON Lund, RN

## 2024-10-14 NOTE — Progress Notes (Signed)
 "  Rounding Note   Patient Name: Alisha Terrell Date of Encounter: 10/14/2024  [facility] Cardiologist: Soyla DELENA Merck, MD   Subjective Breathing is better No CP   Scheduled Meds:  amiodarone   200 mg Oral Daily   apixaban   5 mg Oral BID   azelastine   1 spray Each Nare Daily   dapagliflozin  propanediol  10 mg Oral Daily   diclofenac  Sodium  2 g Topical QHS   fluticasone  furoate-vilanterol  1 puff Inhalation Daily   losartan   25 mg Oral Daily   metoprolol   100 mg Oral Daily   montelukast   10 mg Oral QHS   pantoprazole   40 mg Oral BID   sodium chloride  flush  3 mL Intravenous Q12H   Continuous Infusions:  PRN Meds: acetaminophen  **OR** acetaminophen , albuterol , alum & mag hydroxide-simeth, hydrALAZINE, melatonin, ondansetron  (ZOFRAN ) IV, traMADol , trimethobenzamide   Vital Signs  Vitals:   10/13/24 2140 10/14/24 0102 10/14/24 0435 10/14/24 0723  BP: (!) 140/66 (!) 175/80 (!) 117/95 (!) 171/78  Pulse: 66 72 76 71  Resp: 18 19 19 18   Temp: (!) 97.4 F (36.3 C) (!) 97.4 F (36.3 C) (!) 97.4 F (36.3 C) 97.7 F (36.5 C)  TempSrc: Oral Oral Oral Oral  SpO2: 97% 96% 91% 98%  Weight:   70.9 kg   Height:       No intake or output data in the 24 hours ending 10/14/24 0858    10/14/2024    4:35 AM 10/13/2024    4:15 AM 10/12/2024    3:59 AM  Last 3 Weights  Weight (lbs) 156 lb 3.2 oz 156 lb 15.5 oz 156 lb  Weight (kg) 70.852 kg 71.2 kg 70.761 kg      Telemetry SR   - Personally Reviewed  ECG  No new  - Personally Reviewed  Physical Exam  GEN: No acute distress.   Neck: No JVD Cardiac: RRR, no murmurs, rubs, or gallops.  Respiratory: Clear to auscultation bilaterally. GI: Soft, nontender, non-distended  MS: No edema;   Labs High Sensitivity Troponin:  No results for input(s): TROPONINIHS in the last 720 hours. No results for input(s): TRNPT in the last 720 hours.     Chemistry Recent Labs  Lab 10/11/24 0442 10/12/24 0346 10/13/24 0930  10/14/24 0240  NA 138 138 138 136  K 3.4* 3.4* 4.1 4.1  CL 102 100 102 100  CO2 24 25 21* 25  GLUCOSE 104* 97 140* 101*  BUN 11 16 21 21   CREATININE 0.70 0.95 1.11* 1.01*  CALCIUM  9.2 8.7* 9.2 9.1  MG 2.0  --   --   --   PROT 6.9  --   --   --   ALBUMIN 4.2  --   --   --   AST 26  --   --   --   ALT 18  --   --   --   ALKPHOS 62  --   --   --   BILITOT 0.6  --   --   --   GFRNONAA >60 59* 49* 55*  ANIONGAP 12 13 14 11     Lipids No results for input(s): CHOL, TRIG, HDL, LABVLDL, LDLCALC, CHOLHDL in the last 168 hours.  Hematology Recent Labs  Lab 10/10/24 1914 10/11/24 0442 10/12/24 0346  WBC 9.3 8.8 7.1  RBC 3.93 3.92 4.12  HGB 11.0* 11.0* 11.7*  HCT 34.1* 33.7* 34.2*  MCV 86.8 86.0 83.0  MCH 28.0 28.1  28.4  MCHC 32.3 32.6 34.2  RDW 15.7* 15.8* 15.5  PLT 296 255 293   Thyroid   Recent Labs  Lab 10/11/24 0442  TSH 1.510    BNP Recent Labs  Lab 10/10/24 1921  PROBNP 4,030.0*    DDimer No results for input(s): DDIMER in the last 168 hours.   Radiology  No results found.  Cardiac Studies Echo 10/11/24  1. Left ventricular ejection fraction, by estimation, is 60 to 65%. Left  ventricular ejection fraction by 2D MOD biplane is 62.1 %. The left  ventricle has normal function. The left ventricle has no regional wall  motion abnormalities. There is mild  concentric left ventricular hypertrophy. Left ventricular diastolic  parameters are consistent with Grade II diastolic dysfunction  (pseudonormalization). Elevated left atrial pressure. The E/e' is 19.   2. Right ventricular systolic function is normal. The right ventricular  size is mildly enlarged. Tricuspid regurgitation signal is inadequate for  assessing PA pressure.   3. The mitral valve is normal in structure. Mild mitral valve  regurgitation. No evidence of mitral stenosis.   4. The aortic valve is tricuspid. Aortic valve regurgitation is not  visualized. No aortic stenosis is present.    5. Aortic dilatation noted. There is dilatation of the ascending aorta,  measuring 42 mm.   6. The inferior vena cava is dilated in size with >50% respiratory  variability, suggesting right atrial pressure of 8 mmHg.   7. Pleural effusion noted.    Patient Profile   Alisha Terrell is a 84 y.o. female with a hx of hypertension, TIA, asthma, obesity, OSA on CPAP, and paroxysmal atrial fibrillation on Eliquis  who is being seen 10/12/2024 for the evaluation of atrial fibrillation at the request of [name].   Assessment & Plan   1  HFpEF Volume status is improved from admit   She admitted at that time to not following diet  She was not taking lasix  regularly at home   WIll need to weigh daily and take prn if weight goes up over 3 lbs Added Entresto and Jardiance    Will need close outpt follow up 2  Hx PAF Patient has remained in SB/SR  No Afib    She is on amiodarone  200 daily and Toprol  XL 100 bid  Also on ELiquis  5 bid  Dilt was sopped      Follow as outpt      3  HTN  BP has been elevated since admit    She was on 100 losartan  at home  I would switch to ENtresto 49/51   Keep on Toprol  XL for now    Off of diltiazem     Could go home today  Will arrange for follow up  next week labs and then in clinic  Signed, Vina Gull, MD  10/14/2024, 8:58 AM    "

## 2024-11-02 NOTE — Progress Notes (Unsigned)
 "   Cardiology Clinic Note   Patient Name: Alisha Terrell Date of Encounter: 11/03/2024  Primary Care Provider:  Lazoff, Shawn P, DO Primary Cardiologist:  Gayatri A Acharya, MD  Patient Profile     Alisha Terrell 84 year old female presents the clinic today for follow-up evaluation of her acute on chronic CHF.  Past Medical History    Past Medical History:  Diagnosis Date   ARF (acute renal failure)    2020  resolved  it was presrnt when she was septic   Arthritis    Asthma    CHF (congestive heart failure) (HCC)    Chronic back pain    Class 2 obesity due to excess calories with body mass index (BMI) of 35.0 to 35.9 in adult    Dyspnea    ocassional   GERD (gastroesophageal reflux disease)    Headache    Hypertension    OSA (obstructive sleep apnea)    PAF (paroxysmal atrial fibrillation) (HCC)    TIA (transient ischemic attack) 2018   no resedule   Past Surgical History:  Procedure Laterality Date   ABDOMINAL SURGERY     CESAREAN SECTION     CHOLECYSTECTOMY     LESION EXCISION WITH COMPLEX REPAIR Left 07/25/2022   Procedure: LESION EXCISION OF LEFT BUCCAL MUCOSA;  Surgeon: Llewellyn Gerard LABOR, DO;  Location: [facility] OR;  Service: ENT;  Laterality: Left;    Allergies  Allergies  Allergen Reactions   Codeine Rash and Other (See Comments)    Agitation, bad dreams   Omnicef [Cefdinir] Nausea And Vomiting    Confirmed with patient at bedside that no rash - just had significant GI effects    History of Present Illness     Alisha Terrell has a PMH of paroxysmal atrial fibrillation, HTN, TIA, asthma, GERD, OSA on CPAP, and obesity.  She was admitted from the office 11/30/23 for acute on chronic CHF.  She presented with significant lower extremity edema, weight gain, and worsening shortness of breath.  She was noted to have orthopnea and PND.  Her chest x-ray showed mild pulmonary edema with scattered atelectasis.  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  She received IV diuresis and her discharge weight was noted to be 165 pounds (down from 180 pounds on admission).  She was discharged on torsemide  20 mg daily along with potassium 20 mill equivalents daily.  She was also started on spironolactone  25 mg and Farxiga  10 mg daily.  She presented the clinic 12/30/23 for follow-up evaluation and stated she was feeling much better since being discharged from the hospital.  We reviewed her hospitalization.  She expressed understanding.  She had been seen by her PCP in follow-up.  They discontinued her spironolactone .  She had repeat labs planned on Friday.  Her blood pressure at home had been in the 160s over 80s.  In the clinic  her blood pressure was initially 159/78 and on recheck was 148/68.  I increased  her metoprolol  to 200 mg daily.  I gave her the salty 6 diet sheet, asked her to maintain her physical activity and  planned follow-up in 2 to 3 months.    She followed up 03/01/2024 and stated she had a cortisone injection in her shoulder last Friday.  Since that time she had felt a little  bit off.  Feeling bad.  She did note that she was a little bit weaker.  She was breathing better today.  She did note that she had some shortness of breath with her CPAP the prior night.  She took it off and was able to sleep in a chair.  She was euvolemic on exam.  She appeared well compensated.    She reported some white phlegm when she was coughing.  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  Her blood pressure was better controlled at 136/70.  She also noted occasional brief episodes of dizziness.  I will planned follow-up in 6 months.  She was admitted on 10/11/2024 and discharged on 10/14/2024.  Her echocardiogram 10/11/2024 showed normal LVEF mild LVH, G2 DD mild mitral valve regurgitation and dilation of the ascending aorta  measuring 42 mm.  She was admitted with shortness of breath, lower extremity swelling, abdominal distention.  She received diuresis.  She reported a stable weight until just after Halloween.  During that time she noted that her weight would go up 1 to 2 pounds at a time.  She was noted to have an overall weight gain of about 10 pounds at the time of admission.  After diuresis her symptoms significantly improved.  She was started on Entresto  and Jardiance .  She maintained sinus bradycardia/sinus rhythm throughout her hospitalization.  She presents to the clinic today for follow-up evaluation.  She states she is feeling much better since being home from the hospital.  She reports that she is weighing herself daily.  Her weights have been stable at around 157-160 pounds.  She reports compliance with her medications.  Her blood pressure initially today is 166/92.  She ambulates with a rolling walker.  She feels that her blood pressure is elevated due to her increased physical activity.  We reviewed her hospitalization.  She expressed understanding.  I will have her continue her low-sodium diet, stop losartan , start Entresto  24/26, order a BMP, CBC today, order a BMP in 1 week  and plan follow-up in 1 to 2 months.  Today she denies chest pain, shortness of breath, lower extremity edema, fatigue, palpitations, melena, hematuria, hemoptysis, diaphoresis, weakness, presyncope, syncope, orthopnea, and PND.   Home Medications    Prior to Admission medications   Medication Sig Start Date End Date Taking? Authorizing Provider  acetaminophen  (TYLENOL ) 500 MG tablet Take 500 mg by mouth every 6 (six) hours as needed. Two Tablets Every 6 hours as Needed For Pain    [provider]  ADVAIR DISKUS 250-50 MCG/DOSE AEPB Inhale 1 puff into the lungs 2 (two) times daily.  08/22/15   [provider]  amiodarone  (PACERONE ) 200 MG tablet Take 1 tablet (200 mg total) by mouth daily. 05/18/23   Acharya, Gayatri A,  MD  apixaban  (ELIQUIS ) 5 MG TABS tablet Take 1 tablet (5 mg total) by mouth 2 (two) times daily. 08/07/23   Jerilynn Lamarr HERO, NP  Azelastine  HCl 137 MCG/SPRAY SOLN Place 1 spray into both nostrils daily. 03/12/22   [provider]  calcium  carbonate (TUMS - DOSED IN MG ELEMENTAL CALCIUM ) 500 MG chewable tablet Chew 1 tablet by mouth daily as needed for indigestion or heartburn.    [provider]  dapagliflozin  propanediol (FARXIGA ) 10 MG TABS tablet Take 1 tablet (10 mg total) by mouth daily. 12/04/23   Goodrich, Callie E, PA-C  Dextromethorphan -Guaifenesin  (ROBITUSSIN COUGH/CHEST DM MAX PO) Take 1 mL by mouth 2 (two) times daily as needed (for cold  symptoms).     [provider]  diclofenac  Sodium (VOLTAREN ) 1 % GEL Apply 2 g topically at bedtime. 08/16/19   [provider]  diltiazem  (CARDIZEM  CD) 300 MG 24 hr capsule Take 1 capsule by mouth daily. 09/14/23   [provider]  fluticasone  (FLONASE ) 50 MCG/ACT nasal spray Place 1 spray into both nostrils at bedtime.  09/26/15   [provider]  ibandronate  (BONIVA ) 150 MG tablet Take 150 mg by mouth every 30 (thirty) days.  08/16/19   [provider]  lidocaine  (LMX) 4 % cream Apply 1 Application topically as needed (back pain).    [provider]  losartan  (COZAAR ) 100 MG tablet TAKE 1 TABLET BY MOUTH DAILY 01/05/23   Acharya, Gayatri A, MD  melatonin 5 MG TABS Take 5 mg by mouth at bedtime.    [provider]  metoprolol  succinate (TOPROL -XL) 100 MG 24 hr tablet Take 1 tablet (100 mg total) by mouth daily. Take with or immediately following a meal. 12/04/23   Goodrich, Callie E, PA-C  montelukast  (SINGULAIR ) 10 MG tablet Take 10 mg by mouth at bedtime. 09/10/15   [provider]  Multiple Vitamins-Minerals (CENTRUM SILVER ADULT 50+ PO) Take 1 tablet by mouth in the morning.    [provider]  omeprazole (PRILOSEC) 40 MG capsule Take 40 mg by mouth in  the morning and at bedtime.  09/10/15   [provider]  ondansetron  (ZOFRAN -ODT) 4 MG disintegrating tablet Take 4 mg by mouth every 8 (eight) hours as needed for vomiting or nausea. Patient not taking: Reported on 11/30/2023 03/22/21   [provider]  potassium chloride  SA (KLOR-CON  M) 20 MEQ tablet Take 1 tablet (20 mEq total) by mouth daily. 12/04/23   Goodrich, Callie E, PA-C  PROAIR  HFA 108 (90 Base) MCG/ACT inhaler Inhale 1-2 puffs into the lungs at bedtime. 10/05/15   [provider]  spironolactone  (ALDACTONE ) 25 MG tablet Take 1 tablet (25 mg total) by mouth daily. 12/04/23   Goodrich, Callie E, PA-C  torsemide  (DEMADEX ) 20 MG tablet Take 1 tablet (20 mg total) by mouth daily. 12/04/23   Goodrich, Callie E, PA-C  traMADol  (ULTRAM ) 50 MG tablet Take 50 mg by mouth in the morning and at bedtime. 03/23/19   [provider]    Family History    Family History  Problem Relation Age of Onset   Hypertension Mother 84   Pneumonia Father 62   She indicated that her mother is deceased. She indicated that her father is deceased.  Social History    Social History   Socioeconomic History   Marital status: Widowed    Spouse name: Jayson Burkes   Number of children: 2   Years of education: Not on file   Highest education level: Not on file  Occupational History   Occupation: retired  Tobacco Use   Smoking status: Never    Passive exposure: Yes   Smokeless tobacco: Never  Vaping Use   Vaping status: Never Used  Substance and Sexual Activity   Alcohol use: Yes    Alcohol/week: 3.0 standard drinks of alcohol    Types: 3 Glasses of wine per week    Comment: rare   Drug use: No   Sexual activity: Not on file  Other Topics Concern   Not on file  Social History Narrative   Patient is right-handed. She lives with her husband in a one level home. She does not exercise.   Social Drivers  of Health   Tobacco Use: Medium Risk (11/03/2024)   Patient  History    Smoking Tobacco Use: Never    Smokeless Tobacco Use: Never    Passive Exposure: Yes  Financial Resource Strain: Patient Unable To Answer (04/26/2024)   Received from [facility]   Overall Financial Resource Strain (CARDIA)    How hard is it for you to pay for the very basics like food, housing, medical care, and heating?: Patient unable to answer  Food Insecurity: Medium Risk (10/20/2024)   Received from [facility]   Epic    Within the past 12 months, you worried that your food would run out before you got money to buy more: Sometimes true    Within the past 12 months, the food you bought just didn't last and you didn't have money to get more. : Never true  Transportation Needs: No Transportation Needs (10/20/2024)   Received from [facility]    In the past 12 months, has lack of reliable transportation kept you from medical appointments, meetings, work or from getting things needed for daily living? : No  Physical Activity: Not on file  Stress: Not on file  Social Connections: Moderately Isolated (10/11/2024)   Social Connection and Isolation Panel    Frequency of Communication with Friends and Family: More than three times a week    Frequency of Social Gatherings with Friends and Family: More than three times a week    Attends Religious Services: More than 4 times per year    Active Member of [employer] or Organizations: No    Attends Banker Meetings: Never    Marital Status: Widowed  Intimate Partner Violence: Not At Risk (10/11/2024)   Epic    Fear of Current or Ex-Partner: No    Emotionally Abused: No    Physically Abused: No    Sexually Abused: No  Depression (PHQ2-9): Not on file  Alcohol Screen: Not on file  Housing: Low Risk (10/20/2024)   Received from [facility]   Epic    What is your living situation today?: I have a steady place to live    Think about the place you live. Do you have problems with any of the following? Choose  all that apply:: None/None on this list  Utilities: Low Risk (10/20/2024)   Received from [facility]   Utilities    In the past 12 months has the electric, gas, oil, or water company threatened to shut off services in your home? : No  Health Literacy: Not on file     Review of Systems    General:  No chills, fever, night sweats or weight changes.  Cardiovascular:  No chest pain, dyspnea on exertion, edema, orthopnea, palpitations, paroxysmal nocturnal dyspnea. Dermatological: No rash, lesions/masses Respiratory: No cough, dyspnea Urologic: No hematuria, dysuria Abdominal:   No nausea, vomiting, diarrhea, bright red blood per rectum, melena, or hematemesis Neurologic:  No visual changes, wkns, changes in mental status. All other systems reviewed and are otherwise negative except as noted above.  Physical Exam    VS:  BP (!) 142/80   Pulse 61   Ht 4' 8 (1.422 m)   Wt 160 lb (72.6 kg)   SpO2 97%   BMI 35.87 kg/m  , BMI Body mass index is 35.87 kg/m. GEN: Well nourished, well developed, in no acute distress. HEENT: normal. Neck: Supple, no JVD, carotid bruits, or masses. Cardiac: RRR, no murmurs, rubs, or gallops. No clubbing,  cyanosis, left greater than right generalized nonpitting edema.  Radials/DP/PT 2+ and equal bilaterally.  Respiratory:  Respirations regular and unlabored, clear to auscultation bilaterally. GI: Soft, nontender, nondistended, BS + x 4. MS: no deformity or atrophy. Skin: warm and dry, no rash. Neuro:  Strength and sensation are intact. Psych: Normal affect.  Accessory Clinical Findings    Recent Labs: 11/30/2023: B Natriuretic Peptide 237.4 10/10/2024: Pro Brain Natriuretic Peptide 4,030.0 10/11/2024: ALT 18; Magnesium  2.0; TSH 1.510 10/12/2024: Hemoglobin 11.7; Platelets 293 10/14/2024: BUN 21; Creatinine, Ser 1.01; Potassium 4.1; Sodium 136   Recent Lipid Panel    Component Value Date/Time   CHOL 134 11/14/2015 1004   TRIG 24.0 11/14/2015 1004    HDL 78.70 11/14/2015 1004   CHOLHDL 2 11/14/2015 1004   VLDL 4.8 11/14/2015 1004   LDLCALC 51 11/14/2015 1004    HYPERTENSION CONTROL Vitals:   11/03/24 1438 11/03/24 1459  BP: (!) 166/92 (!) 142/80    The patient's blood pressure is elevated above target today.  In order to address the patient's elevated BP: A new medication was prescribed today.       ECG personally reviewed by me today-none today.  Echocardiogram 12/01/2023  IMPRESSIONS     1. Left ventricular ejection fraction, by estimation, is 55 to 60%. The  left ventricle has normal function. The left ventricle has no regional  wall motion abnormalities. There is mild concentric left ventricular  hypertrophy. Left ventricular diastolic  parameters are indeterminate.   2. Right ventricular systolic function is normal. The right ventricular  size is severely enlarged. Tricuspid regurgitation signal is inadequate  for assessing PA pressure.   3. Left atrial size was mild to moderately dilated.   4. The mitral valve is grossly normal. Mild mitral valve regurgitation.  No evidence of mitral stenosis.   5. The aortic valve is tricuspid. Aortic valve regurgitation is not  visualized. No aortic stenosis is present.   6. Aortic dilatation noted. There is mild dilatation of the ascending  aorta, measuring 40 mm.   7. The inferior vena cava is dilated in size with >50% respiratory  variability, suggesting right atrial pressure of 8 mmHg.   Comparison(s): Aortic dimensions increased from prior.   FINDINGS   Left Ventricle: Left ventricular ejection fraction, by estimation, is 55  to 60%. The left ventricle has normal function. The left ventricle has no  regional wall motion abnormalities. Strain imaging was not performed. The  left ventricular internal cavity   size was normal in size. There is mild concentric left ventricular  hypertrophy. Left ventricular diastolic parameters are indeterminate.   Right Ventricle:  The right ventricular size is severely enlarged. No  increase in right ventricular wall thickness. Right ventricular systolic  function is normal. Tricuspid regurgitation signal is inadequate for  assessing PA pressure.   Left [facility]: Left atrial size was mild to moderately dilated.   Right [facility]: Right atrial size was normal in size.   Pericardium: There is no evidence of pericardial effusion.   Mitral Valve: The mitral valve is grossly normal. Mild mitral valve  regurgitation. No evidence of mitral valve stenosis.   Tricuspid Valve: The tricuspid valve is normal in structure. Tricuspid  valve regurgitation is not demonstrated.   Aortic Valve: The aortic valve is tricuspid. Aortic valve regurgitation is  not visualized. No aortic stenosis is present.   Pulmonic Valve: The pulmonic valve was normal in structure. Pulmonic valve  regurgitation is mild. No evidence of pulmonic stenosis.  Aorta: Aortic dilatation noted. There is mild dilatation of the ascending  aorta, measuring 40 mm.   Venous: The inferior vena cava is dilated in size with greater than 50%  respiratory variability, suggesting right atrial pressure of 8 mmHg.   IAS/Shunts: The atrial septum is grossly normal.   Echocardiogram 10/11/2024  1. Left ventricular ejection fraction, by estimation, is 60 to 65%. Left  ventricular ejection fraction by 2D MOD biplane is 62.1 %. The left  ventricle has normal function. The left ventricle has no regional wall  motion abnormalities. There is mild  concentric left ventricular hypertrophy. Left ventricular diastolic  parameters are consistent with Grade II diastolic dysfunction  (pseudonormalization). Elevated left atrial pressure. The E/e' is 19.   2. Right ventricular systolic function is normal. The right ventricular  size is mildly enlarged. Tricuspid regurgitation signal is inadequate for  assessing PA pressure.   3. The mitral valve is normal in structure. Mild  mitral valve  regurgitation. No evidence of mitral stenosis.   4. The aortic valve is tricuspid. Aortic valve regurgitation is not  visualized. No aortic stenosis is present.   5. Aortic dilatation noted. There is dilatation of the ascending aorta,  measuring 42 mm.   6. The inferior vena cava is dilated in size with >50% respiratory  variability, suggesting right atrial pressure of 8 mmHg.   7. Pleural effusion noted.     Assessment & Plan   1.  Acute on chronic diastolic CHF-weight today 160.4.  Stable.  Echocardiogram 10/11/2024 during recent admission showed an LVEF of 60-65% and G2 DD.  Details above. Heart healthy low-sodium diet-salty 6 diet sheet again reviewed Daily weights-weight log-continue Elevate lower extremities when not active Continue furosemide , Jardiance , Stop losartan  Start Entresto  24/26 Heart healthy low-sodium diet Order CBC, BMP and order BMP in 1 week  Essential hypertension-BP today 142/80. Maintain blood pressure log Continue metoprolol , Start Entresto  24/26 Low-sodium diet  Paroxysmal atrial fibrillation-heart rate today 61.  Denies episodes of accelerated or irregular heartbeat.  Reports compliance with apixaban .  Denies bleeding issues. Continue metoprolol , amiodarone , Eliquis    CKD stage III-creatinine 1.01 on 10/14/24.   Continue to monitor Follows with PCP  Disposition: Follow-up with Dr. Loni or me in 1-2 months.   Josefa HERO. Shizue Kaseman NP-C     11/03/2024, 3:07 PM  [facility] [address] Office [phone] Fax [phone]    I spent 14 minutes examining this patient, reviewing medications, and using patient centered shared decision making involving their cardiac care.   I spent  20 minutes reviewing past medical history,  medications, and prior cardiac tests.  "

## 2024-11-03 ENCOUNTER — Encounter: Payer: Self-pay | Admitting: General Practice

## 2024-11-03 ENCOUNTER — Ambulatory Visit: Admitting: General Practice

## 2024-11-03 VITALS — BP 142/80 | HR 61 | Ht <= 58 in | Wt 160.0 lb

## 2024-11-03 DIAGNOSIS — I5033 Acute on chronic diastolic (congestive) heart failure: Secondary | ICD-10-CM | POA: Diagnosis not present

## 2024-11-03 DIAGNOSIS — I10 Essential (primary) hypertension: Secondary | ICD-10-CM | POA: Diagnosis not present

## 2024-11-03 DIAGNOSIS — I480 Paroxysmal atrial fibrillation: Secondary | ICD-10-CM

## 2024-11-03 LAB — CBC
Hematocrit: 34.1 % (ref 34.0–46.6)
Hemoglobin: 11.1 g/dL (ref 11.1–15.9)
MCH: 27.8 pg (ref 26.6–33.0)
MCHC: 32.6 g/dL (ref 31.5–35.7)
MCV: 85 fL (ref 79–97)
Platelets: 307 x10E3/uL (ref 150–450)
RBC: 4 x10E6/uL (ref 3.77–5.28)
RDW: 14.2 % (ref 11.7–15.4)
WBC: 7.4 x10E3/uL (ref 3.4–10.8)

## 2024-11-03 LAB — BASIC METABOLIC PANEL WITH GFR
BUN/Creatinine Ratio: 21 (ref 12–28)
BUN: 20 mg/dL (ref 8–27)
CO2: 18 mmol/L — ABNORMAL LOW (ref 20–29)
Calcium: 9.3 mg/dL (ref 8.7–10.3)
Chloride: 102 mmol/L (ref 96–106)
Creatinine, Ser: 0.95 mg/dL (ref 0.57–1.00)
Glucose: 88 mg/dL (ref 70–99)
Potassium: 4.5 mmol/L (ref 3.5–5.2)
Sodium: 137 mmol/L (ref 134–144)
eGFR: 59 mL/min/1.73 — ABNORMAL LOW

## 2024-11-03 MED ORDER — METOPROLOL SUCCINATE ER 100 MG PO TB24: 100.0000 mg | ORAL_TABLET | Freq: Every day | ORAL | 2 refills | Status: AC

## 2024-11-03 MED ORDER — SACUBITRIL-VALSARTAN 24-26 MG PO TABS: 1.0000 | ORAL_TABLET | Freq: Two times a day (BID) | ORAL | 2 refills | Status: AC

## 2024-11-03 MED ORDER — FUROSEMIDE 20 MG PO TABS: 20.0000 mg | ORAL_TABLET | ORAL | 0 refills | Status: AC | PRN

## 2024-11-03 MED ORDER — EMPAGLIFLOZIN 10 MG PO TABS: 10.0000 mg | ORAL_TABLET | Freq: Every day | ORAL | 3 refills | Status: AC

## 2024-11-03 NOTE — Patient Instructions (Addendum)
 Medication Instructions:  STOP Losartan   START Entresto  24/26mg  Take 1 tablet twice daily *If you need a refill on your cardiac medications before your next appointment, please call your pharmacy*  Lab Work: TODAY-BMET & CBC  Repeat BMET IN 1 WEEK (11/10/2024) If you have labs (blood work) drawn today and your tests are completely normal, you will receive your results only by: MyChart Message (if you have MyChart) OR A paper copy in the mail If you have any lab test that is abnormal or we need to change your treatment, we will call you to review the results.  Testing/Procedures: NONE ORDERED  Follow-Up: At [facility], you and your health needs are our priority.  As part of our continuing mission to provide you with exceptional heart care, our providers are all part of one team.  This team includes your primary Cardiologist (physician) and Advanced Practice Providers or APPs (Physician Assistants and Nurse Practitioners) who all work together to provide you with the care you need, when you need it.  Your next appointment:   1-2 month(s)  Provider:   Josefa Beauvais, NP  We recommend signing up for the patient portal called MyChart.  Sign up information is provided on this After Visit Summary.  MyChart is used to connect with patients for Virtual Visits (Telemedicine).  Patients are able to view lab/test results, encounter notes, upcoming appointments, etc.  Non-urgent messages can be sent to your provider as well.   To learn more about what you can do with MyChart, go to [email or web address].   Other Instructions

## 2024-11-04 ENCOUNTER — Ambulatory Visit: Payer: Self-pay | Admitting: General Practice

## 2024-11-29 ENCOUNTER — Ambulatory Visit: Admitting: Podiatry

## 2025-01-31 ENCOUNTER — Ambulatory Visit: Admitting: General Practice
